# Patient Record
Sex: Female | Born: 1960 | Race: White | Hispanic: No | Marital: Married | Smoking: Former smoker
Health system: Southern US, Community
[De-identification: ages and names within clinical notes are randomized; demographics above are authoritative.]

## PROBLEM LIST (undated history)

## (undated) DIAGNOSIS — K219 Gastro-esophageal reflux disease without esophagitis: Secondary | ICD-10-CM

## (undated) DIAGNOSIS — F32A Depression, unspecified: Secondary | ICD-10-CM

## (undated) DIAGNOSIS — R351 Nocturia: Secondary | ICD-10-CM

## (undated) DIAGNOSIS — E7800 Pure hypercholesterolemia, unspecified: Secondary | ICD-10-CM

## (undated) DIAGNOSIS — C801 Malignant (primary) neoplasm, unspecified: Secondary | ICD-10-CM

## (undated) DIAGNOSIS — R000 Tachycardia, unspecified: Secondary | ICD-10-CM

## (undated) DIAGNOSIS — J449 Chronic obstructive pulmonary disease, unspecified: Secondary | ICD-10-CM

## (undated) DIAGNOSIS — K589 Irritable bowel syndrome without diarrhea: Secondary | ICD-10-CM

## (undated) DIAGNOSIS — J45909 Unspecified asthma, uncomplicated: Secondary | ICD-10-CM

## (undated) DIAGNOSIS — M797 Fibromyalgia: Secondary | ICD-10-CM

## (undated) DIAGNOSIS — M779 Enthesopathy, unspecified: Secondary | ICD-10-CM

## (undated) DIAGNOSIS — F329 Major depressive disorder, single episode, unspecified: Secondary | ICD-10-CM

## (undated) DIAGNOSIS — M1990 Unspecified osteoarthritis, unspecified site: Secondary | ICD-10-CM

## (undated) HISTORY — PX: ABDOMINAL HYSTERECTOMY: SHX81

## (undated) HISTORY — PX: APPENDECTOMY: SHX54

## (undated) HISTORY — DX: Tachycardia, unspecified: R00.0

## (undated) HISTORY — PX: OTHER SURGICAL HISTORY: SHX169

---

## 2002-02-01 ENCOUNTER — Encounter: Payer: Self-pay | Admitting: Oral Surgery

## 2002-02-04 ENCOUNTER — Ambulatory Visit (HOSPITAL_COMMUNITY): Admission: RE | Admit: 2002-02-04 | Discharge: 2002-02-04 | Payer: Self-pay | Admitting: Oral Surgery

## 2004-02-03 ENCOUNTER — Ambulatory Visit: Payer: Self-pay | Admitting: Psychiatry

## 2004-04-09 ENCOUNTER — Ambulatory Visit: Payer: Self-pay | Admitting: Psychiatry

## 2004-07-15 ENCOUNTER — Ambulatory Visit: Payer: Self-pay | Admitting: Psychiatry

## 2004-09-10 ENCOUNTER — Ambulatory Visit: Payer: Self-pay | Admitting: Psychiatry

## 2004-10-12 ENCOUNTER — Ambulatory Visit: Payer: Self-pay | Admitting: Psychiatry

## 2004-11-18 ENCOUNTER — Ambulatory Visit: Payer: Self-pay | Admitting: Psychiatry

## 2004-12-10 ENCOUNTER — Ambulatory Visit: Payer: Self-pay | Admitting: Psychology

## 2005-02-02 ENCOUNTER — Ambulatory Visit: Payer: Self-pay | Admitting: Internal Medicine

## 2005-02-15 ENCOUNTER — Ambulatory Visit: Payer: Self-pay | Admitting: Internal Medicine

## 2005-02-15 ENCOUNTER — Encounter (INDEPENDENT_AMBULATORY_CARE_PROVIDER_SITE_OTHER): Payer: Self-pay | Admitting: Internal Medicine

## 2005-02-15 ENCOUNTER — Ambulatory Visit (HOSPITAL_COMMUNITY): Admission: RE | Admit: 2005-02-15 | Discharge: 2005-02-15 | Payer: Self-pay | Admitting: Internal Medicine

## 2007-08-09 ENCOUNTER — Other Ambulatory Visit: Payer: Self-pay

## 2007-08-10 ENCOUNTER — Inpatient Hospital Stay (HOSPITAL_COMMUNITY): Admission: AD | Admit: 2007-08-10 | Discharge: 2007-08-15 | Payer: Self-pay | Admitting: *Deleted

## 2007-08-13 ENCOUNTER — Ambulatory Visit: Payer: Self-pay | Admitting: *Deleted

## 2009-07-29 ENCOUNTER — Emergency Department (HOSPITAL_COMMUNITY): Admission: EM | Admit: 2009-07-29 | Discharge: 2009-07-29 | Payer: Self-pay | Admitting: Emergency Medicine

## 2010-06-13 ENCOUNTER — Encounter: Payer: Self-pay | Admitting: Family Medicine
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
## 2010-10-08 NOTE — Consult Note (Signed)
Theresa Pace, Theresa Pace                 ACCOUNT NO.:  [id number]   MEDICAL RECORD NO.:  [id number]          PATIENT TYPE:  AMB   LOCATION:                                FACILITY:  [facility]   PHYSICIAN:  Lionel December, M.D.    DATE OF BIRTH:  1961/05/08   DATE OF CONSULTATION:  02/02/2005  DATE OF DISCHARGE:                                   CONSULTATION   GASTROENTEROLOGY CONSULTATION:   CHIEF COMPLAINT:  Abdominal pain, abdominal swelling, blood in stools.   PHYSICIAN REQUESTING CONSULTATION:  Dr. Donzetta Sprung   HISTORY OF PRESENT ILLNESS:  Theresa Pace is a 50 year old Caucasian female who  presents with several-month history of new abdominal pain, abdominal  swelling, change in bowel movements and two episodes of small volume  hematochezia.  She says that she was diagnosed with IBS at age 25.  Her  typical IBS symptoms consist of postprandial loose stools with fecal  urgency.  Used to have a lower abdominal cramping type pain.  A few months  ago she started having mid abdominal to lower abdominal pain which she  describes as dull with sharp intermittent pains.  Pains are worsened by  meals, however, she also has nocturnal pain which wakes her up from sleep.  Now she is having too numerous stools to count.  She wakes up in the morning  having diarrhea.  She rarely has a formed bowel movement.  She continues to  have fecal urgency with incontinence at times.  She has also developed  nausea but no vomiting.  No melena but two episodes of bright red blood per  rectum.  She had a CT of the abdomen which revealed some fatty liver.  Amylase, lipase, LFTs and CBC unremarkable.  She has never been on any  specific medications for her IBS.  She has chronic back pain and chronic  knee pain due to osteoarthritis and is monitored by pain clinic.  Her pain  medications are provided through pain clinic in [facility].  Even though  she is on oxycodone three times a day she continues to have abdominal  pain.   CURRENT MEDICATIONS:  1.  Ventolin inhaler p.r.n.  2.  Advair b.i.d.  3.  Singulair 10 mg daily.  4.  Vytorin daily.  5.  Detrol 4 mg daily.  6.  Nexium 40 mg daily.  7.  Trazodone 100 mg at bedtime.  8.  Xanax 0.5 mg t.i.d.  9.  Oxycodone 5/500 mg t.i.d.  10. Aspirin 81 mg daily.  11. Ibuprofen p.r.n.   ALLERGIES:  No known drug allergies.   PAST MEDICAL HISTORY:  1.  Asthma.  2.  Hypercholesterolemia.  3.  Tachycardia.  4.  Gastroesophageal reflux disease.  5.  IBS.  6.  Fatty infiltration of the liver.  7.  Osteoarthritis of the knees.  8.  Chronic back pain.   PAST SURGICAL HISTORY:  Status post appendectomy and hysterectomy.   FAMILY HISTORY:  Mother died of female cancer age 64 initially diagnosed at  age 92.  She has three aunts and a  grandmother who all died of various  cancers although she does not have the details.  No family history of colon  cancer to her knowledge.   SOCIAL HISTORY:  She is married and has four sons.  She is disabled.  She  stopped smoking 11 years ago.  Denies any alcohol use.   REVIEW OF SYSTEMS:  GI:  See HPI and also has anorexia and abdominal  swelling.  CARDIOPULMONARY:  Has asthma with intermittent flares.  No chest  pain.  GENITOURINARY:  No dysuria or hematuria.  CONSTITUTIONAL:  Complains  of a 10-pound weight loss.   PHYSICAL EXAMINATION:  VITAL SIGNS:  Weight 203-1/2 pounds, height 5 feet 2  inches, temperature 97.9, blood pressure 128/80, pulse 68.  GENERAL:  Pleasant obese Caucasian female in no acute distress.  SKIN:  Warm and dry.  No jaundice.  HEENT:  Conjunctivae are pink.  Sclerae nonicteric.  Oropharyngeal mucosa  moist and pink.  No lesions, erythema or exudate.  No lymphadenopathy or  thyromegaly.  CHEST:  Lungs are clear to auscultation.  Cardiac exam reveals regular rate  and rhythm, normal S1-S2.  No murmurs, rubs, or gallops.  ABDOMEN:  Positive bowel sounds, soft, nondistended.  Obese but  symmetrical.  She has mild tenderness in the lower mid abdomen to deep palpation.  No  organomegaly or masses.  No rebound tenderness or guarding.  No abdominal  bruits or hernias.  EXTREMITIES:  No edema.  RECTAL:  Rectal examination reveals no external lesions.  Secretions are  Hemoccult negative.  She has several small balls of stool in the rectum.   IMPRESSION:  Theresa Pace is a 50 year old Caucasian female with a history of  chronic gastroesophageal reflux disease and chronic irritable bowel syndrome  who presents for further evaluation of worsening of her abdominal pain,  increased diarrhea, nausea, abdominal swelling, two episodes of small volume  hematochezia.  She feels her symptoms are different from her typical  irritable bowel syndrome.  She had an abdominal CT without any significant  findings except for fatty infiltration of the liver.  She has not had to the  best of my knowledge a pelvic CT.  She has never had a colonoscopy.  Her  abdominal pain and diarrhea may very well end up being due to her irritable  bowel syndrome.  Hematochezia needs to be further evaluated and she will  have a colonoscopy in the near future to do this.  Given her chronic  gastroesophageal reflux disease, she needs to have EGD to rule out  complication such as Barrett's esophagus.   PLAN:  1.  Colonoscopy and EGD in the near future.  Consider random sigmoid and/or      small bowel biopsies.  If endoscopic evaluation is unremarkable would      consider small-bowel follow-through and/or pelvic CT to      further evaluate her symptoms.  2.  Trial of Bentyl 20 mg one p.o. t.i.d. (before every meal) p.r.n. (#90)      with one refill.  3.  Further recommendations to follow.      Tana Coast, P.A.      Lionel December, M.D.  Electronically Signed    LL/MEDQ  D:  02/02/2005  T:  02/02/2005  Job:  [id number]   cc:   Donzetta Sprung  Fax: [phone]

## 2010-10-08 NOTE — Op Note (Signed)
Theresa Pace, Theresa Pace                 ACCOUNT NO.:  [id number]   MEDICAL RECORD NO.:  [id number]          PATIENT TYPE:  AMB   LOCATION:  DAY                           FACILITY:  [facility]   PHYSICIAN:  Lionel December, M.D.    DATE OF BIRTH:  1960/09/08   DATE OF PROCEDURE:  02/15/2005  DATE OF DISCHARGE:                                 OPERATIVE REPORT   PROCEDURE:  Esophagogastroduodenoscopy followed by colonoscopy.   INDICATION:  Sham is a 50 year old Caucasian female with multiple medical  problems who has chronic GERD who is undergoing EGD to make sure she does  not have a large hernia or Barrett's esophagus. She has had two episodes of  hematochezia. She also complains of abdominal bloating, pain and change in  her bowel habits with urgency. Most of her symptoms are felt to be due to  IBS. She is undergoing diagnostic EGD and colonoscopy. Procedure risks were  reviewed with the patient, and informed consent was obtained.   PREMEDICATION:  Cetacaine spray for pharyngeal topical anesthesia, Demerol  50 mg IV, Versed 20 mg IV in divided dose.   FINDINGS:  Procedure performed in endoscopy suite. The patient's vital signs  and O2 saturation were monitored during the procedure and remained stable.   PROCEDURE #1:  Esophagogastroduodenoscopy. The patient was placed in left  lateral position and Olympus videoscope was passed via oropharynx without  any difficulty into esophagus.   Esophagus. Mucosa of the esophagus was normal throughout. Squamocolumnar  junction was at 38 cm from the incisors and was normal.   Stomach. It was empty and distended very well insufflation. Folds of  proximal stomach were normal. Examination of mucosa revealed few antral  erosions and a 6 to 7-mm flat discoid lesion with erosion. This was a biopsy  to make sure this was not an adenoma. Pyloric channel was patent. Angularis,  fundus and cardia were examined by retroflexing the scope and were normal.   Duodenum. Bulbar mucosa revealed patchy edema and erythema and erosions, but  no ulcer crater was found. Scope was passed to the second part of duodenum.  The mucosa and folds were normal. Endoscope was withdrawn, and the patient  prepared for procedure #2.   Colonoscopy. Rectal examination performed. No abnormality noted on external  or digital exam. Olympus videoscope was placed in rectum and advanced under  vision into sigmoid colon and beyond. Preparation was satisfactory. Scope  was passed into cecum which was identified by ileocecal valve and  appendiceal orifice, and pictures taken for the record. As the scope was  withdrawn, colonic mucosa was carefully examined and was normal throughout.  Rectal mucosa similarly was normal. Scope was retroflexed to examine  anorectal junction, and small hemorrhoids were noted below the dentate line.  Endoscope was straightened and withdrawn. The patient tolerated the  procedure well.   FINAL DIAGNOSIS:  1.  Erosive antral gastritis with duodenitis.  2.  Small discoid lesion at antrum with central erosion. Biopsy taken for      histology. This could be a small leiomyoma with erosion or a  hyperplastic polyp.  3.  No evidence of Barrett's esophagus.  4.  Normal colonoscopy except external hemorrhoids.  5.  Suspect most of her symptoms are due to IBS.   RECOMMENDATIONS:  1.  She will continue anti-reflux measures and Nexium as before.  2.  H pylori serology will be checked today.  3.  FiberChoice 2 tablets q.d.  4.  I will be contacting the patient with biopsy results and further      recommendations.      Lionel December, M.D.  Electronically Signed     NR/MEDQ  D:  02/15/2005  T:  02/16/2005  Job:  [id number]   cc:   Donzetta Sprung  Fax: [phone]

## 2010-10-08 NOTE — Op Note (Signed)
   Theresa Pace, Theresa Pace                             ACCOUNT NO.:  [id number]   MEDICAL RECORD NO.:  [id number]                   PATIENT TYPE:  OIB   LOCATION:  2899                                 FACILITY:  [facility]   PHYSICIAN:  Sable Feil., D.D.S.        DATE OF BIRTH:  08/25/1960   DATE OF PROCEDURE:  02/04/2002  DATE OF DISCHARGE:                                 OPERATIVE REPORT   PREOPERATIVE DIAGNOSES:  1. Severe generalized periodontal disease.  2. Multiple abscessed teeth.  3. Dental caries.   POSTOPERATIVE DIAGNOSES:  1. Severe generalized periodontal disease.  2. Multiple abscessed teeth.  3. Dental caries.   PROCEDURE:  Full-mouth dental extractions, including teeth #2, 3, 4, 5, 6,  7, 8, 9, 10, 11, 12, 13, 14, 15, 22, 23, 24, 25, 26, 27, 28, 30, and 31.   DESCRIPTION OF PROCEDURE:  The patient was brought to the operating room in  satisfactory preoperative condition and placed on the operating room table  in the supine position.  Following successful induction of general  anesthesia via oroendotracheal intubation, the patient was prepped and  draped in the usual sterile fashion for a procedure of this type.  Next the  oral cavity was thoroughly irrigated with a normal saline solution.  An  oropharyngeal throat pack was then placed.  Following this, the maxillary  and mandibular soft tissues were injected with 2% lidocaine solution with  1:100,000 epinephrine, approximately 7 cc of local anesthetic was injected  in total.  Next the teeth were removed with surgical technique utilizing a  150 and 151 forceps.  Teeth #2-15, 22-28, 30 and 31 were then surgically  removed.  All extraction sites were thoroughly irrigated with normal saline.  This essentially completed the procedure.  All sponge, needle, and  instrument counts were correct at the conclusion of the case.   SURGEON:  Lovena Le, D.D.S.                                               Sable Feil., D.D.S.    JWB/MEDQ  D:  02/04/2002  T:  02/04/2002  Job:  [id number]

## 2011-02-14 LAB — BASIC METABOLIC PANEL
BUN: 16
CO2: 26
Calcium: 9.6
Chloride: 103
Creatinine, Ser: 0.85
GFR calc Af Amer: >60
GFR calc non Af Amer: >60
Glucose, Bld: 123 — ABNORMAL HIGH
Potassium: 3.9
Sodium: 138

## 2011-02-14 LAB — URINALYSIS, ROUTINE W REFLEX MICROSCOPIC
Glucose, UA: NEGATIVE
Nitrite: NEGATIVE
Specific Gravity, Urine: >1.03 — ABNORMAL HIGH
Urobilinogen, UA: 0.2
pH: 5.5

## 2011-02-14 LAB — DIFFERENTIAL
Basophils Absolute: 0
Basophils Relative: 0
Eosinophils Absolute: 0.1
Eosinophils Relative: 1
Lymphocytes Relative: 23
Lymphs Abs: 2.7
Monocytes Absolute: 0.4
Monocytes Relative: 4
Neutro Abs: 8.6 — ABNORMAL HIGH
Neutrophils Relative %: 73

## 2011-02-14 LAB — CBC
HCT: 45.6
Hemoglobin: 15.7 — ABNORMAL HIGH
MCHC: 34.4
MCV: 88.9
Platelets: 331
RBC: 5.13 — ABNORMAL HIGH
RDW: 13.4
WBC: 11.9 — ABNORMAL HIGH

## 2011-02-14 LAB — URINE MICROSCOPIC-ADD ON

## 2011-02-14 LAB — RAPID URINE DRUG SCREEN, HOSP PERFORMED
Amphetamines: POSITIVE — AB
Barbiturates: NOT DETECTED
Benzodiazepines: POSITIVE — AB
Cocaine: NOT DETECTED
Opiates: POSITIVE — AB
Tetrahydrocannabinol: NOT DETECTED

## 2011-02-14 LAB — HEMOGLOBIN A1C: Hgb A1c MFr Bld: 6.1

## 2011-11-21 ENCOUNTER — Encounter: Payer: Self-pay | Admitting: Internal Medicine

## 2012-02-20 ENCOUNTER — Other Ambulatory Visit: Payer: Self-pay

## 2012-02-20 ENCOUNTER — Encounter (HOSPITAL_COMMUNITY): Payer: Self-pay | Admitting: Emergency Medicine

## 2012-02-20 ENCOUNTER — Inpatient Hospital Stay (HOSPITAL_COMMUNITY)
Admission: EM | Admit: 2012-02-20 | Discharge: 2012-02-22 | DRG: 100 | Disposition: A | Discharge status: Home / Self Care | Payer: Medicaid Other | Attending: Internal Medicine | Admitting: Internal Medicine

## 2012-02-20 DIAGNOSIS — K649 Unspecified hemorrhoids: Secondary | ICD-10-CM | POA: Diagnosis present

## 2012-02-20 DIAGNOSIS — M549 Dorsalgia, unspecified: Secondary | ICD-10-CM | POA: Diagnosis present

## 2012-02-20 DIAGNOSIS — T428X5A Adverse effect of antiparkinsonism drugs and other central muscle-tone depressants, initial encounter: Secondary | ICD-10-CM | POA: Diagnosis present

## 2012-02-20 DIAGNOSIS — G9340 Encephalopathy, unspecified: Secondary | ICD-10-CM | POA: Diagnosis present

## 2012-02-20 DIAGNOSIS — Z886 Allergy status to analgesic agent status: Secondary | ICD-10-CM

## 2012-02-20 DIAGNOSIS — F329 Major depressive disorder, single episode, unspecified: Secondary | ICD-10-CM | POA: Diagnosis present

## 2012-02-20 DIAGNOSIS — R32 Unspecified urinary incontinence: Secondary | ICD-10-CM | POA: Diagnosis present

## 2012-02-20 DIAGNOSIS — R4182 Altered mental status, unspecified: Secondary | ICD-10-CM

## 2012-02-20 DIAGNOSIS — F32A Depression, unspecified: Secondary | ICD-10-CM | POA: Diagnosis present

## 2012-02-20 DIAGNOSIS — R569 Unspecified convulsions: Principal | ICD-10-CM | POA: Diagnosis present

## 2012-02-20 DIAGNOSIS — K219 Gastro-esophageal reflux disease without esophagitis: Secondary | ICD-10-CM | POA: Diagnosis present

## 2012-02-20 DIAGNOSIS — F3289 Other specified depressive episodes: Secondary | ICD-10-CM | POA: Diagnosis present

## 2012-02-20 DIAGNOSIS — E7800 Pure hypercholesterolemia, unspecified: Secondary | ICD-10-CM | POA: Diagnosis present

## 2012-02-20 DIAGNOSIS — J45909 Unspecified asthma, uncomplicated: Secondary | ICD-10-CM | POA: Diagnosis present

## 2012-02-20 DIAGNOSIS — Y92009 Unspecified place in unspecified non-institutional (private) residence as the place of occurrence of the external cause: Secondary | ICD-10-CM

## 2012-02-20 DIAGNOSIS — F17200 Nicotine dependence, unspecified, uncomplicated: Secondary | ICD-10-CM | POA: Diagnosis present

## 2012-02-20 DIAGNOSIS — G8929 Other chronic pain: Secondary | ICD-10-CM | POA: Diagnosis present

## 2012-02-20 DIAGNOSIS — K589 Irritable bowel syndrome without diarrhea: Secondary | ICD-10-CM | POA: Diagnosis present

## 2012-02-20 HISTORY — DX: Major depressive disorder, single episode, unspecified: F32.9

## 2012-02-20 HISTORY — DX: Gastro-esophageal reflux disease without esophagitis: K21.9

## 2012-02-20 HISTORY — DX: Depression, unspecified: F32.A

## 2012-02-20 HISTORY — DX: Unspecified asthma, uncomplicated: J45.909

## 2012-02-20 HISTORY — DX: Pure hypercholesterolemia, unspecified: E78.00

## 2012-02-20 HISTORY — DX: Nocturia: R35.1

## 2012-02-20 HISTORY — DX: Irritable bowel syndrome, unspecified: K58.9

## 2012-02-20 NOTE — ED Notes (Signed)
Patient with altered mental status. Patient unable to answer questions at this time. Patient repeating name, shaking, eyes dilated and continuously moving.

## 2012-02-21 ENCOUNTER — Inpatient Hospital Stay (HOSPITAL_COMMUNITY)
Admit: 2012-02-21 | Discharge: 2012-02-21 | Disposition: A | Discharge status: Home / Self Care | Payer: Medicaid Other | Attending: Internal Medicine | Admitting: Internal Medicine

## 2012-02-21 ENCOUNTER — Emergency Department (HOSPITAL_COMMUNITY): Payer: Medicaid Other

## 2012-02-21 ENCOUNTER — Encounter (HOSPITAL_COMMUNITY): Payer: Self-pay | Admitting: Emergency Medicine

## 2012-02-21 ENCOUNTER — Inpatient Hospital Stay (HOSPITAL_COMMUNITY): Payer: Medicaid Other

## 2012-02-21 DIAGNOSIS — J45909 Unspecified asthma, uncomplicated: Secondary | ICD-10-CM

## 2012-02-21 DIAGNOSIS — F329 Major depressive disorder, single episode, unspecified: Secondary | ICD-10-CM

## 2012-02-21 DIAGNOSIS — R4182 Altered mental status, unspecified: Secondary | ICD-10-CM

## 2012-02-21 DIAGNOSIS — G9340 Encephalopathy, unspecified: Secondary | ICD-10-CM

## 2012-02-21 DIAGNOSIS — R569 Unspecified convulsions: Principal | ICD-10-CM

## 2012-02-21 LAB — CBC
HCT: 47.6 % — ABNORMAL HIGH (ref 36.0–46.0)
Hemoglobin: 16.4 g/dL — ABNORMAL HIGH (ref 12.0–15.0)
MCH: 31.6 pg (ref 26.0–34.0)
MCHC: 34.5 g/dL (ref 30.0–36.0)
MCV: 91.7 fL (ref 78.0–100.0)
RBC: 5.19 MIL/uL — ABNORMAL HIGH (ref 3.87–5.11)

## 2012-02-21 LAB — ETHANOL: Alcohol, Ethyl (B): <11 mg/dL (ref 0–11)

## 2012-02-21 LAB — PROLACTIN: Prolactin: 4.5 ng/mL

## 2012-02-21 LAB — HEPATIC FUNCTION PANEL
ALT: 10 U/L (ref 0–35)
Alkaline Phosphatase: 107 U/L (ref 39–117)
Total Bilirubin: 0.4 mg/dL (ref 0.3–1.2)
Total Protein: 7 g/dL (ref 6.0–8.3)

## 2012-02-21 LAB — RAPID URINE DRUG SCREEN, HOSP PERFORMED
Amphetamines: POSITIVE — AB
Barbiturates: NOT DETECTED
Benzodiazepines: POSITIVE — AB
Tetrahydrocannabinol: NOT DETECTED

## 2012-02-21 LAB — URINALYSIS, MICROSCOPIC ONLY
Glucose, UA: NEGATIVE mg/dL
Ketones, ur: NEGATIVE mg/dL
Leukocytes, UA: NEGATIVE
Nitrite: NEGATIVE
Protein, ur: NEGATIVE mg/dL
Urobilinogen, UA: 0.2 mg/dL (ref 0.0–1.0)

## 2012-02-21 LAB — BASIC METABOLIC PANEL
BUN: 15 mg/dL (ref 6–23)
CO2: 21 mEq/L (ref 19–32)
GFR calc non Af Amer: >90 mL/min (ref >90)
Glucose, Bld: 114 mg/dL — ABNORMAL HIGH (ref 70–99)
Potassium: 3.6 mEq/L (ref 3.5–5.1)
Sodium: 141 mEq/L (ref 135–145)

## 2012-02-21 MED ORDER — DULOXETINE HCL 30 MG PO CPEP
30.0000 mg | ORAL_CAPSULE | Freq: Every day | ORAL | Status: DC
  Administered 2012-02-21 – 2012-02-22 (×2): 30 mg via ORAL
  Filled 2012-02-21 (×4): qty 1

## 2012-02-21 MED ORDER — ENOXAPARIN SODIUM 40 MG/0.4ML ~~LOC~~ SOLN
40.0000 mg | SUBCUTANEOUS | Status: DC
  Administered 2012-02-21 – 2012-02-22 (×2): 40 mg via SUBCUTANEOUS
  Filled 2012-02-21 (×2): qty 0.4

## 2012-02-21 MED ORDER — ACETAMINOPHEN 325 MG PO TABS
650.0000 mg | ORAL_TABLET | Freq: Four times a day (QID) | ORAL | Status: DC | PRN
  Administered 2012-02-21 (×2): 650 mg via ORAL
  Filled 2012-02-21 (×2): qty 2

## 2012-02-21 MED ORDER — PNEUMOCOCCAL VAC POLYVALENT 25 MCG/0.5ML IJ INJ
0.5000 mL | INJECTION | INTRAMUSCULAR | Status: AC
  Administered 2012-02-22: 0.5 mL via INTRAMUSCULAR
  Filled 2012-02-21 (×2): qty 0.5

## 2012-02-21 MED ORDER — THIAMINE HCL 100 MG/ML IJ SOLN
100.0000 mg | Freq: Every day | INTRAMUSCULAR | Status: DC
  Administered 2012-02-21 – 2012-02-22 (×2): 100 mg via INTRAVENOUS
  Filled 2012-02-21 (×2): qty 2

## 2012-02-21 MED ORDER — INFLUENZA VIRUS VACC SPLIT PF IM SUSP
0.5000 mL | INTRAMUSCULAR | Status: AC
  Administered 2012-02-22: 0.5 mL via INTRAMUSCULAR
  Filled 2012-02-21: qty 0.5

## 2012-02-21 MED ORDER — ONDANSETRON HCL 4 MG/2ML IJ SOLN: 4.0000 mg | Freq: Four times a day (QID) | INTRAMUSCULAR | Status: DC | PRN

## 2012-02-21 MED ORDER — FLUTICASONE-SALMETEROL 250-50 MCG/DOSE IN AEPB
1.0000 | INHALATION_SPRAY | Freq: Two times a day (BID) | RESPIRATORY_TRACT | Status: DC
  Administered 2012-02-21 – 2012-02-22 (×3): 1 via RESPIRATORY_TRACT
  Filled 2012-02-21: qty 14

## 2012-02-21 MED ORDER — ATORVASTATIN CALCIUM 40 MG PO TABS
40.0000 mg | ORAL_TABLET | Freq: Every day | ORAL | Status: DC
  Administered 2012-02-21: 40 mg via ORAL
  Filled 2012-02-21: qty 1

## 2012-02-21 MED ORDER — ALPRAZOLAM 0.5 MG PO TABS
0.5000 mg | ORAL_TABLET | Freq: Three times a day (TID) | ORAL | Status: DC | PRN
  Administered 2012-02-21 – 2012-02-22 (×2): 0.5 mg via ORAL
  Filled 2012-02-21 (×2): qty 1

## 2012-02-21 MED ORDER — ALBUTEROL SULFATE HFA 108 (90 BASE) MCG/ACT IN AERS: 2.0000 | INHALATION_SPRAY | Freq: Four times a day (QID) | RESPIRATORY_TRACT | Status: DC | PRN

## 2012-02-21 MED ORDER — PANTOPRAZOLE SODIUM 40 MG PO TBEC
40.0000 mg | DELAYED_RELEASE_TABLET | Freq: Every day | ORAL | Status: DC
  Administered 2012-02-21 – 2012-02-22 (×2): 40 mg via ORAL
  Filled 2012-02-21 (×2): qty 1

## 2012-02-21 MED ORDER — ACETAMINOPHEN 650 MG RE SUPP: 650.0000 mg | Freq: Four times a day (QID) | RECTAL | Status: DC | PRN

## 2012-02-21 MED ORDER — SODIUM CHLORIDE 0.9 % IV BOLUS (SEPSIS)
500.0000 mL | Freq: Once | INTRAVENOUS | Status: AC
  Administered 2012-02-22: 500 mL via INTRAVENOUS

## 2012-02-21 MED ORDER — SODIUM CHLORIDE 0.9 % IJ SOLN
3.0000 mL | Freq: Two times a day (BID) | INTRAMUSCULAR | Status: DC
  Administered 2012-02-21 – 2012-02-22 (×3): 3 mL via INTRAVENOUS
  Filled 2012-02-21 (×3): qty 3

## 2012-02-21 MED ORDER — ONDANSETRON HCL 4 MG PO TABS: 4.0000 mg | ORAL_TABLET | Freq: Four times a day (QID) | ORAL | Status: DC | PRN

## 2012-02-21 MED ORDER — IBUPROFEN 600 MG PO TABS
600.0000 mg | ORAL_TABLET | Freq: Once | ORAL | Status: AC
  Administered 2012-02-21: 600 mg via ORAL
  Filled 2012-02-21: qty 1

## 2012-02-21 MED ORDER — METHOCARBAMOL 500 MG PO TABS
750.0000 mg | ORAL_TABLET | Freq: Four times a day (QID) | ORAL | Status: DC
  Administered 2012-02-21: 750 mg via ORAL
  Filled 2012-02-21 (×2): qty 2

## 2012-02-21 MED ORDER — ALBUTEROL SULFATE (5 MG/ML) 0.5% IN NEBU
2.5000 mg | INHALATION_SOLUTION | RESPIRATORY_TRACT | Status: DC | PRN
  Filled 2012-02-21: qty 0.5

## 2012-02-21 MED ORDER — SODIUM CHLORIDE 0.9 % IV SOLN
INTRAVENOUS | Status: AC
  Administered 2012-02-21: 10:00:00 via INTRAVENOUS

## 2012-02-21 NOTE — ED Notes (Signed)
Assessed patient's orientation again. Patient able to tell me she is in the hospital when I assessed orientation to place. Patient able to tell me name, stated age was 70. Patient disoriented to time and situation.

## 2012-02-21 NOTE — H&P (Signed)
Theresa Pace is an 51 y.o. female.    PCP: Unknown  Chief Complaint: Altered mental status, possible seizure  HPI: This is a 51 year old, Caucasian female, with a past medical history of depression and possibly bipolar disorder, as well as asthma, who was in her usual state of health till sometime last night when she was found by her husband to be shaking. He felt that the patient had a seizure. He, subsequently called EMS and the patient was brought into the hospital. Patient was monitored in the ED and initially, it was felt that she had a post ictal state. However, her confusion persisted for many hours and, so, we were called for further evaluation. Unfortunately, patient's husband cannot be traced at this time. Patient is unable to answer to many questions. She complains of low back pain, which she tells me is chronic for her. She denies any chest pain. Denies headache. No recent history of fever or chills. No history of falls. She denies taking none more than usual quantities of her home medications. History is very limited at this time   Home Medications: Prior to Admission medications   Medication Sig Start Date End Date Taking? Authorizing Provider  albuterol (PROVENTIL HFA;VENTOLIN HFA) 108 (90 BASE) MCG/ACT inhaler Inhale 2 puffs into the lungs every 6 (six) hours as needed.   Yes Historical Provider, MD  baclofen (LIORESAL) 20 MG tablet Take 20 mg by mouth 3 (three) times daily.   Yes Historical Provider, MD  DULoxetine (CYMBALTA) 30 MG capsule Take 30 mg by mouth daily.   Yes Historical Provider, MD  Fluticasone-Salmeterol (ADVAIR) 250-50 MCG/DOSE AEPB Inhale 1 puff into the lungs every 12 (twelve) hours.   Yes Historical Provider, MD  loratadine (CLARITIN) 10 MG tablet Take 10 mg by mouth daily.   Yes Historical Provider, MD  montelukast (SINGULAIR) 10 MG tablet Take 10 mg by mouth at bedtime.   Yes Historical Provider, MD  omeprazole (PRILOSEC) 20 MG capsule Take 20 mg by mouth  daily.   Yes Historical Provider, MD  ondansetron (ZOFRAN) 4 MG tablet Take 4 mg by mouth every 8 (eight) hours as needed.   Yes Historical Provider, MD  QUEtiapine (SEROQUEL XR) 200 MG 24 hr tablet Take 150 mg by mouth at bedtime.   Yes Historical Provider, MD  rosuvastatin (CRESTOR) 20 MG tablet Take 20 mg by mouth daily.   Yes Historical Provider, MD  traZODone (DESYREL) 50 MG tablet Take 100 mg by mouth at bedtime.   Yes Historical Provider, MD    Allergies:  Allergies  Allergen Reactions  . Aspirin Shortness Of Breath and Rash    Past Medical History: Past Medical History  Diagnosis Date  . Asthma   . Depression   . Frequent urination at night   . IBS (irritable bowel syndrome)   . GERD (gastroesophageal reflux disease)   . Hypercholesteremia     Past Surgical History  Procedure Date  . Abdominal hysterectomy     Social History:  reports that she has been smoking.  She does not have any smokeless tobacco history on file. She reports that she drinks alcohol. She reports that she does not use illicit drugs.  Family History: Unable to obtain due to altered mental status  Review of Systems -unable to do due to altered mental status  Physical Examination Blood pressure 155/98, pulse 80, temperature 98.3 F (36.8 C), temperature source Oral, resp. rate 20, height 5\' 2"  (1.575 m), weight 70.308 kg (155 lb),  SpO2 99.00%.  General appearance: alert, distracted, no distress and slowed mentation Head: Normocephalic, without obvious abnormality, atraumatic Eyes: conjunctivae/corneas clear. PERRL, EOM's intact.  Throat: lips, mucosa, and tongue normal; teeth and gums normal Neck: no adenopathy, no carotid bruit, no JVD, supple, symmetrical, trachea midline and thyroid not enlarged, symmetric, no tenderness/mass/nodules Back: symmetric, no curvature. ROM normal. No CVA tenderness. Resp: Few scattered wheezes bilaterally. No crackles Cardio: regular rate and rhythm, S1, S2 normal,  no murmur, click, rub or gallop GI: soft, non-tender; bowel sounds normal; no masses,  no organomegaly Extremities: extremities normal, atraumatic, no cyanosis or edema Pulses: 2+ and symmetric Skin: Skin color, texture, turgor normal. No rashes or lesions Lymph nodes: Cervical, supraclavicular, and axillary nodes normal. Neurologic: She is alert. She was able to tell me that she was in the hospital. She was able to name the hospital. She did not know today's date. She does not have any cranial nerve deficits. Motor strength is equal bilaterally. Gait was not assessed  Laboratory Data: Results for orders placed during the hospital encounter of 02/20/12 (from the past 48 hour(s))  BASIC METABOLIC PANEL     Status: Abnormal   Collection Time   02/20/12 11:54 PM      Component Value Range Comment   Sodium 141  135 - 145 mEq/L    Potassium 3.6  3.5 - 5.1 mEq/L    Chloride 108  96 - 112 mEq/L    CO2 21  19 - 32 mEq/L    Glucose, Bld 114 (*) 70 - 99 mg/dL    BUN 15  6 - 23 mg/dL    Creatinine, Ser 1.61  0.50 - 1.10 mg/dL    Calcium 9.6  8.4 - 09.6 mg/dL    GFR calc non Af Amer >90  >90 mL/min    GFR calc Af Amer >90  >90 mL/min   CBC     Status: Abnormal   Collection Time   02/20/12 11:54 PM      Component Value Range Comment   WBC 10.5  4.0 - 10.5 K/uL    RBC 5.19 (*) 3.87 - 5.11 MIL/uL    Hemoglobin 16.4 (*) 12.0 - 15.0 g/dL    HCT 04.5 (*) 40.9 - 46.0 %    MCV 91.7  78.0 - 100.0 fL    MCH 31.6  26.0 - 34.0 pg    MCHC 34.5  30.0 - 36.0 g/dL    RDW 81.1  91.4 - 78.2 %    Platelets 243  150 - 400 K/uL   ETHANOL     Status: Normal   Collection Time   02/20/12 11:54 PM      Component Value Range Comment   Alcohol, Ethyl (B) <11  0 - 11 mg/dL   URINE RAPID DRUG SCREEN (HOSP PERFORMED)     Status: Abnormal   Collection Time   02/21/12 12:55 AM      Component Value Range Comment   Opiates NONE DETECTED  NONE DETECTED    Cocaine NONE DETECTED  NONE DETECTED    Benzodiazepines POSITIVE  (*) NONE DETECTED    Amphetamines POSITIVE (*) NONE DETECTED    Tetrahydrocannabinol NONE DETECTED  NONE DETECTED    Barbiturates NONE DETECTED  NONE DETECTED   URINALYSIS, MICROSCOPIC ONLY     Status: Abnormal   Collection Time   02/21/12 12:55 AM      Component Value Range Comment   Color, Urine YELLOW  YELLOW    APPearance  CLEAR  CLEAR    Specific Gravity, Urine <1.005 (*) 1.005 - 1.030    pH 6.0  5.0 - 8.0    Glucose, UA NEGATIVE  NEGATIVE mg/dL    Hgb urine dipstick SMALL (*) NEGATIVE    Bilirubin Urine NEGATIVE  NEGATIVE    Ketones, ur NEGATIVE  NEGATIVE mg/dL    Protein, ur NEGATIVE  NEGATIVE mg/dL    Urobilinogen, UA 0.2  0.0 - 1.0 mg/dL    Nitrite NEGATIVE  NEGATIVE    Leukocytes, UA NEGATIVE  NEGATIVE    WBC, UA 0-2  <3 WBC/hpf    RBC / HPF 0-2  <3 RBC/hpf    Squamous Epithelial / LPF RARE  RARE   HEPATIC FUNCTION PANEL     Status: Normal   Collection Time   02/21/12  1:33 AM      Component Value Range Comment   Total Protein 7.0  6.0 - 8.3 g/dL    Albumin 3.8  3.5 - 5.2 g/dL    AST 10  0 - 37 U/L    ALT 10  0 - 35 U/L    Alkaline Phosphatase 107  39 - 117 U/L    Total Bilirubin 0.4  0.3 - 1.2 mg/dL    Bilirubin, Direct <9.6  0.0 - 0.3 mg/dL    Indirect Bilirubin NOT CALCULATED  0.3 - 0.9 mg/dL   AMMONIA     Status: Normal   Collection Time   02/21/12  1:34 AM      Component Value Range Comment   Ammonia 21  11 - 60 umol/L     Radiology Reports: Dg Chest 1 View  02/21/2012  *RADIOLOGY REPORT*  Clinical Data: Altered mental status.  Seizure.  CHEST - 1 VIEW  Comparison: 01/20/2012  Findings: The lungs are clear without focal infiltrate, edema, pneumothorax or pleural effusion. Interstitial markings are diffusely coarsened with chronic features. Cardiopericardial silhouette is at upper limits of normal for size. Imaged bony structures of the thorax are intact. Telemetry leads overlie the chest.  IMPRESSION: Underlying chronic interstitial coarsening.  No acute  cardiopulmonary process.   Original Report Authenticated By: ERIC A. MANSELL, M.D.    Ct Head Wo Contrast  02/21/2012  *RADIOLOGY REPORT*  Clinical Data: Altered mental status.  Seizure activity.  CT HEAD WITHOUT CONTRAST  Technique:  Contiguous axial images were obtained from the base of the skull through the vertex without contrast.  Comparison: None.  Findings: There is no evidence for acute hemorrhage, hydrocephalus, mass lesion, or abnormal extra-axial fluid collection.  No definite CT evidence for acute infarction.  The visualized paranasal sinuses and mastoid air cells are clear.  IMPRESSION: Normal exam.   Original Report Authenticated By: ERIC A. MANSELL, M.D.     Electrocardiogram: Poor quality EKG, difficult to interpret.  Assessment/Plan  Principal Problem:  *Acute encephalopathy Active Problems:  Asthma  Depression   #1 acute encephalopathy: She may have had a seizure and this could be a prolonged postictal state. These findings could also be secondary to medications, that she is on at home. We will get an EEG. We'll get MRI of the brain. Observe her in the hospital overnight. Seizure precautions will be utilized. There is no obvious infectious source for her symptoms and presentation. No metabolic source either. We will hold some of her home medications.  #2 history of asthma: She does have a cough. However, x-ray is clear. Continue to monitor.  DVT, prophylaxis will be initiated.  She is a  full code.  Further management and decisions will depend on results of further testing and patient's response to treatment.   [facility]  [facility] Pager [phone]  02/21/2012, 5:24 AM

## 2012-02-21 NOTE — Evaluation (Signed)
Occupational Therapy Evaluation Patient Details Name: Theresa Pace MRN: [id number] DOB: 03/22/1961 Today's Date: 02/21/2012 Time: 1355-1410 OT Time Calculation (min): 15 min  OT Assessment / Plan / Recommendation Clinical Impression  Patient is a 51 y/o female s/p Acute Encephalopathy presenting to acute OT with all education complete. Patient is functioning at baseline and does not require OT services at this time; will sign off.    OT Assessment  Patient does not need any further OT services    Follow Up Recommendations  No OT follow up       Equipment Recommendations  None recommended by OT          Precautions / Restrictions Precautions Precautions: None Restrictions Weight Bearing Restrictions: No   Pertinent Vitals/Pain Patient reports 10/10 pain level in back. Nursing aware.    ADL  Lower Body Dressing: Independent Where Assessed - Lower Body Dressing: Unsupported sit to stand Toilet Transfer: Independent Toilet Transfer Method: Other (comment) (ambulating) Toilet Transfer Equipment: Regular height toilet Toileting - Clothing Manipulation and Hygiene: Independent Where Assessed - Toileting Clothing Manipulation and Hygiene: Standing Transfers/Ambulation Related to ADLs: Patient transfers at Independent level; no AD.      Visit Information  Last OT Received On: 02/21/12 Assistance Needed: +1    Subjective Data  Subjective: "My back hurts." Patient Stated Goal: To go home.   Prior Functioning     Home Living Lives With: Spouse Prior Function Level of Independence: Independent Driving: Yes Vocation: On disability Communication Communication: No difficulties Dominant Hand: Right            Cognition  Overall Cognitive Status: Appears within functional limits for tasks assessed/performed Arousal/Alertness: Awake/alert Orientation Level: Appears intact for tasks assessed Behavior During Session: [facility] for tasks performed    Extremity/Trunk  Assessment Right Upper Extremity Assessment RUE ROM/Strength/Tone: WFL for tasks assessed RUE Sensation: WFL - Light Touch;WFL - Proprioception RUE Coordination: WFL - gross/fine motor Left Upper Extremity Assessment LUE ROM/Strength/Tone: WFL for tasks assessed LUE Sensation: WFL - Light Touch;WFL - Proprioception LUE Coordination: WFL - gross/fine motor     Mobility Bed Mobility Bed Mobility: Supine to Sit;Sit to Supine Supine to Sit: 7: Independent Sit to Supine: 7: Independent Transfers Sit to Stand: 7: Independent Stand to Sit: 7: Independent           Balance Balance Balance Assessed: No (WNL by observation)   End of Session OT - End of Session Activity Tolerance: Patient tolerated treatment well Patient left: in bed;with call bell/phone within reach;with nursing in room Nurse Communication: Patient requests pain meds;Mobility status   Limmie Patricia, OTR/L 02/21/2012, 2:28 PM

## 2012-02-21 NOTE — ED Provider Notes (Addendum)
History     CSN: [id number]  Arrival date & time 02/20/12  2345   First MD Initiated Contact with Patient 02/20/12 2350    Chief Complaint  Patient presents with  . Altered Mental Status    (Consider location/radiation/quality/duration/timing/severity/associated sxs/prior treatment) HPI Level 5 Caveat: altered mental status. This is a 51 year old white female who was found by her husband had a generalized tonic clonic movement while sitting on the sofa at home. This occurred just prior to arrival. She has no history of seizures. This lasted for several minutes and was followed by confusion which persists. She was able to tell nursing staff her name on arrival but did not know where she was, what day it was, and gave her age as just 89. She was incontinent of urine. On arrival she was fidgety and repeating her name although this has improved. Her husband states she has chronic abdominal pain.  Past Medical History  Diagnosis Date  . Asthma   . Depression   . Frequent urination at night   . IBS (irritable bowel syndrome)   . GERD (gastroesophageal reflux disease)   . Hypercholesteremia     Past Surgical History  Procedure Date  . Abdominal hysterectomy     History reviewed. No pertinent family history.  History  Substance Use Topics  . Smoking status: Current Every Day Smoker  . Smokeless tobacco: Not on file  . Alcohol Use: Yes     sledom per husband    OB History    Grav Para Term Preterm Abortions TAB SAB Ect Mult Living                  Review of Systems  Unable to perform ROS   Allergies  Aspirin  Home Medications   Current Outpatient Rx  Name Route Sig Dispense Refill  . ALBUTEROL SULFATE HFA 108 (90 BASE) MCG/ACT IN AERS Inhalation Inhale 2 puffs into the lungs every 6 (six) hours as needed.    Marland Kitchen BACLOFEN 20 MG PO TABS Oral Take 20 mg by mouth 3 (three) times daily.    . DULOXETINE HCL 30 MG PO CPEP Oral Take 30 mg by mouth daily.    Marland Kitchen  FLUTICASONE-SALMETEROL 250-50 MCG/DOSE IN AEPB Inhalation Inhale 1 puff into the lungs every 12 (twelve) hours.    Marland Kitchen LORATADINE 10 MG PO TABS Oral Take 10 mg by mouth daily.    Marland Kitchen MONTELUKAST SODIUM 10 MG PO TABS Oral Take 10 mg by mouth at bedtime.    . OMEPRAZOLE 20 MG PO CPDR Oral Take 20 mg by mouth daily.    Marland Kitchen ONDANSETRON HCL 4 MG PO TABS Oral Take 4 mg by mouth every 8 (eight) hours as needed.    Marland Kitchen QUETIAPINE FUMARATE ER 200 MG PO TB24 Oral Take 150 mg by mouth at bedtime.    Marland Kitchen ROSUVASTATIN CALCIUM 20 MG PO TABS Oral Take 20 mg by mouth daily.    . TRAZODONE HCL 50 MG PO TABS Oral Take 100 mg by mouth at bedtime.      BP 152/75  Pulse 60  Temp 98.3 F (36.8 C) (Oral)  Resp 16  Ht 5\' 2"  (1.575 m)  Wt 155 lb (70.308 kg)  BMI 28.35 kg/m2  SpO2 96%  Physical Exam General: Well-developed, well-nourished female in no acute distress; appearance consistent with age of record HENT: normocephalic, atraumatic Eyes: pupils equal round and reactive to light; extraocular muscles intact Neck: supple Heart: regular rate and rhythm; normal  sinus rhythm on monitor with occasional ectopy Lungs: clear to auscultation bilaterally Abdomen: soft; nondistended; mild generalized tenderness; no masses or hepatosplenomegaly; bowel sounds present Extremities: No deformity; full range of motion Neurologic: Awake, alert, can get her name, knows she is in a hospital but cannot name which one, not oriented to time or date, not oriented to her age; motor function intact in all extremities and symmetric but with asterixis like movements are and difficulty raising just one limb at a time; no facial droop Skin: Warm and dry Psychiatric: flat affect    ED Course  Procedures (including critical care time)     MDM   Nursing notes and vitals signs, including pulse oximetry, reviewed.  Summary of this visit's results, reviewed by myself:  Labs:  Results for orders placed during the hospital encounter of  02/20/12  BASIC METABOLIC PANEL      Component Value Range   Sodium 141  135 - 145 mEq/L   Potassium 3.6  3.5 - 5.1 mEq/L   Chloride 108  96 - 112 mEq/L   CO2 21  19 - 32 mEq/L   Glucose, Bld 114 (*) 70 - 99 mg/dL   BUN 15  6 - 23 mg/dL   Creatinine, Ser 4.54  0.50 - 1.10 mg/dL   Calcium 9.6  8.4 - 09.8 mg/dL   GFR calc non Af Amer >90  >90 mL/min   GFR calc Af Amer >90  >90 mL/min  CBC      Component Value Range   WBC 10.5  4.0 - 10.5 K/uL   RBC 5.19 (*) 3.87 - 5.11 MIL/uL   Hemoglobin 16.4 (*) 12.0 - 15.0 g/dL   HCT 11.9 (*) 14.7 - 82.9 %   MCV 91.7  78.0 - 100.0 fL   MCH 31.6  26.0 - 34.0 pg   MCHC 34.5  30.0 - 36.0 g/dL   RDW 56.2  13.0 - 86.5 %   Platelets 243  150 - 400 K/uL  ETHANOL      Component Value Range   Alcohol, Ethyl (B) <11  0 - 11 mg/dL  URINE RAPID DRUG SCREEN (HOSP PERFORMED)      Component Value Range   Opiates NONE DETECTED  NONE DETECTED   Cocaine NONE DETECTED  NONE DETECTED   Benzodiazepines POSITIVE (*) NONE DETECTED   Amphetamines POSITIVE (*) NONE DETECTED   Tetrahydrocannabinol NONE DETECTED  NONE DETECTED   Barbiturates NONE DETECTED  NONE DETECTED  URINALYSIS, MICROSCOPIC ONLY      Component Value Range   Color, Urine YELLOW  YELLOW   APPearance CLEAR  CLEAR   Specific Gravity, Urine <1.005 (*) 1.005 - 1.030   pH 6.0  5.0 - 8.0   Glucose, UA NEGATIVE  NEGATIVE mg/dL   Hgb urine dipstick SMALL (*) NEGATIVE   Bilirubin Urine NEGATIVE  NEGATIVE   Ketones, ur NEGATIVE  NEGATIVE mg/dL   Protein, ur NEGATIVE  NEGATIVE mg/dL   Urobilinogen, UA 0.2  0.0 - 1.0 mg/dL   Nitrite NEGATIVE  NEGATIVE   Leukocytes, UA NEGATIVE  NEGATIVE   WBC, UA 0-2  <3 WBC/hpf   RBC / HPF 0-2  <3 RBC/hpf   Squamous Epithelial / LPF RARE  RARE  HEPATIC FUNCTION PANEL      Component Value Range   Total Protein 7.0  6.0 - 8.3 g/dL   Albumin 3.8  3.5 - 5.2 g/dL   AST 10  0 - 37 U/L   ALT 10  0 - 35 U/L   Alkaline Phosphatase 107  39 - 117 U/L   Total Bilirubin  0.4  0.3 - 1.2 mg/dL   Bilirubin, Direct <9.6  0.0 - 0.3 mg/dL   Indirect Bilirubin NOT CALCULATED  0.3 - 0.9 mg/dL  AMMONIA      Component Value Range   Ammonia 21  11 - 60 umol/L    Imaging Studies: Dg Chest 1 View  02/21/2012  *RADIOLOGY REPORT*  Clinical Data: Altered mental status.  Seizure.  CHEST - 1 VIEW  Comparison: 01/20/2012  Findings: The lungs are clear without focal infiltrate, edema, pneumothorax or pleural effusion. Interstitial markings are diffusely coarsened with chronic features. Cardiopericardial silhouette is at upper limits of normal for size. Imaged bony structures of the thorax are intact. Telemetry leads overlie the chest.  IMPRESSION: Underlying chronic interstitial coarsening.  No acute cardiopulmonary process.   Original Report Authenticated By: ERIC A. MANSELL, M.D.    Ct Head Wo Contrast  02/21/2012  *RADIOLOGY REPORT*  Clinical Data: Altered mental status.  Seizure activity.  CT HEAD WITHOUT CONTRAST  Technique:  Contiguous axial images were obtained from the base of the skull through the vertex without contrast.  Comparison: None.  Findings: There is no evidence for acute hemorrhage, hydrocephalus, mass lesion, or abnormal extra-axial fluid collection.  No definite CT evidence for acute infarction.  The visualized paranasal sinuses and mastoid air cells are clear.  IMPRESSION: Normal exam.   Original Report Authenticated By: ERIC A. MANSELL, M.D.      Date: 02/20/2012 11:41 PM  Rate: 83  Rhythm: normal sinus rhythm  QRS Axis: normal  Intervals: normal  ST/T Wave abnormalities: normal  Conduction Disutrbances: none  Narrative Interpretation: tremor artifact  Comparison with previous EKG: none available  4:22 AM Patient still awake and alert but gives inappropriate answers. She states she is in the hospital but when asked which hospital she responds "[facility]". When asked what year it is she responds "tobacco". Patient's presentation and sound suspicious for a  seizure however postictal state would be expected to have resolved by now. Also patient's inappropriate answers suggest an expressive aphasia. We will have her admitted for further evaluation.          Hanley Seamen, MD 02/21/12 0423  Hanley Seamen, MD 02/21/12 [phone]

## 2012-02-21 NOTE — ED Notes (Signed)
Patient placed on 2 liters of oxygen via nasal canula due to oxygen saturation dropping to 94%

## 2012-02-21 NOTE — Progress Notes (Signed)
Patient reported having a small amount of blood in her diarrhea this afternoon. Patient also reports having these episodes often, up to 10 times a day, for the past three months. Patient also reports that she has been told in the past she "has the worst case of IBS". Dr. Kerry Hough notified.

## 2012-02-21 NOTE — Evaluation (Signed)
Physical Therapy Evaluation Patient Details Name: Theresa Pace MRN: [id number] DOB: 1961/04/30 Today's Date: 02/21/2012 Time: 4098-1191 PT Time Calculation (min): 17 min  PT Assessment / Plan / Recommendation Clinical Impression  Pt is found to have no functional problems during eval.  She does say that she is under a lot of stress at home.  No PT is needed    PT Assessment  Patent does not need any further PT services    Follow Up Recommendations  No PT follow up    Barriers to Discharge        Equipment Recommendations  None recommended by PT    Recommendations for Other Services     Frequency      Precautions / Restrictions Precautions Precautions: None Restrictions Weight Bearing Restrictions: No   Pertinent Vitals/Pain       Mobility  Bed Mobility Bed Mobility: Supine to Sit;Sit to Supine Supine to Sit: 7: Independent Sit to Supine: 7: Independent Transfers Transfers: Sit to Stand;Stand to Sit Sit to Stand: 7: Independent Stand to Sit: 7: Independent Ambulation/Gait Ambulation/Gait Assistance: 7: Independent Ambulation Distance (Feet): 20 Feet Assistive device: None Gait Pattern: Within Functional Limits General Gait Details: no antalgia noted during gait Stairs: No Wheelchair Mobility Wheelchair Mobility: No    Shoulder Instructions     Exercises     PT Diagnosis:    PT Problem List:   PT Treatment Interventions:     PT Goals    Visit Information  Last PT Received On: 02/21/12 Assistance Needed: +1 PT/OT Co-Evaluation/Treatment: Yes    Subjective Data  Subjective: I'm under a lot of stress...that's why my IBS is acting up Patient Stated Goal: none stated   Prior Functioning  Home Living Lives With: Spouse Prior Function Level of Independence: Independent Driving: Yes Vocation: On disability Communication Communication: No difficulties Dominant Hand: Right    Cognition  Overall Cognitive Status: Appears within functional limits  for tasks assessed/performed Arousal/Alertness: Awake/alert Orientation Level: Appears intact for tasks assessed Behavior During Session: [facility] for tasks performed    Extremity/Trunk Assessment Right Lower Extremity Assessment RLE ROM/Strength/Tone: WFL for tasks assessed RLE Sensation: WFL - Light Touch RLE Coordination: WFL - gross motor Left Lower Extremity Assessment LLE ROM/Strength/Tone: WFL for tasks assessed LLE Sensation: WFL - Light Touch LLE Coordination: WFL - gross motor Trunk Assessment Trunk Assessment: Normal   Balance Balance Balance Assessed: No (WNL by observation)  End of Session PT - End of Session Activity Tolerance: Patient tolerated treatment well;Patient limited by pain (pt declines gait in hall due to back pain) Patient left: in bed;with call bell/phone within reach Nurse Communication: Mobility status  GP     Konrad Penta 02/21/2012, 2:21 PM

## 2012-02-21 NOTE — Progress Notes (Signed)
Patient admitted earlier today by Dr. Rito Ehrlich  Patient seen and examined, database reviewed  She was admitted to the hospital for confusion/altered mental status.  Work up thus far has been unremarkable.  MRI brain did not show acute findings, infectious work up has also been unremarkable.  She is currently undergoing EEG, although I am doubtful that this is seizure related.   Her mental status appears to have improved some, but she is still confused and cannot tell you the date. I wonder if this is related to her medications or if this is possibly related to an underlying psychiatric condition.  She is on multiple medications including, xanax, baclofen, robaxin, percocet, seroquel and adderall. She does not have any focal neurologic deficits.  We will ask for neurology input, to see if any further work up is required.

## 2012-02-21 NOTE — ED Notes (Signed)
Dr. Krishnan in to see patient at this time.  

## 2012-02-22 DIAGNOSIS — K640 First degree hemorrhoids: Secondary | ICD-10-CM | POA: Insufficient documentation

## 2012-02-22 DIAGNOSIS — K649 Unspecified hemorrhoids: Secondary | ICD-10-CM

## 2012-02-22 DIAGNOSIS — R569 Unspecified convulsions: Secondary | ICD-10-CM | POA: Diagnosis present

## 2012-02-22 LAB — COMPREHENSIVE METABOLIC PANEL
ALT: 7 U/L (ref 0–35)
Alkaline Phosphatase: 87 U/L (ref 39–117)
BUN: 21 mg/dL (ref 6–23)
CO2: 20 mEq/L (ref 19–32)
GFR calc Af Amer: >90 mL/min (ref >90)
GFR calc non Af Amer: >90 mL/min (ref >90)
Glucose, Bld: 94 mg/dL (ref 70–99)
Potassium: 3.2 mEq/L — ABNORMAL LOW (ref 3.5–5.1)
Sodium: 139 mEq/L (ref 135–145)
Total Bilirubin: 0.4 mg/dL (ref 0.3–1.2)
Total Protein: 5.7 g/dL — ABNORMAL LOW (ref 6.0–8.3)

## 2012-02-22 LAB — CBC
HCT: 40.2 % (ref 36.0–46.0)
MCHC: 33.6 g/dL (ref 30.0–36.0)
Platelets: 241 10*3/uL (ref 150–400)
RDW: 13.5 % (ref 11.5–15.5)
WBC: 7.5 10*3/uL (ref 4.0–10.5)

## 2012-02-22 MED ORDER — ACETAMINOPHEN 325 MG PO TABS
650.0000 mg | ORAL_TABLET | ORAL | Status: DC | PRN
  Administered 2012-02-22 (×2): 650 mg via ORAL
  Filled 2012-02-22 (×2): qty 2

## 2012-02-22 MED ORDER — OXYCODONE-ACETAMINOPHEN 5-325 MG PO TABS
2.0000 | ORAL_TABLET | Freq: Four times a day (QID) | ORAL | Status: DC | PRN
  Administered 2012-02-22: 2 via ORAL
  Filled 2012-02-22: qty 2

## 2012-02-22 MED ORDER — HYDROCORTISONE ACETATE 25 MG RE SUPP: 25.0000 mg | Freq: Two times a day (BID) | RECTAL | Status: DC

## 2012-02-22 MED ORDER — ACETAMINOPHEN 650 MG RE SUPP: 650.0000 mg | RECTAL | Status: DC | PRN

## 2012-02-22 NOTE — Procedures (Signed)
[facility] Theresa Pace A. Gerilyn Pilgrim, MD     [email or web address]        FACILITY: [facility]  PHYSICIAN: Kyla Duffy A. Gerilyn Pilgrim, M.D.  DATE OF PROCEDURE:  EEG INTERPRETATION  INDICATION: The patient is a 51 year old who had a seizure associated with convulsions and prolonged postictal state.  MEDICATIONS:  Prior to Admission medications   Medication Sig Start Date End Date Taking? Authorizing Provider  albuterol (PROVENTIL HFA;VENTOLIN HFA) 108 (90 BASE) MCG/ACT inhaler Inhale 2 puffs into the lungs every 6 (six) hours as needed.   Yes Historical Provider, MD  ALPRAZolam Prudy Feeler) 1 MG tablet Take 1 mg by mouth 3 (three) times daily.   Yes Historical Provider, MD  amphetamine-dextroamphetamine (ADDERALL XR) 30 MG 24 hr capsule Take 90 mg by mouth every morning.   Yes Historical Provider, MD  baclofen (LIORESAL) 10 MG tablet Take 10 mg by mouth 3 (three) times daily.   Yes Historical Provider, MD  DULoxetine (CYMBALTA) 30 MG capsule Take 30 mg by mouth daily.   Yes Historical Provider, MD  Fluticasone-Salmeterol (ADVAIR) 250-50 MCG/DOSE AEPB Inhale 1 puff into the lungs every 12 (twelve) hours.   Yes Historical Provider, MD  loratadine (CLARITIN) 10 MG tablet Take 10 mg by mouth daily.   Yes Historical Provider, MD  methocarbamol (ROBAXIN) 750 MG tablet Take 750 mg by mouth 4 (four) times daily. For back spasms, patient has not filled since 04/2011, but she states she takes if needed   Yes Historical Provider, MD  montelukast (SINGULAIR) 10 MG tablet Take 10 mg by mouth at bedtime.   Yes Historical Provider, MD  omeprazole (PRILOSEC) 20 MG capsule Take 20 mg by mouth daily.   Yes Historical Provider, MD  ondansetron (ZOFRAN) 4 MG tablet Take 4 mg by mouth every 8 (eight) hours as needed.   Yes Historical Provider, MD  oxyCODONE-acetaminophen (PERCOCET) 10-325 MG per tablet Take 1 tablet by mouth every 6 (six) hours as needed. Back pain   Yes Historical Provider, MD  QUEtiapine Fumarate (SEROQUEL XR) 150  MG 24 hr tablet Take 150 mg by mouth at bedtime.   Yes Historical Provider, MD  rosuvastatin (CRESTOR) 20 MG tablet Take 20 mg by mouth daily.   Yes Historical Provider, MD  traZODone (DESYREL) 50 MG tablet Take 100 mg by mouth at bedtime.   Yes Historical Provider, MD     ANALYSIS: A 16-channel recording using standard 10/20 measurements is conducted for 22 minutes. There is a posterior dominant rhythm of 8 Hz to 8-1/2 Hz which attenuates with eye opening. There is beta activity observed in the frontal areas. Awake and drowsy activities are recorded. The patient is noted to have intermittent and occasional frontal intermittent rhythmic delta activity. There is no focal or lateralized slowing there is no epileptiform activity observed.  IMPRESSION:   1. Mild generalized slowing indicating a mild generalized encephalopathy. 2. Occasional frontal intermittent rhythmic delta activity. This is typically seen in toxic metabolic encephalopathies. The patient does not have any epileptiform discharges however. Theresa Pace A. Gerilyn Pilgrim, M.D. Diplomate, Biomedical engineer of Psychiatry and Neurology ( Neurology).

## 2012-02-22 NOTE — Consult Note (Signed)
Reason for Consult: Referring Physician:   [name] is an 51 y.o. female.  HPI:   Past Medical History  Diagnosis Date  . Asthma   . Depression   . Frequent urination at night   . IBS (irritable bowel syndrome)   . GERD (gastroesophageal reflux disease)   . Hypercholesteremia     Past Surgical History  Procedure Date  . Abdominal hysterectomy     History reviewed. No pertinent family history.  Social History:  reports that she has been smoking.  She does not have any smokeless tobacco history on file. She reports that she drinks alcohol. She reports that she does not use illicit drugs.  Allergies:  Allergies  Allergen Reactions  . Aspirin Shortness Of Breath and Rash    Medications:  Prior to Admission medications   Medication Sig Start Date End Date Taking? Authorizing Provider  albuterol (PROVENTIL HFA;VENTOLIN HFA) 108 (90 BASE) MCG/ACT inhaler Inhale 2 puffs into the lungs every 6 (six) hours as needed.   Yes Historical Provider, MD  ALPRAZolam Prudy Feeler) 1 MG tablet Take 1 mg by mouth 3 (three) times daily.   Yes Historical Provider, MD  amphetamine-dextroamphetamine (ADDERALL XR) 30 MG 24 hr capsule Take 90 mg by mouth every morning.   Yes Historical Provider, MD  baclofen (LIORESAL) 10 MG tablet Take 10 mg by mouth 3 (three) times daily.   Yes Historical Provider, MD  DULoxetine (CYMBALTA) 30 MG capsule Take 30 mg by mouth daily.   Yes Historical Provider, MD  Fluticasone-Salmeterol (ADVAIR) 250-50 MCG/DOSE AEPB Inhale 1 puff into the lungs every 12 (twelve) hours.   Yes Historical Provider, MD  loratadine (CLARITIN) 10 MG tablet Take 10 mg by mouth daily.   Yes Historical Provider, MD  methocarbamol (ROBAXIN) 750 MG tablet Take 750 mg by mouth 4 (four) times daily. For back spasms, patient has not filled since 04/2011, but she states she takes if needed   Yes Historical Provider, MD  montelukast (SINGULAIR) 10 MG tablet Take 10 mg by mouth at bedtime.   Yes  Historical Provider, MD  omeprazole (PRILOSEC) 20 MG capsule Take 20 mg by mouth daily.   Yes Historical Provider, MD  ondansetron (ZOFRAN) 4 MG tablet Take 4 mg by mouth every 8 (eight) hours as needed.   Yes Historical Provider, MD  oxyCODONE-acetaminophen (PERCOCET) 10-325 MG per tablet Take 1 tablet by mouth every 6 (six) hours as needed. Back pain   Yes Historical Provider, MD  QUEtiapine Fumarate (SEROQUEL XR) 150 MG 24 hr tablet Take 150 mg by mouth at bedtime.   Yes Historical Provider, MD  rosuvastatin (CRESTOR) 20 MG tablet Take 20 mg by mouth daily.   Yes Historical Provider, MD  traZODone (DESYREL) 50 MG tablet Take 100 mg by mouth at bedtime.   Yes Historical Provider, MD   Scheduled Meds:   . atorvastatin  40 mg Oral q1800  . DULoxetine  30 mg Oral Daily  . enoxaparin (LOVENOX) injection  40 mg Subcutaneous Q24H  . Fluticasone-Salmeterol  1 puff Inhalation Q12H  . ibuprofen  600 mg Oral Once  . influenza  inactive virus vaccine  0.5 mL Intramuscular Tomorrow-1000  . methocarbamol  750 mg Oral QID  . pantoprazole  40 mg Oral Q1200  . pneumococcal 23 valent vaccine  0.5 mL Intramuscular Tomorrow-1000  . sodium chloride  500 mL Intravenous Once  . sodium chloride  3 mL Intravenous Q12H  . thiamine  100 mg Intravenous Daily  Continuous Infusions:   . sodium chloride 75 mL/hr at 02/21/12 1657   PRN Meds:.acetaminophen, acetaminophen, albuterol, albuterol, ALPRAZolam, ondansetron (ZOFRAN) IV, ondansetron, DISCONTD: acetaminophen, DISCONTD: acetaminophen   Results for orders placed during the hospital encounter of 02/20/12 (from the past 48 hour(s))  BASIC METABOLIC PANEL     Status: Abnormal   Collection Time   02/20/12 11:54 PM      Component Value Range Comment   Sodium 141  135 - 145 mEq/L    Potassium 3.6  3.5 - 5.1 mEq/L    Chloride 108  96 - 112 mEq/L    CO2 21  19 - 32 mEq/L    Glucose, Bld 114 (*) 70 - 99 mg/dL    BUN 15  6 - 23 mg/dL    Creatinine, Ser 1.61   0.50 - 1.10 mg/dL    Calcium 9.6  8.4 - 09.6 mg/dL    GFR calc non Af Amer >90  >90 mL/min    GFR calc Af Amer >90  >90 mL/min   CBC     Status: Abnormal   Collection Time   02/20/12 11:54 PM      Component Value Range Comment   WBC 10.5  4.0 - 10.5 K/uL    RBC 5.19 (*) 3.87 - 5.11 MIL/uL    Hemoglobin 16.4 (*) 12.0 - 15.0 g/dL    HCT 04.5 (*) 40.9 - 46.0 %    MCV 91.7  78.0 - 100.0 fL    MCH 31.6  26.0 - 34.0 pg    MCHC 34.5  30.0 - 36.0 g/dL    RDW 81.1  91.4 - 78.2 %    Platelets 243  150 - 400 K/uL   ETHANOL     Status: Normal   Collection Time   02/20/12 11:54 PM      Component Value Range Comment   Alcohol, Ethyl (B) <11  0 - 11 mg/dL   URINE RAPID DRUG SCREEN (HOSP PERFORMED)     Status: Abnormal   Collection Time   02/21/12 12:55 AM      Component Value Range Comment   Opiates NONE DETECTED  NONE DETECTED    Cocaine NONE DETECTED  NONE DETECTED    Benzodiazepines POSITIVE (*) NONE DETECTED    Amphetamines POSITIVE (*) NONE DETECTED    Tetrahydrocannabinol NONE DETECTED  NONE DETECTED    Barbiturates NONE DETECTED  NONE DETECTED   URINALYSIS, MICROSCOPIC ONLY     Status: Abnormal   Collection Time   02/21/12 12:55 AM      Component Value Range Comment   Color, Urine YELLOW  YELLOW    APPearance CLEAR  CLEAR    Specific Gravity, Urine <1.005 (*) 1.005 - 1.030    pH 6.0  5.0 - 8.0    Glucose, UA NEGATIVE  NEGATIVE mg/dL    Hgb urine dipstick SMALL (*) NEGATIVE    Bilirubin Urine NEGATIVE  NEGATIVE    Ketones, ur NEGATIVE  NEGATIVE mg/dL    Protein, ur NEGATIVE  NEGATIVE mg/dL    Urobilinogen, UA 0.2  0.0 - 1.0 mg/dL    Nitrite NEGATIVE  NEGATIVE    Leukocytes, UA NEGATIVE  NEGATIVE    WBC, UA 0-2  <3 WBC/hpf    RBC / HPF 0-2  <3 RBC/hpf    Squamous Epithelial / LPF RARE  RARE   HEPATIC FUNCTION PANEL     Status: Normal   Collection Time   02/21/12  1:33 AM  Component Value Range Comment   Total Protein 7.0  6.0 - 8.3 g/dL    Albumin 3.8  3.5 - 5.2 g/dL     AST 10  0 - 37 U/L    ALT 10  0 - 35 U/L    Alkaline Phosphatase 107  39 - 117 U/L    Total Bilirubin 0.4  0.3 - 1.2 mg/dL    Bilirubin, Direct <9.6  0.0 - 0.3 mg/dL    Indirect Bilirubin NOT CALCULATED  0.3 - 0.9 mg/dL   PROLACTIN     Status: Normal   Collection Time   02/21/12  1:33 AM      Component Value Range Comment   Prolactin 4.5     AMMONIA     Status: Normal   Collection Time   02/21/12  1:34 AM      Component Value Range Comment   Ammonia 21  11 - 60 umol/L   TSH     Status: Normal   Collection Time   02/21/12  5:00 AM      Component Value Range Comment   TSH 0.743  0.350 - 4.500 uIU/mL   COMPREHENSIVE METABOLIC PANEL     Status: Abnormal   Collection Time   02/22/12  5:33 AM      Component Value Range Comment   Sodium 139  135 - 145 mEq/L    Potassium 3.2 (*) 3.5 - 5.1 mEq/L    Chloride 108  96 - 112 mEq/L    CO2 20  19 - 32 mEq/L    Glucose, Bld 94  70 - 99 mg/dL    BUN 21  6 - 23 mg/dL    Creatinine, Ser 2.95  0.50 - 1.10 mg/dL    Calcium 8.6  8.4 - 28.4 mg/dL    Total Protein 5.7 (*) 6.0 - 8.3 g/dL    Albumin 3.0 (*) 3.5 - 5.2 g/dL    AST 8  0 - 37 U/L    ALT 7  0 - 35 U/L    Alkaline Phosphatase 87  39 - 117 U/L    Total Bilirubin 0.4  0.3 - 1.2 mg/dL    GFR calc non Af Amer >90  >90 mL/min    GFR calc Af Amer >90  >90 mL/min   CBC     Status: Normal   Collection Time   02/22/12  5:33 AM      Component Value Range Comment   WBC 7.5  4.0 - 10.5 K/uL    RBC 4.34  3.87 - 5.11 MIL/uL    Hemoglobin 13.5  12.0 - 15.0 g/dL    HCT 13.2  44.0 - 10.2 %    MCV 92.6  78.0 - 100.0 fL    MCH 31.1  26.0 - 34.0 pg    MCHC 33.6  30.0 - 36.0 g/dL    RDW 72.5  36.6 - 44.0 %    Platelets 241  150 - 400 K/uL     Dg Chest 1 View  02/21/2012  *RADIOLOGY REPORT*  Clinical Data: Altered mental status.  Seizure.  CHEST - 1 VIEW  Comparison: 01/20/2012  Findings: The lungs are clear without focal infiltrate, edema, pneumothorax or pleural effusion. Interstitial markings are  diffusely coarsened with chronic features. Cardiopericardial silhouette is at upper limits of normal for size. Imaged bony structures of the thorax are intact. Telemetry leads overlie the chest.  IMPRESSION: Underlying chronic interstitial coarsening.  No acute cardiopulmonary process.  Original Report Authenticated By: ERIC A. MANSELL, M.D.    Ct Head Wo Contrast  02/21/2012  *RADIOLOGY REPORT*  Clinical Data: Altered mental status.  Seizure activity.  CT HEAD WITHOUT CONTRAST  Technique:  Contiguous axial images were obtained from the base of the skull through the vertex without contrast.  Comparison: None.  Findings: There is no evidence for acute hemorrhage, hydrocephalus, mass lesion, or abnormal extra-axial fluid collection.  No definite CT evidence for acute infarction.  The visualized paranasal sinuses and mastoid air cells are clear.  IMPRESSION: Normal exam.   Original Report Authenticated By: ERIC A. MANSELL, M.D.    Mr Brain Wo Contrast  02/21/2012  *RADIOLOGY REPORT*  Clinical Data: Possible seizure yesterday.  MRI HEAD WITHOUT CONTRAST  Technique:  Multiplanar, multiecho pulse sequences of the brain and surrounding structures were obtained according to standard protocol without intravenous contrast.  Comparison: 02/21/2012 CT head  Findings: The patient had difficulty remaining motionless for the study.  Images are suboptimal.  Small or subtle lesions could be overlooked.  There is no evidence for acute infarction, intracranial hemorrhage, mass lesion, hydrocephalus, or extra-axial fluid.  There is no significant atrophy or white matter disease.  Symmetric temporal lobes within limits of evaluation due to degraded images.  Major intracranial vascular structures widely patent.  Clear sinuses and mastoids.  IMPRESSION: Negative exam within limits of evaluation due to patient motion. No visible temporal lobe abnormality or acute intracranial findings.   Original Report Authenticated By: Elsie Stain, M.D.     ROS Blood pressure 120/71, pulse 72, temperature 99.6 F (37.6 C), temperature source Oral, resp. rate 18, height 5\' 2"  (1.575 m), weight 70.3 kg (154 lb 15.7 oz), SpO2 94.00%. Physical Exam  Assessment/Plan: ASSESSMENT/PLAN:  1. Likely seizure due to medication effect. She believes that is coming from the Robaxin although this is not unusual medication to cause seizure. It is possible that she may have fell hit her head and developed a seizure. Additionally, she is on multiple psychotropic medications which can cause serotonin syndrome and seizure. These medication includes the following: Seroquel, Zofran, Cymbalta,, adderall and trazodone. I would suggest that these medication be used with caution. I think we probably can get rid of his Zofran to use other medications for nausea and vomiting. We may also consider reducing dose of the Seroquel. I would suggest that the patient be placed on seizure precaution for 3 months and not be allowed to drive until we see if things work out. Given her normal EEG, suspect that the risk of recurrence should be low.  SUBJECTIVE: The patient is a 51 year old white female who is on multiple psychotropic medications for chronic pain and psychiatric problems. The patient has been on Robaxin for many years apparently. However, the dose was increased by her pain doctor Dr. Laurian Brim in [place]. She reports after taking the medication she became ataxic and started falling. She reports that she has had similar reaction to the Robaxin in the past. This time around however it appears that she fell to the ground and started having convulsions as witnessed by her husband. The patient has been amnestic to the event and has had a prolonged post ictal state. She has had an extensive workup in the hospital here included labs which does not show typical metabolic derangements that cause seizure. Imaging with head CT scan and MRI have also been unremarkable. She was at an  EEG. There is no previous history of seizures. She is back to  baseline cognitively although she has no skin amnesia about the event. She reports having urinary incontinence however during the event. She denies any oral trauma such as tongue biting. No focal neurological deficits are reported and there is no significant headaches at this point in time although she did have some previously. She has chronic neck and back pain issues.   OBJECTIVE: GENERAL: This is a pleasant female who is in no acute distress.  HEENT: Retro-palatal space is fine. Head is normocephalic and atraumatic. Neck is supple.  ABDOMEN: soft  EXTREMITIES: No edema   BACK: Unremarkable.  SKIN: Normal by inspection.    MENTAL STATUS: Alert and oriented. Speech, language and cognition are generally intact. Judgment and insight normal.   CRANIAL NERVES: Pupils are equal, round and reactive to light and accomodation; extra ocular movements are full, there is no significant nystagmus; visual fields are full; upper and lower facial muscles are normal in strength and symmetric, there is no flattening of the nasolabial folds; tongue is midline; uvula is midline; shoulder elevation is normal.  MOTOR: Normal tone, bulk and strength; no pronator drift.  COORDINATION: Left finger to nose is normal, right finger to nose is normal, No rest tremor; no intention tremor; no postural tremor; no bradykinesia.  REFLEXES: Deep tendon reflexes are symmetrical and normal. Babinski reflexes are flexor bilaterally.   SENSATION: Normal to light touch, temperature, and pinprick.  EEG is unremarkable. There is no epileptiform discharge although she does have frontal intermittent rhythmic delta activity and mild slowing both associated with mild metabolic encephalopathies.    Beryle Beams, MD Diplomate, [employer] Board of Psychiatry and Neurology (Neurology).  Theresa Pace 02/22/2012, 9:18 AM

## 2012-02-22 NOTE — Discharge Summary (Signed)
Physician Discharge Summary  Theresa Pace MRN:[id number] DOB: 05/30/1960 DOA: 02/20/2012  PCP: No primary provider on file.  Admit date: 02/20/2012 Discharge date: 02/22/2012  Recommendations for Outpatient Follow-up:  1. Follow up with GI Dr. Benson on 10/4 at 4pm 2. Follow up with primary care doctor in 2 weeks 3. No driving for 3 months  Discharge Diagnoses:  Principal Problem:  *Seizure Active Problems:  Acute encephalopathy  Asthma  Depression  Bleeding hemorrhoid   Discharge Condition: improved  Diet recommendation: regular  Filed Weights   02/20/12 2346 02/21/12 0650  Weight: 70.308 kg (155 lb) 70.3 kg (154 lb 15.7 oz)    History of present illness:  This is a 51-year-old, Caucasian female, with a past medical history of depression and possibly bipolar disorder, as well as asthma, who was in her usual state of health till sometime last night when she was found by her husband to be shaking. He felt that the patient had a seizure. He, subsequently called EMS and the patient was brought into the hospital. Patient was monitored in the ED and initially, it was felt that she had a post ictal state. However, her confusion persisted for many hours and, so, we were called for further evaluation. Unfortunately, patient's husband cannot be traced at this time. Patient is unable to answer to many questions. She complains of low back pain, which she tells me is chronic for her. She denies any chest pain. Denies headache. No recent history of fever or chills. No history of falls. She denies taking none more than usual quantities of her home medications. History is very limited at this time   Hospital Course:  This lady was admitted for an acute change in mental status. Workup including CT of the brain and MRI of the brain are unremarkable. EEG was also done which have not shown epileptiform discharges. She was seen by neurology. Her hospital course her mental status has improved and is now  returned to baseline. It is felt that she may have been postictal after having a seizure. Antiseizure medications are not recommended at this point. Patient has been advised not to drive for the next 3 months. She feels that her Robaxin may have been the precipitating agent. She's had a poor reaction to this in the past as well. It was recommended that patient stay off her Robaxin.  Patient did describe some blood in her stools. She reports having this intermittently for the past year. She is followed by a gastroenterologist and has irritable bowel syndrome. On examination she does have some small hemorrhoids. Her hemoglobin is stable at 13.5. She's been scheduled a followup appointment with her gastroenterologist in the next 2 days. We will also give her prescription for Anusol suppositories.  Procedures:  EEG shows mild generalized slowing and occasional mild generalized encephalopathy, no epileptiform discharges  Consultations:  Neurology, Dr. Doonquah  Discharge Exam: Filed Vitals:   02/21/12 2215 02/22/12 0144 02/22/12 0615 02/22/12 0738  BP: 92/56 111/67 120/71   Pulse: 66  72   Temp: 98.3 F (36.8 C)  99.6 F (37.6 C)   TempSrc: Oral  Oral   Resp: 18     Height:      Weight:      SpO2: 97%   94%    General: No acute distress Cardiovascular: S1, S2, regular in rhythm Respiratory: Clear to auscultation bilaterally Neurologic: Grossly intact, nonfocal  Discharge Instructions  Discharge Orders    Future Orders Please Complete By Expires     Diet - low sodium heart healthy      Increase activity slowly      Call MD for:  extreme fatigue      Call MD for:  persistant dizziness or light-headedness      Call MD for:      Comments:   Worsening rectal bleeding   Driving Restrictions      Comments:   No driving for 3 months   Other Restrictions      Comments:   Do not bathe in standing water, no operating heavy machinery       Medication List     As of 02/22/2012 11:32  AM    STOP taking these medications         methocarbamol 750 MG tablet   Commonly known as: ROBAXIN      ondansetron 4 MG tablet   Commonly known as: ZOFRAN      TAKE these medications         albuterol 108 (90 BASE) MCG/ACT inhaler   Commonly known as: PROVENTIL HFA;VENTOLIN HFA   Inhale 2 puffs into the lungs every 6 (six) hours as needed.      ALPRAZolam 1 MG tablet   Commonly known as: XANAX   Take 1 mg by mouth 3 (three) times daily.      amphetamine-dextroamphetamine 30 MG 24 hr capsule   Commonly known as: ADDERALL XR   Take 90 mg by mouth every morning.      baclofen 10 MG tablet   Commonly known as: LIORESAL   Take 10 mg by mouth 3 (three) times daily.      DULoxetine 30 MG capsule   Commonly known as: CYMBALTA   Take 30 mg by mouth daily.      Fluticasone-Salmeterol 250-50 MCG/DOSE Aepb   Commonly known as: ADVAIR   Inhale 1 puff into the lungs every 12 (twelve) hours.      hydrocortisone 25 MG suppository   Commonly known as: ANUSOL-HC   Place 1 suppository (25 mg total) rectally 2 (two) times daily.      loratadine 10 MG tablet   Commonly known as: CLARITIN   Take 10 mg by mouth daily.      montelukast 10 MG tablet   Commonly known as: SINGULAIR   Take 10 mg by mouth at bedtime.      omeprazole 20 MG capsule   Commonly known as: PRILOSEC   Take 20 mg by mouth daily.      oxyCODONE-acetaminophen 10-325 MG per tablet   Commonly known as: PERCOCET   Take 1 tablet by mouth every 6 (six) hours as needed. Back pain      QUEtiapine Fumarate 150 MG 24 hr tablet   Commonly known as: SEROQUEL XR   Take 150 mg by mouth at bedtime.      rosuvastatin 20 MG tablet   Commonly known as: CRESTOR   Take 20 mg by mouth daily.      traZODone 50 MG tablet   Commonly known as: DESYREL   Take 100 mg by mouth at bedtime.           Follow-up Information    Follow up with Benson, Christopher C, MD. (02/24/12 at 4pm)    Contact information:   [address] [place] [address] [phone]       Follow up with primary care doctor in 2 weeks.          The results of   significant diagnostics from this hospitalization (including imaging, microbiology, ancillary and laboratory) are listed below for reference.    Significant Diagnostic Studies: Dg Chest 1 View  02/21/2012  *RADIOLOGY REPORT*  Clinical Data: Altered mental status.  Seizure.  CHEST - 1 VIEW  Comparison: 01/20/2012  Findings: The lungs are clear without focal infiltrate, edema, pneumothorax or pleural effusion. Interstitial markings are diffusely coarsened with chronic features. Cardiopericardial silhouette is at upper limits of normal for size. Imaged bony structures of the thorax are intact. Telemetry leads overlie the chest.  IMPRESSION: Underlying chronic interstitial coarsening.  No acute cardiopulmonary process.   Original Report Authenticated By: ERIC A. MANSELL, M.D.    Ct Head Wo Contrast  02/21/2012  *RADIOLOGY REPORT*  Clinical Data: Altered mental status.  Seizure activity.  CT HEAD WITHOUT CONTRAST  Technique:  Contiguous axial images were obtained from the base of the skull through the vertex without contrast.  Comparison: None.  Findings: There is no evidence for acute hemorrhage, hydrocephalus, mass lesion, or abnormal extra-axial fluid collection.  No definite CT evidence for acute infarction.  The visualized paranasal sinuses and mastoid air cells are clear.  IMPRESSION: Normal exam.   Original Report Authenticated By: ERIC A. MANSELL, M.D.    Mr Brain Wo Contrast  02/21/2012  *RADIOLOGY REPORT*  Clinical Data: Possible seizure yesterday.  MRI HEAD WITHOUT CONTRAST  Technique:  Multiplanar, multiecho pulse sequences of the brain and surrounding structures were obtained according to standard protocol without intravenous contrast.  Comparison: 02/21/2012 CT head  Findings: The patient had difficulty remaining motionless for the study.  Images are suboptimal.  Small  or subtle lesions could be overlooked.  There is no evidence for acute infarction, intracranial hemorrhage, mass lesion, hydrocephalus, or extra-axial fluid.  There is no significant atrophy or white matter disease.  Symmetric temporal lobes within limits of evaluation due to degraded images.  Major intracranial vascular structures widely patent.  Clear sinuses and mastoids.  IMPRESSION: Negative exam within limits of evaluation due to patient motion. No visible temporal lobe abnormality or acute intracranial findings.   Original Report Authenticated By: JOHN T. CURNES, M.D.     Microbiology: No results found for this or any previous visit (from the past 240 hour(s)).   Labs: Basic Metabolic Panel:  Lab 02/22/12 0533 02/20/12 2354  NA 139 141  K 3.2* 3.6  CL 108 108  CO2 20 21  GLUCOSE 94 114*  BUN 21 15  CREATININE 0.69 0.65  CALCIUM 8.6 9.6  MG -- --  PHOS -- --   Liver Function Tests:  Lab 02/22/12 0533 02/21/12 0133  AST 8 10  ALT 7 10  ALKPHOS 87 107  BILITOT 0.4 0.4  PROT 5.7* 7.0  ALBUMIN 3.0* 3.8   No results found for this basename: LIPASE:5,AMYLASE:5 in the last 168 hours  Lab 02/21/12 0134  AMMONIA 21   CBC:  Lab 02/22/12 0533 02/20/12 2354  WBC 7.5 10.5  NEUTROABS -- --  HGB 13.5 16.4*  HCT 40.2 47.6*  MCV 92.6 91.7  PLT 241 243   Cardiac Enzymes: No results found for this basename: CKTOTAL:5,CKMB:5,CKMBINDEX:5,TROPONINI:5 in the last 168 hours BNP: BNP (last 3 results) No results found for this basename: PROBNP:3 in the last 8760 hours CBG: No results found for this basename: GLUCAP:5 in the last 168 hours  Time coordinating discharge: Greater than 30 minutes  Signed:  Nayanna Seaborn  [facility] 02/22/2012, 11:32 AM       Lab  02/21/12 0134   AMMONIA  21    CBC:   Lab  02/22/12 0533  02/20/12 2354   WBC  7.5  10.5   NEUTROABS  --  --   HGB  13.5  16.4*   HCT  40.2  47.6*   MCV  92.6  91.7   PLT  241  243    Cardiac Enzymes:  No results found for this basename: CKTOTAL:5,CKMB:5,CKMBINDEX:5,TROPONINI:5 in the last 168 hours  BNP:  BNP (last 3 results)  No results found for this basename: PROBNP:3 in the last 8760 hours  CBG:  No results found for this basename: GLUCAP:5 in the last 168 hours   Time coordinating discharge: Greater than 30 minutes   Signed:  Rafael Salway  [facility]  02/22/2012, 11:32 AM

## 2012-02-22 NOTE — Progress Notes (Deleted)
Physician Discharge Summary  Theresa Pace:[id number] DOB: 1960-06-29 DOA: 02/20/2012  PCP: No primary provider on file.  Admit date: 02/20/2012 Discharge date: 02/22/2012  Recommendations for Outpatient Follow-up:  1. Follow up with GI Dr. Teena Dunk on 10/4 at 4pm 2. Follow up with primary care doctor in 2 weeks 3. No driving for 3 months  Discharge Diagnoses:  Principal Problem:  *Seizure Active Problems:  Acute encephalopathy  Asthma  Depression  Bleeding hemorrhoid   Discharge Condition: improved  Diet recommendation: regular  Filed Weights   02/20/12 2346 02/21/12 0650  Weight: 70.308 kg (155 lb) 70.3 kg (154 lb 15.7 oz)    History of present illness:  This is a 51 year old, Caucasian female, with a past medical history of depression and possibly bipolar disorder, as well as asthma, who was in her usual state of health till sometime last night when she was found by her husband to be shaking. He felt that the patient had a seizure. He, subsequently called EMS and the patient was brought into the hospital. Patient was monitored in the ED and initially, it was felt that she had a post ictal state. However, her confusion persisted for many hours and, so, we were called for further evaluation. Unfortunately, patient's husband cannot be traced at this time. Patient is unable to answer to many questions. She complains of low back pain, which she tells me is chronic for her. She denies any chest pain. Denies headache. No recent history of fever or chills. No history of falls. She denies taking none more than usual quantities of her home medications. History is very limited at this time   Hospital Course:  This lady was admitted for an acute change in mental status. Workup including CT of the brain and MRI of the brain are unremarkable. EEG was also done which have not shown epileptiform discharges. She was seen by neurology. Her hospital course her mental status has improved and is now  returned to baseline. It is felt that she may have been postictal after having a seizure. Antiseizure medications are not recommended at this point. Patient has been advised not to drive for the next 3 months. She feels that her Robaxin may have been the precipitating agent. She's had a poor reaction to this in the past as well. It was recommended that patient stay off her Robaxin.  Patient did describe some blood in her stools. She reports having this intermittently for the past year. She is followed by a gastroenterologist and has irritable bowel syndrome. On examination she does have some small hemorrhoids. Her hemoglobin is stable at 13.5. She's been scheduled a followup appointment with her gastroenterologist in the next 2 days. We will also give her prescription for Anusol suppositories.  Procedures:  EEG shows mild generalized slowing and occasional mild generalized encephalopathy, no epileptiform discharges  Consultations:  Neurology, Dr. Gerilyn Pilgrim  Discharge Exam: Filed Vitals:   02/21/12 2215 02/22/12 0144 02/22/12 0615 02/22/12 0738  BP: 92/56 111/67 120/71   Pulse: 66  72   Temp: 98.3 F (36.8 C)  99.6 F (37.6 C)   TempSrc: Oral  Oral   Resp: 18     Height:      Weight:      SpO2: 97%   94%    General: No acute distress Cardiovascular: S1, S2, regular in rhythm Respiratory: Clear to auscultation bilaterally Neurologic: Grossly intact, nonfocal  Discharge Instructions  Discharge Orders    Future Orders Please Complete By Expires  Diet - low sodium heart healthy      Increase activity slowly      Call MD for:  extreme fatigue      Call MD for:  persistant dizziness or light-headedness      Call MD for:      Comments:   Worsening rectal bleeding   Driving Restrictions      Comments:   No driving for 3 months   Other Restrictions      Comments:   Do not bathe in standing water, no operating heavy machinery       Medication List     As of 02/22/2012 11:32  AM    STOP taking these medications         methocarbamol 750 MG tablet   Commonly known as: ROBAXIN      ondansetron 4 MG tablet   Commonly known as: ZOFRAN      TAKE these medications         albuterol 108 (90 BASE) MCG/ACT inhaler   Commonly known as: PROVENTIL HFA;VENTOLIN HFA   Inhale 2 puffs into the lungs every 6 (six) hours as needed.      ALPRAZolam 1 MG tablet   Commonly known as: XANAX   Take 1 mg by mouth 3 (three) times daily.      amphetamine-dextroamphetamine 30 MG 24 hr capsule   Commonly known as: ADDERALL XR   Take 90 mg by mouth every morning.      baclofen 10 MG tablet   Commonly known as: LIORESAL   Take 10 mg by mouth 3 (three) times daily.      DULoxetine 30 MG capsule   Commonly known as: CYMBALTA   Take 30 mg by mouth daily.      Fluticasone-Salmeterol 250-50 MCG/DOSE Aepb   Commonly known as: ADVAIR   Inhale 1 puff into the lungs every 12 (twelve) hours.      hydrocortisone 25 MG suppository   Commonly known as: ANUSOL-HC   Place 1 suppository (25 mg total) rectally 2 (two) times daily.      loratadine 10 MG tablet   Commonly known as: CLARITIN   Take 10 mg by mouth daily.      montelukast 10 MG tablet   Commonly known as: SINGULAIR   Take 10 mg by mouth at bedtime.      omeprazole 20 MG capsule   Commonly known as: PRILOSEC   Take 20 mg by mouth daily.      oxyCODONE-acetaminophen 10-325 MG per tablet   Commonly known as: PERCOCET   Take 1 tablet by mouth every 6 (six) hours as needed. Back pain      QUEtiapine Fumarate 150 MG 24 hr tablet   Commonly known as: SEROQUEL XR   Take 150 mg by mouth at bedtime.      rosuvastatin 20 MG tablet   Commonly known as: CRESTOR   Take 20 mg by mouth daily.      traZODone 50 MG tablet   Commonly known as: DESYREL   Take 100 mg by mouth at bedtime.           Follow-up Information    Follow up with Lyda Kalata, MD. (02/24/12 at 4pm)    Contact information:   [address] [place] [address] [phone]       Follow up with primary care doctor in 2 weeks.          The results of  significant diagnostics from this hospitalization (including imaging, microbiology, ancillary and laboratory) are listed below for reference.    Significant Diagnostic Studies: Dg Chest 1 View  02/21/2012  *RADIOLOGY REPORT*  Clinical Data: Altered mental status.  Seizure.  CHEST - 1 VIEW  Comparison: 01/20/2012  Findings: The lungs are clear without focal infiltrate, edema, pneumothorax or pleural effusion. Interstitial markings are diffusely coarsened with chronic features. Cardiopericardial silhouette is at upper limits of normal for size. Imaged bony structures of the thorax are intact. Telemetry leads overlie the chest.  IMPRESSION: Underlying chronic interstitial coarsening.  No acute cardiopulmonary process.   Original Report Authenticated By: ERIC A. MANSELL, M.D.    Ct Head Wo Contrast  02/21/2012  *RADIOLOGY REPORT*  Clinical Data: Altered mental status.  Seizure activity.  CT HEAD WITHOUT CONTRAST  Technique:  Contiguous axial images were obtained from the base of the skull through the vertex without contrast.  Comparison: None.  Findings: There is no evidence for acute hemorrhage, hydrocephalus, mass lesion, or abnormal extra-axial fluid collection.  No definite CT evidence for acute infarction.  The visualized paranasal sinuses and mastoid air cells are clear.  IMPRESSION: Normal exam.   Original Report Authenticated By: ERIC A. MANSELL, M.D.    Mr Brain Wo Contrast  02/21/2012  *RADIOLOGY REPORT*  Clinical Data: Possible seizure yesterday.  MRI HEAD WITHOUT CONTRAST  Technique:  Multiplanar, multiecho pulse sequences of the brain and surrounding structures were obtained according to standard protocol without intravenous contrast.  Comparison: 02/21/2012 CT head  Findings: The patient had difficulty remaining motionless for the study.  Images are suboptimal.  Small  or subtle lesions could be overlooked.  There is no evidence for acute infarction, intracranial hemorrhage, mass lesion, hydrocephalus, or extra-axial fluid.  There is no significant atrophy or white matter disease.  Symmetric temporal lobes within limits of evaluation due to degraded images.  Major intracranial vascular structures widely patent.  Clear sinuses and mastoids.  IMPRESSION: Negative exam within limits of evaluation due to patient motion. No visible temporal lobe abnormality or acute intracranial findings.   Original Report Authenticated By: Elsie Stain, M.D.     Microbiology: No results found for this or any previous visit (from the past 240 hour(s)).   Labs: Basic Metabolic Panel:  Lab 02/22/12 1610 02/20/12 2354  NA 139 141  K 3.2* 3.6  CL 108 108  CO2 20 21  GLUCOSE 94 114*  BUN 21 15  CREATININE 0.69 0.65  CALCIUM 8.6 9.6  MG -- --  PHOS -- --   Liver Function Tests:  Lab 02/22/12 0533 02/21/12 0133  AST 8 10  ALT 7 10  ALKPHOS 87 107  BILITOT 0.4 0.4  PROT 5.7* 7.0  ALBUMIN 3.0* 3.8   No results found for this basename: LIPASE:5,AMYLASE:5 in the last 168 hours  Lab 02/21/12 0134  AMMONIA 21   CBC:  Lab 02/22/12 0533 02/20/12 2354  WBC 7.5 10.5  NEUTROABS -- --  HGB 13.5 16.4*  HCT 40.2 47.6*  MCV 92.6 91.7  PLT 241 243   Cardiac Enzymes: No results found for this basename: CKTOTAL:5,CKMB:5,CKMBINDEX:5,TROPONINI:5 in the last 168 hours BNP: BNP (last 3 results) No results found for this basename: PROBNP:3 in the last 8760 hours CBG: No results found for this basename: GLUCAP:5 in the last 168 hours  Time coordinating discharge: Greater than 30 minutes  Signed:  Brain Honeycutt  [facility] 02/22/2012, 11:32 AM

## 2012-02-22 NOTE — Progress Notes (Signed)
Discharge instructions given to patient with son at bedside. No questions unanswered. Patient acknowledged f/u appointments and new medications. Patient waiting for ride.

## 2012-02-22 NOTE — Progress Notes (Signed)
Patient left with husband and son. Taken out of facility by staff via wheelchair.

## 2012-02-22 NOTE — Discharge Instructions (Signed)
Seizure, Adult  A seizure is abnormal electrical activity in the brain. Seizures can cause a change in attention or behavior (altered mental status). Seizures often involve uncontrollable shaking (convulsions). Seizures usually last from 30 seconds to 2 minutes. Epilepsy is a brain disorder in which a patient has repeated seizures over time.  CAUSES   There are many different problems that can cause seizures. In some cases, no cause is found. Common causes of seizures include:   Head injuries.   Brain tumors.   Infections.   Imbalance of chemicals in the blood.   Kidney failure or liver failure.   Heart disease.   Drug abuse.   Stroke.   Withdrawal from certain drugs or alcohol.   Birth defects.   Malfunction of a neurosurgical device placed in the brain.  SYMPTOMS   Symptoms vary depending on the part of the brain that is involved. Right before a seizure, you may have a warning (aura) that a seizure is about to occur. An aura may include the following symptoms:    Fear or anxiety.   Nausea.   Feeling like the room is spinning (vertigo).   Vision changes, such as seeing flashing lights or spots.  Common symptoms during a seizure include:   Convulsions.   Drooling.   Rapid eye movements.   Grunting.   Loss of bladder and bowel control.   Bitter taste in the mouth.  After a seizure, you may feel confused and sleepy. You may also have an injury resulting from convulsions during the seizure.  DIAGNOSIS   Your caregiver will perform a physical exam and run some tests to determine the type and cause of your seizure. These tests may include:   Blood tests.   A lumbar puncture test. In this test, a small amount of fluid is removed from the spine and examined.   Electrocardiography (ECG). This test records the electrical activity in your heart.   Imaging tests, such as computed tomography (CT) scans or magnetic resonance imaging (MRI).   Electroencephalography (EEG). This test records the electrical  activity in your brain.  TREATMENT   Seizures usually stop on their own. Treatment will depend on the cause of your seizure. In some cases, medicine may be given to prevent future seizures.  HOME CARE INSTRUCTIONS    If you are given medicines, take them exactly as prescribed by your caregiver.   Keep all follow-up appointments as directed by your caregiver.   Do not swim or drive until your caregiver says it is okay.   Teach friends and family what to do if you have a seizure. They should:   Lay you on the ground to prevent a fall.   Put a cushion under your head.   Loosen any tight clothing around your neck.   Turn you on your side. If vomiting occurs, this helps keep your airway clear.   Stay with you until you recover.  SEEK IMMEDIATE MEDICAL CARE IF:   The seizure lasts longer than 2 to 5 minutes.   The seizure is severe or the person does not wake up after the seizure.   The person has altered mental status.  Drive the person to the emergency department or call your local emergency services (911 in [place]  MAKE SURE YOU:   Understand these instructions.   Will watch your condition.   Will get help right away if you are not doing well or get worse.  Document Released: 05/06/2000 Document Revised: 08/01/2011 Document   Reviewed: 04/27/2011  [employer] Patient Information 2013 [employer]

## 2012-02-24 ENCOUNTER — Encounter: Payer: Self-pay | Admitting: Internal Medicine

## 2012-04-05 ENCOUNTER — Encounter: Payer: Self-pay | Admitting: Internal Medicine

## 2012-08-29 ENCOUNTER — Encounter: Payer: Self-pay | Admitting: Internal Medicine

## 2012-11-21 ENCOUNTER — Encounter (HOSPITAL_COMMUNITY): Payer: Self-pay | Admitting: *Deleted

## 2012-11-21 ENCOUNTER — Emergency Department (HOSPITAL_COMMUNITY): Payer: Medicaid Other

## 2012-11-21 ENCOUNTER — Emergency Department (HOSPITAL_COMMUNITY)
Admission: EM | Admit: 2012-11-21 | Discharge: 2012-11-21 | Disposition: A | Discharge status: Home / Self Care | Payer: Medicaid Other | Attending: Emergency Medicine | Admitting: Emergency Medicine

## 2012-11-21 DIAGNOSIS — Z7982 Long term (current) use of aspirin: Secondary | ICD-10-CM | POA: Insufficient documentation

## 2012-11-21 DIAGNOSIS — E7800 Pure hypercholesterolemia, unspecified: Secondary | ICD-10-CM | POA: Insufficient documentation

## 2012-11-21 DIAGNOSIS — J029 Acute pharyngitis, unspecified: Secondary | ICD-10-CM | POA: Insufficient documentation

## 2012-11-21 DIAGNOSIS — R0789 Other chest pain: Secondary | ICD-10-CM | POA: Insufficient documentation

## 2012-11-21 DIAGNOSIS — R197 Diarrhea, unspecified: Secondary | ICD-10-CM | POA: Insufficient documentation

## 2012-11-21 DIAGNOSIS — Z8719 Personal history of other diseases of the digestive system: Secondary | ICD-10-CM | POA: Insufficient documentation

## 2012-11-21 DIAGNOSIS — J4541 Moderate persistent asthma with (acute) exacerbation: Secondary | ICD-10-CM

## 2012-11-21 DIAGNOSIS — R05 Cough: Secondary | ICD-10-CM | POA: Insufficient documentation

## 2012-11-21 DIAGNOSIS — F3289 Other specified depressive episodes: Secondary | ICD-10-CM | POA: Insufficient documentation

## 2012-11-21 DIAGNOSIS — R059 Cough, unspecified: Secondary | ICD-10-CM | POA: Insufficient documentation

## 2012-11-21 DIAGNOSIS — F329 Major depressive disorder, single episode, unspecified: Secondary | ICD-10-CM | POA: Insufficient documentation

## 2012-11-21 DIAGNOSIS — K219 Gastro-esophageal reflux disease without esophagitis: Secondary | ICD-10-CM | POA: Insufficient documentation

## 2012-11-21 DIAGNOSIS — Z77120 Contact with and (suspected) exposure to mold (toxic): Secondary | ICD-10-CM

## 2012-11-21 DIAGNOSIS — Z79899 Other long term (current) drug therapy: Secondary | ICD-10-CM | POA: Insufficient documentation

## 2012-11-21 DIAGNOSIS — J45901 Unspecified asthma with (acute) exacerbation: Secondary | ICD-10-CM | POA: Insufficient documentation

## 2012-11-21 DIAGNOSIS — F17200 Nicotine dependence, unspecified, uncomplicated: Secondary | ICD-10-CM | POA: Insufficient documentation

## 2012-11-21 LAB — CBC WITH DIFFERENTIAL/PLATELET
Eosinophils Absolute: 0 10*3/uL (ref 0.0–0.7)
Hemoglobin: 14.5 g/dL (ref 12.0–15.0)
Lymphs Abs: 0.9 10*3/uL (ref 0.7–4.0)
MCH: 31 pg (ref 26.0–34.0)
Monocytes Relative: 5 % (ref 3–12)
Neutro Abs: 14.1 10*3/uL — ABNORMAL HIGH (ref 1.7–7.7)
Neutrophils Relative %: 89 % — ABNORMAL HIGH (ref 43–77)
Platelets: 223 10*3/uL (ref 150–400)
RBC: 4.67 MIL/uL (ref 3.87–5.11)
WBC: 15.9 10*3/uL — ABNORMAL HIGH (ref 4.0–10.5)

## 2012-11-21 LAB — BASIC METABOLIC PANEL
Chloride: 101 mEq/L (ref 96–112)
GFR calc Af Amer: >90 mL/min (ref >90)
GFR calc non Af Amer: >90 mL/min (ref >90)
Glucose, Bld: 116 mg/dL — ABNORMAL HIGH (ref 70–99)
Potassium: 3.4 mEq/L — ABNORMAL LOW (ref 3.5–5.1)
Sodium: 138 mEq/L (ref 135–145)

## 2012-11-21 MED ORDER — IPRATROPIUM BROMIDE 0.02 % IN SOLN
0.5000 mg | Freq: Once | RESPIRATORY_TRACT | Status: AC
  Administered 2012-11-21: 0.5 mg via RESPIRATORY_TRACT
  Filled 2012-11-21: qty 2.5

## 2012-11-21 MED ORDER — ALBUTEROL SULFATE (5 MG/ML) 0.5% IN NEBU
2.5000 mg | INHALATION_SOLUTION | Freq: Once | RESPIRATORY_TRACT | Status: AC
  Administered 2012-11-21: 2.5 mg via RESPIRATORY_TRACT
  Filled 2012-11-21: qty 0.5

## 2012-11-21 MED ORDER — DOXYCYCLINE HYCLATE 100 MG PO CAPS: 100.0000 mg | ORAL_CAPSULE | Freq: Two times a day (BID) | ORAL | Status: DC

## 2012-11-21 MED ORDER — PREDNISONE 20 MG PO TABS: ORAL_TABLET | ORAL | Status: DC

## 2012-11-21 MED ORDER — ALBUTEROL SULFATE (5 MG/ML) 0.5% IN NEBU
5.0000 mg | INHALATION_SOLUTION | Freq: Once | RESPIRATORY_TRACT | Status: AC
  Administered 2012-11-21: 5 mg via RESPIRATORY_TRACT
  Filled 2012-11-21: qty 0.5

## 2012-11-21 MED ORDER — PREDNISONE 50 MG PO TABS
60.0000 mg | ORAL_TABLET | Freq: Once | ORAL | Status: AC
  Administered 2012-11-21: 50 mg via ORAL
  Filled 2012-11-21: qty 1

## 2012-11-21 NOTE — ED Provider Notes (Signed)
History  This chart was scribed for Ward Givens, MD by Bennett Scrape, ED Scribe. This patient was seen in room APA19/APA19 and the patient's care was started at 12:37 PM.  CSN: [id number] Arrival date & time 11/21/12  1219  First MD Initiated Contact with Patient 11/21/12 1237     Chief Complaint  Patient presents with  . Asthma    Patient is a 52 y.o. female presenting with asthma. The history is provided by the patient. No language interpreter was used.  Asthma This is a recurrent problem. The current episode started more than 1 week ago. The problem occurs constantly. The problem has been gradually worsening. Associated symptoms include shortness of breath. Pertinent negatives include no chest pain. The symptoms are aggravated by exertion. Nothing relieves the symptoms. Treatments tried: inhalers, antibiotic and steroids  The treatment provided mild relief.    HPI Comments: Theresa Pace is a 52 y.o. female who with a h/o asthma for the past 20 years presents to the Emergency Department complaining of 3 weeks of gradual onset, gradually worsening, constant She reports associated cough productive of green/yellow sputum, chest tightness and sore throat. She denies rhinorrhea. She describes dyspnea on exertion. Pt reports that she has been using Advair, Claritin, Singular and Proair with no improvement. She denies trying Spirva and states that she has been on Advair for the past 2 to 3 years. She reports having a pulmonologist several years ago but has not been seen within the past 2 to 3 years. She states that she finished 750 mg Levaquin and prednisone with no improvement. She states that usually the prednisone and the levaquin resolves her symptoms. Last admission for asthma was 2 years ago. Pt also expresses concern over her home having mold. She reports chronic diarrhea from IBS but denies changes. She denies fever, nausea and emesis as associated symptoms. She is 0.5ppd smoker.   PCP is  Dr. Reuel Boom  Past Medical History  Diagnosis Date  . Asthma   . Depression   . Frequent urination at night   . IBS (irritable bowel syndrome)   . GERD (gastroesophageal reflux disease)   . Hypercholesteremia    Past Surgical History  Procedure Laterality Date  . Abdominal hysterectomy    . Appendectomy     History reviewed. No pertinent family history. History  Substance Use Topics  . Smoking status: Current Every Day Smoker    Types: Cigarettes  . Smokeless tobacco: Not on file  . 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   Review of Systems  Respiratory: Positive for cough, chest tightness and shortness of breath.   Cardiovascular: Negative for chest pain.  Gastrointestinal: Positive for diarrhea (chronic). Negative for nausea and vomiting.  All other systems reviewed and are negative.    Allergies  Methadone and Aspirin  Home Medications   Current Outpatient Rx  Name  Route  Sig  Dispense  Refill  . albuterol (PROVENTIL HFA;VENTOLIN HFA) 108 (90 BASE) MCG/ACT inhaler   Inhalation   Inhale 2 puffs into the lungs every 6 (six) hours as needed.         Marland Kitchen albuterol (PROVENTIL) (2.5 MG/3ML) 0.083% nebulizer solution   Nebulization   Take 2.5 mg by nebulization every 6 (six) hours as needed for wheezing.         Marland Kitchen ALPRAZolam (XANAX) 1 MG tablet   Oral   Take 1 mg  by mouth 3 (three) times daily.         Marland Kitchen amphetamine-dextroamphetamine (ADDERALL XR) 30 MG 24 hr capsule   Oral   Take 90 mg by mouth every morning.         Marland Kitchen aspirin EC 81 MG tablet   Oral   Take 81 mg by mouth daily.         . DULoxetine (CYMBALTA) 30 MG capsule   Oral   Take 30 mg by mouth daily.         . Fluticasone-Salmeterol (ADVAIR) 250-50 MCG/DOSE AEPB   Inhalation   Inhale 1 puff into the lungs every 12 (twelve) hours.         Marland Kitchen loratadine (CLARITIN) 10 MG tablet   Oral   Take 10 mg by mouth daily.         .  montelukast (SINGULAIR) 10 MG tablet   Oral   Take 10 mg by mouth at bedtime.         . naphazoline (NAPHCON) 0.1 % ophthalmic solution   Both Eyes   Place 1 drop into both eyes 4 (four) times daily as needed.         Marland Kitchen omeprazole (PRILOSEC) 20 MG capsule   Oral   Take 20 mg by mouth daily.         Marland Kitchen oxyCODONE-acetaminophen (PERCOCET) 10-325 MG per tablet   Oral   Take 1 tablet by mouth every 6 (six) hours as needed. Back pain         . QUEtiapine Fumarate (SEROQUEL XR) 150 MG 24 hr tablet   Oral   Take 150 mg by mouth at bedtime.         . rosuvastatin (CRESTOR) 20 MG tablet   Oral   Take 20 mg by mouth daily.         . traZODone (DESYREL) 50 MG tablet   Oral   Take 100 mg by mouth at bedtime.          Triage Vitals: BP 118/86  Pulse 117  Temp(Src) 98.5 F (36.9 C) (Oral)  Resp 20  Ht 5\' 2"  (1.575 m)  Wt 138 lb (62.596 kg)  BMI 25.23 kg/m2  SpO2 99%  Vital signs normal    Physical Exam  Nursing note and vitals reviewed. Constitutional: She is oriented to person, place, and time. She appears well-developed and well-nourished.  Non-toxic appearance. She does not appear ill. No distress.  HENT:  Head: Normocephalic and atraumatic.  Right Ear: External ear normal.  Left Ear: External ear normal.  Nose: Nose normal. No mucosal edema or rhinorrhea.  Mouth/Throat: Oropharynx is clear and moist and mucous membranes are normal. No dental abscesses or edematous.  Eyes: Conjunctivae and EOM are normal. Pupils are equal, round, and reactive to light.  Neck: Normal range of motion and full passive range of motion without pain. Neck supple.  Cardiovascular: Normal rate, regular rhythm and normal heart sounds.  Exam reveals no gallop and no friction rub.   No murmur heard. Pulmonary/Chest: Effort normal. No respiratory distress. She has decreased breath sounds. She has no wheezes. She has no rhonchi. She has no rales. She exhibits no tenderness and no crepitus.   Abdominal: Soft. Normal appearance and bowel sounds are normal. She exhibits no distension. There is no tenderness. There is no rebound and no guarding.  Musculoskeletal: Normal range of motion. She exhibits no edema and no tenderness.  Moves all extremities well.   Neurological: She  is alert and oriented to person, place, and time. She has normal strength. No cranial nerve deficit.  Skin: Skin is warm, dry and intact. No rash noted. No erythema. No pallor.  Psychiatric: She has a normal mood and affect. Her speech is normal and behavior is normal. Her mood appears not anxious.    ED Course  Procedures (including critical care time)  Medications  predniSONE (DELTASONE) tablet 60 mg (not administered)  albuterol (PROVENTIL) (5 MG/ML) 0.5% nebulizer solution 5 mg (5 mg Nebulization Given 11/21/12 1304)  ipratropium (ATROVENT) nebulizer solution 0.5 mg (0.5 mg Nebulization Given 11/21/12 1305)  albuterol (PROVENTIL) (5 MG/ML) 0.5% nebulizer solution 2.5 mg (2.5 mg Nebulization Given 11/21/12 1550)  ipratropium (ATROVENT) nebulizer solution 0.5 mg (0.5 mg Nebulization Given 11/21/12 1550)    DIAGNOSTIC STUDIES: Oxygen Saturation is 99% on room air, normal by my interpretation.    COORDINATION OF CARE: 12:53 PM-Discussed treatment plan which includes breathing treatment, CXR, CBC panel and BMP with pt at bedside and pt agreed to plan.   Recheck after first nebulizer has improved air movement and few rhonchi at bases.   Pt was ambulated by ED staff and her pulse ox was 96-97 % on RA and they state she did well until she got back into her stretcher then she felt more SOB. She was given another nebulizer.   16:30 has rare rhonchi after second nebulizer. Feels ready to go home. Asking for percocet for her leg pain, she is advised to see her doctor to get more if she has run out.   Results for orders placed during the hospital encounter of 11/21/12  CBC WITH DIFFERENTIAL      Result Value Range    WBC 15.9 (*) 4.0 - 10.5 K/uL   RBC 4.67  3.87 - 5.11 MIL/uL   Hemoglobin 14.5  12.0 - 15.0 g/dL   HCT 45.4  09.8 - 11.9 %   MCV 90.4  78.0 - 100.0 fL   MCH 31.0  26.0 - 34.0 pg   MCHC 34.4  30.0 - 36.0 g/dL   RDW 14.7  82.9 - 56.2 %   Platelets 223  150 - 400 K/uL   Neutrophils Relative % 89 (*) 43 - 77 %   Neutro Abs 14.1 (*) 1.7 - 7.7 K/uL   Lymphocytes Relative 5 (*) 12 - 46 %   Lymphs Abs 0.9  0.7 - 4.0 K/uL   Monocytes Relative 5  3 - 12 %   Monocytes Absolute 0.9  0.1 - 1.0 K/uL   Eosinophils Relative 0  0 - 5 %   Eosinophils Absolute 0.0  0.0 - 0.7 K/uL   Basophils Relative 0  0 - 1 %   Basophils Absolute 0.0  0.0 - 0.1 K/uL  BASIC METABOLIC PANEL      Result Value Range   Sodium 138  135 - 145 mEq/L   Potassium 3.4 (*) 3.5 - 5.1 mEq/L   Chloride 101  96 - 112 mEq/L   CO2 24  19 - 32 mEq/L   Glucose, Bld 116 (*) 70 - 99 mg/dL   BUN 18  6 - 23 mg/dL   Creatinine, Ser 1.30  0.50 - 1.10 mg/dL   Calcium 9.1  8.4 - 86.5 mg/dL   GFR calc non Af Amer >90  >90 mL/min   GFR calc Af Amer >90  >90 mL/min   Laboratory interpretation all normal except leukocytosis (on steroids), mild hypokalemia  Dg Chest 2 View  11/21/2012   *RADIOLOGY REPORT*  Clinical Data: Asthma, quit smoking 2 weeks ago  CHEST - 2 VIEW  Comparison: Portable chest x-ray of 02/21/2012  Findings: No active infiltrate or effusion is seen.  Minimally prompt markings of the right middle lobe or lingula most likely reflects scarring but attention to this area on follow-up chest x- ray is recommended.  Mediastinal contours are stable.  There is some peribronchial thickening present most likely indicating bronchitis.  The heart is within upper limits of normal.  No acute bony abnormality is seen.  IMPRESSION: No active lung disease.  Probable bronchitis most likely chronic. Minimally prominent markings overlying the right middle lobe or lingula on the lateral view probably represent scarring.  Recommend attention this area  on follow-up chest x-ray.   Original Report Authenticated By: Dwyane Dee, M.D.    1. Asthma exacerbation, moderate persistent   2. Mold exposure     New Prescriptions   DOXYCYCLINE (VIBRAMYCIN) 100 MG CAPSULE    Take 1 capsule (100 mg total) by mouth 2 (two) times daily.   PREDNISONE (DELTASONE) 20 MG TABLET    Take 3 po QD x 2d starting tomorrow, then 2 po QD x 3d then 1 po QD x 3d    Plan discharge   Devoria Albe, MD, FACEP   MDM  patient has history of asthma and continues to smoke. She has had a flareup of the last few weeks and has been on antibiotics already which was Levaquin which she finished just a few days ago and also a course of prednisone.     I personally performed the services described in this documentation, which was scribed in my presence. The recorded information has been reviewed and considered.  Devoria Albe, MD, Armando Gang    Ward Givens, MD 11/21/12 [phone]

## 2012-11-21 NOTE — ED Notes (Signed)
Patient ambulated in hall.  O2 sat maintained at 96-97%.  Patient stated she did not have any SOB until "she stopped walking".  Patient did not appear to be in any respiratory distress.

## 2012-11-21 NOTE — ED Notes (Signed)
Sick with asthma sx for 3 weeks, given  Prednisone x2 and finished levaquin.    And feels she is no better.  Yellow ,green sputum, fever,

## 2012-11-21 NOTE — ED Notes (Signed)
The patient states that she has been having increasing shortness of breath over the last 3 weeks.  States that she has been seeing her doctor for this problem and has been placed on steroids and Levaquin and has not improved since then.

## 2013-02-14 DIAGNOSIS — G894 Chronic pain syndrome: Secondary | ICD-10-CM | POA: Insufficient documentation

## 2013-02-14 DIAGNOSIS — M4317 Spondylolisthesis, lumbosacral region: Secondary | ICD-10-CM | POA: Insufficient documentation

## 2013-02-14 DIAGNOSIS — Z79899 Other long term (current) drug therapy: Secondary | ICD-10-CM | POA: Insufficient documentation

## 2013-03-13 DIAGNOSIS — M25561 Pain in right knee: Secondary | ICD-10-CM | POA: Insufficient documentation

## 2014-03-21 ENCOUNTER — Ambulatory Visit: Payer: Self-pay | Admitting: Podiatrist

## 2014-05-23 HISTORY — PX: COLONOSCOPY: SHX174

## 2015-02-16 ENCOUNTER — Emergency Department (HOSPITAL_COMMUNITY): Payer: Medicaid Other

## 2015-02-16 ENCOUNTER — Encounter (HOSPITAL_COMMUNITY): Payer: Self-pay | Admitting: Emergency Medicine

## 2015-02-16 ENCOUNTER — Inpatient Hospital Stay (HOSPITAL_COMMUNITY)
Admission: EM | Admit: 2015-02-16 | Discharge: 2015-02-20 | DRG: 193 | Disposition: A | Discharge status: Home / Self Care | Payer: Medicaid Other | Attending: Family Medicine | Admitting: Family Medicine

## 2015-02-16 DIAGNOSIS — K219 Gastro-esophageal reflux disease without esophagitis: Secondary | ICD-10-CM | POA: Diagnosis present

## 2015-02-16 DIAGNOSIS — J9601 Acute respiratory failure with hypoxia: Secondary | ICD-10-CM | POA: Diagnosis not present

## 2015-02-16 DIAGNOSIS — Z79899 Other long term (current) drug therapy: Secondary | ICD-10-CM | POA: Diagnosis not present

## 2015-02-16 DIAGNOSIS — J157 Pneumonia due to Mycoplasma pneumoniae: Principal | ICD-10-CM

## 2015-02-16 DIAGNOSIS — J209 Acute bronchitis, unspecified: Secondary | ICD-10-CM | POA: Diagnosis present

## 2015-02-16 DIAGNOSIS — F17210 Nicotine dependence, cigarettes, uncomplicated: Secondary | ICD-10-CM | POA: Diagnosis present

## 2015-02-16 DIAGNOSIS — F329 Major depressive disorder, single episode, unspecified: Secondary | ICD-10-CM | POA: Diagnosis present

## 2015-02-16 DIAGNOSIS — J441 Chronic obstructive pulmonary disease with (acute) exacerbation: Secondary | ICD-10-CM | POA: Diagnosis not present

## 2015-02-16 DIAGNOSIS — Z79891 Long term (current) use of opiate analgesic: Secondary | ICD-10-CM | POA: Diagnosis not present

## 2015-02-16 DIAGNOSIS — J45909 Unspecified asthma, uncomplicated: Secondary | ICD-10-CM | POA: Diagnosis present

## 2015-02-16 DIAGNOSIS — I10 Essential (primary) hypertension: Secondary | ICD-10-CM | POA: Diagnosis present

## 2015-02-16 DIAGNOSIS — J449 Chronic obstructive pulmonary disease, unspecified: Secondary | ICD-10-CM | POA: Diagnosis not present

## 2015-02-16 DIAGNOSIS — Z7982 Long term (current) use of aspirin: Secondary | ICD-10-CM | POA: Diagnosis not present

## 2015-02-16 DIAGNOSIS — Z7952 Long term (current) use of systemic steroids: Secondary | ICD-10-CM | POA: Diagnosis not present

## 2015-02-16 DIAGNOSIS — J440 Chronic obstructive pulmonary disease with acute lower respiratory infection: Secondary | ICD-10-CM | POA: Diagnosis present

## 2015-02-16 DIAGNOSIS — R569 Unspecified convulsions: Secondary | ICD-10-CM | POA: Diagnosis present

## 2015-02-16 DIAGNOSIS — F419 Anxiety disorder, unspecified: Secondary | ICD-10-CM | POA: Diagnosis present

## 2015-02-16 DIAGNOSIS — E785 Hyperlipidemia, unspecified: Secondary | ICD-10-CM | POA: Diagnosis present

## 2015-02-16 DIAGNOSIS — I509 Heart failure, unspecified: Secondary | ICD-10-CM | POA: Diagnosis not present

## 2015-02-16 DIAGNOSIS — R0602 Shortness of breath: Secondary | ICD-10-CM | POA: Diagnosis present

## 2015-02-16 HISTORY — DX: Chronic obstructive pulmonary disease, unspecified: J44.9

## 2015-02-16 LAB — BASIC METABOLIC PANEL
Anion gap: 12 (ref 5–15)
BUN: 19 mg/dL (ref 6–20)
CALCIUM: 8.8 mg/dL — AB (ref 8.9–10.3)
CHLORIDE: 102 mmol/L (ref 101–111)
CO2: 24 mmol/L (ref 22–32)
CREATININE: 0.91 mg/dL (ref 0.44–1.00)
GFR calc Af Amer: >60 mL/min (ref >60)
GFR calc non Af Amer: >60 mL/min (ref >60)
Glucose, Bld: 146 mg/dL — ABNORMAL HIGH (ref 65–99)
Potassium: 4 mmol/L (ref 3.5–5.1)
SODIUM: 138 mmol/L (ref 135–145)

## 2015-02-16 LAB — CBC
HCT: 46.7 % — ABNORMAL HIGH (ref 36.0–46.0)
Hemoglobin: 15.7 g/dL — ABNORMAL HIGH (ref 12.0–15.0)
MCH: 30.3 pg (ref 26.0–34.0)
MCHC: 33.6 g/dL (ref 30.0–36.0)
MCV: 90.2 fL (ref 78.0–100.0)
PLATELETS: 224 10*3/uL (ref 150–400)
RBC: 5.18 MIL/uL — ABNORMAL HIGH (ref 3.87–5.11)
RDW: 13.9 % (ref 11.5–15.5)
WBC: 7 10*3/uL (ref 4.0–10.5)

## 2015-02-16 LAB — BRAIN NATRIURETIC PEPTIDE: B Natriuretic Peptide: 89 pg/mL (ref 0.0–100.0)

## 2015-02-16 MED ORDER — ALBUTEROL SULFATE (2.5 MG/3ML) 0.083% IN NEBU
2.5000 mg | INHALATION_SOLUTION | Freq: Once | RESPIRATORY_TRACT | Status: AC
  Administered 2015-02-16: 2.5 mg via RESPIRATORY_TRACT
  Filled 2015-02-16: qty 3

## 2015-02-16 MED ORDER — IPRATROPIUM-ALBUTEROL 0.5-2.5 (3) MG/3ML IN SOLN
3.0000 mL | Freq: Four times a day (QID) | RESPIRATORY_TRACT | Status: DC
  Administered 2015-02-16 – 2015-02-18 (×8): 3 mL via RESPIRATORY_TRACT
  Filled 2015-02-16 (×8): qty 3

## 2015-02-16 MED ORDER — OXYCODONE-ACETAMINOPHEN 5-325 MG PO TABS
1.0000 | ORAL_TABLET | Freq: Four times a day (QID) | ORAL | Status: DC | PRN
Start: 2015-02-16 — End: 2015-02-20
  Administered 2015-02-16 – 2015-02-20 (×13): 1 via ORAL
  Filled 2015-02-16 (×14): qty 1

## 2015-02-16 MED ORDER — IPRATROPIUM-ALBUTEROL 0.5-2.5 (3) MG/3ML IN SOLN
3.0000 mL | Freq: Once | RESPIRATORY_TRACT | Status: AC
  Administered 2015-02-16: 3 mL via RESPIRATORY_TRACT
  Filled 2015-02-16: qty 3

## 2015-02-16 MED ORDER — MIRABEGRON ER 25 MG PO TB24
50.0000 mg | ORAL_TABLET | Freq: Every day | ORAL | Status: DC
  Administered 2015-02-17 – 2015-02-20 (×4): 50 mg via ORAL
  Filled 2015-02-16 (×4): qty 2

## 2015-02-16 MED ORDER — ALBUTEROL SULFATE (2.5 MG/3ML) 0.083% IN NEBU: 2.5000 mg | INHALATION_SOLUTION | RESPIRATORY_TRACT | Status: DC | PRN

## 2015-02-16 MED ORDER — ALBUTEROL SULFATE (2.5 MG/3ML) 0.083% IN NEBU: 2.5000 mg | INHALATION_SOLUTION | Freq: Four times a day (QID) | RESPIRATORY_TRACT | Status: DC

## 2015-02-16 MED ORDER — AZITHROMYCIN 500 MG IV SOLR
500.0000 mg | INTRAVENOUS | Status: DC
  Administered 2015-02-16: 500 mg via INTRAVENOUS
  Filled 2015-02-16 (×2): qty 500

## 2015-02-16 MED ORDER — ROSUVASTATIN CALCIUM 20 MG PO TABS
20.0000 mg | ORAL_TABLET | Freq: Every day | ORAL | Status: DC
  Administered 2015-02-17 – 2015-02-19 (×3): 20 mg via ORAL
  Filled 2015-02-16 (×3): qty 1

## 2015-02-16 MED ORDER — DEXTROSE 5 % IV SOLN
INTRAVENOUS | Status: AC
  Filled 2015-02-16: qty 500

## 2015-02-16 MED ORDER — INFLUENZA VAC SPLIT QUAD 0.5 ML IM SUSY
0.5000 mL | PREFILLED_SYRINGE | INTRAMUSCULAR | Status: AC
  Administered 2015-02-17: 0.5 mL via INTRAMUSCULAR
  Filled 2015-02-16: qty 0.5

## 2015-02-16 MED ORDER — ONDANSETRON HCL 4 MG/2ML IJ SOLN: 4.0000 mg | Freq: Four times a day (QID) | INTRAMUSCULAR | Status: DC | PRN

## 2015-02-16 MED ORDER — AMPHETAMINE-DEXTROAMPHET ER 30 MG PO CP24
90.0000 mg | ORAL_CAPSULE | ORAL | Status: DC
  Administered 2015-02-17 – 2015-02-18 (×2): 90 mg via ORAL
  Filled 2015-02-16 (×5): qty 3

## 2015-02-16 MED ORDER — OXYCODONE-ACETAMINOPHEN 10-325 MG PO TABS: 1.0000 | ORAL_TABLET | Freq: Four times a day (QID) | ORAL | Status: DC | PRN

## 2015-02-16 MED ORDER — LORATADINE 10 MG PO TABS
10.0000 mg | ORAL_TABLET | Freq: Every day | ORAL | Status: DC
  Administered 2015-02-17 – 2015-02-20 (×4): 10 mg via ORAL
  Filled 2015-02-16 (×4): qty 1

## 2015-02-16 MED ORDER — FUROSEMIDE 10 MG/ML IJ SOLN
20.0000 mg | Freq: Two times a day (BID) | INTRAMUSCULAR | Status: AC
  Administered 2015-02-16 – 2015-02-17 (×2): 20 mg via INTRAVENOUS
  Filled 2015-02-16 (×2): qty 2

## 2015-02-16 MED ORDER — ALPRAZOLAM 1 MG PO TABS
1.0000 mg | ORAL_TABLET | Freq: Three times a day (TID) | ORAL | Status: DC
  Administered 2015-02-17 (×2): 1 mg via ORAL
  Filled 2015-02-16 (×2): qty 1

## 2015-02-16 MED ORDER — GUAIFENESIN ER 600 MG PO TB12
600.0000 mg | ORAL_TABLET | Freq: Two times a day (BID) | ORAL | Status: DC
  Administered 2015-02-16 – 2015-02-20 (×8): 600 mg via ORAL
  Filled 2015-02-16 (×8): qty 1

## 2015-02-16 MED ORDER — SODIUM CHLORIDE 0.9 % IV SOLN: 250.0000 mL | INTRAVENOUS | Status: DC | PRN

## 2015-02-16 MED ORDER — IPRATROPIUM BROMIDE 0.02 % IN SOLN: 0.5000 mg | Freq: Four times a day (QID) | RESPIRATORY_TRACT | Status: DC

## 2015-02-16 MED ORDER — SODIUM CHLORIDE 0.9 % IJ SOLN
3.0000 mL | Freq: Two times a day (BID) | INTRAMUSCULAR | Status: DC
  Administered 2015-02-16 – 2015-02-20 (×8): 3 mL via INTRAVENOUS

## 2015-02-16 MED ORDER — ENOXAPARIN SODIUM 40 MG/0.4ML ~~LOC~~ SOLN
40.0000 mg | SUBCUTANEOUS | Status: DC
  Administered 2015-02-16 – 2015-02-19 (×4): 40 mg via SUBCUTANEOUS
  Filled 2015-02-16 (×4): qty 0.4

## 2015-02-16 MED ORDER — ONDANSETRON HCL 4 MG PO TABS: 4.0000 mg | ORAL_TABLET | Freq: Four times a day (QID) | ORAL | Status: DC | PRN

## 2015-02-16 MED ORDER — METHYLPREDNISOLONE SODIUM SUCC 125 MG IJ SOLR
60.0000 mg | Freq: Four times a day (QID) | INTRAMUSCULAR | Status: DC
  Administered 2015-02-17 – 2015-02-19 (×9): 60 mg via INTRAVENOUS
  Filled 2015-02-16 (×9): qty 2

## 2015-02-16 MED ORDER — SODIUM CHLORIDE 0.9 % IJ SOLN: 3.0000 mL | INTRAMUSCULAR | Status: DC | PRN

## 2015-02-16 MED ORDER — METHYLPREDNISOLONE SODIUM SUCC 125 MG IJ SOLR
125.0000 mg | Freq: Once | INTRAMUSCULAR | Status: AC
  Administered 2015-02-16: 125 mg via INTRAVENOUS
  Filled 2015-02-16: qty 2

## 2015-02-16 MED ORDER — OXYCODONE HCL 5 MG PO TABS
5.0000 mg | ORAL_TABLET | Freq: Four times a day (QID) | ORAL | Status: DC | PRN
  Administered 2015-02-16 – 2015-02-20 (×14): 5 mg via ORAL
  Filled 2015-02-16 (×14): qty 1

## 2015-02-16 MED ORDER — ASPIRIN EC 81 MG PO TBEC
81.0000 mg | DELAYED_RELEASE_TABLET | Freq: Every day | ORAL | Status: DC
  Administered 2015-02-17 – 2015-02-20 (×4): 81 mg via ORAL
  Filled 2015-02-16 (×4): qty 1

## 2015-02-16 MED ORDER — TRAZODONE HCL 50 MG PO TABS
150.0000 mg | ORAL_TABLET | Freq: Every day | ORAL | Status: DC
  Administered 2015-02-17 – 2015-02-19 (×4): 150 mg via ORAL
  Filled 2015-02-16 (×4): qty 3

## 2015-02-16 MED ORDER — ALBUTEROL SULFATE (2.5 MG/3ML) 0.083% IN NEBU
5.0000 mg | INHALATION_SOLUTION | Freq: Once | RESPIRATORY_TRACT | Status: AC
  Administered 2015-02-16: 5 mg via RESPIRATORY_TRACT
  Filled 2015-02-16: qty 6

## 2015-02-16 MED ORDER — NAPHAZOLINE HCL 0.1 % OP SOLN
1.0000 [drp] | Freq: Four times a day (QID) | OPHTHALMIC | Status: DC | PRN
  Filled 2015-02-16: qty 15

## 2015-02-16 MED ORDER — HYDRALAZINE HCL 25 MG PO TABS: 25.0000 mg | ORAL_TABLET | Freq: Four times a day (QID) | ORAL | Status: DC | PRN

## 2015-02-16 NOTE — H&P (Signed)
PCP:   Gar Ponto, MD   Chief Complaint:  Shortness of breath   HPI: 54 year old female who   has a past medical history of Asthma; Depression; Frequent urination at night; IBS (irritable bowel syndrome); GERD (gastroesophageal reflux disease); Hypercholesteremia; and COPD (chronic obstructive pulmonary disease). Today presents to the hospital with chief complaint of shortness of breath for past 1 week. Patient says that she was diagnosed with pneumonia last month and was treated with moxifloxacin for 5 days, since then she has continued to cough intermittently. Over the past 1 week she was having worsening of shortness of breath and was seen by PCP 3 days ago on 02/14/2015 at that time she was prescribed prednisone taper along with Levaquin which did not improve the shortness of breath. Today in the ED a shunt found to have pulmonary edema versus mycoplasma pneumonia on chest x-ray. We should also found to be hypertensive, BNP 89 She denies fever, no dysuria urgency frequency of urination. Denies chest pain.  Allergies:   Allergies  Allergen Reactions  . Methadone Hives  . Aspirin Other (See Comments)    Stomach Pain      Past Medical History  Diagnosis Date  . Asthma   . Depression   . Frequent urination at night   . IBS (irritable bowel syndrome)   . GERD (gastroesophageal reflux disease)   . Hypercholesteremia   . COPD (chronic obstructive pulmonary disease)     Past Surgical History  Procedure Laterality Date  . Abdominal hysterectomy    . Appendectomy      Prior to Admission medications   Medication Sig Start Date End Date Taking? Authorizing Provider  albuterol (PROVENTIL HFA;VENTOLIN HFA) 108 (90 BASE) MCG/ACT inhaler Inhale 2 puffs into the lungs every 6 (six) hours as needed.   Yes Historical Provider, MD  ALPRAZolam Duanne Moron) 1 MG tablet Take 1 mg by mouth 3 (three) times daily.   Yes Historical Provider, MD  amphetamine-dextroamphetamine (ADDERALL XR) 30  MG 24 hr capsule Take 90 mg by mouth every morning.   Yes Historical Provider, MD  aspirin EC 81 MG tablet Take 81 mg by mouth daily.   Yes Historical Provider, MD  ciclopirox (PENLAC) 8 % solution Apply 1 application topically at bedtime. Apply over nail and surrounding skin. Apply daily over previous coat. After seven (7) days, may remove with alcohol and continue cycle.   Yes Historical Provider, MD  fluticasone-salmeterol (ADVAIR HFA) 230-21 MCG/ACT inhaler Inhale 2 puffs into the lungs 2 (two) times daily.   Yes Historical Provider, MD  halobetasol (ULTRAVATE) 0.05 % cream Apply 1 application topically 2 (two) times daily. eczema   Yes Historical Provider, MD  ipratropium-albuterol (DUONEB) 0.5-2.5 (3) MG/3ML SOLN Take 3 mLs by nebulization every 4 (four) hours as needed.   Yes Historical Provider, MD  levofloxacin (LEVAQUIN) 750 MG tablet Take 750 mg by mouth daily. 5 DAY COURSE STARTING 02/14/15   Yes Historical Provider, MD  loratadine (CLARITIN) 10 MG tablet Take 10 mg by mouth daily.   Yes Historical Provider, MD  mirabegron ER (MYRBETRIQ) 50 MG TB24 tablet Take 50 mg by mouth daily.   Yes Historical Provider, MD  montelukast (SINGULAIR) 10 MG tablet Take 10 mg by mouth at bedtime.   Yes Historical Provider, MD  naphazoline (NAPHCON) 0.1 % ophthalmic solution Place 1 drop into both eyes 4 (four) times daily as needed for irritation or allergies.    Yes Historical Provider, MD  omeprazole (PRILOSEC) 20 MG capsule  Take 20 mg by mouth daily.   Yes Historical Provider, MD  oxyCODONE-acetaminophen (PERCOCET) 10-325 MG per tablet Take 1 tablet by mouth 3 (three) times daily.   Yes Historical Provider, MD  predniSONE (DELTASONE) 20 MG tablet Take 3 po QD x 2d starting tomorrow, then 2 po QD x 3d then 1 po QD x 3d Patient taking differently: Take 40 mg by mouth daily. 6 day course starting on 02/15/2015 11/21/12  Yes Rolland Porter, MD  rosuvastatin (CRESTOR) 20 MG tablet Take 20 mg by mouth daily.   Yes  Historical Provider, MD  traZODone (DESYREL) 50 MG tablet Take 150 mg by mouth at bedtime.    Yes Historical Provider, MD  tolterodine (DETROL) 2 MG tablet Take 2 mg by mouth 2 (two) times daily.    Historical Provider, MD    Social History:  reports that she has been smoking Cigarettes.  She has been smoking about 0.25 packs per day. She does not have any smokeless tobacco history on file. She reports that she drinks alcohol. She reports that she does not use illicit drugs.  All the positives are listed in BOLD  Review of Systems:  HEENT: Headache, blurred vision, runny nose, sore throat Neck: Hypothyroidism, hyperthyroidism,,lymphadenopathy Chest : Shortness of breath, history of COPD, Asthma Heart : Chest pain, history of coronary arterey disease GI:  Nausea, vomiting, diarrhea, constipation, GERD GU: Dysuria, urgency, frequency of urination, hematuria Neuro: Stroke, seizures, syncope Psych: Depression, anxiety, hallucinations   Physical Exam: Blood pressure 136/106, pulse 77, resp. rate 22, SpO2 96 %. Constitutional:   Patient is a well-developed and well-nourished female in no acute distress and cooperative with exam. Head: Normocephalic and atraumatic Mouth: Mucus membranes moist Eyes: PERRL, EOMI, conjunctivae normal Neck: Supple, No Thyromegaly Cardiovascular: RRR, S1 normal, S2 normal Pulmonary/Chest: Bilateral wheezing on auscultation Abdominal: Soft. Non-tender, non-distended, bowel sounds are normal, no masses, organomegaly, or guarding present.  Neurological: A&O x3, Strength is normal and symmetric bilaterally, cranial nerve II-XII are grossly intact, no focal motor deficit, sensory intact to light touch bilaterally.  Extremities : No Cyanosis, Clubbing or Edema  Labs on Admission:  Basic Metabolic Panel:  Recent Labs Lab 02/16/15 1354  NA 138  K 4.0  CL 102  CO2 24  GLUCOSE 146*  BUN 19  CREATININE 0.91  CALCIUM 8.8*   CBC:  Recent Labs Lab  02/16/15 1354  WBC 7.0  HGB 15.7*  HCT 46.7*  MCV 90.2  PLT 224   Cardiac Enzymes: No results for input(s): CKTOTAL, CKMB, CKMBINDEX, TROPONINI in the last 168 hours.  BNP (last 3 results)  Recent Labs  02/16/15 1545  BNP 89.0     Radiological Exams on Admission: Dg Chest 2 View  02/16/2015   CLINICAL DATA:  Short of breath.  Productive cough for 1 week.  EXAM: CHEST  2 VIEW  COMPARISON:  Multiple priors dating back to 2013.  FINDINGS: Cardiopericardial silhouette within normal limits. Monitoring leads project over the chest.  No focal consolidation or pleural effusion. There is diffuse interstitial prominence. This can be associated with interstitial pulmonary edema. In a patient with cough, the findings can also be associated with atypical infection such as mycoplasma pneumonia.  IMPRESSION: New diffuse interstitial prominence of the lungs, which may represent interstitial pulmonary edema or mycoplasma pneumonia in a patient with cough.   Electronically Signed   By: Dereck Ligas M.D.   On: 02/16/2015 15:32    EKG: Independently reviewed. Normal sinus rhythm  Assessment/Plan Active Problems:   COPD (chronic obstructive pulmonary disease)   COPD exacerbation   Mycoplasma pneumonia  COPD exacerbation We'll admit the patient with COPD exacerbation, start solid Medrol 60 mg IV every 6 hours, DuoNeb nebulizers every 6 hours, Mucinex 1 tablet by mouth twice a day. Zithromax 500 mg IV daily.  Mycoplasma pneumonia versus pulmonary edema Patient has mycoplasma pneumonia versus pulmonary edema as per chest x-ray will start Zithromax as above. Also give 2 doses of IV Lasix 20 g every 12 hours,  check echo in a.m. for possibility of diastolic dysfunction as patient does have uncontrolled hypertension   Hypertension  Blood pressure elevated, will start hydralazine 25 mg by mouth every 6 hours when necessary for BP greater than 160/100. Patient will benefit from anti-hypertensive  therapy if blood pressure continues to be elevated as outpatient.   DVT prophylaxis Lovenox  Code status:Full code   Family discussion: no family at bedside   Time Spent on Admission: 60 min  [facility] Pager: [phone] 02/16/2015, 8:52 PM  If 7PM-7AM, please contact night-coverage  [email or web address]  Password [email or web address]

## 2015-02-16 NOTE — ED Notes (Signed)
PT c/o increased SOB with chest tightness x3 days. PT stated was started on Levaquin and Prednisone for COPD exacerbation at her PCP with no relief.

## 2015-02-16 NOTE — ED Notes (Signed)
Patient would like something for back pain.

## 2015-02-16 NOTE — ED Provider Notes (Signed)
CSN: [id number]     Arrival date & time 02/16/15  1318 History   First MD Initiated Contact with Patient 02/16/15 1413     Chief Complaint  Patient presents with  . Shortness of Breath     (Consider location/radiation/quality/duration/timing/severity/associated sxs/prior Treatment) Patient is a 54 y.o. female presenting with shortness of breath. The history is provided by the patient (Patient complains of shortness of breath and cough for over a week. She has history of COPD. She has been on Levaquin and prednisone for 3 days.).  Shortness of Breath Severity:  Moderate Onset quality:  Gradual Timing:  Constant Progression:  Worsening Chronicity:  Recurrent Context: activity   Associated symptoms: cough   Associated symptoms: no abdominal pain, no chest pain, no headaches and no rash     Past Medical History  Diagnosis Date  . Asthma   . Depression   . Frequent urination at night   . IBS (irritable bowel syndrome)   . GERD (gastroesophageal reflux disease)   . Hypercholesteremia   . COPD (chronic obstructive pulmonary disease)    Past Surgical History  Procedure Laterality Date  . Abdominal hysterectomy    . Appendectomy     History reviewed. No pertinent family history. Social History  Substance Use Topics  . Smoking status: Current Every Day Smoker -- 0.25 packs/day    Types: Cigarettes  . Smokeless tobacco: None  . Alcohol Use: Yes     Comment: sledom per husband   OB History    No data available     Review of Systems  Constitutional: Negative for appetite change and fatigue.  HENT: Negative for congestion, ear discharge and sinus pressure.   Eyes: Negative for discharge.  Respiratory: Positive for cough and shortness of breath.   Cardiovascular: Negative for chest pain.  Gastrointestinal: Negative for abdominal pain and diarrhea.  Genitourinary: Negative for frequency and hematuria.  Musculoskeletal: Negative for back pain.  Skin: Negative for rash.   Neurological: Negative for seizures and headaches.  Psychiatric/Behavioral: Negative for hallucinations.      Allergies  Methadone and Aspirin  Home Medications   Prior to Admission medications   Medication Sig Start Date End Date Taking? Authorizing Provider  albuterol (PROVENTIL HFA;VENTOLIN HFA) 108 (90 BASE) MCG/ACT inhaler Inhale 2 puffs into the lungs every 6 (six) hours as needed.   Yes Historical Provider, MD  ALPRAZolam Duanne Moron) 1 MG tablet Take 1 mg by mouth 3 (three) times daily.   Yes Historical Provider, MD  amphetamine-dextroamphetamine (ADDERALL XR) 30 MG 24 hr capsule Take 90 mg by mouth every morning.   Yes Historical Provider, MD  aspirin EC 81 MG tablet Take 81 mg by mouth daily.   Yes Historical Provider, MD  ciclopirox (PENLAC) 8 % solution Apply 1 application topically at bedtime. Apply over nail and surrounding skin. Apply daily over previous coat. After seven (7) days, may remove with alcohol and continue cycle.   Yes Historical Provider, MD  fluticasone-salmeterol (ADVAIR HFA) 230-21 MCG/ACT inhaler Inhale 2 puffs into the lungs 2 (two) times daily.   Yes Historical Provider, MD  halobetasol (ULTRAVATE) 0.05 % cream Apply 1 application topically 2 (two) times daily. eczema   Yes Historical Provider, MD  ipratropium-albuterol (DUONEB) 0.5-2.5 (3) MG/3ML SOLN Take 3 mLs by nebulization every 4 (four) hours as needed.   Yes Historical Provider, MD  levofloxacin (LEVAQUIN) 750 MG tablet Take 750 mg by mouth daily. [place] 02/14/15   Yes Historical Provider, MD  loratadine (CLARITIN) 10 MG tablet Take 10 mg by mouth daily.   Yes Historical Provider, MD  mirabegron ER (MYRBETRIQ) 50 MG TB24 tablet Take 50 mg by mouth daily.   Yes Historical Provider, MD  montelukast (SINGULAIR) 10 MG tablet Take 10 mg by mouth at bedtime.   Yes Historical Provider, MD  naphazoline (NAPHCON) 0.1 % ophthalmic solution Place 1 drop into both eyes 4 (four) times daily as needed  for irritation or allergies.    Yes Historical Provider, MD  omeprazole (PRILOSEC) 20 MG capsule Take 20 mg by mouth daily.   Yes Historical Provider, MD  oxyCODONE-acetaminophen (PERCOCET) 10-325 MG per tablet Take 1 tablet by mouth 3 (three) times daily.   Yes Historical Provider, MD  predniSONE (DELTASONE) 20 MG tablet Take 3 po QD x 2d starting tomorrow, then 2 po QD x 3d then 1 po QD x 3d Patient taking differently: Take 40 mg by mouth daily. 6 day course starting on 02/15/2015 11/21/12  Yes Rolland Porter, MD  rosuvastatin (CRESTOR) 20 MG tablet Take 20 mg by mouth daily.   Yes Historical Provider, MD  traZODone (DESYREL) 50 MG tablet Take 150 mg by mouth at bedtime.    Yes Historical Provider, MD  tolterodine (DETROL) 2 MG tablet Take 2 mg by mouth 2 (two) times daily.    Historical Provider, MD   BP 136/106 mmHg  Pulse 77  Resp 22  SpO2 96% Physical Exam  Constitutional: She is oriented to person, place, and time. She appears well-developed.  HENT:  Head: Normocephalic.  Eyes: Conjunctivae and EOM are normal. No scleral icterus.  Neck: Neck supple. No thyromegaly present.  Cardiovascular: Normal rate and regular rhythm.  Exam reveals no gallop and no friction rub.   No murmur heard. Pulmonary/Chest: No stridor. She has wheezes. She has no rales. She exhibits no tenderness.  Abdominal: She exhibits no distension. There is no tenderness. There is no rebound.  Musculoskeletal: Normal range of motion. She exhibits no edema.  Lymphadenopathy:    She has no cervical adenopathy.  Neurological: She is oriented to person, place, and time. She exhibits normal muscle tone. Coordination normal.  Skin: No rash noted. No erythema.  Psychiatric: She has a normal mood and affect. Her behavior is normal.    ED Course  Procedures (including critical care time) Labs Review Labs Reviewed  BASIC METABOLIC PANEL - Abnormal; Notable for the following:    Glucose, Bld 146 (*)    Calcium 8.8 (*)    All  other components within normal limits  CBC - Abnormal; Notable for the following:    RBC 5.18 (*)    Hemoglobin 15.7 (*)    HCT 46.7 (*)    All other components within normal limits  BRAIN NATRIURETIC PEPTIDE    Imaging Review Dg Chest 2 View  02/16/2015   CLINICAL DATA:  Short of breath.  Productive cough for 1 week.  EXAM: CHEST  2 VIEW  COMPARISON:  Multiple priors dating back to 2013.  FINDINGS: Cardiopericardial silhouette within normal limits. Monitoring leads project over the chest.  No focal consolidation or pleural effusion. There is diffuse interstitial prominence. This can be associated with interstitial pulmonary edema. In a patient with cough, the findings can also be associated with atypical infection such as mycoplasma pneumonia.  IMPRESSION: New diffuse interstitial prominence of the lungs, which may represent interstitial pulmonary edema or mycoplasma pneumonia in a patient with cough.   Electronically Signed   By: Cay Schillings  Lamke M.D.   On: 02/16/2015 15:32   I have personally reviewed and evaluated these images and lab results as part of my medical decision-making.   EKG Interpretation   Date/Time:  Monday February 16 2015 13:22:02 EDT Ventricular Rate:  87 PR Interval:  136 QRS Duration: 92 QT Interval:  402 QTC Calculation: 483 R Axis:   58 Text Interpretation:  Normal sinus rhythm with sinus arrhythmia Prolonged  QT Abnormal ECG No significant change was found Confirmed by [place] [phone]) on 02/16/2015 1:52:38 PM      MDM   Final diagnoses:  COPD with acute bronchitis    Copd,  admit    Milton Ferguson, MD 02/16/15 2036

## 2015-02-17 ENCOUNTER — Inpatient Hospital Stay (HOSPITAL_COMMUNITY): Payer: Medicaid Other

## 2015-02-17 DIAGNOSIS — I509 Heart failure, unspecified: Secondary | ICD-10-CM

## 2015-02-17 LAB — COMPREHENSIVE METABOLIC PANEL
ALBUMIN: 3.7 g/dL (ref 3.5–5.0)
ALT: 16 U/L (ref 14–54)
AST: 16 U/L (ref 15–41)
Alkaline Phosphatase: 65 U/L (ref 38–126)
Anion gap: 8 (ref 5–15)
BUN: 19 mg/dL (ref 6–20)
CHLORIDE: 99 mmol/L — AB (ref 101–111)
CO2: 29 mmol/L (ref 22–32)
CREATININE: 0.77 mg/dL (ref 0.44–1.00)
Calcium: 8.6 mg/dL — ABNORMAL LOW (ref 8.9–10.3)
GFR calc Af Amer: >60 mL/min (ref >60)
GFR calc non Af Amer: >60 mL/min (ref >60)
GLUCOSE: 158 mg/dL — AB (ref 65–99)
POTASSIUM: 3.7 mmol/L (ref 3.5–5.1)
SODIUM: 136 mmol/L (ref 135–145)
Total Bilirubin: 0.4 mg/dL (ref 0.3–1.2)
Total Protein: 6.5 g/dL (ref 6.5–8.1)

## 2015-02-17 LAB — CBC
HCT: 44.2 % (ref 36.0–46.0)
Hemoglobin: 14.5 g/dL (ref 12.0–15.0)
MCH: 29.4 pg (ref 26.0–34.0)
MCHC: 32.8 g/dL (ref 30.0–36.0)
MCV: 89.5 fL (ref 78.0–100.0)
PLATELETS: 215 10*3/uL (ref 150–400)
RBC: 4.94 MIL/uL (ref 3.87–5.11)
RDW: 13.8 % (ref 11.5–15.5)
WBC: 9.1 10*3/uL (ref 4.0–10.5)

## 2015-02-17 MED ORDER — LEVOFLOXACIN 500 MG PO TABS
500.0000 mg | ORAL_TABLET | Freq: Every day | ORAL | Status: DC
  Administered 2015-02-18 – 2015-02-19 (×3): 500 mg via ORAL
  Filled 2015-02-17 (×3): qty 1

## 2015-02-17 MED ORDER — ALUM & MAG HYDROXIDE-SIMETH 200-200-20 MG/5ML PO SUSP: 15.0000 mL | ORAL | Status: DC | PRN

## 2015-02-17 MED ORDER — CETYLPYRIDINIUM CHLORIDE 0.05 % MT LIQD
7.0000 mL | Freq: Two times a day (BID) | OROMUCOSAL | Status: DC
  Administered 2015-02-17 – 2015-02-20 (×7): 7 mL via OROMUCOSAL

## 2015-02-17 MED ORDER — ALPRAZOLAM 1 MG PO TABS
1.0000 mg | ORAL_TABLET | Freq: Three times a day (TID) | ORAL | Status: DC | PRN
  Administered 2015-02-17 – 2015-02-20 (×3): 1 mg via ORAL
  Filled 2015-02-17 (×3): qty 1

## 2015-02-17 MED ORDER — PANTOPRAZOLE SODIUM 40 MG PO TBEC
40.0000 mg | DELAYED_RELEASE_TABLET | Freq: Every day | ORAL | Status: DC
  Administered 2015-02-17 – 2015-02-20 (×4): 40 mg via ORAL
  Filled 2015-02-17 (×4): qty 1

## 2015-02-17 NOTE — Progress Notes (Signed)
*  PRELIMINARY RESULTS* Echocardiogram 2D Echocardiogram has been performed.  Leavy Cella 02/17/2015, 4:37 PM

## 2015-02-17 NOTE — Progress Notes (Addendum)
[facility] PROGRESS NOTE  Theresa Pace RFF:[id number] DOB: 06/21/1960 DOA: 02/16/2015 PCP: Gar Ponto, MD  Assessment/Plan: 1. Acute Hypoxic resp failure -multifactorial, COPD exacerbation with Pneumonia vs CHF -completed 2 courses of Abx and steroids in the last month for same -continue Solumedrol, nebs, change zithromax to levaquin -check sputum cx -check ECHO, continue IV lasix for 1 more day  2. COPD exacerbation - as above  3. HTN -BP improved now, continue PRN hydralazine  4. Anxiety -xanax PRN  DVt proph: lovenox  Code Status: FUll code Family Communication: none at bedside Disposition Plan: home when improved    HPI/Subjective: Still with cough, dyspnea, -slight improvement  Objective: Filed Vitals:   02/17/15 0607  BP: 137/80  Pulse: 79  Temp: 98.4 F (36.9 C)  Resp: 20    Intake/Output Summary (Last 24 hours) at 02/17/15 1021 Last data filed at 02/17/15 0942  Gross per 24 hour  Intake    240 ml  Output    600 ml  Net   -360 ml   Filed Weights   02/16/15 2157  Weight: 78.019 kg (172 lb)    Exam:   General:  AAOx3  Cardiovascular: S1S2/RRR  Respiratory: poor air movt, scatttered ronchi, occasional wheezes  Abdomen: soft, NT, BS present  Musculoskeletal: trace edema   Data Reviewed: Basic Metabolic Panel:  Recent Labs Lab 02/16/15 1354 02/17/15 0613  NA 138 136  K 4.0 3.7  CL 102 99*  CO2 24 29  GLUCOSE 146* 158*  BUN 19 19  CREATININE 0.91 0.77  CALCIUM 8.8* 8.6*   Liver Function Tests:  Recent Labs Lab 02/17/15 0613  AST 16  ALT 16  ALKPHOS 65  BILITOT 0.4  PROT 6.5  ALBUMIN 3.7   No results for input(s): LIPASE, AMYLASE in the last 168 hours. No results for input(s): AMMONIA in the last 168 hours. CBC:  Recent Labs Lab 02/16/15 1354 02/17/15 0613  WBC 7.0 9.1  HGB 15.7* 14.5  HCT 46.7* 44.2  MCV 90.2 89.5  PLT 224 215   Cardiac Enzymes: No results for input(s): CKTOTAL, CKMB, CKMBINDEX,  TROPONINI in the last 168 hours. BNP (last 3 results)  Recent Labs  02/16/15 1545  BNP 89.0    ProBNP (last 3 results) No results for input(s): PROBNP in the last 8760 hours.  CBG: No results for input(s): GLUCAP in the last 168 hours.  No results found for this or any previous visit (from the past 240 hour(s)).   Studies: Dg Chest 2 View  02/16/2015   CLINICAL DATA:  Short of breath.  Productive cough for 1 week.  EXAM: CHEST  2 VIEW  COMPARISON:  Multiple priors dating back to 2013.  FINDINGS: Cardiopericardial silhouette within normal limits. Monitoring leads project over the chest.  No focal consolidation or pleural effusion. There is diffuse interstitial prominence. This can be associated with interstitial pulmonary edema. In a patient with cough, the findings can also be associated with atypical infection such as mycoplasma pneumonia.  IMPRESSION: New diffuse interstitial prominence of the lungs, which may represent interstitial pulmonary edema or mycoplasma pneumonia in a patient with cough.   Electronically Signed   By: Dereck Ligas M.D.   On: 02/16/2015 15:32    Scheduled Meds: . ALPRAZolam  1 mg Oral TID  . amphetamine-dextroamphetamine  90 mg Oral BH-q7a  . antiseptic oral rinse  7 mL Mouth Rinse BID  . aspirin EC  81 mg Oral Daily  . azithromycin  500 mg  Intravenous Q24H  . enoxaparin (LOVENOX) injection  40 mg Subcutaneous Q24H  . furosemide  20 mg Intravenous Q12H  . guaiFENesin  600 mg Oral BID  . ipratropium-albuterol  3 mL Nebulization Q6H  . loratadine  10 mg Oral Daily  . methylPREDNISolone (SOLU-MEDROL) injection  60 mg Intravenous Q6H  . mirabegron ER  50 mg Oral Daily  . rosuvastatin  20 mg Oral q1800  . sodium chloride  3 mL Intravenous Q12H  . traZODone  150 mg Oral QHS   Continuous Infusions:  Antibiotics Given (last 72 hours)    Date/Time Action Medication Dose Rate   02/16/15 2335 Given   azithromycin (ZITHROMAX) 500 mg in dextrose 5 % 250 mL  IVPB 500 mg 250 mL/hr      Active Problems:   COPD (chronic obstructive pulmonary disease)   COPD exacerbation   Mycoplasma pneumonia    Time spent: 47mn    JOSEPH,PREETHA  [facility] Pager [phone] If 7PM-7AM, please contact night-coverage at [email or web address], password [email or web address]9/27/2016, 10:21 AM  LOS: 1 day

## 2015-02-17 NOTE — Plan of Care (Signed)
Problem: ICU Phase Progression Outcomes Goal: Flu/PneumoVaccines if indicated Outcome: Completed/Met Date Met:  02/17/15 Patient will receive flu vaccine prior to discharge

## 2015-02-17 NOTE — Care Management Note (Signed)
Case Management Note  Patient Details  Name: Theresa Pace MRN: [id number] Date of Birth: 1960/10/11  Subjective/Objective:                  Pt admitted from home with COPD exacerbation. Pt lives with her husband and will return home at discharge. Pt is fairly independent with ADL's.  Action/Plan: Will need home O2 assessment prior to discharge. Will continue to follow.  Expected Discharge Date:                  Expected Discharge Plan:  Home/Self Care  In-House Referral:  NA  Discharge planning Services  CM Consult  Post Acute Care Choice:  NA Choice offered to:  NA  DME Arranged:    DME Agency:     HH Arranged:    HH Agency:     Status of Service:  In process, will continue to follow  Medicare Important Message Given:    Date Medicare IM Given:    Medicare IM give by:    Date Additional Medicare IM Given:    Additional Medicare Important Message give by:     If discussed at [place] of Stay Meetings, dates discussed:    Additional Comments:  Joylene Draft, RN 02/17/2015, 1:34 PM

## 2015-02-18 DIAGNOSIS — J449 Chronic obstructive pulmonary disease, unspecified: Secondary | ICD-10-CM

## 2015-02-18 DIAGNOSIS — J157 Pneumonia due to Mycoplasma pneumoniae: Principal | ICD-10-CM

## 2015-02-18 DIAGNOSIS — J9601 Acute respiratory failure with hypoxia: Secondary | ICD-10-CM

## 2015-02-18 DIAGNOSIS — J441 Chronic obstructive pulmonary disease with (acute) exacerbation: Secondary | ICD-10-CM

## 2015-02-18 LAB — CBC
HEMATOCRIT: 48.4 % — AB (ref 36.0–46.0)
Hemoglobin: 16.4 g/dL — ABNORMAL HIGH (ref 12.0–15.0)
MCH: 29.9 pg (ref 26.0–34.0)
MCHC: 33.9 g/dL (ref 30.0–36.0)
MCV: 88.2 fL (ref 78.0–100.0)
Platelets: 252 10*3/uL (ref 150–400)
RBC: 5.49 MIL/uL — AB (ref 3.87–5.11)
RDW: 13.6 % (ref 11.5–15.5)
WBC: 9.3 10*3/uL (ref 4.0–10.5)

## 2015-02-18 LAB — BASIC METABOLIC PANEL
ANION GAP: 14 (ref 5–15)
BUN: 26 mg/dL — ABNORMAL HIGH (ref 6–20)
CALCIUM: 8.9 mg/dL (ref 8.9–10.3)
CO2: 25 mmol/L (ref 22–32)
Chloride: 96 mmol/L — ABNORMAL LOW (ref 101–111)
Creatinine, Ser: 0.95 mg/dL (ref 0.44–1.00)
Glucose, Bld: 207 mg/dL — ABNORMAL HIGH (ref 65–99)
POTASSIUM: 3.8 mmol/L (ref 3.5–5.1)
Sodium: 135 mmol/L (ref 135–145)

## 2015-02-18 NOTE — Discharge Planning (Signed)
Pt SPO2 was 96 RA at rest. Pt was ambulated 1018f on RA and remained above 90%. Pt will not need O2 for home at Discharge.

## 2015-02-18 NOTE — Progress Notes (Signed)
PROGRESS NOTE  Theresa Pace [id number]:[id number] DOB: 07/22/1960 DOA: 02/16/2015 PCP: Gar Ponto, MD  Summary: 54 yo female with a history of COPD, asthma who presented to the ED with complaints of SOB for the past week. She was recently treated for PNA with Avelox and just started on Prednisone and Levaquin on 9/24. While in the ED patient was found to be hypertensive. Labs revealed BNP 89 and WBC WNL. CXR revealed new diffuse interstitial prominence of the lungs. Patient was admitted for COPD exacerbation, pneumonia.  Assessment/Plan: 1. Acute hypoxic respiratory failure secondary to COPD exacerbation and pneumonia Resolved.  2. COPD exacerbation. Slow to improve.1 CXR revealed new diffuse interstitial prominence of the lungs--pneumonia vs edema. 3. Suspected atyipcal pneumonia. WBC wnl, BNP 89. Normal LVEF on echo 4. HTN. Currently hypertensive. 5. HLD. Continue statin 6. GERD. Continue Protonix 7. Depression/anxiety. Continue Xanax 8. Seizures.   Overall improving  Continue abx, steroids, and nebs.  Home likely next 48 hours.  Code Status: Full DVT prophylaxis: Lovenox Family discussion: Dicussed plan in detail. No further concerns at this time. Disposition Plan: Anticipate discharge within 1-2 days  Murray Hodgkins, MD  [facility]  Pager [phone] If 7PM-7AM, please contact night-coverage at [email or web address], password [facility] 02/18/2015, 7:48 AM  LOS: 2 days   Consultants:    Procedures:  ECHO - Left ventricle: The cavity size was normal. Wall thickness was increased in a pattern of mild LVH. Systolic function was normal. The estimated ejection fraction was in the range of 50% to 55%. Wall motion was normal; there were no regional wall motion abnormalities. Doppler parameters are consistent with abnormal left ventricular relaxation (grade 1 diastolic dysfunction). - Aortic valve: Valve area (VTI): 1.94 cm^2. Valve area (Vmax): 2.38 cm^2. - Technically  adequate study.  Antibiotics:  Zithromax 9/26>>  Levaquin 9/27>>  HPI/Subjective: Reports wheezing and cough. Difficulty ambulating with DOE but a little better.    Objective: Filed Vitals:   02/17/15 1927 02/17/15 2201 02/18/15 0504 02/18/15 0718  BP:  152/101 144/95   Pulse:  116 96   Temp:  98.4 F (36.9 C) 98.2 F (36.8 C)   TempSrc:  Oral    Resp:  20 20   Height:      Weight:      SpO2: 95% 92% 92% 89%    Intake/Output Summary (Last 24 hours) at 02/18/15 0748 Last data filed at 02/18/15 0505  Gross per 24 hour  Intake    720 ml  Output   3300 ml  Net  -2580 ml     Filed Weights   02/16/15 2157  Weight: 78.019 kg (172 lb)    Exam:  Afebrile, not hypoxic General:  Appears calm and comfortable Eyes: PERRL, normal lids, irises & conjunctiva ENT: grossly normal hearing, lips & tongue Cardiovascular: Regular rhythm. Tachycardic, no m/r/g. No LE edema. Respiratory: Bilateral wheezing No /r/r. Increased respiratory effort. Abdomen: soft, ntnd Psychiatric: grossly normal mood and affect, speech fluent and appropriate Neurologic: grossly non-focal.  New data reviewed:  BMP/CBC unremarkable  Pertinent data since admission:  CXR: New diffuse interstitial prominence of the lungs, which may represent interstitial pulmonary edema or mycoplasma pneumonia in a patient with cough.  BNP 89  EKG SR  Pending data:    Scheduled Meds: . amphetamine-dextroamphetamine  90 mg Oral BH-q7a  . antiseptic oral rinse  7 mL Mouth Rinse BID  . aspirin EC  81 mg Oral Daily  . enoxaparin (LOVENOX) injection  40 mg Subcutaneous Q24H  .  guaiFENesin  600 mg Oral BID  . ipratropium-albuterol  3 mL Nebulization Q6H  . levofloxacin  500 mg Oral QHS  . loratadine  10 mg Oral Daily  . methylPREDNISolone (SOLU-MEDROL) injection  60 mg Intravenous Q6H  . mirabegron ER  50 mg Oral Daily  . pantoprazole  40 mg Oral Daily  . rosuvastatin  20 mg Oral q1800  . sodium chloride  3  mL Intravenous Q12H  . traZODone  150 mg Oral QHS   Continuous Infusions:   Principal Problem:   Acute respiratory failure with hypoxia Active Problems:   COPD (chronic obstructive pulmonary disease)   COPD exacerbation   Mycoplasma pneumonia   Time spent 25 minutes   By signing my name below, I, Rhett Bannister attest that this documentation has been prepared under the direction and in the presence of Murray Hodgkins, M.D.   Electronically signed: Rhett Bannister  02/18/2015  2:03 PM  I personally performed the services described in this documentation. All medical record entries made by the scribe were at my direction. I have reviewed the chart and agree that the record reflects my personal performance and is accurate and complete. Murray Hodgkins, MD

## 2015-02-19 MED ORDER — METHYLPREDNISOLONE SODIUM SUCC 125 MG IJ SOLR
60.0000 mg | Freq: Two times a day (BID) | INTRAMUSCULAR | Status: DC
  Administered 2015-02-20: 60 mg via INTRAVENOUS
  Filled 2015-02-19 (×2): qty 2

## 2015-02-19 MED ORDER — IPRATROPIUM-ALBUTEROL 0.5-2.5 (3) MG/3ML IN SOLN
3.0000 mL | Freq: Three times a day (TID) | RESPIRATORY_TRACT | Status: DC
  Administered 2015-02-19 – 2015-02-20 (×4): 3 mL via RESPIRATORY_TRACT
  Filled 2015-02-19 (×4): qty 3

## 2015-02-19 NOTE — Progress Notes (Signed)
PROGRESS NOTE  Theresa Pace WFU:[id number] DOB: 08-18-1960 DOA: 02/16/2015 PCP: Gar Ponto, MD  Summary: 54 yo female with a history of COPD, asthma who presented to the ED with complaints of SOB for the past week. She was recently treated for PNA with Avelox and just started on Prednisone and Levaquin on 9/24. While in the ED patient was found to be hypertensive. Labs revealed BNP 89 and WBC WNL. CXR revealed new diffuse interstitial prominence of the lungs. Patient was admitted for COPD exacerbation, pneumonia.  Assessment/Plan: 1. Acute hypoxic respiratory failure secondary to COPD exacerbation and pneumonia, resolved.  2. COPD exacerbation, slowly improving. CXR revealed new diffuse interstitial prominence of the lungs--pneumonia favored. 3. Suspected atypical pneumonia. WBC wnl, afebrile, BNP 89. Normal LVEF on echo 4. HTN. Remains hypertensive. Continue antihypertensives.  5. HLD. Continue statin 6. GERD. Continue Protonix 7. Depression/anxiety. Continue Xanax 8. Seizures.   Overall slowly improving with steroids and nebs.   Anticipate discharge 9/30.  Code Status: Full DVT prophylaxis: Lovenox Family discussion: Discussed with patient and husband who understands and has no concerns at this time. Disposition Plan: Discharge home within 24 hours.  Murray Hodgkins, MD  [facility]  Pager [phone] If 7PM-7AM, please contact night-coverage at [email or web address], password [email or web address] 02/19/2015, 7:12 AM  LOS: 3 days   Consultants:    Procedures:  Echo - Left ventricle: The cavity size was normal. Wall thickness was increased in a pattern of mild LVH. Systolic function was normal. The estimated ejection fraction was in the range of 50% to 55%. Wall motion was normal; there were no regional wall motion abnormalities. Doppler parameters are consistent with abnormal left ventricular relaxation (grade 1 diastolic dysfunction). - Aortic valve: Valve area (VTI): 1.94  cm^2. Valve area (Vmax): 2.38 cm^2. - Technically adequate study.  Antibiotics:  Zithromax 9/26>>  Levaquin 9/27>>  HPI/Subjective: Feeling about the same. Reports mild cough persistent that is worse in the morning and while moving around. No nausea. Still SOB.  Objective: Filed Vitals:   02/18/15 1520 02/18/15 1921 02/18/15 2029 02/19/15 0500  BP: 161/99  164/107 165/104  Pulse: 116  122 87  Temp: 98 F (36.7 C)  98.4 F (36.9 C) 97.8 F (36.6 C)  TempSrc:   Oral Oral  Resp: '18  20 18  '$ Height:      Weight:      SpO2: 93% 94% 92% 92%    Intake/Output Summary (Last 24 hours) at 02/19/15 0623 Last data filed at 02/18/15 2300  Gross per 24 hour  Intake    480 ml  Output    400 ml  Net     80 ml     Filed Weights   02/16/15 2157  Weight: 78.019 kg (172 lb)    Exam: Afebrile, VSS, not hypoxic General:  Appears calm and comfortable Cardiovascular: RRR, no m/r/g. No LE edema. Respiratory: No rales. Some expiratory wheezes. Few rhonchi. Musculoskeletal: grossly normal tone BUE/BLE Psychiatric: grossly normal mood and affect, speech fluent and appropriate   New data reviewed:    Pertinent data since admission:  CXR: New diffuse interstitial prominence of the lungs, which may represent interstitial pulmonary edema or mycoplasma pneumonia in a patient with cough.  BNP 89  EKG SR  ECHO - Left ventricle: The cavity size was normal. Wall thickness was increased in a pattern of mild LVH. Systolic function was normal. The estimated ejection fraction was in the range of 50% to 55%. Wall motion was normal; there were no regional  wall motion abnormalities. Doppler parameters are consistent with abnormal left ventricular relaxation (grade 1 diastolic dysfunction). - Aortic valve: Valve area (VTI): 1.94 cm^2. Valve area (Vmax): 2.38 cm^2. - Technically adequate study.  Pending data:    Scheduled Meds: . amphetamine-dextroamphetamine  90 mg Oral  BH-q7a  . antiseptic oral rinse  7 mL Mouth Rinse BID  . aspirin EC  81 mg Oral Daily  . enoxaparin (LOVENOX) injection  40 mg Subcutaneous Q24H  . guaiFENesin  600 mg Oral BID  . ipratropium-albuterol  3 mL Nebulization TID  . levofloxacin  500 mg Oral QHS  . loratadine  10 mg Oral Daily  . methylPREDNISolone (SOLU-MEDROL) injection  60 mg Intravenous Q6H  . mirabegron ER  50 mg Oral Daily  . pantoprazole  40 mg Oral Daily  . rosuvastatin  20 mg Oral q1800  . sodium chloride  3 mL Intravenous Q12H  . traZODone  150 mg Oral QHS   Continuous Infusions:   Principal Problem:   Acute respiratory failure with hypoxia Active Problems:   COPD (chronic obstructive pulmonary disease)   COPD exacerbation   Mycoplasma pneumonia   Time spent 20 minutes   By signing my name below, I, Rosalie Doctor attest that this documentation has been prepared under the direction and in the presence of Murray Hodgkins, MD Electronically signed: Rosalie Doctor, Scribe.  02/19/2015 10:30 AM  I personally performed the services described in this documentation. All medical record entries made by the scribe were at my direction. I have reviewed the chart and agree that the record reflects my personal performance and is accurate and complete. Murray Hodgkins, MD

## 2015-02-20 LAB — HIV ANTIBODY (ROUTINE TESTING W REFLEX): HIV SCREEN 4TH GENERATION: NONREACTIVE

## 2015-02-20 MED ORDER — PREDNISONE 10 MG PO TABS: ORAL_TABLET | ORAL | Status: DC

## 2015-02-20 MED ORDER — LEVOFLOXACIN 750 MG PO TABS: 750.0000 mg | ORAL_TABLET | Freq: Every day | ORAL | Status: DC

## 2015-02-20 MED ORDER — IPRATROPIUM-ALBUTEROL 0.5-2.5 (3) MG/3ML IN SOLN: 3.0000 mL | Freq: Four times a day (QID) | RESPIRATORY_TRACT | Status: DC

## 2015-02-20 NOTE — Progress Notes (Signed)
PROGRESS NOTE  Theresa Pace RFF:[id number] DOB: 01-14-1961 DOA: 02/16/2015 PCP: Gar Ponto, MD  Summary: 54 yo female with a history of COPD, asthma who presented to the ED with complaints of SOB for the past week. She was recently treated for PNA with Avelox and just started on Prednisone and Levaquin on 9/24. While in the ED patient was found to be hypertensive. Labs revealed BNP 89 and WBC WNL. CXR revealed new diffuse interstitial prominence of the lungs. Patient was admitted for COPD exacerbation, pneumonia.  Assessment/Plan: 1. Acute hypoxic respiratory failure secondary to COPD exacerbation and pneumonia, resolved.  2. COPD exacerbation, resolved. 3. Atypical pneumonia, improved. WBC wnl, afebrile, BNP 89. Normal LVEF on echo 4. HTN. Remains stable  5. HLD. Continue statin 6. GERD. Continue Protonix 7. Depression/anxiety. Continue Xanax 8. Seizures.   Overall improved with steroids and nebs. Plan to finish abx.   Discharge home today.  Code Status: Full DVT prophylaxis: Lovenox Family discussion: Discussed with patient who understands and has no concerns at this time. Disposition Plan: Discharge home today.  Murray Hodgkins, MD  [facility]  Pager [phone] If 7PM-7AM, please contact night-coverage at [email or web address], password [facility] 02/20/2015, 8:01 AM  LOS: 4 days   Consultants:    Procedures:  ECHO - Left ventricle: The cavity size was normal. Wall thickness was increased in a pattern of mild LVH. Systolic function was normal. The estimated ejection fraction was in the range of 50% to 55%. Wall motion was normal; there were no regional wall motion abnormalities. Doppler parameters are consistent with abnormal left ventricular relaxation (grade 1 diastolic dysfunction). - Aortic valve: Valve area (VTI): 1.94 cm^2. Valve area (Vmax): 2.38 cm^2. - Technically adequate study.  Antibiotics:  Zithromax 9/26>>9/26  Levaquin  9/27>>10/3  HPI/Subjective: Overall seems to be breathing better. Still feels weak and short of breath at times.  Objective: Filed Vitals:   02/19/15 1953 02/19/15 2100 02/20/15 0515 02/20/15 0742  BP:  141/84 127/74   Pulse:  94 66   Temp:  98.6 F (37 C) 97.7 F (36.5 C)   TempSrc:  Oral Oral   Resp:  20 19   Height:      Weight:      SpO2: 97% 96% 93% 93%    Intake/Output Summary (Last 24 hours) at 02/20/15 0801 Last data filed at 02/19/15 1756  Gross per 24 hour  Intake    480 ml  Output      0 ml  Net    480 ml     Filed Weights   02/16/15 2157  Weight: 78.019 kg (172 lb)    Exam: Afebrile, VSS, not hypoxic General:  Appears calm and comfortable Cardiovascular: RRR, no m/r/g. No LE edema. Respiratory: Clear to auscultation bilaterally. No wheezes, rales or rhonchi. Normal respiratory effort. Psychiatric: grossly normal mood and affect, speech fluent and appropriate   New data reviewed:  HIV nonreactive  Pertinent data since admission:  CXR: New diffuse interstitial prominence of the lungs, which may represent interstitial pulmonary edema or mycoplasma pneumonia in a patient with cough.  BNP 89  EKG SR  ECHO - Left ventricle: The cavity size was normal. Wall thickness was increased in a pattern of mild LVH. Systolic function was normal. The estimated ejection fraction was in the range of 50% to 55%. Wall motion was normal; there were no regional wall motion abnormalities. Doppler parameters are consistent with abnormal left ventricular relaxation (grade 1 diastolic dysfunction). - Aortic valve: Valve area (VTI):  1.94 cm^2. Valve area (Vmax): 2.38 cm^2. - Technically adequate study.  Pending data:    Scheduled Meds: . amphetamine-dextroamphetamine  90 mg Oral BH-q7a  . antiseptic oral rinse  7 mL Mouth Rinse BID  . aspirin EC  81 mg Oral Daily  . enoxaparin (LOVENOX) injection  40 mg Subcutaneous Q24H  . guaiFENesin  600 mg Oral  BID  . ipratropium-albuterol  3 mL Nebulization TID  . levofloxacin  500 mg Oral QHS  . loratadine  10 mg Oral Daily  . methylPREDNISolone (SOLU-MEDROL) injection  60 mg Intravenous Q12H  . mirabegron ER  50 mg Oral Daily  . pantoprazole  40 mg Oral Daily  . rosuvastatin  20 mg Oral q1800  . sodium chloride  3 mL Intravenous Q12H  . traZODone  150 mg Oral QHS   Continuous Infusions:   Principal Problem:   Acute respiratory failure with hypoxia Active Problems:   COPD (chronic obstructive pulmonary disease)   COPD exacerbation   Mycoplasma pneumonia   By signing my name below, I, Rhett Bannister attest that this documentation has been prepared under the direction and in the presence of Murray Hodgkins, MD   Electronically signed: Rhett Bannister  02/20/2015 11:45 AM  I personally performed the services described in this documentation. All medical record entries made by the scribe were at my direction. I have reviewed the chart and agree that the record reflects my personal performance and is accurate and complete. Murray Hodgkins, MD

## 2015-02-20 NOTE — Progress Notes (Signed)
Pt IV removed, pt tolerated well. Reviewed discharge instructions and answered questions at this time.

## 2015-02-20 NOTE — Discharge Summary (Signed)
Physician Discharge Summary  Theresa Pace VZD:[id number] DOB: June 04, 1960 DOA: 02/16/2015  PCP: Gar Ponto, MD  Admit date: 02/16/2015 Discharge date: 02/20/2015  Recommendations for Outpatient Follow-up:  1. Follow-up resolution of pneumonia and COPD exacerbation 2. Could consider outpatient pulmonary rehabilitation 3. Patient requests referral to pulmonologist in [place], defer to PCP 4. Repeat CXR in 4 weeks to ensure resolution of pneumonia.   Follow-up Information    Follow up with Gar Ponto, MD. Schedule an appointment as soon as possible for a visit on 02/27/2015.   Specialty:  Family Medicine   Contact information:   [place] [address] [phone]       Discharge Diagnoses:  1. Acute hypoxic respiratory failure secondary to COPD exacerbation and pneumonia. 2. COPD exacerbation. 3. Atypical pneumonia.  4. HTN.   5. HLD.  6. GERD.  7. Depression/anxiety.  8. Seizures.  Discharge Condition: Improved  Disposition: Home  Diet recommendation: Heart Healthy  Filed Weights   02/16/15 2157  Weight: 78.019 kg (172 lb)    History of present illness:  54 yo female with a history of COPD, asthma who presented to the ED with complaints of SOB for the past week. She was recently treated for PNA with Avelox and just started on Prednisone and Levaquin on 9/24. While in the ED patient was found to be hypertensive. Labs revealed BNP 89 and WBC WNL. CXR revealed new diffuse interstitial prominence of the lungs. Patient was admitted for COPD exacerbation, pneumonia.  Hospital Course:  Acute hypoxic respiratory failure resolved with treatment aimed at pneumonia and COPD exacerbation. COPD exacerbation and pneumonia have improved with treatment as below. Overall appears stable for discharge, did not qualify for home oxygen. Recommended smoking cessation to her. She plans to ask for referral to a pulmonologist in [place]. I suggested considering pulmonary  rehabilitation.  Individual issues as below:  1. Acute hypoxic respiratory failure secondary to COPD exacerbation and pneumonia, resolved.  2. COPD exacerbation, resolved. 3. Atypical pneumonia, improved. WBC wnl, afebrile, BNP 89. Normal LVEF on echo 4. HTN. Remains stable  5. HLD. Continue statin 6. GERD. Continue Protonix 7. Depression/anxiety. Continue Xanax 8. Seizures.  Consultants:  None   Procedures:  ECHO - Left ventricle: The cavity size was normal. Wall thickness was increased in a pattern of mild LVH. Systolic function was normal. The estimated ejection fraction was in the range of 50% to 55%. Wall motion was normal; there were no regional wall motion abnormalities. Doppler parameters are consistent with abnormal left ventricular relaxation (grade 1 diastolic dysfunction). - Aortic valve: Valve area (VTI): 1.94 cm^2. Valve area (Vmax): 2.38 cm^2. - Technically adequate study.  Antibiotics: 9. Zithromax 9/26>>9/26 10. Levaquin 9/27>>10/3  Discharge Instructions Discharge Instructions    Diet - low sodium heart healthy    Complete by:  As directed      Discharge instructions    Complete by:  As directed   Call your physician or seek immediate medical attention for shortness of breath, fever, fatigue, lethargy, increased cough or worsening of condition.     Increase activity slowly    Complete by:  As directed             Discharge Medication List as of 02/20/2015 12:48 PM    CONTINUE these medications which have CHANGED   Details  ipratropium-albuterol (DUONEB) 0.5-2.5 (3) MG/3ML SOLN Take 3 mLs by nebulization 4 (four) times daily., Starting 02/20/2015, Until Discontinued, No Print    levofloxacin (LEVAQUIN) 750  MG tablet Take 1 tablet (750 mg total) by mouth daily., Starting 02/20/2015, Until Discontinued, Normal    predniSONE (DELTASONE) 10 MG tablet Take 40 mg by mouth daily for 3 days, then take 20 mg by mouth daily for 3 days, then  take 10 mg by mouth daily for 3 days, then stop., Normal      CONTINUE these medications which have NOT CHANGED   Details  albuterol (PROVENTIL HFA;VENTOLIN HFA) 108 (90 BASE) MCG/ACT inhaler Inhale 2 puffs into the lungs every 6 (six) hours as needed., Until Discontinued, Historical Med    ALPRAZolam (XANAX) 1 MG tablet Take 1 mg by mouth 3 (three) times daily., Until Discontinued, Historical Med    amphetamine-dextroamphetamine (ADDERALL XR) 30 MG 24 hr capsule Take 90 mg by mouth every morning., Until Discontinued, Historical Med    aspirin EC 81 MG tablet Take 81 mg by mouth daily., Until Discontinued, Historical Med    ciclopirox (PENLAC) 8 % solution Apply 1 application topically at bedtime. Apply over nail and surrounding skin. Apply daily over previous coat. After seven (7) days, may remove with alcohol and continue cycle., Until Discontinued, Historical Med    fluticasone-salmeterol (ADVAIR HFA) 230-21 MCG/ACT inhaler Inhale 2 puffs into the lungs 2 (two) times daily., Until Discontinued, Historical Med    halobetasol (ULTRAVATE) 0.05 % cream Apply 1 application topically 2 (two) times daily. eczema, Until Discontinued, Historical Med    loratadine (CLARITIN) 10 MG tablet Take 10 mg by mouth daily., Until Discontinued, Historical Med    mirabegron ER (MYRBETRIQ) 50 MG TB24 tablet Take 50 mg by mouth daily., Until Discontinued, Historical Med    montelukast (SINGULAIR) 10 MG tablet Take 10 mg by mouth at bedtime., Until Discontinued, Historical Med    naphazoline (NAPHCON) 0.1 % ophthalmic solution Place 1 drop into both eyes 4 (four) times daily as needed for irritation or allergies. , Until Discontinued, Historical Med    omeprazole (PRILOSEC) 20 MG capsule Take 20 mg by mouth daily., Until Discontinued, Historical Med    oxyCODONE-acetaminophen (PERCOCET) 10-325 MG per tablet Take 1 tablet by mouth 3 (three) times daily., Until Discontinued, Historical Med    rosuvastatin  (CRESTOR) 20 MG tablet Take 20 mg by mouth daily., Until Discontinued, Historical Med    traZODone (DESYREL) 50 MG tablet Take 150 mg by mouth at bedtime. , Until Discontinued, Historical Med    tolterodine (DETROL) 2 MG tablet Take 2 mg by mouth 2 (two) times daily., Until Discontinued, Historical Med       Allergies  Allergen Reactions  . Methadone Hives  . Aspirin Other (See Comments)    Stomach Pain    The results of significant diagnostics from this hospitalization (including imaging, microbiology, ancillary and laboratory) are listed below for reference.    Significant Diagnostic Studies: Dg Chest 2 View  02/16/2015   CLINICAL DATA:  Short of breath.  Productive cough for 1 week.  EXAM: CHEST  2 VIEW  COMPARISON:  Multiple priors dating back to 2013.  FINDINGS: Cardiopericardial silhouette within normal limits. Monitoring leads project over the chest.  No focal consolidation or pleural effusion. There is diffuse interstitial prominence. This can be associated with interstitial pulmonary edema. In a patient with cough, the findings can also be associated with atypical infection such as mycoplasma pneumonia.  IMPRESSION: New diffuse interstitial prominence of the lungs, which may represent interstitial pulmonary edema or mycoplasma pneumonia in a patient with cough.   Electronically Signed   By: Cay Schillings  Lamke M.D.   On: 02/16/2015 15:32     Labs: Basic Metabolic Panel:  Recent Labs Lab 02/16/15 1354 02/17/15 0613 02/18/15 0621  NA 138 136 135  K 4.0 3.7 3.8  CL 102 99* 96*  CO2 '24 29 25  '$ GLUCOSE 146* 158* 207*  BUN 19 19 26*  CREATININE 0.91 0.77 0.95  CALCIUM 8.8* 8.6* 8.9   Liver Function Tests:  Recent Labs Lab 02/17/15 0613  AST 16  ALT 16  ALKPHOS 65  BILITOT 0.4  PROT 6.5  ALBUMIN 3.7    CBC:  Recent Labs Lab 02/16/15 1354 02/17/15 0613 02/18/15 0621  WBC 7.0 9.1 9.3  HGB 15.7* 14.5 16.4*  HCT 46.7* 44.2 48.4*  MCV 90.2 89.5 88.2  PLT 224 215  252     Recent Labs  02/16/15 1545  BNP 89.0      Principal Problem:   Acute respiratory failure with hypoxia Active Problems:   COPD (chronic obstructive pulmonary disease)   COPD exacerbation   Mycoplasma pneumonia   Time coordinating discharge: 35 minutes  Signed:  Murray Hodgkins, MD [facility] 02/20/2015, 8:02 AM  By signing my name below, I, Rhett Bannister attest that this documentation has been prepared under the direction and in the presence of Murray Hodgkins, MD   Electronically signed: Rhett Bannister  02/20/2015 11:45 AM  I personally performed the services described in this documentation. All medical record entries made by the scribe were at my direction. I have reviewed the chart and agree that the record reflects my personal performance and is accurate and complete. Murray Hodgkins, MD

## 2015-02-20 NOTE — Care Management Note (Signed)
Case Management Note  Patient Details  Name: ZYRA PARRILLO MRN: [id number] Date of Birth: 1960-08-30  Subjective/Objective:                    Action/Plan:   Expected Discharge Date:                  Expected Discharge Plan:  Home/Self Care  In-House Referral:  NA  Discharge planning Services  CM Consult  Post Acute Care Choice:  NA Choice offered to:  NA  DME Arranged:    DME Agency:     HH Arranged:    HH Agency:     Status of Service:  Completed, signed off  Medicare Important Message Given:    Date Medicare IM Given:    Medicare IM give by:    Date Additional Medicare IM Given:    Additional Medicare Important Message give by:     If discussed at [place] of Stay Meetings, dates discussed:    Additional Comments: Pt discharged home today. No CM needs noted. Pt has neb machine for home use. Pt does not qualify for home O2 at this time. Christinia Gully Coleman, RN 02/20/2015, 1:25 PM

## 2015-03-23 ENCOUNTER — Ambulatory Visit (HOSPITAL_COMMUNITY): Payer: Self-pay | Admitting: Psychiatry

## 2015-06-08 ENCOUNTER — Ambulatory Visit (HOSPITAL_COMMUNITY): Payer: Self-pay | Admitting: Psychiatry

## 2015-07-21 ENCOUNTER — Encounter: Payer: Medicaid Other | Admitting: Obstetrics & Gynecology

## 2015-10-21 ENCOUNTER — Inpatient Hospital Stay (HOSPITAL_COMMUNITY)
Admission: EM | Admit: 2015-10-21 | Discharge: 2015-10-24 | DRG: 190 | Disposition: A | Discharge status: Home Health | Payer: Medicaid Other | Attending: Internal Medicine | Admitting: Internal Medicine

## 2015-10-21 ENCOUNTER — Encounter (HOSPITAL_COMMUNITY): Payer: Self-pay

## 2015-10-21 ENCOUNTER — Emergency Department (HOSPITAL_COMMUNITY): Payer: Medicaid Other

## 2015-10-21 DIAGNOSIS — J9601 Acute respiratory failure with hypoxia: Secondary | ICD-10-CM | POA: Diagnosis present

## 2015-10-21 DIAGNOSIS — Z801 Family history of malignant neoplasm of trachea, bronchus and lung: Secondary | ICD-10-CM | POA: Diagnosis not present

## 2015-10-21 DIAGNOSIS — Z79891 Long term (current) use of opiate analgesic: Secondary | ICD-10-CM

## 2015-10-21 DIAGNOSIS — Z79899 Other long term (current) drug therapy: Secondary | ICD-10-CM | POA: Diagnosis not present

## 2015-10-21 DIAGNOSIS — J45909 Unspecified asthma, uncomplicated: Secondary | ICD-10-CM | POA: Diagnosis present

## 2015-10-21 DIAGNOSIS — T380X5A Adverse effect of glucocorticoids and synthetic analogues, initial encounter: Secondary | ICD-10-CM | POA: Diagnosis present

## 2015-10-21 DIAGNOSIS — E7800 Pure hypercholesterolemia, unspecified: Secondary | ICD-10-CM | POA: Diagnosis present

## 2015-10-21 DIAGNOSIS — K219 Gastro-esophageal reflux disease without esophagitis: Secondary | ICD-10-CM | POA: Diagnosis present

## 2015-10-21 DIAGNOSIS — Z7982 Long term (current) use of aspirin: Secondary | ICD-10-CM | POA: Diagnosis not present

## 2015-10-21 DIAGNOSIS — Z7951 Long term (current) use of inhaled steroids: Secondary | ICD-10-CM | POA: Diagnosis not present

## 2015-10-21 DIAGNOSIS — R0602 Shortness of breath: Secondary | ICD-10-CM | POA: Diagnosis not present

## 2015-10-21 DIAGNOSIS — R739 Hyperglycemia, unspecified: Secondary | ICD-10-CM | POA: Diagnosis present

## 2015-10-21 DIAGNOSIS — F17210 Nicotine dependence, cigarettes, uncomplicated: Secondary | ICD-10-CM | POA: Diagnosis present

## 2015-10-21 DIAGNOSIS — E785 Hyperlipidemia, unspecified: Secondary | ICD-10-CM | POA: Diagnosis present

## 2015-10-21 DIAGNOSIS — J441 Chronic obstructive pulmonary disease with (acute) exacerbation: Secondary | ICD-10-CM | POA: Diagnosis present

## 2015-10-21 DIAGNOSIS — G894 Chronic pain syndrome: Secondary | ICD-10-CM | POA: Diagnosis present

## 2015-10-21 DIAGNOSIS — R000 Tachycardia, unspecified: Secondary | ICD-10-CM | POA: Diagnosis present

## 2015-10-21 DIAGNOSIS — F419 Anxiety disorder, unspecified: Secondary | ICD-10-CM | POA: Diagnosis present

## 2015-10-21 DIAGNOSIS — G8929 Other chronic pain: Secondary | ICD-10-CM | POA: Diagnosis present

## 2015-10-21 LAB — CBC WITH DIFFERENTIAL/PLATELET
BASOS ABS: 0 10*3/uL (ref 0.0–0.1)
BASOS PCT: 0 %
EOS ABS: 0.2 10*3/uL (ref 0.0–0.7)
EOS PCT: 3 %
HCT: 45.6 % (ref 36.0–46.0)
Hemoglobin: 14.8 g/dL (ref 12.0–15.0)
LYMPHS PCT: 28 %
Lymphs Abs: 1.9 10*3/uL (ref 0.7–4.0)
MCH: 29.8 pg (ref 26.0–34.0)
MCHC: 32.5 g/dL (ref 30.0–36.0)
MCV: 91.9 fL (ref 78.0–100.0)
Monocytes Absolute: 0.6 10*3/uL (ref 0.1–1.0)
Monocytes Relative: 8 %
Neutro Abs: 4.3 10*3/uL (ref 1.7–7.7)
Neutrophils Relative %: 61 %
PLATELETS: 236 10*3/uL (ref 150–400)
RBC: 4.96 MIL/uL (ref 3.87–5.11)
RDW: 14.3 % (ref 11.5–15.5)
WBC: 7 10*3/uL (ref 4.0–10.5)

## 2015-10-21 LAB — BASIC METABOLIC PANEL
ANION GAP: 9 (ref 5–15)
BUN: 17 mg/dL (ref 6–20)
CALCIUM: 8.6 mg/dL — AB (ref 8.9–10.3)
CO2: 25 mmol/L (ref 22–32)
Chloride: 103 mmol/L (ref 101–111)
Creatinine, Ser: 0.61 mg/dL (ref 0.44–1.00)
Glucose, Bld: 111 mg/dL — ABNORMAL HIGH (ref 65–99)
POTASSIUM: 3.6 mmol/L (ref 3.5–5.1)
SODIUM: 137 mmol/L (ref 135–145)

## 2015-10-21 LAB — TROPONIN I

## 2015-10-21 MED ORDER — ROSUVASTATIN CALCIUM 20 MG PO TABS
20.0000 mg | ORAL_TABLET | Freq: Every day | ORAL | Status: DC
  Administered 2015-10-22 – 2015-10-23 (×2): 20 mg via ORAL
  Filled 2015-10-21 (×2): qty 1

## 2015-10-21 MED ORDER — ALBUTEROL (5 MG/ML) CONTINUOUS INHALATION SOLN
10.0000 mg/h | INHALATION_SOLUTION | Freq: Once | RESPIRATORY_TRACT | Status: AC
  Administered 2015-10-21: 10 mg/h via RESPIRATORY_TRACT
  Filled 2015-10-21: qty 20

## 2015-10-21 MED ORDER — MIRABEGRON ER 25 MG PO TB24
50.0000 mg | ORAL_TABLET | Freq: Every day | ORAL | Status: DC
  Administered 2015-10-22 – 2015-10-24 (×3): 50 mg via ORAL
  Filled 2015-10-21 (×3): qty 2

## 2015-10-21 MED ORDER — MOMETASONE FURO-FORMOTEROL FUM 200-5 MCG/ACT IN AERO
2.0000 | INHALATION_SPRAY | Freq: Two times a day (BID) | RESPIRATORY_TRACT | Status: DC
  Administered 2015-10-22 – 2015-10-24 (×5): 2 via RESPIRATORY_TRACT
  Filled 2015-10-21: qty 8.8

## 2015-10-21 MED ORDER — TRAZODONE HCL 50 MG PO TABS
150.0000 mg | ORAL_TABLET | Freq: Every day | ORAL | Status: DC
  Administered 2015-10-21 – 2015-10-22 (×2): 150 mg via ORAL
  Filled 2015-10-21 (×3): qty 3

## 2015-10-21 MED ORDER — OXYCODONE HCL 5 MG PO TABS
5.0000 mg | ORAL_TABLET | Freq: Four times a day (QID) | ORAL | Status: DC | PRN
  Administered 2015-10-22 – 2015-10-23 (×6): 5 mg via ORAL
  Filled 2015-10-21 (×6): qty 1

## 2015-10-21 MED ORDER — MONTELUKAST SODIUM 10 MG PO TABS
10.0000 mg | ORAL_TABLET | Freq: Every day | ORAL | Status: DC
  Administered 2015-10-21 – 2015-10-23 (×3): 10 mg via ORAL
  Filled 2015-10-21 (×3): qty 1

## 2015-10-21 MED ORDER — ONDANSETRON HCL 4 MG/2ML IJ SOLN: 4.0000 mg | Freq: Four times a day (QID) | INTRAMUSCULAR | Status: DC | PRN

## 2015-10-21 MED ORDER — IPRATROPIUM BROMIDE 0.02 % IN SOLN
0.5000 mg | Freq: Once | RESPIRATORY_TRACT | Status: AC
  Administered 2015-10-21: 0.5 mg via RESPIRATORY_TRACT
  Filled 2015-10-21: qty 2.5

## 2015-10-21 MED ORDER — OXYCODONE HCL 5 MG PO TABS
5.0000 mg | ORAL_TABLET | Freq: Three times a day (TID) | ORAL | Status: DC | PRN
  Administered 2015-10-21 (×2): 5 mg via ORAL
  Filled 2015-10-21 (×2): qty 1

## 2015-10-21 MED ORDER — ACETAMINOPHEN 325 MG PO TABS
650.0000 mg | ORAL_TABLET | Freq: Four times a day (QID) | ORAL | Status: DC | PRN
Start: 2015-10-21 — End: 2015-10-24

## 2015-10-21 MED ORDER — SODIUM CHLORIDE 0.9% FLUSH
3.0000 mL | Freq: Two times a day (BID) | INTRAVENOUS | Status: DC
  Administered 2015-10-22 – 2015-10-24 (×5): 3 mL via INTRAVENOUS

## 2015-10-21 MED ORDER — DEXTROSE 5 % IV SOLN
500.0000 mg | Freq: Once | INTRAVENOUS | Status: AC
  Administered 2015-10-21: 500 mg via INTRAVENOUS
  Filled 2015-10-21: qty 500

## 2015-10-21 MED ORDER — ALPRAZOLAM 1 MG PO TABS
1.0000 mg | ORAL_TABLET | Freq: Three times a day (TID) | ORAL | Status: DC | PRN
  Administered 2015-10-21 – 2015-10-24 (×3): 1 mg via ORAL
  Filled 2015-10-21 (×2): qty 1
  Filled 2015-10-21: qty 2
  Filled 2015-10-21: qty 1

## 2015-10-21 MED ORDER — OXYCODONE-ACETAMINOPHEN 5-325 MG PO TABS
1.0000 | ORAL_TABLET | Freq: Four times a day (QID) | ORAL | Status: DC | PRN
Start: 2015-10-21 — End: 2015-10-23
  Administered 2015-10-22 – 2015-10-23 (×6): 1 via ORAL
  Filled 2015-10-21 (×6): qty 1

## 2015-10-21 MED ORDER — AZITHROMYCIN 250 MG PO TABS
500.0000 mg | ORAL_TABLET | Freq: Every day | ORAL | Status: AC
  Administered 2015-10-21: 500 mg via ORAL
  Filled 2015-10-21: qty 2

## 2015-10-21 MED ORDER — SODIUM CHLORIDE 0.9% FLUSH: 3.0000 mL | INTRAVENOUS | Status: DC | PRN

## 2015-10-21 MED ORDER — ALBUTEROL SULFATE (2.5 MG/3ML) 0.083% IN NEBU: 2.5000 mg | INHALATION_SOLUTION | RESPIRATORY_TRACT | Status: DC | PRN

## 2015-10-21 MED ORDER — ACETAMINOPHEN 650 MG RE SUPP
650.0000 mg | Freq: Four times a day (QID) | RECTAL | Status: DC | PRN
Start: 2015-10-21 — End: 2015-10-24

## 2015-10-21 MED ORDER — METHYLPREDNISOLONE SODIUM SUCC 125 MG IJ SOLR
60.0000 mg | Freq: Four times a day (QID) | INTRAMUSCULAR | Status: DC
  Administered 2015-10-21 – 2015-10-24 (×11): 60 mg via INTRAVENOUS
  Filled 2015-10-21 (×11): qty 2

## 2015-10-21 MED ORDER — ENOXAPARIN SODIUM 40 MG/0.4ML ~~LOC~~ SOLN
40.0000 mg | SUBCUTANEOUS | Status: DC
Start: 2015-10-21 — End: 2015-10-24
  Administered 2015-10-21 – 2015-10-23 (×3): 40 mg via SUBCUTANEOUS
  Filled 2015-10-21 (×3): qty 0.4

## 2015-10-21 MED ORDER — METHYLPREDNISOLONE SODIUM SUCC 125 MG IJ SOLR
125.0000 mg | Freq: Once | INTRAMUSCULAR | Status: AC
  Administered 2015-10-21: 125 mg via INTRAVENOUS
  Filled 2015-10-21: qty 2

## 2015-10-21 MED ORDER — IPRATROPIUM-ALBUTEROL 0.5-2.5 (3) MG/3ML IN SOLN
3.0000 mL | Freq: Four times a day (QID) | RESPIRATORY_TRACT | Status: DC
  Administered 2015-10-21 – 2015-10-22 (×4): 3 mL via RESPIRATORY_TRACT
  Filled 2015-10-21 (×4): qty 3

## 2015-10-21 MED ORDER — AMPHETAMINE-DEXTROAMPHET ER 30 MG PO CP24
90.0000 mg | ORAL_CAPSULE | ORAL | Status: DC
  Administered 2015-10-22 – 2015-10-24 (×2): 90 mg via ORAL
  Filled 2015-10-21 (×4): qty 3

## 2015-10-21 MED ORDER — GUAIFENESIN ER 600 MG PO TB12
1200.0000 mg | ORAL_TABLET | Freq: Two times a day (BID) | ORAL | Status: DC
  Administered 2015-10-21 – 2015-10-24 (×6): 1200 mg via ORAL
  Filled 2015-10-21 (×6): qty 2

## 2015-10-21 MED ORDER — OXYCODONE-ACETAMINOPHEN 5-325 MG PO TABS
1.0000 | ORAL_TABLET | Freq: Three times a day (TID) | ORAL | Status: DC | PRN
  Administered 2015-10-21: 1 via ORAL
  Filled 2015-10-21 (×2): qty 1

## 2015-10-21 MED ORDER — PANTOPRAZOLE SODIUM 40 MG PO TBEC
40.0000 mg | DELAYED_RELEASE_TABLET | Freq: Every day | ORAL | Status: DC
  Administered 2015-10-22 – 2015-10-24 (×3): 40 mg via ORAL
  Filled 2015-10-21 (×3): qty 1

## 2015-10-21 MED ORDER — AZITHROMYCIN 250 MG PO TABS
250.0000 mg | ORAL_TABLET | Freq: Every day | ORAL | Status: DC
  Administered 2015-10-22 – 2015-10-24 (×3): 250 mg via ORAL
  Filled 2015-10-21 (×3): qty 1

## 2015-10-21 MED ORDER — IPRATROPIUM-ALBUTEROL 0.5-2.5 (3) MG/3ML IN SOLN: 3.0000 mL | RESPIRATORY_TRACT | Status: DC | PRN

## 2015-10-21 MED ORDER — ONDANSETRON HCL 4 MG PO TABS: 4.0000 mg | ORAL_TABLET | Freq: Four times a day (QID) | ORAL | Status: DC | PRN

## 2015-10-21 MED ORDER — SODIUM CHLORIDE 0.9 % IV SOLN: 250.0000 mL | INTRAVENOUS | Status: DC | PRN

## 2015-10-21 MED ORDER — ASPIRIN EC 81 MG PO TBEC
81.0000 mg | DELAYED_RELEASE_TABLET | Freq: Every day | ORAL | Status: DC
  Administered 2015-10-22 – 2015-10-24 (×3): 81 mg via ORAL
  Filled 2015-10-21 (×3): qty 1

## 2015-10-21 NOTE — ED Notes (Signed)
RT at the bedside for breathing treatment.

## 2015-10-21 NOTE — H&P (Signed)
History and Physical    Theresa Pace NID:[id number] DOB: 1961-02-17 DOA: 10/21/2015  PCP: Gar Ponto, MD  Patient coming from: home  Chief Complaint: Shortness of breath  HPI: Theresa Pace is a 55 y.o. female with medical history significant of COPD who comes in with progressive shortness of breath. Patient reports that approximately 2-3 days ago she had onset of sore throat, congestion, upper respiratory tract type symptoms. She reports that her granddaughter recently had similar symptoms and thinks that she may have caught an infection from her. As her symptoms progress, she began having shortness of breath, wheezing, cough productive of green colored sputum. She denies any fever or chest pain. She's not had any vomiting, dysuria or diarrhea. Since her shortness of breath failed to improve with nebulizer treatments, she presents to the ER for evaluation.  ED Course: Chest x-ray was found to be unremarkable. The patient was treated with intravenous steroids and nebulizer treatments with some improvement of her symptoms. She's been referred for management of COPD exacerbation.  Review of Systems: As per HPI otherwise 10 point review of systems negative.    Past Medical History  Diagnosis Date  . Asthma   . Depression   . Frequent urination at night   . IBS (irritable bowel syndrome)   . GERD (gastroesophageal reflux disease)   . Hypercholesteremia   . COPD (chronic obstructive pulmonary disease) [facility])     Past Surgical History  Procedure Laterality Date  . Abdominal hysterectomy    . Appendectomy       reports that she has been smoking Cigarettes.  She has been smoking about 0.25 packs per day. She does not have any smokeless tobacco history on file. She reports that she does not drink alcohol or use illicit drugs.  She quit smoking a few months ago. She is normally ambulatory and independent  Allergies  Allergen Reactions  . Methadone Hives  . Aspirin Other (See Comments)     Stomach Pain    Family history: mother died of lung cancer. Several other family members also have lung cancer  Prior to Admission medications   Medication Sig Start Date End Date Taking? Authorizing Provider  acetaminophen (TYLENOL) 500 MG tablet Take 1,000-1,500 mg by mouth every 6 (six) hours as needed for moderate pain.   Yes Historical Provider, MD  albuterol (PROVENTIL HFA;VENTOLIN HFA) 108 (90 BASE) MCG/ACT inhaler Inhale 2 puffs into the lungs every 6 (six) hours as needed.   Yes Historical Provider, MD  ALPRAZolam Duanne Moron) 1 MG tablet Take 1 mg by mouth 3 (three) times daily.   Yes Historical Provider, MD  amphetamine-dextroamphetamine (ADDERALL XR) 30 MG 24 hr capsule Take 90 mg by mouth every morning.   Yes Historical Provider, MD  aspirin EC 81 MG tablet Take 81 mg by mouth daily.   Yes Historical Provider, MD  ciclopirox (PENLAC) 8 % solution Apply 1 application topically at bedtime. Apply over nail and surrounding skin. Apply daily over previous coat. After seven (7) days, may remove with alcohol and continue cycle.   Yes Historical Provider, MD  fluticasone-salmeterol (ADVAIR HFA) 230-21 MCG/ACT inhaler Inhale 2 puffs into the lungs 2 (two) times daily.   Yes Historical Provider, MD  halobetasol (ULTRAVATE) 0.05 % cream Apply 1 application topically 2 (two) times daily. eczema   Yes Historical Provider, MD  ipratropium-albuterol (DUONEB) 0.5-2.5 (3) MG/3ML SOLN Take 3 mLs by nebulization 4 (four) times daily. 02/20/15  Yes Samuella Cota, MD  Loperamide HCl (  ANTI-DIARRHEAL PO) Take 2 capsules by mouth daily as needed (constipation).   Yes Historical Provider, MD  loratadine (CLARITIN) 10 MG tablet Take 10 mg by mouth daily as needed for allergies.    Yes Historical Provider, MD  mirabegron ER (MYRBETRIQ) 50 MG TB24 tablet Take 50 mg by mouth daily.   Yes Historical Provider, MD  montelukast (SINGULAIR) 10 MG tablet Take 10 mg by mouth at bedtime.   Yes Historical Provider, MD    naphazoline (NAPHCON) 0.1 % ophthalmic solution Place 1 drop into both eyes 4 (four) times daily as needed for irritation or allergies.    Yes Historical Provider, MD  omeprazole (PRILOSEC) 20 MG capsule Take 20 mg by mouth daily.   Yes Historical Provider, MD  oxyCODONE-acetaminophen (PERCOCET) 10-325 MG per tablet Take 1 tablet by mouth 3 (three) times daily.   Yes Historical Provider, MD  rosuvastatin (CRESTOR) 20 MG tablet Take 20 mg by mouth daily.   Yes Historical Provider, MD  traZODone (DESYREL) 50 MG tablet Take 150 mg by mouth at bedtime.    Yes Historical Provider, MD  predniSONE (DELTASONE) 10 MG tablet Take 40 mg by mouth daily for 3 days, then take 20 mg by mouth daily for 3 days, then take 10 mg by mouth daily for 3 days, then stop. Patient not taking: Reported on 10/21/2015 02/20/15   Samuella Cota, MD    Physical Exam: Filed Vitals:   10/21/15 1406 10/21/15 1430 10/21/15 1500 10/21/15 1600  BP: 125/83 124/79 138/96 138/82  Pulse: 121 119 118 112  Temp:      TempSrc:      Resp: 35 21 27   Height:      Weight:      SpO2: 86% 94% 97% 94%      Constitutional: NAD, calm, comfortable Filed Vitals:   10/21/15 1406 10/21/15 1430 10/21/15 1500 10/21/15 1600  BP: 125/83 124/79 138/96 138/82  Pulse: 121 119 118 112  Temp:      TempSrc:      Resp: 35 21 27   Height:      Weight:      SpO2: 86% 94% 97% 94%   Eyes: PERRL, lids and conjunctivae normal ENMT: Mucous membranes are moist. Posterior pharynx clear of any exudate or lesions.Normal dentition.  Neck: normal, supple, no masses, no thyromegaly Respiratory: diminished breath sounds with mild wheezing and scattered rhonchi. Normal respiratory effort. No accessory muscle use.  Cardiovascular: Regular rate and rhythm, no murmurs / rubs / gallops. No extremity edema. 2+ pedal pulses. No carotid bruits.  Abdomen: no tenderness, no masses palpated. No hepatosplenomegaly. Bowel sounds positive.  Musculoskeletal: no  clubbing / cyanosis. No joint deformity upper and lower extremities. Good ROM, no contractures. Normal muscle tone.  Skin: no rashes, lesions, ulcers. No induration Neurologic: CN 2-12 grossly intact. Sensation intact, DTR normal. Strength 5/5 in all 4.  Psychiatric: Normal judgment and insight. Alert and oriented x 3. Normal mood.   Labs on Admission: I have personally reviewed following labs and imaging studies  CBC:  Recent Labs Lab 10/21/15 1124  WBC 7.0  NEUTROABS 4.3  HGB 14.8  HCT 45.6  MCV 91.9  PLT 676   Basic Metabolic Panel:  Recent Labs Lab 10/21/15 1124  NA 137  K 3.6  CL 103  CO2 25  GLUCOSE 111*  BUN 17  CREATININE 0.61  CALCIUM 8.6*   GFR: Estimated Creatinine Clearance: 75.9 mL/min (by C-G formula based on Cr of 0.61).  Liver Function Tests: No results for input(s): AST, ALT, ALKPHOS, BILITOT, PROT, ALBUMIN in the last 168 hours. No results for input(s): LIPASE, AMYLASE in the last 168 hours. No results for input(s): AMMONIA in the last 168 hours. Coagulation Profile: No results for input(s): INR, PROTIME in the last 168 hours. Cardiac Enzymes:  Recent Labs Lab 10/21/15 1124  TROPONINI <0.03   BNP (last 3 results) No results for input(s): PROBNP in the last 8760 hours. HbA1C: No results for input(s): HGBA1C in the last 72 hours. CBG: No results for input(s): GLUCAP in the last 168 hours. Lipid Profile: No results for input(s): CHOL, HDL, LDLCALC, TRIG, CHOLHDL, LDLDIRECT in the last 72 hours. Thyroid Function Tests: No results for input(s): TSH, T4TOTAL, FREET4, T3FREE, THYROIDAB in the last 72 hours. Anemia Panel: No results for input(s): VITAMINB12, FOLATE, FERRITIN, TIBC, IRON, RETICCTPCT in the last 72 hours. Urine analysis:    Component Value Date/Time   COLORURINE YELLOW 02/21/2012 0055   APPEARANCEUR CLEAR 02/21/2012 0055   LABSPEC <1.005* 02/21/2012 0055   PHURINE 6.0 02/21/2012 0055   GLUCOSEU NEGATIVE 02/21/2012 0055    HGBUR SMALL* 02/21/2012 0055   BILIRUBINUR NEGATIVE 02/21/2012 0055   KETONESUR NEGATIVE 02/21/2012 0055   PROTEINUR NEGATIVE 02/21/2012 0055   UROBILINOGEN 0.2 02/21/2012 0055   NITRITE NEGATIVE 02/21/2012 0055   LEUKOCYTESUR NEGATIVE 02/21/2012 0055   Sepsis Labs: !!!!!!!!!!!!!!!!!!!!!!!!!!!!!!!!!!!!!!!!!!!! '@LABRCNTIP'$ (procalcitonin:4,lacticidven:4) )No results found for this or any previous visit (from the past 240 hour(s)).   Radiological Exams on Admission: Dg Chest 2 View  10/21/2015  CLINICAL DATA:  Shortness of breath and cough for several days EXAM: CHEST  2 VIEW COMPARISON:  February 16, 2015 FINDINGS: There is no edema or consolidation. Heart size and pulmonary vascularity are normal. No adenopathy. No bone lesions. IMPRESSION: No edema or consolidation. Electronically Signed   By: Lowella Grip III M.D.   On: 10/21/2015 12:00    EKG: Independently reviewed. Sinus tachycardia  Assessment/Plan Active Problems:   COPD exacerbation (HCC)   Acute respiratory failure with hypoxia (HCC)   Acute exacerbation of chronic obstructive pulmonary disease (COPD) (HCC)   Anxiety   GERD (gastroesophageal reflux disease)   Chronic pain   Hyperlipidemia  1. COPD exacerbation. Likely precipitated by an upper respiratory tract infection. Start the patient on intravenous steroids, continue nebulizer treatments, start on azithromycin course. Continue pulmonary hygiene.  2. Acute respiratory failure with hypoxia. Likely related to #1. Wean down oxygen as tolerated.  3. GERD. Continue PPI  4. Anxiety. Continue home dose of Xanax.  5. Hyperlipidemia. Continue statin  6. Chronic pain syndrome. Continue home dose of Percocet.   DVT prophylaxis: lovenox Code Status: full code Family Communication: discussed with patient Disposition Plan: discharge home once improved Consults called:  Admission status: inpatient, medical floor   MEMON,JEHANZEB MD [facility] Pager  [phone]  If 7PM-7AM, please contact night-coverage [email or web address] Password [facility]  10/21/2015, 5:28 PM

## 2015-10-21 NOTE — ED Notes (Signed)
Patient ambulatory to restroom with steady gait, family at the bedside.

## 2015-10-21 NOTE — ED Notes (Signed)
Attempted x2 to call report, spoke with charge, everyone is busy, ask if ok to bring pt up and give bed side report, that was ok.

## 2015-10-21 NOTE — ED Notes (Signed)
Pt sitting up in bed to eat, 02 low , placed on 2L 02 and rechecked vitals

## 2015-10-21 NOTE — ED Notes (Signed)
Called Hospitalist for Inpatient orders for back, orders will be placed for pt

## 2015-10-21 NOTE — ED Notes (Signed)
Pt reports productive cough with green sputum, fever, and sob x 4 or 5 days.

## 2015-10-21 NOTE — ED Provider Notes (Signed)
CSN: [id number]     Arrival date & time 10/21/15  1052 History  By signing my name below, I, Georgette Shell, attest that this documentation has been prepared under the direction and in the presence of Elnora Morrison, MD. Electronically Signed: Georgette Shell, ED Scribe. 10/21/2015. 12:50 PM.   Chief Complaint  Patient presents with  . Shortness of Breath    The history is provided by the patient. No language interpreter was used.   HPI Comments: Theresa Pace is a 55 y.o. female with h/o COPD who presents to the Emergency Department complaining of persistent shortness of breath, onset three days ago. Patient also has associated chills, coughing, and wheezing. Pt reports that this is similar to her past episodes of COPD. Per pt, she has no cardiac issues, or history of blood clots, no recent surgeries. Pt has been admitted to the hospital for her COPD but has not been intubated. Pt is currently a daily smoker. Patient denies fever, calf tenderness, and leg swelling.  Past Medical History  Diagnosis Date  . Asthma   . Depression   . Frequent urination at night   . IBS (irritable bowel syndrome)   . GERD (gastroesophageal reflux disease)   . Hypercholesteremia   . COPD (chronic obstructive pulmonary disease) [facility])    Past Surgical History  Procedure Laterality Date  . Abdominal hysterectomy    . Appendectomy     No family history on file. Social History  Substance Use Topics  . Smoking status: Current Every Day Smoker -- 0.25 packs/day    Types: Cigarettes  . Smokeless tobacco: None  . Alcohol Use: No   OB History    No data available     Review of Systems  Constitutional: Positive for chills. Negative for fever.  Respiratory: Positive for cough, shortness of breath and wheezing.   All other systems reviewed and are negative.  Allergies  Methadone and Aspirin  Home Medications   Prior to Admission medications   Medication Sig Start Date End Date Taking? Authorizing Provider   acetaminophen (TYLENOL) 500 MG tablet Take 1,000-1,500 mg by mouth every 6 (six) hours as needed for moderate pain.   Yes Historical Provider, MD  albuterol (PROVENTIL HFA;VENTOLIN HFA) 108 (90 BASE) MCG/ACT inhaler Inhale 2 puffs into the lungs every 6 (six) hours as needed.   Yes Historical Provider, MD  ALPRAZolam Duanne Moron) 1 MG tablet Take 1 mg by mouth 3 (three) times daily.   Yes Historical Provider, MD  amphetamine-dextroamphetamine (ADDERALL XR) 30 MG 24 hr capsule Take 90 mg by mouth every morning.   Yes Historical Provider, MD  aspirin EC 81 MG tablet Take 81 mg by mouth daily.   Yes Historical Provider, MD  ciclopirox (PENLAC) 8 % solution Apply 1 application topically at bedtime. Apply over nail and surrounding skin. Apply daily over previous coat. After seven (7) days, may remove with alcohol and continue cycle.   Yes Historical Provider, MD  fluticasone-salmeterol (ADVAIR HFA) 230-21 MCG/ACT inhaler Inhale 2 puffs into the lungs 2 (two) times daily.   Yes Historical Provider, MD  halobetasol (ULTRAVATE) 0.05 % cream Apply 1 application topically 2 (two) times daily. eczema   Yes Historical Provider, MD  ipratropium-albuterol (DUONEB) 0.5-2.5 (3) MG/3ML SOLN Take 3 mLs by nebulization 4 (four) times daily. 02/20/15  Yes Samuella Cota, MD  Loperamide HCl (ANTI-DIARRHEAL PO) Take 2 capsules by mouth daily as needed (constipation).   Yes Historical Provider, MD  loratadine (CLARITIN) 10 MG tablet  Take 10 mg by mouth daily as needed for allergies.    Yes Historical Provider, MD  mirabegron ER (MYRBETRIQ) 50 MG TB24 tablet Take 50 mg by mouth daily.   Yes Historical Provider, MD  montelukast (SINGULAIR) 10 MG tablet Take 10 mg by mouth at bedtime.   Yes Historical Provider, MD  naphazoline (NAPHCON) 0.1 % ophthalmic solution Place 1 drop into both eyes 4 (four) times daily as needed for irritation or allergies.    Yes Historical Provider, MD  omeprazole (PRILOSEC) 20 MG capsule Take 20 mg by  mouth daily.   Yes Historical Provider, MD  oxyCODONE-acetaminophen (PERCOCET) 10-325 MG per tablet Take 1 tablet by mouth 3 (three) times daily.   Yes Historical Provider, MD  rosuvastatin (CRESTOR) 20 MG tablet Take 20 mg by mouth daily.   Yes Historical Provider, MD  traZODone (DESYREL) 50 MG tablet Take 150 mg by mouth at bedtime.    Yes Historical Provider, MD  predniSONE (DELTASONE) 10 MG tablet Take 40 mg by mouth daily for 3 days, then take 20 mg by mouth daily for 3 days, then take 10 mg by mouth daily for 3 days, then stop. Patient not taking: Reported on 10/21/2015 02/20/15   Samuella Cota, MD   BP 140/87 mmHg  Pulse 104  Temp(Src) 98.2 F (36.8 C) (Temporal)  Resp 40  Ht '5\' 2"'$  (1.575 m)  Wt 160 lb (72.576 kg)  BMI 29.26 kg/m2  SpO2 91% Physical Exam  Constitutional: She appears well-developed and well-nourished.  HENT:  Head: Normocephalic.  Eyes: Conjunctivae are normal.  Cardiovascular: Regular rhythm.  Tachycardia present.   Mild tachycardia  Pulmonary/Chest: Effort normal. Tachypnea noted. No respiratory distress. She has wheezes.  Expiratory wheezing bilaterally Mild tachypnea Significant expiratory wheezing  Abdominal: Soft. She exhibits no distension. There is no tenderness.  Musculoskeletal: Normal range of motion.  Mild swelling in both lower extremities. No focal calf tenderness.  Neurological: She is alert.  Skin: Skin is warm and dry.  Psychiatric: She has a normal mood and affect. Her behavior is normal.  Nursing note and vitals reviewed.   ED Course  Procedures  DIAGNOSTIC STUDIES: Oxygen Saturation is 92% on RA, low by my interpretation.    COORDINATION OF CARE: 12:04 PM Discussed treatment plan with pt at bedside which includes continuous nebulizer and blood work and pt agreed to plan.  Labs Review Labs Reviewed  BASIC METABOLIC PANEL - Abnormal; Notable for the following:    Glucose, Bld 111 (*)    Calcium 8.6 (*)    All other components  within normal limits  CBC WITH DIFFERENTIAL/PLATELET  TROPONIN I    Imaging Review Dg Chest 2 View  10/21/2015  CLINICAL DATA:  Shortness of breath and cough for several days EXAM: CHEST  2 VIEW COMPARISON:  February 16, 2015 FINDINGS: There is no edema or consolidation. Heart size and pulmonary vascularity are normal. No adenopathy. No bone lesions. IMPRESSION: No edema or consolidation. Electronically Signed   By: Lowella Grip III M.D.   On: 10/21/2015 12:00   I have personally reviewed and evaluated these images and lab results as part of my medical decision-making.   EKG Interpretation   Date/Time:  Wednesday Oct 21 2015 11:34:52 EDT Ventricular Rate:  110 PR Interval:  148 QRS Duration: 96 QT Interval:  353 QTC Calculation: 477 R Axis:   74 Text Interpretation:  Sinus tachycardia Nonspecific T abnormalities,  lateral leads Baseline wander in lead(s) V5 similar previous Confirmed  by  Reather Converse MD, Vonna Kotyk [phone]) on 10/21/2015 12:02:49 PM      MDM   Final diagnoses:  None  Acute copd exac  Patient presents with clinical concern for COPD exacerbation. Chest x-ray no obvious infiltrate. Mild tachypnea however no distress. Continuous neb in the ER was steroids. Plan for admission to the hospital.  The patients results and plan were reviewed and discussed.   Any x-rays performed were independently reviewed by myself.   Differential diagnosis were considered with the presenting HPI.  Medications  ipratropium-albuterol (DUONEB) 0.5-2.5 (3) MG/3ML nebulizer solution 3 mL (not administered)  ipratropium (ATROVENT) nebulizer solution 0.5 mg (0.5 mg Nebulization Given 10/21/15 1221)  albuterol (PROVENTIL,VENTOLIN) solution continuous neb (10 mg/hr Nebulization Given 10/21/15 1221)  methylPREDNISolone sodium succinate (SOLU-MEDROL) 125 mg/2 mL injection 125 mg (125 mg Intravenous Given 10/21/15 1228)    Filed Vitals:   10/21/15 1056 10/21/15 1221  BP: 140/87   Pulse: 104    Temp: 98.2 F (36.8 C)   TempSrc: Temporal   Resp: 40   Height: '5\' 2"'$  (1.575 m)   Weight: 160 lb (72.576 kg)   SpO2: 92% 91%    Final diagnoses:  None    Admission/ observation were discussed with the admitting physician, patient and/or family and they are comfortable with the plan.     Elnora Morrison, MD 10/21/15 [phone]

## 2015-10-21 NOTE — ED Notes (Signed)
Pt eating lunch meal tray

## 2015-10-22 DIAGNOSIS — R000 Tachycardia, unspecified: Secondary | ICD-10-CM | POA: Diagnosis present

## 2015-10-22 LAB — CBC
HCT: 46.6 % — ABNORMAL HIGH (ref 36.0–46.0)
Hemoglobin: 15.1 g/dL — ABNORMAL HIGH (ref 12.0–15.0)
MCH: 30 pg (ref 26.0–34.0)
MCHC: 32.4 g/dL (ref 30.0–36.0)
MCV: 92.6 fL (ref 78.0–100.0)
PLATELETS: 265 10*3/uL (ref 150–400)
RBC: 5.03 MIL/uL (ref 3.87–5.11)
RDW: 13.9 % (ref 11.5–15.5)
WBC: 11 10*3/uL — ABNORMAL HIGH (ref 4.0–10.5)

## 2015-10-22 LAB — BASIC METABOLIC PANEL
Anion gap: 8 (ref 5–15)
BUN: 19 mg/dL (ref 6–20)
CALCIUM: 9.1 mg/dL (ref 8.9–10.3)
CO2: 25 mmol/L (ref 22–32)
CREATININE: 0.72 mg/dL (ref 0.44–1.00)
Chloride: 103 mmol/L (ref 101–111)
GFR calc Af Amer: >60 mL/min (ref >60)
GLUCOSE: 184 mg/dL — AB (ref 65–99)
Potassium: 4.7 mmol/L (ref 3.5–5.1)
SODIUM: 136 mmol/L (ref 135–145)

## 2015-10-22 MED ORDER — LEVALBUTEROL HCL 0.63 MG/3ML IN NEBU: 0.6300 mg | INHALATION_SOLUTION | RESPIRATORY_TRACT | Status: DC | PRN

## 2015-10-22 MED ORDER — LEVALBUTEROL HCL 0.63 MG/3ML IN NEBU
0.6300 mg | INHALATION_SOLUTION | RESPIRATORY_TRACT | Status: DC
  Administered 2015-10-22 – 2015-10-24 (×10): 0.63 mg via RESPIRATORY_TRACT
  Filled 2015-10-22 (×11): qty 3

## 2015-10-22 MED ORDER — IPRATROPIUM BROMIDE 0.02 % IN SOLN
0.5000 mg | RESPIRATORY_TRACT | Status: DC
  Administered 2015-10-22 – 2015-10-24 (×10): 0.5 mg via RESPIRATORY_TRACT
  Filled 2015-10-22 (×11): qty 2.5

## 2015-10-22 MED ORDER — DILTIAZEM HCL 30 MG PO TABS
30.0000 mg | ORAL_TABLET | Freq: Three times a day (TID) | ORAL | Status: DC
  Administered 2015-10-22 – 2015-10-24 (×5): 30 mg via ORAL
  Filled 2015-10-22 (×5): qty 1

## 2015-10-22 NOTE — Progress Notes (Signed)
Inpatient Diabetes Program Recommendations  AACE/ADA: New Consensus Statement on Inpatient Glycemic Control (2015)  Target Ranges:  Prepandial:   less than 140 mg/dL      Peak postprandial:   less than 180 mg/dL (1-2 hours)      Critically ill patients:  140 - 180 mg/dL   Results for Theresa Pace, Theresa Pace (MRN [id number]) as of 10/22/2015 11:29  Ref. Range 10/21/2015 11:24 10/22/2015 06:11  Glucose Latest Ref Range: 65-99 mg/dL 111 (H) 184 (H)  Results for Theresa Pace, Theresa Pace (MRN [id number]) as of 10/22/2015 11:29  Ref. Range 08/12/2007 06:20  Hemoglobin A1C Unknown 6.1.Marland KitchenMarland Kitchen   Review of Glycemic Control  Diabetes history: No Outpatient Diabetes medications: NA Current orders for Inpatient glycemic control: None  Inpatient Diabetes Program Recommendations: Correction (SSI): Lab glucose 184 mg/dl today. While inpatient and ordered steroids, please consider ordering CBGs with Novolog correction scale. HgbA1C: Last A1C in the chart was 6.1% on 08/12/2007. No noted history in chart of prediabetes or diabetes. Please order an A1C to evaluate glycemic control over the past 2-3 months.  Thanks, Barnie Alderman, RN, MSN, CDE Diabetes Coordinator Inpatient Diabetes Program [phone] (Team Pager from [place] to [place]) [phone] ([facility] office) [phone] [facility] office) [phone] [facility] office)

## 2015-10-22 NOTE — Care Management Note (Signed)
Case Management Note  Patient Details  Name: Theresa Pace MRN: [id number] Date of Birth: 01-07-1961  Subjective/Objective:    SPoke with patient who is alert and oriented from home , Has nebulizer machine at home.  No home O2 , has COPD but continues to smoke. Lives with son and husband ., Stated that she drives self. Denies issues filling medications.  Walks with out assist of cane or walker. Barriers to ambulation ore chronic knee and back pain. Followed by [facility]. PCP is Dr Olena Heckle.                Action/Plan: Home with self care. Not home bound.   Expected Discharge Date:                  Expected Discharge Plan:  Home/Self Care  In-House Referral:     Discharge planning Services  CM Consult  Post Acute Care Choice:    Choice offered to:     DME Arranged:    DME Agency:     HH Arranged:    HH Agency:     Status of Service:  In process, will continue to follow  Medicare Important Message Given:    Date Medicare IM Given:    Medicare IM give by:    Date Additional Medicare IM Given:    Additional Medicare Important Message give by:     If discussed at [place] of Stay Meetings, dates discussed:    Additional Comments:  Alvie Heidelberg, RN 10/22/2015, 4:03 PM

## 2015-10-22 NOTE — Progress Notes (Signed)
PROGRESS NOTE    Theresa Pace  GHW:[id number] DOB: June 18, 1960 DOA: 10/21/2015 PCP: Gar Ponto, MD   Brief Narrative:  55 y.o. female with medical history significant of COPD who comes in with progressive shortness of breath. Patient reports that approximately 2-3 days ago she had onset of sore throat, congestion, upper respiratory tract type symptoms. In the ED, Chest x-ray was found to be unremarkable. The patient was treated with intravenous steroids and nebulizer treatments with some improvement of her symptoms. She's been referred for management of COPD exacerbation   Assessment & Plan:   Active Problems:   COPD exacerbation (HCC)   Acute respiratory failure with hypoxia (HCC)   Acute exacerbation of chronic obstructive pulmonary disease (COPD) (HCC)   Anxiety   GERD (gastroesophageal reflux disease)   Chronic pain   Hyperlipidemia   1. COPD exacerbation, still very short of breath. Likely precipitated by an upper respiratory tract infection. Continue IV steroids, nebulizer treatments, and azithromycin course. Continue pulmonary hygiene. 2. Acute respiratory failure with hypoxia. Likely related to #1.  Continue to wean down oxygen as tolerated. 3. GERD. Continue PPI 4. Anxiety. Continue home dose of Xanax. 5. Hyperlipidemia. Continue statin 6. Chronic pain syndrome. Continue home dose of Percocet. 7. Sinus tachycardia. Likely reactive to underlying respiratory issues since she is still short of breath and wheezing. Review of telemetry indicate bursts of SVT. Will start on low dose cardizem and monitor on telemetry.   DVT prophylaxis: Lovenox Code Status: Full Family Communication: No family bedside Disposition Plan: Home once improved.   Consultants:   None  Procedures:   None  Antimicrobials:   Zithromax 5/31>>   Subjective: Still has moderate shortness of breath that has not improved since yesterday. Heart racing has remained consistent. Denies any chest  pain or heaviness.   Objective: Filed Vitals:   10/21/15 1700 10/21/15 1730 10/21/15 2030 10/21/15 2100  BP: 115/68 118/76  122/71  Pulse: 102 102  105  Temp:    98.2 F (36.8 C)  TempSrc:    Oral  Resp: '16 16  20  '$ Height:      Weight:      SpO2: 91% 92% 93% 95%   No intake or output data in the 24 hours ending 10/22/15 0727 Filed Weights   10/21/15 1056  Weight: 72.576 kg (160 lb)    Examination:  General exam: NAD  Respiratory system: Mild wheeze Bilaterally. Respiratory effort normal. Cardiovascular system: Tachycardiac. No JVD, murmurs, rubs, gallops or clicks. No pedal edema. Gastrointestinal system: Abdomen is nondistended, soft and nontender. No organomegaly or masses felt. Normal bowel sounds heard. Central nervous system: Alert and oriented. No focal neurological deficits. Extremities: Symmetric 5 x 5 power. Skin: No rashes, lesions or ulcers Psychiatry: Judgement and insight appear normal. Mood & affect appropriate.     Data Reviewed: I have personally reviewed following labs and imaging studies  CBC:  Recent Labs Lab 10/21/15 1124 10/22/15 0611  WBC 7.0 11.0*  NEUTROABS 4.3  --   HGB 14.8 15.1*  HCT 45.6 46.6*  MCV 91.9 92.6  PLT 236 789   Basic Metabolic Panel:  Recent Labs Lab 10/21/15 1124  NA 137  K 3.6  CL 103  CO2 25  GLUCOSE 111*  BUN 17  CREATININE 0.61  CALCIUM 8.6*   Cardiac Enzymes:  Recent Labs Lab 10/21/15 1124  TROPONINI <0.03   Urine analysis:    Component Value Date/Time   COLORURINE YELLOW 02/21/2012 0055   [place]  02/21/2012 0055   LABSPEC <1.005* 02/21/2012 0055   PHURINE 6.0 02/21/2012 0055   GLUCOSEU NEGATIVE 02/21/2012 0055   HGBUR SMALL* 02/21/2012 0055   BILIRUBINUR NEGATIVE 02/21/2012 0055   KETONESUR NEGATIVE 02/21/2012 0055   PROTEINUR NEGATIVE 02/21/2012 0055   UROBILINOGEN 0.2 02/21/2012 0055   NITRITE NEGATIVE 02/21/2012 0055   LEUKOCYTESUR NEGATIVE 02/21/2012 0055   Sepsis  Labs: '@LABRCNTIP'$ (procalcitonin:4,lacticidven:4)  )No results found for this or any previous visit (from the past 240 hour(s)).       Radiology Studies: Dg Chest 2 View  10/21/2015  CLINICAL DATA:  Shortness of breath and cough for several days EXAM: CHEST  2 VIEW COMPARISON:  February 16, 2015 FINDINGS: There is no edema or consolidation. Heart size and pulmonary vascularity are normal. No adenopathy. No bone lesions. IMPRESSION: No edema or consolidation. Electronically Signed   By: Lowella Grip III M.D.   On: 10/21/2015 12:00        Scheduled Meds: . amphetamine-dextroamphetamine  90 mg Oral BH-q7a  . aspirin EC  81 mg Oral Daily  . azithromycin  250 mg Oral Daily  . enoxaparin (LOVENOX) injection  40 mg Subcutaneous Q24H  . guaiFENesin  1,200 mg Oral BID  . ipratropium-albuterol  3 mL Nebulization QID  . methylPREDNISolone (SOLU-MEDROL) injection  60 mg Intravenous Q6H  . mirabegron ER  50 mg Oral Daily  . mometasone-formoterol  2 puff Inhalation BID  . montelukast  10 mg Oral QHS  . pantoprazole  40 mg Oral Daily  . rosuvastatin  20 mg Oral q1800  . sodium chloride flush  3 mL Intravenous Q12H  . traZODone  150 mg Oral QHS   Continuous Infusions:    LOS: 1 day    Time spent: 20 minutes     Kathie Dike, MD  [facility] Pager [phone]  If 7PM-7AM, please contact night-coverage [email or web address] Password [email or web address] 10/22/2015, 7:27 AM     By signing my name below, I, Rennis Harding, attest that this documentation has been prepared under the direction and in the presence of Kathie Dike, MD. Electronically signed: Rennis Harding, Scribe. 10/22/2015   I, Dr. Kathie Dike, personally performed the services described in this documentaiton. All medical record entries made by the scribe were at my direction and in my presence. I have reviewed the chart and agree that the record reflects my personal performance and is accurate and  complete  Kathie Dike, MD, 10/22/2015 7:24 PM

## 2015-10-23 LAB — BASIC METABOLIC PANEL
ANION GAP: 11 (ref 5–15)
BUN: 22 mg/dL — ABNORMAL HIGH (ref 6–20)
CALCIUM: 9.2 mg/dL (ref 8.9–10.3)
CO2: 24 mmol/L (ref 22–32)
Chloride: 101 mmol/L (ref 101–111)
Creatinine, Ser: 0.76 mg/dL (ref 0.44–1.00)
Glucose, Bld: 194 mg/dL — ABNORMAL HIGH (ref 65–99)
Potassium: 3.8 mmol/L (ref 3.5–5.1)
Sodium: 136 mmol/L (ref 135–145)

## 2015-10-23 LAB — CBC
HCT: 46.7 % — ABNORMAL HIGH (ref 36.0–46.0)
HEMOGLOBIN: 15.5 g/dL — AB (ref 12.0–15.0)
MCH: 30.2 pg (ref 26.0–34.0)
MCHC: 33.2 g/dL (ref 30.0–36.0)
MCV: 91 fL (ref 78.0–100.0)
Platelets: 283 10*3/uL (ref 150–400)
RBC: 5.13 MIL/uL — AB (ref 3.87–5.11)
RDW: 14 % (ref 11.5–15.5)
WBC: 16.4 10*3/uL — ABNORMAL HIGH (ref 4.0–10.5)

## 2015-10-23 MED ORDER — BENZONATATE 100 MG PO CAPS
200.0000 mg | ORAL_CAPSULE | Freq: Two times a day (BID) | ORAL | Status: DC | PRN
  Administered 2015-10-23: 200 mg via ORAL
  Filled 2015-10-23: qty 2

## 2015-10-23 MED ORDER — OXYCODONE-ACETAMINOPHEN 5-325 MG PO TABS
1.0000 | ORAL_TABLET | ORAL | Status: DC | PRN
  Administered 2015-10-23 – 2015-10-24 (×6): 1 via ORAL
  Filled 2015-10-23 (×6): qty 1

## 2015-10-23 MED ORDER — OXYCODONE HCL 5 MG PO TABS
5.0000 mg | ORAL_TABLET | ORAL | Status: DC | PRN
Start: 2015-10-23 — End: 2015-10-24
  Administered 2015-10-23 – 2015-10-24 (×6): 5 mg via ORAL
  Filled 2015-10-23 (×6): qty 1

## 2015-10-23 NOTE — Progress Notes (Signed)
0152 Patient c/o dry, hacking cough & requesting cough medication. MD notified.

## 2015-10-23 NOTE — Progress Notes (Signed)
PROGRESS NOTE    Theresa Pace  SWN:[id number] DOB: 05-22-1961 DOA: 10/21/2015 PCP: Gar Ponto, MD   Brief Narrative:  55 y.o. female with medical history significant of COPD who comes in with progressive shortness of breath. Patient reports that approximately 2-3 days ago she had onset of sore throat, congestion, upper respiratory tract type symptoms. In the ED, Chest x-ray was found to be unremarkable. The patient was treated with intravenous steroids and nebulizer treatments with some improvement of her symptoms. She's been referred for management of COPD exacerbation   Assessment & Plan:   Active Problems:   COPD exacerbation (HCC)   Acute respiratory failure with hypoxia (HCC)   Acute exacerbation of chronic obstructive pulmonary disease (COPD) (HCC)   Anxiety   GERD (gastroesophageal reflux disease)   Chronic pain   Hyperlipidemia   Sinus tachycardia ([place])   1. COPD exacerbation, still short of breath and some wheeze. Likely precipitated by an upper respiratory tract infection. Continue IV steroids, nebulizer treatments, and azithromycin course. Continue pulmonary hygiene. 2. Acute respiratory failure with hypoxia. Likely related to #1.  Continue to wean down oxygen as tolerated. 3. Leukocytosis. Possibly related to IV steroids.  4. Hyperglycemia. Likely related to steroids. Blood glucose 194. Continue to monitor.  5. GERD. Continue PPI 6. Anxiety. Continue home dose of Xanax. 7. Hyperlipidemia. Continue statin 8. Chronic pain syndrome. Continue home dose of Percocet. 9. Sinus tachycardia. Improved. Likely reactive to underlying respiratory issues since she is still short of breath and wheezing. Review of telemetry indicate bursts of SVT. Continue low dose cardizem and monitor on telemetry.   DVT prophylaxis: Lovenox Code Status: Full Family Communication: No family bedside. Discussed with patient. Disposition Plan: discharge home, likely in AM if breathing continues to  improve   Consultants:   None  Procedures:   None  Antimicrobials:   Zithromax 5/31>>   Subjective: Feels slightly improved since yesterday. She is able to get up and move around. Reports productive cough and some wheeze.  Objective: Filed Vitals:   10/22/15 2303 10/23/15 0421 10/23/15 0500 10/23/15 0728  BP:   155/83   Pulse:   77   Temp:   97.5 F (36.4 C)   TempSrc:   Oral   Resp:   18   Height:      Weight:      SpO2: 95% 93% 95% 95%    Intake/Output Summary (Last 24 hours) at 10/23/15 0737 Last data filed at 10/22/15 1731  Gross per 24 hour  Intake    720 ml  Output      0 ml  Net    720 ml   Filed Weights   10/21/15 1056  Weight: 72.576 kg (160 lb)   Examination:  General exam: Appears calm and comfortable  Respiratory system:  Respiratory effort normal. Mild, wheeze bilaterally, fair air entry  Cardiovascular system: S1 & S2 heard, RRR. No JVD, murmurs, rubs, gallops or clicks. No pedal edema. Gastrointestinal system: Abdomen is nondistended, soft and nontender. No organomegaly or masses felt. Normal bowel sounds heard. Central nervous system: Alert and oriented. No focal neurological deficits. Extremities: Symmetric 5 x 5 power. Skin: No rashes, lesions or ulcers Psychiatry: Judgement and insight appear normal. Mood & affect appropriate.   Data Reviewed: I have personally reviewed following labs and imaging studies  CBC:  Recent Labs Lab 10/21/15 1124 10/22/15 0611 10/23/15 0514  WBC 7.0 11.0* 16.4*  NEUTROABS 4.3  --   --   HGB 14.8  15.1* 15.5*  HCT 45.6 46.6* 46.7*  MCV 91.9 92.6 91.0  PLT 236 265 756   Basic Metabolic Panel:  Recent Labs Lab 10/21/15 1124 10/22/15 0611 10/23/15 0514  NA 137 136 136  K 3.6 4.7 3.8  CL 103 103 101  CO2 '25 25 24  '$ GLUCOSE 111* 184* 194*  BUN 17 19 22*  CREATININE 0.61 0.72 0.76  CALCIUM 8.6* 9.1 9.2   Cardiac Enzymes:  Recent Labs Lab 10/21/15 1124  TROPONINI <0.03   Urine analysis:     Component Value Date/Time   COLORURINE YELLOW 02/21/2012 0055   APPEAR[place]UR CLEAR 02/21/2012 0055   LABSPEC <1.005* 02/21/2012 0055   PHURINE 6.0 02/21/2012 0055   GLUCOSEU NEGATIVE 02/21/2012 0055   HGBUR SMALL* 02/21/2012 0055   BILIRUBINUR NEGATIVE 02/21/2012 0055   KETONESUR NEGATIVE 02/21/2012 0055   PROTEINUR NEGATIVE 02/21/2012 0055   UROBILINOGEN 0.2 02/21/2012 0055   NITRITE NEGATIVE 02/21/2012 0055   LEUKOCYTESUR NEGATIVE 02/21/2012 0055   Sepsis Labs: '@LABRCNTIP'$ (procalcitonin:4,lacticidven:4)  )No results found for this or any previous visit (from the past 240 hour(s)).    Radiology Studies: Dg Chest 2 View  10/21/2015  CLINICAL DATA:  Shortness of breath and cough for several days EXAM: CHEST  2 VIEW COMPARISON:  February 16, 2015 FINDINGS: There is no edema or consolidation. Heart size and pulmonary vascularity are normal. No adenopathy. No bone lesions. IMPRESSION: No edema or consolidation. Electronically Signed   By: Lowella Grip III M.D.   On: 10/21/2015 12:00    Scheduled Meds: . amphetamine-dextroamphetamine  90 mg Oral BH-q7a  . aspirin EC  81 mg Oral Daily  . azithromycin  250 mg Oral Daily  . diltiazem  30 mg Oral Q8H  . enoxaparin (LOVENOX) injection  40 mg Subcutaneous Q24H  . guaiFENesin  1,200 mg Oral BID  . ipratropium  0.5 mg Nebulization Q4H  . levalbuterol  0.63 mg Nebulization Q4H  . methylPREDNISolone (SOLU-MEDROL) injection  60 mg Intravenous Q6H  . mirabegron ER  50 mg Oral Daily  . mometasone-formoterol  2 puff Inhalation BID  . montelukast  10 mg Oral QHS  . pantoprazole  40 mg Oral Daily  . rosuvastatin  20 mg Oral q1800  . sodium chloride flush  3 mL Intravenous Q12H  . traZODone  150 mg Oral QHS   Continuous Infusions:    LOS: 2 days    Time spent: 20 minutes    Kathie Dike, MD  [facility] Pager [phone]  If 7PM-7AM, please contact night-coverage [email or web address] Password [email or web address] 10/23/2015, 7:37 AM     By signing my name below, I, Delene Ruffini, attest that this documentation has been prepared under the direction and in the presence of Kathie Dike, MD. Electronically Signed: Delene Ruffini 10/23/2015 10:15am  I, Dr. Kathie Dike, personally performed the services described in this documentaiton. All medical record entries made by the scribe were at my direction and in my presence. I have reviewed the chart and agree that the record reflects my personal performance and is accurate and complete  Kathie Dike, MD, 10/23/2015 12:28 PM

## 2015-10-23 NOTE — Progress Notes (Signed)
SATURATION QUALIFICATIONS: (This note is used to comply with regulatory documentation for home oxygen)  Patient Saturations on Room Air at Rest = 95%  Patient Saturations on Room Air while Ambulating = 93%  Patient Saturations on 2 Liters of oxygen while Ambulating = 97%  Please briefly explain why patient needs home oxygen:

## 2015-10-24 MED ORDER — GUAIFENESIN ER 600 MG PO TB12: 600.0000 mg | ORAL_TABLET | Freq: Two times a day (BID) | ORAL | Status: DC

## 2015-10-24 MED ORDER — DILTIAZEM HCL 30 MG PO TABS: 30.0000 mg | ORAL_TABLET | Freq: Three times a day (TID) | ORAL | Status: DC

## 2015-10-24 MED ORDER — AZITHROMYCIN 250 MG PO TABS: 250.0000 mg | ORAL_TABLET | Freq: Every day | ORAL | Status: DC

## 2015-10-24 MED ORDER — BENZONATATE 200 MG PO CAPS: 200.0000 mg | ORAL_CAPSULE | Freq: Three times a day (TID) | ORAL | Status: DC | PRN

## 2015-10-24 MED ORDER — PREDNISONE 10 MG PO TABS: ORAL_TABLET | ORAL | Status: DC

## 2015-10-24 NOTE — Progress Notes (Signed)
Patient states understanding of discharge instructions, prescriptions given 

## 2015-10-24 NOTE — Progress Notes (Signed)
Patient's 02 saturation on ra at rest is 93%, with ambulation  On ra 94% and after ambulation at rest 94%.

## 2015-10-24 NOTE — Discharge Summary (Signed)
Physician Discharge Summary  Theresa Pace:[id number] DOB: 10-19-1960 DOA: 10/21/2015  PCP: Gar Ponto, MD  Admit date: 10/21/2015 Discharge date: 10/24/2015  Time spent: 35 minutes  Recommendations for Outpatient Follow-up:  1. Follow up with PCP in 1-2 weeks 2. Discharge with steroids, nebs, and antibiotics. 3. She will need to establish care with a primary pulmonologist. She will be given information for Dr. Luan Pulling.   Discharge Diagnoses:  Active Problems:   COPD exacerbation (HCC)   Acute respiratory failure with hypoxia (HCC)   Acute exacerbation of chronic obstructive pulmonary disease (COPD) (HCC)   Anxiety   GERD (gastroesophageal reflux disease)   Chronic pain   Hyperlipidemia   Sinus tachycardia ([place])   Discharge Condition: improved  Diet recommendation: heart healthy  Filed Weights   10/21/15 1056  Weight: 72.576 kg (160 lb)    History of present illness:  Theresa Pace is a 55 y.o. female with medical history significant of COPD who came in with progressive shortness of breath. Patient reported that approximately 2-3 days prior to admission, she had onset of sore throat, congestion, upper respiratory tract type symptoms. She reported that her granddaughter recently had similar symptoms and thought that she may have caught an infection from her. As her symptoms progressed, she began having shortness of breath, wheezing, cough productive of green colored sputum. She denied any fever or chest pain. She had not had any vomiting, dysuria or diarrhea. Since her shortness of breath failed to improve with nebulizer treatments, she presented to the ER for evaluation.  Hospital Course:  1. COPD exacerbation, Patient was treated with supplemental oxygen, nebulizer treatments, intravenous steroids and azithromycin. She was started on pulmonary hygiene. Poor hospital stay, her respiratory status has improved. Wheezing has resolved. She continues to cough. She is no longer  requiring oxygen and is maintaining oxygen saturations greater than 90% on room air at rest and on ambulation. She's been transitioned to prednisone taper. Will continue nebulizer treatments at home. 2. Acute respiratory failure with hypoxia. Likely related to #1. She has been weaned off of oxygen 3. Leukocytosis. related to IV steroids. No evidence of ongoing infection 4. GERD. Continue PPI 5. Anxiety. Continued on home dose of Xanax. 6. Hyperlipidemia. Continued on statin 7. Chronic pain syndrome. Continued on home dose of Percocet. 8. Sinus tachycardia. Improved. Likely reactive to underlying respiratory issues since she is still short of breath and wheezing. Review of telemetry indicated bursts of SVT. She was started on low-dose Cardizem with significant improvement of her heart rate. Will continue the same.  Procedures:  none  Consultations:  none  Discharge Exam: Filed Vitals:   10/23/15 2232 10/24/15 0517  BP: 152/84 161/89  Pulse: 82 85  Temp: 98 F (36.7 C) 97.4 F (36.3 C)  Resp: 20 20    Examination:  General exam: Appears calm and comfortable  Respiratory system: Clear to auscultation. Respiratory effort normal. Cardiovascular system: S1 & S2 heard, RRR. No JVD, murmurs, rubs, gallops or clicks. No pedal edema.  Gastrointestinal system: Abdomen is nondistended, soft and nontender. No organomegaly or masses felt. Normal bowel sounds heard. Central nervous system: Alert and oriented. No focal neurological deficits. Extremities: Symmetric 5 x 5 power. Skin: No rashes, lesions or ulcers Psychiatry: Judgement and insight appear normal. Mood & affect appropriate.   Discharge Instructions  Discharge Instructions    Diet - low sodium heart healthy    Complete by:  As directed      Face-to-face encounter (required for  Medicare/Medicaid patients)    Complete by:  As directed   I Nicolaus Andel certify that this patient is under my care and that I, or a nurse  practitioner or physician's assistant working with me, had a face-to-face encounter that meets the physician face-to-face encounter requirements with this patient on 10/24/2015. The encounter with the patient was in whole, or in part for the following medical condition(s) which is the primary reason for home health care (List medical condition): copd exacerbation  The encounter with the patient was in whole, or in part, for the following medical condition, which is the primary reason for home health care:  copd exacerbation  I certify that, based on my findings, the following services are medically necessary home health services:  Nursing  Reason for Medically Necessary Home Health Services:  Skilled Nursing- Teaching of Disease Process/Symptom Management  My clinical findings support the need for the above services:  Shortness of breath with activity  Further, I certify that my clinical findings support that this patient is homebound due to:  Shortness of Breath with activity     For home use only DME Nebulizer machine    Complete by:  As directed   Portable nebulizer     Home Health    Complete by:  As directed   To provide the following care/treatments:  RN     Increase activity slowly    Complete by:  As directed           Current Discharge Medication List    START taking these medications   Details  azithromycin (ZITHROMAX) 250 MG tablet Take 1 tablet (250 mg total) by mouth daily. Qty: 2 each, Refills: 0    benzonatate (TESSALON) 200 MG capsule Take 1 capsule (200 mg total) by mouth 3 (three) times daily as needed for cough. Qty: 20 capsule, Refills: 0    diltiazem (CARDIZEM) 30 MG tablet Take 1 tablet (30 mg total) by mouth 3 (three) times daily. Qty: 90 tablet, Refills: 1    guaiFENesin (MUCINEX) 600 MG 12 hr tablet Take 1 tablet (600 mg total) by mouth 2 (two) times daily. Qty: 30 tablet, Refills: 0      CONTINUE these medications which have CHANGED   Details  predniSONE  (DELTASONE) 10 MG tablet Take 40 mg po daily for 2 days then '30mg'$  daily for 2 days then '20mg'$  daily for 2 days then '10mg'$  daily for 2 days then stop Qty: 20 tablet, Refills: 0      CONTINUE these medications which have NOT CHANGED   Details  acetaminophen (TYLENOL) 500 MG tablet Take 1,000-1,500 mg by mouth every 6 (six) hours as needed for moderate pain.    albuterol (PROVENTIL HFA;VENTOLIN HFA) 108 (90 BASE) MCG/ACT inhaler Inhale 2 puffs into the lungs every 6 (six) hours as needed.    ALPRAZolam (XANAX) 1 MG tablet Take 1 mg by mouth 3 (three) times daily.    amphetamine-dextroamphetamine (ADDERALL XR) 30 MG 24 hr capsule Take 90 mg by mouth every morning.    aspirin EC 81 MG tablet Take 81 mg by mouth daily.    ciclopirox (PENLAC) 8 % solution Apply 1 application topically at bedtime. Apply over nail and surrounding skin. Apply daily over previous coat. After seven (7) days, may remove with alcohol and continue cycle.    fluticasone-salmeterol (ADVAIR HFA) 230-21 MCG/ACT inhaler Inhale 2 puffs into the lungs 2 (two) times daily.    halobetasol (ULTRAVATE) 0.05 % cream Apply 1 application topically  2 (two) times daily. eczema    ipratropium-albuterol (DUONEB) 0.5-2.5 (3) MG/3ML SOLN Take 3 mLs by nebulization 4 (four) times daily.    Loperamide HCl (ANTI-DIARRHEAL PO) Take 2 capsules by mouth daily as needed (constipation).    loratadine (CLARITIN) 10 MG tablet Take 10 mg by mouth daily as needed for allergies.     mirabegron ER (MYRBETRIQ) 50 MG TB24 tablet Take 50 mg by mouth daily.    montelukast (SINGULAIR) 10 MG tablet Take 10 mg by mouth at bedtime.    naphazoline (NAPHCON) 0.1 % ophthalmic solution Place 1 drop into both eyes 4 (four) times daily as needed for irritation or allergies.     omeprazole (PRILOSEC) 20 MG capsule Take 20 mg by mouth daily.    oxyCODONE-acetaminophen (PERCOCET) 10-325 MG per tablet Take 1 tablet by mouth 3 (three) times daily.    rosuvastatin  (CRESTOR) 20 MG tablet Take 20 mg by mouth daily.    traZODone (DESYREL) 50 MG tablet Take 150 mg by mouth at bedtime.        Allergies  Allergen Reactions  . Methadone Hives  . Aspirin Other (See Comments)    Stomach Pain   Follow-up Information    Follow up with HAWKINS,EDWARD L, MD.   Specialty:  Pulmonary Disease   Why:  call for appointment   Contact information:   [place] [address] [phone]       Follow up with Gar Ponto, MD. Schedule an appointment as soon as possible for a visit in 2 weeks.   Specialty:  Family Medicine   Contact information:   [place] [address] [phone]        The results of significant diagnostics from this hospitalization (including imaging, microbiology, ancillary and laboratory) are listed below for reference.    Significant Diagnostic Studies: Dg Chest 2 View  10/21/2015  CLINICAL DATA:  Shortness of breath and cough for several days EXAM: CHEST  2 VIEW COMPARISON:  February 16, 2015 FINDINGS: There is no edema or consolidation. Heart size and pulmonary vascularity are normal. No adenopathy. No bone lesions. IMPRESSION: No edema or consolidation. Electronically Signed   By: Lowella Grip III M.D.   On: 10/21/2015 12:00    Microbiology: No results found for this or any previous visit (from the past 240 hour(s)).   Labs: Basic Metabolic Panel:  Recent Labs Lab 10/21/15 1124 10/22/15 0611 10/23/15 0514  NA 137 136 136  K 3.6 4.7 3.8  CL 103 103 101  CO2 '25 25 24  '$ GLUCOSE 111* 184* 194*  BUN 17 19 22*  CREATININE 0.61 0.72 0.76  CALCIUM 8.6* 9.1 9.2   CBC:  Recent Labs Lab 10/21/15 1124 10/22/15 0611 10/23/15 0514  WBC 7.0 11.0* 16.4*  NEUTROABS 4.3  --   --   HGB 14.8 15.1* 15.5*  HCT 45.6 46.6* 46.7*  MCV 91.9 92.6 91.0  PLT 236 265 283   Cardiac Enzymes:  Recent Labs Lab 10/21/15 1124  TROPONINI <0.03   BNP: BNP (last 3 results)  Recent  Labs  02/16/15 1545  BNP 89.0    Signed:  Kathie Dike, MD.  [facility] 10/24/2015, 1:38 PM  By signing my name below, I, Delene Ruffini, attest that this documentation has been prepared under the direction and in the presence of Kathie Dike, MD. Electronically Signed: Delene Ruffini 10/24/2015 10:52am  I, Dr. Kathie Dike, personally performed the services described in this documentaiton.  All medical record entries made by the scribe were at my direction and in my presence. I have reviewed the chart and agree that the record reflects my personal performance and is accurate and complete  Kathie Dike, MD, 10/24/2015 1:38 PM

## 2016-06-21 ENCOUNTER — Emergency Department (HOSPITAL_COMMUNITY)
Admission: EM | Admit: 2016-06-21 | Discharge: 2016-06-22 | Disposition: A | Discharge status: Home / Self Care | Payer: Medicaid Other | Attending: Emergency Medicine | Admitting: Emergency Medicine

## 2016-06-21 ENCOUNTER — Emergency Department (HOSPITAL_COMMUNITY): Payer: Medicaid Other

## 2016-06-21 ENCOUNTER — Encounter (HOSPITAL_COMMUNITY): Payer: Self-pay | Admitting: Emergency Medicine

## 2016-06-21 DIAGNOSIS — Z79899 Other long term (current) drug therapy: Secondary | ICD-10-CM | POA: Insufficient documentation

## 2016-06-21 DIAGNOSIS — J45909 Unspecified asthma, uncomplicated: Secondary | ICD-10-CM | POA: Diagnosis not present

## 2016-06-21 DIAGNOSIS — J189 Pneumonia, unspecified organism: Secondary | ICD-10-CM | POA: Insufficient documentation

## 2016-06-21 DIAGNOSIS — F17210 Nicotine dependence, cigarettes, uncomplicated: Secondary | ICD-10-CM | POA: Insufficient documentation

## 2016-06-21 DIAGNOSIS — Z7982 Long term (current) use of aspirin: Secondary | ICD-10-CM | POA: Insufficient documentation

## 2016-06-21 DIAGNOSIS — R0602 Shortness of breath: Secondary | ICD-10-CM | POA: Diagnosis present

## 2016-06-21 HISTORY — DX: Fibromyalgia: M79.7

## 2016-06-21 HISTORY — DX: Enthesopathy, unspecified: M77.9

## 2016-06-21 HISTORY — DX: Unspecified osteoarthritis, unspecified site: M19.90

## 2016-06-21 LAB — BASIC METABOLIC PANEL
Anion gap: 11 (ref 5–15)
BUN: 13 mg/dL (ref 6–20)
CO2: 25 mmol/L (ref 22–32)
Calcium: 9.3 mg/dL (ref 8.9–10.3)
Chloride: 102 mmol/L (ref 101–111)
Creatinine, Ser: 0.86 mg/dL (ref 0.44–1.00)
GFR calc Af Amer: >60 mL/min (ref >60)
GFR calc non Af Amer: >60 mL/min (ref >60)
GLUCOSE: 108 mg/dL — AB (ref 65–99)
Potassium: 3.8 mmol/L (ref 3.5–5.1)
Sodium: 138 mmol/L (ref 135–145)

## 2016-06-21 LAB — CBC WITH DIFFERENTIAL/PLATELET
Basophils Absolute: 0 10*3/uL (ref 0.0–0.1)
Basophils Relative: 0 %
EOS PCT: 1 %
Eosinophils Absolute: 0.2 10*3/uL (ref 0.0–0.7)
HCT: 47.6 % — ABNORMAL HIGH (ref 36.0–46.0)
Hemoglobin: 16.3 g/dL — ABNORMAL HIGH (ref 12.0–15.0)
LYMPHS ABS: 2.7 10*3/uL (ref 0.7–4.0)
LYMPHS PCT: 21 %
MCH: 30.4 pg (ref 26.0–34.0)
MCHC: 34.2 g/dL (ref 30.0–36.0)
MCV: 88.8 fL (ref 78.0–100.0)
MONO ABS: 0.5 10*3/uL (ref 0.1–1.0)
Monocytes Relative: 4 %
Neutro Abs: 9.4 10*3/uL — ABNORMAL HIGH (ref 1.7–7.7)
Neutrophils Relative %: 74 %
PLATELETS: 278 10*3/uL (ref 150–400)
RBC: 5.36 MIL/uL — ABNORMAL HIGH (ref 3.87–5.11)
RDW: 13.7 % (ref 11.5–15.5)
WBC: 12.8 10*3/uL — ABNORMAL HIGH (ref 4.0–10.5)

## 2016-06-21 LAB — LACTIC ACID, PLASMA: LACTIC ACID, VENOUS: 0.8 mmol/L (ref 0.5–1.9)

## 2016-06-21 LAB — TROPONIN I

## 2016-06-21 MED ORDER — DEXTROSE 5 % IV SOLN
500.0000 mg | INTRAVENOUS | Status: DC
  Administered 2016-06-21: 500 mg via INTRAVENOUS
  Filled 2016-06-21: qty 500

## 2016-06-21 MED ORDER — DEXTROSE 5 % IV SOLN
1.0000 g | Freq: Once | INTRAVENOUS | Status: AC
  Administered 2016-06-21: 1 g via INTRAVENOUS
  Filled 2016-06-21: qty 10

## 2016-06-21 MED ORDER — SODIUM CHLORIDE 0.9 % IV BOLUS (SEPSIS)
1000.0000 mL | Freq: Once | INTRAVENOUS | Status: AC
  Administered 2016-06-21: 1000 mL via INTRAVENOUS

## 2016-06-21 NOTE — ED Notes (Signed)
Pt and family made aware of the wait time. Pt and family had no complaints at this time.

## 2016-06-21 NOTE — ED Triage Notes (Signed)
Patient complaining of shortness of breath and cough x 1 month. States she was seen by PCP on Saturday and they did chest x-ray and treated for flu. States they called today and told her she had pneumonia and to go to ER for treatment.

## 2016-06-22 MED ORDER — IPRATROPIUM-ALBUTEROL 0.5-2.5 (3) MG/3ML IN SOLN
3.0000 mL | Freq: Once | RESPIRATORY_TRACT | Status: AC
  Administered 2016-06-22: 3 mL via RESPIRATORY_TRACT
  Filled 2016-06-22: qty 3

## 2016-06-22 MED ORDER — AZITHROMYCIN 250 MG PO TABS: ORAL_TABLET | ORAL | 0 refills | Status: DC

## 2016-06-22 MED ORDER — ALBUTEROL SULFATE (2.5 MG/3ML) 0.083% IN NEBU
2.5000 mg | INHALATION_SOLUTION | Freq: Once | RESPIRATORY_TRACT | Status: AC
  Administered 2016-06-22: 2.5 mg via RESPIRATORY_TRACT
  Filled 2016-06-22: qty 3

## 2016-06-22 MED ORDER — PREDNISONE 50 MG PO TABS
60.0000 mg | ORAL_TABLET | Freq: Once | ORAL | Status: AC
  Administered 2016-06-22: 02:00:00 60 mg via ORAL
  Filled 2016-06-22: qty 1

## 2016-06-22 MED ORDER — OXYCODONE-ACETAMINOPHEN 5-325 MG PO TABS
2.0000 | ORAL_TABLET | Freq: Once | ORAL | Status: AC
  Administered 2016-06-22: 2 via ORAL
  Filled 2016-06-22: qty 2

## 2016-06-22 MED ORDER — AMOXICILLIN-POT CLAVULANATE ER 1000-62.5 MG PO TB12: 2.0000 | ORAL_TABLET | Freq: Two times a day (BID) | ORAL | 0 refills | Status: DC

## 2016-06-22 MED ORDER — PROMETHAZINE-CODEINE 6.25-10 MG/5ML PO SYRP: 5.0000 mL | ORAL_SOLUTION | ORAL | 0 refills | Status: DC | PRN

## 2016-06-22 MED ORDER — PREDNISONE 50 MG PO TABS: ORAL_TABLET | ORAL | 0 refills | Status: DC

## 2016-06-22 NOTE — Discharge Instructions (Signed)
Take your next dose of prednisone and zithromax tonight with your dinner (Wednesday).  Start the augmentin antibiotic in the morning  and stop taking the doxycycline.  Do finish your tamiflu you were prescribed on Saturday.  Use your nebulizer every 4 hours if you are wheezing or short of breath.  Plan a followup with your doctor within the week but return here for any worsening symptoms.

## 2016-06-22 NOTE — ED Notes (Signed)
Ambulated pt to bathroom. Pt's ox sat remained at 98% during ambulation.

## 2016-06-22 NOTE — ED Provider Notes (Signed)
[name] DEPT Provider Note   CSN: [id number] Arrival date & time: 06/21/16  1855     History   Chief Complaint Chief Complaint  Patient presents with  . Shortness of Breath    HPI Theresa Pace is a 56 y.o. female with a past medical history as outlined below, most pertinent for COPD presenting with flu like symptoms and now associated with wheezing and shortness of breath , reports becomes short of breath walking to her bathroom. She was seen by her pcp 4 days ago at which time her symptoms was most consistent with influenza.  She was started on tamiflu and also started on doxycycline, although she was not notified until today that her CXR performed was positive for pneumonia.  She was advised to present here for further treatment of her pneumonia. She denies chest pain, abdominal pain, nausea, vomiting, but does endorse generalized weakness and fatigue.  She continues to smoke but is trying to quit.   HPI  Past Medical History:  Diagnosis Date  . Arthritis   . Asthma   . COPD (chronic obstructive pulmonary disease) ([place])   . Depression   . Fibromyalgia   . Frequent urination at night   . GERD (gastroesophageal reflux disease)   . Hypercholesteremia   . IBS (irritable bowel syndrome)   . Tendonitis     Patient Active Problem List   Diagnosis Date Noted  . Sinus tachycardia 10/22/2015  . Acute exacerbation of chronic obstructive pulmonary disease (COPD) ([place]) 10/21/2015  . Anxiety 10/21/2015  . GERD (gastroesophageal reflux disease) 10/21/2015  . Chronic pain 10/21/2015  . Hyperlipidemia 10/21/2015  . Acute respiratory failure with hypoxia ([place]) 02/18/2015  . COPD (chronic obstructive pulmonary disease) ([place]) 02/16/2015  . COPD exacerbation ([place]) 02/16/2015  . Mycoplasma pneumonia 02/16/2015  . Seizure ([place]) 02/22/2012  . Bleeding hemorrhoid 02/22/2012  . Acute encephalopathy 02/21/2012  . Asthma 02/21/2012  . Depression 02/21/2012    Past Surgical History:   Procedure Laterality Date  . ABDOMINAL HYSTERECTOMY    . APPENDECTOMY      OB History    No data available       Home Medications    Prior to Admission medications   Medication Sig Start Date End Date Taking? Authorizing Provider  acetaminophen (TYLENOL) 500 MG tablet Take 1,000-1,500 mg by mouth every 6 (six) hours as needed for moderate pain.    Historical Provider, MD  albuterol (PROVENTIL HFA;VENTOLIN HFA) 108 (90 BASE) MCG/ACT inhaler Inhale 2 puffs into the lungs every 6 (six) hours as needed.    Historical Provider, MD  ALPRAZolam Duanne Moron) 1 MG tablet Take 1 mg by mouth 3 (three) times daily.    Historical Provider, MD  amoxicillin-clavulanate (AUGMENTIN XR) 1000-62.5 MG 12 hr tablet Take 2 tablets by mouth 2 (two) times daily. 06/22/16   Evalee Jefferson, PA-C  amphetamine-dextroamphetamine (ADDERALL XR) 30 MG 24 hr capsule Take 90 mg by mouth every morning.    Historical Provider, MD  aspirin EC 81 MG tablet Take 81 mg by mouth daily.    Historical Provider, MD  azithromycin (ZITHROMAX) 250 MG tablet Take one tablet daily by mouth for 4 days. 06/22/16   Evalee Jefferson, PA-C  benzonatate (TESSALON) 200 MG capsule Take 1 capsule (200 mg total) by mouth 3 (three) times daily as needed for cough. 10/24/15   Kathie Dike, MD  ciclopirox (PENLAC) 8 % solution Apply 1 application topically at bedtime. Apply over nail and surrounding skin. Apply daily over previous  coat. After seven (7) days, may remove with alcohol and continue cycle.    Historical Provider, MD  diltiazem (CARDIZEM) 30 MG tablet Take 1 tablet (30 mg total) by mouth 3 (three) times daily. 10/24/15   Kathie Dike, MD  fluticasone-salmeterol (ADVAIR HFA) 230-21 MCG/ACT inhaler Inhale 2 puffs into the lungs 2 (two) times daily.    Historical Provider, MD  guaiFENesin (MUCINEX) 600 MG 12 hr tablet Take 1 tablet (600 mg total) by mouth 2 (two) times daily. 10/24/15   Kathie Dike, MD  halobetasol (ULTRAVATE) 0.05 % cream Apply 1  application topically 2 (two) times daily. eczema    Historical Provider, MD  ipratropium-albuterol (DUONEB) 0.5-2.5 (3) MG/3ML SOLN Take 3 mLs by nebulization 4 (four) times daily. 02/20/15   Samuella Cota, MD  Loperamide HCl (ANTI-DIARRHEAL PO) Take 2 capsules by mouth daily as needed (constipation).    Historical Provider, MD  loratadine (CLARITIN) 10 MG tablet Take 10 mg by mouth daily as needed for allergies.     Historical Provider, MD  mirabegron ER (MYRBETRIQ) 50 MG TB24 tablet Take 50 mg by mouth daily.    Historical Provider, MD  montelukast (SINGULAIR) 10 MG tablet Take 10 mg by mouth at bedtime.    Historical Provider, MD  naphazoline (NAPHCON) 0.1 % ophthalmic solution Place 1 drop into both eyes 4 (four) times daily as needed for irritation or allergies.     Historical Provider, MD  omeprazole (PRILOSEC) 20 MG capsule Take 20 mg by mouth daily.    Historical Provider, MD  oxyCODONE-acetaminophen (PERCOCET) 10-325 MG per tablet Take 1 tablet by mouth 3 (three) times daily.    Historical Provider, MD  predniSONE (DELTASONE) 50 MG tablet Take one tablet by mouth daily for 4 days. 06/23/16   Evalee Jefferson, PA-C  promethazine-codeine (PHENERGAN WITH CODEINE) 6.25-10 MG/5ML syrup Take 5 mLs by mouth every 4 (four) hours as needed for cough. 06/22/16   Evalee Jefferson, PA-C  rosuvastatin (CRESTOR) 20 MG tablet Take 20 mg by mouth daily.    Historical Provider, MD  traZODone (DESYREL) 50 MG tablet Take 150 mg by mouth at bedtime.     Historical Provider, MD    Family History History reviewed. No pertinent family history.  Social History Social History  Substance Use Topics  . Smoking status: Current Every Day Smoker    Packs/day: 0.25    Types: Cigarettes  . Smokeless tobacco: Never Used  . Alcohol use No     Allergies   Methadone and Aspirin   Review of Systems Review of Systems  Constitutional: Positive for fatigue and fever.  HENT: Negative for congestion and sore throat.     Eyes: Negative.   Respiratory: Positive for cough, shortness of breath and wheezing. Negative for chest tightness.   Cardiovascular: Negative for chest pain.  Gastrointestinal: Negative for abdominal pain, nausea and vomiting.  Genitourinary: Negative.   Musculoskeletal: Negative for arthralgias, joint swelling and neck pain.  Skin: Negative.  Negative for rash and wound.  Neurological: Positive for weakness. Negative for dizziness, light-headedness, numbness and headaches.  Psychiatric/Behavioral: Negative.      Physical Exam Updated Vital Signs BP 155/95 (BP Location: Left Arm)   Pulse 102   Temp 98.7 F (37.1 C) (Oral)   Resp 20   Ht '5\' 2"'$  (1.575 m)   Wt 76.7 kg   SpO2 94%   BMI 30.91 kg/m   Physical Exam  Constitutional: She appears well-developed and well-nourished.  HENT:  Head: Normocephalic  and atraumatic.  Eyes: Conjunctivae are normal.  Neck: Normal range of motion.  Cardiovascular: Normal rate, regular rhythm, normal heart sounds and intact distal pulses.   Pulmonary/Chest: Effort normal. No respiratory distress. She has decreased breath sounds. She has wheezes. She has rhonchi.  Abdominal: Soft. Bowel sounds are normal. There is no tenderness.  Musculoskeletal: Normal range of motion.  Neurological: She is alert.  Skin: Skin is warm and dry.  Psychiatric: She has a normal mood and affect.  Nursing note and vitals reviewed.    ED Treatments / Results  Labs (all labs ordered are listed, but only abnormal results are displayed) Labs Reviewed  CBC WITH DIFFERENTIAL/PLATELET - Abnormal; Notable for the following:       Result Value   WBC 12.8 (*)    RBC 5.36 (*)    Hemoglobin 16.3 (*)    HCT 47.6 (*)    Neutro Abs 9.4 (*)    All other components within normal limits  BASIC METABOLIC PANEL - Abnormal; Notable for the following:    Glucose, Bld 108 (*)    All other components within normal limits  TROPONIN I  LACTIC ACID, PLASMA    EKG  EKG  Interpretation None       Radiology Dg Chest 2 View  Result Date: 06/21/2016 CLINICAL DATA:  Dyspnea and cough for 3 days. Recent influenza diagnosis. EXAM: CHEST  2 VIEW COMPARISON:  06/18/2016 FINDINGS: Streaky opacities in the lingula appear less confluent compared to the prior examination. No confluent airspace consolidation. No effusions. Normal pulmonary vasculature. Hilar and mediastinal contours are unremarkable and unchanged. IMPRESSION: Mild streaky lingular opacities. No confluent consolidation or effusion. Electronically Signed   By: Andreas Newport M.D.   On: 06/21/2016 19:36    Procedures Procedures (including critical care time)  Medications Ordered in ED Medications  azithromycin (ZITHROMAX) 500 mg in dextrose 5 % 250 mL IVPB (0 mg Intravenous Stopped 06/22/16 0052)  sodium chloride 0.9 % bolus 1,000 mL (0 mLs Intravenous Stopped 06/22/16 0031)  cefTRIAXone (ROCEPHIN) 1 g in dextrose 5 % 50 mL IVPB (0 g Intravenous Stopped 06/21/16 2345)  oxyCODONE-acetaminophen (PERCOCET/ROXICET) 5-325 MG per tablet 2 tablet (2 tablets Oral Given 06/22/16 0036)  ipratropium-albuterol (DUONEB) 0.5-2.5 (3) MG/3ML nebulizer solution 3 mL (3 mLs Nebulization Given 06/22/16 0106)  albuterol (PROVENTIL) (2.5 MG/3ML) 0.083% nebulizer solution 2.5 mg (2.5 mg Nebulization Given 06/22/16 0106)  predniSONE (DELTASONE) tablet 60 mg (60 mg Oral Given 06/22/16 0147)     Initial Impression / Assessment and Plan / ED Course  I have reviewed the triage vital signs and the nursing notes.  Pertinent labs & imaging results that were available during my care of the patient were reviewed by me and considered in my medical decision making (see chart for details).    Pt wheezing much improved after neb tx.  Ambulated with pulse ox 98%. Port score low risk for morbidity/mortality.  Outpatient tx reasonable.  She was placed on augmentin and zithromax in place of doxycycline for better coverage given her COPD  status. Advised to complete her tamiflu (has 2 more doses).  Close f/u with pcp and return here for any worsening sx.  Neb tx q 4 hours, states has plenty of albuterol for her machine.  Prednisone pulse dosing, first dose given here.  The patient appears reasonably screened and/or stabilized for discharge and I doubt any other medical condition or other [facility] requiring further screening, evaluation, or treatment in the ED at this time  prior to discharge.   Final Clinical Impressions(s) / ED Diagnoses   Final diagnoses:  Community acquired pneumonia, unspecified laterality    New Prescriptions New Prescriptions   AMOXICILLIN-CLAVULANATE (AUGMENTIN XR) 1000-62.5 MG 12 HR TABLET    Take 2 tablets by mouth 2 (two) times daily.   AZITHROMYCIN (ZITHROMAX) 250 MG TABLET    Take one tablet daily by mouth for 4 days.   PREDNISONE (DELTASONE) 50 MG TABLET    Take one tablet by mouth daily for 4 days.   PROMETHAZINE-CODEINE (PHENERGAN WITH CODEINE) 6.25-10 MG/5ML SYRUP    Take 5 mLs by mouth every 4 (four) hours as needed for cough.     Evalee Jefferson, PA-C 06/22/16 Irma Newness, MD 06/22/16 [phone]

## 2016-06-27 ENCOUNTER — Encounter (HOSPITAL_COMMUNITY): Payer: Self-pay | Admitting: Emergency Medicine

## 2016-06-27 ENCOUNTER — Emergency Department (HOSPITAL_COMMUNITY)
Admission: EM | Admit: 2016-06-27 | Discharge: 2016-06-27 | Disposition: A | Discharge status: Home / Self Care | Payer: Medicaid Other | Attending: Emergency Medicine | Admitting: Emergency Medicine

## 2016-06-27 ENCOUNTER — Emergency Department (HOSPITAL_COMMUNITY): Payer: Medicaid Other

## 2016-06-27 DIAGNOSIS — Z79899 Other long term (current) drug therapy: Secondary | ICD-10-CM | POA: Insufficient documentation

## 2016-06-27 DIAGNOSIS — J45909 Unspecified asthma, uncomplicated: Secondary | ICD-10-CM | POA: Insufficient documentation

## 2016-06-27 DIAGNOSIS — F17210 Nicotine dependence, cigarettes, uncomplicated: Secondary | ICD-10-CM | POA: Insufficient documentation

## 2016-06-27 DIAGNOSIS — J441 Chronic obstructive pulmonary disease with (acute) exacerbation: Secondary | ICD-10-CM | POA: Insufficient documentation

## 2016-06-27 DIAGNOSIS — Z7982 Long term (current) use of aspirin: Secondary | ICD-10-CM | POA: Diagnosis not present

## 2016-06-27 DIAGNOSIS — R0602 Shortness of breath: Secondary | ICD-10-CM | POA: Diagnosis present

## 2016-06-27 LAB — COMPREHENSIVE METABOLIC PANEL
ALK PHOS: 83 U/L (ref 38–126)
ALT: 17 U/L (ref 14–54)
AST: 16 U/L (ref 15–41)
Albumin: 3.6 g/dL (ref 3.5–5.0)
Anion gap: 7 (ref 5–15)
BUN: 15 mg/dL (ref 6–20)
CALCIUM: 8.8 mg/dL — AB (ref 8.9–10.3)
CO2: 26 mmol/L (ref 22–32)
CREATININE: 0.77 mg/dL (ref 0.44–1.00)
Chloride: 103 mmol/L (ref 101–111)
GFR calc non Af Amer: >60 mL/min (ref >60)
GLUCOSE: 110 mg/dL — AB (ref 65–99)
Potassium: 4 mmol/L (ref 3.5–5.1)
SODIUM: 136 mmol/L (ref 135–145)
Total Bilirubin: 0.7 mg/dL (ref 0.3–1.2)
Total Protein: 6.2 g/dL — ABNORMAL LOW (ref 6.5–8.1)

## 2016-06-27 LAB — CBC WITH DIFFERENTIAL/PLATELET
BASOS PCT: 0 %
Basophils Absolute: 0 10*3/uL (ref 0.0–0.1)
EOS ABS: 0.1 10*3/uL (ref 0.0–0.7)
EOS PCT: 1 %
HCT: 44.3 % (ref 36.0–46.0)
Hemoglobin: 14.9 g/dL (ref 12.0–15.0)
Lymphocytes Relative: 25 %
Lymphs Abs: 2.6 10*3/uL (ref 0.7–4.0)
MCH: 30.5 pg (ref 26.0–34.0)
MCHC: 33.6 g/dL (ref 30.0–36.0)
MCV: 90.8 fL (ref 78.0–100.0)
MONOS PCT: 6 %
Monocytes Absolute: 0.6 10*3/uL (ref 0.1–1.0)
NEUTROS PCT: 68 %
Neutro Abs: 7 10*3/uL (ref 1.7–7.7)
PLATELETS: 275 10*3/uL (ref 150–400)
RBC: 4.88 MIL/uL (ref 3.87–5.11)
RDW: 14.1 % (ref 11.5–15.5)
WBC: 10.3 10*3/uL (ref 4.0–10.5)

## 2016-06-27 LAB — URINALYSIS, ROUTINE W REFLEX MICROSCOPIC
BILIRUBIN URINE: NEGATIVE
Glucose, UA: NEGATIVE mg/dL
HGB URINE DIPSTICK: NEGATIVE
Ketones, ur: NEGATIVE mg/dL
Leukocytes, UA: NEGATIVE
Nitrite: NEGATIVE
PROTEIN: NEGATIVE mg/dL
Specific Gravity, Urine: 1.01 (ref 1.005–1.030)
pH: 7.5 (ref 5.0–8.0)

## 2016-06-27 LAB — TROPONIN I: Troponin I: <0.03 ng/mL (ref <0.03)

## 2016-06-27 MED ORDER — METHYLPREDNISOLONE SODIUM SUCC 125 MG IJ SOLR
125.0000 mg | Freq: Once | INTRAMUSCULAR | Status: AC
  Administered 2016-06-27: 125 mg via INTRAVENOUS
  Filled 2016-06-27: qty 2

## 2016-06-27 MED ORDER — PREDNISONE 10 MG (21) PO TBPK: ORAL_TABLET | ORAL | 0 refills | Status: DC

## 2016-06-27 MED ORDER — ALBUTEROL (5 MG/ML) CONTINUOUS INHALATION SOLN
10.0000 mg/h | INHALATION_SOLUTION | Freq: Once | RESPIRATORY_TRACT | Status: AC
  Administered 2016-06-27: 10 mg/h via RESPIRATORY_TRACT

## 2016-06-27 MED ORDER — BUDESONIDE 0.5 MG/2ML IN SUSP: 0.5000 mg | Freq: Every day | RESPIRATORY_TRACT | 0 refills | Status: DC

## 2016-06-27 NOTE — ED Triage Notes (Signed)
Pt was here last week dx with pneumonia, hospital full, no bed, decided to tx pt at home. Told if no better to return. Pt continues to have SOB and tightness in chest

## 2016-06-27 NOTE — ED Notes (Signed)
Ambulated Pt Pt spo2 was 94

## 2016-06-27 NOTE — ED Provider Notes (Signed)
[facility] DEPT Provider Note   CSN: [id number] Arrival date & time: 06/27/16  9509   By signing my name below, I, [facility] Lyon Long, attest that this documentation has been prepared under the direction and in the presence of Isla Pence, MD . Electronically Signed: [facility] Lyon Long, Scribe. 06/27/2016. 9:30 AM.   History   Chief Complaint Chief Complaint  Patient presents with  . Pneumonia     The history is provided by the patient and the spouse. No language interpreter was used.    HPI Comments:  SARAIAH BHAT is a 56 y.o. female who presents to the Emergency Department complaining of gradually worsening SOB onset last week. Per nurse, pt was seen here in the ED last week and dx'd with PNA. There were no beds available so they decided to continue tx at home and told to return if symptoms worsened. She has associated symptoms of chest tightness and wheezes. Pt has been taking solu-medrol with mild relief of her symptoms. She reports intermittently checking her oxygen saturation at home with last recorded reading at 94%. Pt and spouse note quitting smoking one week ago. She denies any other associated symptoms at this time.     Past Medical History:  Diagnosis Date  . Arthritis   . Asthma   . COPD (chronic obstructive pulmonary disease) ([place])   . Depression   . Fibromyalgia   . Frequent urination at night   . GERD (gastroesophageal reflux disease)   . Hypercholesteremia   . IBS (irritable bowel syndrome)   . Tendonitis     Patient Active Problem List   Diagnosis Date Noted  . Sinus tachycardia 10/22/2015  . Acute exacerbation of chronic obstructive pulmonary disease (COPD) ([place]) 10/21/2015  . Anxiety 10/21/2015  . GERD (gastroesophageal reflux disease) 10/21/2015  . Chronic pain 10/21/2015  . Hyperlipidemia 10/21/2015  . Acute respiratory failure with hypoxia ([place]) 02/18/2015  . COPD (chronic obstructive pulmonary disease) ([place]) 02/16/2015  . COPD exacerbation  ([place]) 02/16/2015  . Mycoplasma pneumonia 02/16/2015  . Seizure ([place]) 02/22/2012  . Bleeding hemorrhoid 02/22/2012  . Acute encephalopathy 02/21/2012  . Asthma 02/21/2012  . Depression 02/21/2012    Past Surgical History:  Procedure Laterality Date  . ABDOMINAL HYSTERECTOMY    . APPENDECTOMY      OB History    No data available       Home Medications    Prior to Admission medications   Medication Sig Start Date End Date Taking? Authorizing Provider  acetaminophen (TYLENOL) 500 MG tablet Take 1,000-1,500 mg by mouth every 6 (six) hours as needed for moderate pain.   Yes Historical Provider, MD  albuterol (PROVENTIL HFA;VENTOLIN HFA) 108 (90 BASE) MCG/ACT inhaler Inhale 2 puffs into the lungs every 6 (six) hours as needed.   Yes Historical Provider, MD  ALPRAZolam Duanne Moron) 1 MG tablet Take 1 mg by mouth 3 (three) times daily.   Yes Historical Provider, MD  amoxicillin-clavulanate (AUGMENTIN XR) 1000-62.5 MG 12 hr tablet Take 2 tablets by mouth 2 (two) times daily. 06/22/16  Yes Evalee Jefferson, PA-C  amphetamine-dextroamphetamine (ADDERALL XR) 30 MG 24 hr capsule Take 90 mg by mouth every morning.   Yes Historical Provider, MD  aspirin EC 81 MG tablet Take 81 mg by mouth daily.   Yes Historical Provider, MD  diltiazem (CARDIZEM) 30 MG tablet Take 1 tablet (30 mg total) by mouth 3 (three) times daily. 10/24/15  Yes Kathie Dike, MD  fluticasone-salmeterol (ADVAIR HFA) 230-21 MCG/ACT inhaler Inhale  2 puffs into the lungs 2 (two) times daily.   Yes Historical Provider, MD  guaiFENesin (MUCINEX) 600 MG 12 hr tablet Take 1 tablet (600 mg total) by mouth 2 (two) times daily. 10/24/15  Yes Kathie Dike, MD  halobetasol (ULTRAVATE) 0.05 % cream Apply 1 application topically 2 (two) times daily. eczema   Yes Historical Provider, MD  ipratropium-albuterol (DUONEB) 0.5-2.5 (3) MG/3ML SOLN Take 3 mLs by nebulization 4 (four) times daily. 02/20/15  Yes Samuella Cota, MD  Loperamide HCl  (ANTI-DIARRHEAL PO) Take 2 capsules by mouth daily as needed (constipation).   Yes Historical Provider, MD  loratadine (CLARITIN) 10 MG tablet Take 10 mg by mouth daily as needed for allergies.    Yes Historical Provider, MD  mirabegron ER (MYRBETRIQ) 50 MG TB24 tablet Take 50 mg by mouth daily.   Yes Historical Provider, MD  montelukast (SINGULAIR) 10 MG tablet Take 10 mg by mouth at bedtime.   Yes Historical Provider, MD  naphazoline (NAPHCON) 0.1 % ophthalmic solution Place 1 drop into both eyes 4 (four) times daily as needed for irritation or allergies.    Yes Historical Provider, MD  omeprazole (PRILOSEC) 20 MG capsule Take 20 mg by mouth daily.   Yes Historical Provider, MD  oxyCODONE-acetaminophen (PERCOCET) 10-325 MG per tablet Take 1 tablet by mouth 3 (three) times daily.   Yes Historical Provider, MD  rosuvastatin (CRESTOR) 20 MG tablet Take 20 mg by mouth daily.   Yes Historical Provider, MD  traZODone (DESYREL) 50 MG tablet Take 150 mg by mouth at bedtime.    Yes Historical Provider, MD  azithromycin (ZITHROMAX) 250 MG tablet Take one tablet daily by mouth for 4 days. Patient not taking: Reported on 06/27/2016 06/22/16   Evalee Jefferson, PA-C  budesonide (PULMICORT) 0.5 MG/2ML nebulizer solution Take 2 mLs (0.5 mg total) by nebulization daily. 06/27/16   Isla Pence, MD  ciclopirox (PENLAC) 8 % solution Apply 1 application topically at bedtime. Apply over nail and surrounding skin. Apply daily over previous coat. After seven (7) days, may remove with alcohol and continue cycle.    Historical Provider, MD  predniSONE (STERAPRED UNI-PAK 21 TAB) 10 MG (21) TBPK tablet Take 6 tabs by mouth daily  for 2 days, then 5 tabs for 2 days, then 4 tabs for 2 days, then 3 tabs for 2 days, 2 tabs for 2 days, then 1 tab by mouth daily for 2 days 06/27/16   Isla Pence, MD  promethazine-codeine [facility] WITH CODEINE) 6.25-10 MG/5ML syrup Take 5 mLs by mouth every 4 (four) hours as needed for cough. 06/22/16    Evalee Jefferson, PA-C    Family History No family history on file.  Social History Social History  Substance Use Topics  . Smoking status: Current Every Day Smoker    Packs/day: 0.25    Types: Cigarettes  . Smokeless tobacco: Never Used  . Alcohol use No     Allergies   Methadone and Aspirin   Review of Systems Review of Systems 10 systems reviewed and all are negative for acute change except as noted in the HPI.    Physical Exam Updated Vital Signs BP 149/90   Pulse 89   Temp 98.1 F (36.7 C)   Resp 25   Ht '5\' 2"'$  (1.575 m)   Wt 169 lb (76.7 kg)   SpO2 94%   BMI 30.91 kg/m   Physical Exam  Constitutional: She is oriented to person, place, and time. She appears well-developed and  well-nourished.  HENT:  Head: Normocephalic and atraumatic.  Right Ear: External ear normal.  Left Ear: External ear normal.  Nose: Nose normal.  Eyes: Right eye exhibits no discharge. Left eye exhibits no discharge.  Cardiovascular: Normal rate, regular rhythm and normal heart sounds.   Pulmonary/Chest: Effort normal. Tachypnea noted. She has wheezes (bilateral).  Abdominal: Soft. There is no tenderness.  Neurological: She is alert and oriented to person, place, and time.  Skin: Skin is warm and dry.  Nursing note and vitals reviewed.    ED Treatments / Results  DIAGNOSTIC STUDIES:  Oxygen Saturation is 96% on RA, adequate by my interpretation.    COORDINATION OF CARE:  9:28 AM Discussed treatment plan with pt at bedside including CXR and breathing tx and pt agreed to plan.  Labs (all labs ordered are listed, but only abnormal results are displayed) Labs Reviewed  COMPREHENSIVE METABOLIC PANEL - Abnormal; Notable for the following:       Result Value   Glucose, Bld 110 (*)    Calcium 8.8 (*)    Total Protein 6.2 (*)    All other components within normal limits  CBC WITH DIFFERENTIAL/PLATELET  TROPONIN I  URINALYSIS, ROUTINE W REFLEX MICROSCOPIC    EKG  EKG  Interpretation  Date/Time:  Monday June 27 2016 09:48:40 EST Ventricular Rate:  82 PR Interval:    QRS Duration: 106 QT Interval:  408 QTC Calculation: 477 R Axis:   67 Text Interpretation:  Sinus rhythm Probable anterior infarct, old Confirmed by [facility] MD, Rustyn Conery [phone]) on 06/27/2016 10:08:19 AM       Radiology Dg Chest Port 1 View  Result Date: 06/27/2016 CLINICAL DATA:  Persistent shortness of Breath, recent history of pneumonia EXAM: PORTABLE CHEST 1 VIEW COMPARISON:  06/21/2016 FINDINGS: The heart size and mediastinal contours are within normal limits. Both lungs are clear. The visualized skeletal structures are unremarkable. IMPRESSION: No active disease. Electronically Signed   By: Inez Catalina M.D.   On: 06/27/2016 09:50    Procedures Procedures (including critical care time)  Medications Ordered in ED Medications  methylPREDNISolone sodium succinate (SOLU-MEDROL) 125 mg/2 mL injection 125 mg (125 mg Intravenous Given 06/27/16 1010)  albuterol (PROVENTIL,VENTOLIN) solution continuous neb (10 mg/hr Nebulization Given 06/27/16 1147)     Initial Impression / Assessment and Plan / ED Course  I have reviewed the triage vital signs and the nursing notes.  Pertinent labs & imaging results that were available during my care of the patient were reviewed by me and considered in my medical decision making (see chart for details).    Pt has improved after nebs and steroids.  She said she needs to stay so she can better.  However, there is not an indication for inpatient.  Her oxygen saturations are good, she is able to ambulate with out them dropping.  She does not look bad on exam.  She is encouraged to not smoke and to take prednisone and pulmicort.  She knows to return if worse and to f/u with her pcp.  Final Clinical Impressions(s) / ED Diagnoses   Final diagnoses:  COPD exacerbation ([facility])    New Prescriptions New Prescriptions   BUDESONIDE (PULMICORT) 0.5 MG/2ML  NEBULIZER SOLUTION    Take 2 mLs (0.5 mg total) by nebulization daily.   PREDNISONE (STERAPRED UNI-PAK 21 TAB) 10 MG (21) TBPK TABLET    Take 6 tabs by mouth daily  for 2 days, then 5 tabs for 2 days, then 4 tabs for  2 days, then 3 tabs for 2 days, 2 tabs for 2 days, then 1 tab by mouth daily for 2 days   I personally performed the services described in this documentation, which was scribed in my presence. The recorded information has been reviewed and is accurate.     Isla Pence, MD 06/27/16 [phone]

## 2016-07-06 ENCOUNTER — Inpatient Hospital Stay (HOSPITAL_COMMUNITY)
Admission: EM | Admit: 2016-07-06 | Discharge: 2016-07-11 | DRG: 191 | Disposition: A | Discharge status: Home / Self Care | Payer: Medicaid Other | Attending: Internal Medicine | Admitting: Internal Medicine

## 2016-07-06 ENCOUNTER — Encounter (HOSPITAL_COMMUNITY): Payer: Self-pay | Admitting: Emergency Medicine

## 2016-07-06 DIAGNOSIS — J45901 Unspecified asthma with (acute) exacerbation: Secondary | ICD-10-CM | POA: Diagnosis present

## 2016-07-06 DIAGNOSIS — Z79891 Long term (current) use of opiate analgesic: Secondary | ICD-10-CM

## 2016-07-06 DIAGNOSIS — M1990 Unspecified osteoarthritis, unspecified site: Secondary | ICD-10-CM | POA: Diagnosis present

## 2016-07-06 DIAGNOSIS — K589 Irritable bowel syndrome without diarrhea: Secondary | ICD-10-CM | POA: Diagnosis present

## 2016-07-06 DIAGNOSIS — R0603 Acute respiratory distress: Secondary | ICD-10-CM

## 2016-07-06 DIAGNOSIS — E785 Hyperlipidemia, unspecified: Secondary | ICD-10-CM | POA: Diagnosis present

## 2016-07-06 DIAGNOSIS — K219 Gastro-esophageal reflux disease without esophagitis: Secondary | ICD-10-CM | POA: Diagnosis present

## 2016-07-06 DIAGNOSIS — R350 Frequency of micturition: Secondary | ICD-10-CM | POA: Diagnosis present

## 2016-07-06 DIAGNOSIS — Z87891 Personal history of nicotine dependence: Secondary | ICD-10-CM

## 2016-07-06 DIAGNOSIS — M797 Fibromyalgia: Secondary | ICD-10-CM | POA: Diagnosis present

## 2016-07-06 DIAGNOSIS — M779 Enthesopathy, unspecified: Secondary | ICD-10-CM | POA: Diagnosis present

## 2016-07-06 DIAGNOSIS — F419 Anxiety disorder, unspecified: Secondary | ICD-10-CM | POA: Diagnosis present

## 2016-07-06 DIAGNOSIS — Z885 Allergy status to narcotic agent status: Secondary | ICD-10-CM

## 2016-07-06 DIAGNOSIS — E7800 Pure hypercholesterolemia, unspecified: Secondary | ICD-10-CM | POA: Diagnosis present

## 2016-07-06 DIAGNOSIS — J441 Chronic obstructive pulmonary disease with (acute) exacerbation: Principal | ICD-10-CM | POA: Diagnosis present

## 2016-07-06 DIAGNOSIS — Z7951 Long term (current) use of inhaled steroids: Secondary | ICD-10-CM

## 2016-07-06 DIAGNOSIS — Z7982 Long term (current) use of aspirin: Secondary | ICD-10-CM

## 2016-07-06 DIAGNOSIS — F418 Other specified anxiety disorders: Secondary | ICD-10-CM | POA: Diagnosis present

## 2016-07-06 DIAGNOSIS — Z886 Allergy status to analgesic agent status: Secondary | ICD-10-CM

## 2016-07-06 DIAGNOSIS — G8929 Other chronic pain: Secondary | ICD-10-CM | POA: Diagnosis present

## 2016-07-06 DIAGNOSIS — Z79899 Other long term (current) drug therapy: Secondary | ICD-10-CM

## 2016-07-06 LAB — BASIC METABOLIC PANEL
Anion gap: 10 (ref 5–15)
BUN: 22 mg/dL — ABNORMAL HIGH (ref 6–20)
CALCIUM: 9 mg/dL (ref 8.9–10.3)
CO2: 27 mmol/L (ref 22–32)
CREATININE: 0.92 mg/dL (ref 0.44–1.00)
Chloride: 100 mmol/L — ABNORMAL LOW (ref 101–111)
Glucose, Bld: 114 mg/dL — ABNORMAL HIGH (ref 65–99)
Potassium: 3.4 mmol/L — ABNORMAL LOW (ref 3.5–5.1)
SODIUM: 137 mmol/L (ref 135–145)

## 2016-07-06 LAB — D-DIMER, QUANTITATIVE (NOT AT ARMC): D DIMER QUANT: 0.99 ug{FEU}/mL — AB (ref 0.00–0.50)

## 2016-07-06 LAB — CBC
HCT: 44.6 % (ref 36.0–46.0)
Hemoglobin: 14.8 g/dL (ref 12.0–15.0)
MCH: 30.3 pg (ref 26.0–34.0)
MCHC: 33.2 g/dL (ref 30.0–36.0)
MCV: 91.2 fL (ref 78.0–100.0)
PLATELETS: 252 10*3/uL (ref 150–400)
RBC: 4.89 MIL/uL (ref 3.87–5.11)
RDW: 15 % (ref 11.5–15.5)
WBC: 10.3 10*3/uL (ref 4.0–10.5)

## 2016-07-06 LAB — TROPONIN I: Troponin I: <0.03 ng/mL (ref <0.03)

## 2016-07-06 MED ORDER — ALBUTEROL SULFATE (2.5 MG/3ML) 0.083% IN NEBU
5.0000 mg | INHALATION_SOLUTION | Freq: Once | RESPIRATORY_TRACT | Status: AC
  Administered 2016-07-06: 5 mg via RESPIRATORY_TRACT
  Filled 2016-07-06: qty 6

## 2016-07-06 MED ORDER — OXYCODONE-ACETAMINOPHEN 5-325 MG PO TABS
2.0000 | ORAL_TABLET | Freq: Once | ORAL | Status: AC
  Administered 2016-07-06: 2 via ORAL
  Filled 2016-07-06: qty 2

## 2016-07-06 MED ORDER — METHYLPREDNISOLONE SODIUM SUCC 125 MG IJ SOLR
125.0000 mg | Freq: Once | INTRAMUSCULAR | Status: AC
  Administered 2016-07-06: 125 mg via INTRAVENOUS
  Filled 2016-07-06: qty 2

## 2016-07-06 NOTE — ED Triage Notes (Addendum)
Pt states she has been sick x one month and states it hurts to breathe. Pt states she has been seen here twice recently for the same. Pt speaking in complete sentences.

## 2016-07-06 NOTE — ED Provider Notes (Signed)
[facility] DEPT Provider Note   CSN: [id number] Arrival date & time: 07/06/16  1911     History   Chief Complaint Chief Complaint  Patient presents with  . Shortness of Breath    HPI Theresa Pace is a 56 y.o. female with a history of COPD and stopped smoking within the past 2 weeks presenting with persistent and worsening shortness of breath, wheezing and now new left upper back pain over the past week. She was seen here for her copd both on 1/30 and again on 2/5 during which she was treated for a probable pneumonia.  She completed a course of zithromax and Augmentin, prednisone taper and was also started on pulmicort with still worsened sx.  Her cough has been nonproductive and she denies fevers, chills, chest pain, n/v/d.  She has not been able to see her pcp for this illness and has found no alleviators.  The history is provided by the patient.    Past Medical History:  Diagnosis Date  . Arthritis   . Asthma   . COPD (chronic obstructive pulmonary disease) ([place])   . Depression   . Fibromyalgia   . Frequent urination at night   . GERD (gastroesophageal reflux disease)   . Hypercholesteremia   . IBS (irritable bowel syndrome)   . Tendonitis     Patient Active Problem List   Diagnosis Date Noted  . Sinus tachycardia 10/22/2015  . Acute exacerbation of chronic obstructive pulmonary disease (COPD) ([place]) 10/21/2015  . Anxiety 10/21/2015  . GERD (gastroesophageal reflux disease) 10/21/2015  . Chronic pain 10/21/2015  . Hyperlipidemia 10/21/2015  . Acute respiratory failure with hypoxia ([place]) 02/18/2015  . COPD (chronic obstructive pulmonary disease) ([place]) 02/16/2015  . COPD exacerbation ([place]) 02/16/2015  . Mycoplasma pneumonia 02/16/2015  . Seizure ([place]) 02/22/2012  . Bleeding hemorrhoid 02/22/2012  . Acute encephalopathy 02/21/2012  . Asthma 02/21/2012  . Depression 02/21/2012    Past Surgical History:  Procedure Laterality Date  . ABDOMINAL HYSTERECTOMY      . APPENDECTOMY      OB History    No data available       Home Medications    Prior to Admission medications   Medication Sig Start Date End Date Taking? Authorizing Provider  acetaminophen (TYLENOL) 500 MG tablet Take 1,000-1,500 mg by mouth every 6 (six) hours as needed for moderate pain.   Yes Historical Provider, MD  albuterol (PROVENTIL HFA;VENTOLIN HFA) 108 (90 BASE) MCG/ACT inhaler Inhale 2 puffs into the lungs every 6 (six) hours as needed.   Yes Historical Provider, MD  ALPRAZolam Duanne Moron) 1 MG tablet Take 1 mg by mouth 3 (three) times daily.   Yes Historical Provider, MD  amphetamine-dextroamphetamine (ADDERALL XR) 30 MG 24 hr capsule Take 90 mg by mouth every morning.   Yes Historical Provider, MD  aspirin EC 81 MG tablet Take 81 mg by mouth daily.   Yes Historical Provider, MD  budesonide (PULMICORT) 0.5 MG/2ML nebulizer solution Take 2 mLs (0.5 mg total) by nebulization daily. 06/27/16  Yes Isla Pence, MD  ciclopirox [facility]) 8 % solution Apply 1 application topically at bedtime. Apply over nail and surrounding skin. Apply daily over previous coat. After seven (7) days, may remove with alcohol and continue cycle.   Yes Historical Provider, MD  diltiazem (CARDIZEM) 30 MG tablet Take 1 tablet (30 mg total) by mouth 3 (three) times daily. 10/24/15  Yes Kathie Dike, MD  fluticasone-salmeterol (ADVAIR HFA) 230-21 MCG/ACT inhaler Inhale 2 puffs into  the lungs 2 (two) times daily.   Yes Historical Provider, MD  guaiFENesin (MUCINEX) 600 MG 12 hr tablet Take 1 tablet (600 mg total) by mouth 2 (two) times daily. 10/24/15  Yes Kathie Dike, MD  halobetasol (ULTRAVATE) 0.05 % cream Apply 1 application topically 2 (two) times daily. eczema   Yes Historical Provider, MD  ipratropium-albuterol (DUONEB) 0.5-2.5 (3) MG/3ML SOLN Take 3 mLs by nebulization 4 (four) times daily. 02/20/15  Yes Samuella Cota, MD  Loperamide HCl (ANTI-DIARRHEAL PO) Take 2 capsules by mouth daily as needed  (constipation).   Yes Historical Provider, MD  loratadine (CLARITIN) 10 MG tablet Take 10 mg by mouth daily as needed for allergies.    Yes Historical Provider, MD  mirabegron ER (MYRBETRIQ) 50 MG TB24 tablet Take 50 mg by mouth daily.   Yes Historical Provider, MD  montelukast (SINGULAIR) 10 MG tablet Take 10 mg by mouth at bedtime.   Yes Historical Provider, MD  omeprazole (PRILOSEC) 20 MG capsule Take 20 mg by mouth daily.   Yes Historical Provider, MD  oxyCODONE-acetaminophen (PERCOCET) 10-325 MG per tablet Take 1 tablet by mouth 3 (three) times daily.   Yes Historical Provider, MD  predniSONE (STERAPRED UNI-PAK 21 TAB) 10 MG (21) TBPK tablet Take 6 tabs by mouth daily  for 2 days, then 5 tabs for 2 days, then 4 tabs for 2 days, then 3 tabs for 2 days, 2 tabs for 2 days, then 1 tab by mouth daily for 2 days 06/27/16  Yes Isla Pence, MD  rosuvastatin (CRESTOR) 20 MG tablet Take 20 mg by mouth daily.   Yes Historical Provider, MD  traZODone (DESYREL) 50 MG tablet Take 150 mg by mouth at bedtime.    Yes Historical Provider, MD  amoxicillin-clavulanate (AUGMENTIN XR) 1000-62.5 MG 12 hr tablet Take 2 tablets by mouth 2 (two) times daily. Patient not taking: Reported on 07/06/2016 06/22/16   Evalee Jefferson, PA-C  azithromycin (ZITHROMAX) 250 MG tablet Take one tablet daily by mouth for 4 days. Patient not taking: Reported on 06/27/2016 06/22/16   Evalee Jefferson, PA-C  naphazoline (NAPHCON) 0.1 % ophthalmic solution Place 1 drop into both eyes 4 (four) times daily as needed for irritation or allergies.     Historical Provider, MD  promethazine-codeine (PHENERGAN WITH CODEINE) 6.25-10 MG/5ML syrup Take 5 mLs by mouth every 4 (four) hours as needed for cough. Patient not taking: Reported on 07/06/2016 06/22/16   Evalee Jefferson, PA-C    Family History History reviewed. No pertinent family history.  Social History Social History  Substance Use Topics  . Smoking status: Former Smoker    Packs/day: 0.25    Types:  Cigarettes  . Smokeless tobacco: Never Used  . Alcohol use No     Allergies   Methadone and Aspirin   Review of Systems Review of Systems  Constitutional: Negative for fever.  HENT: Negative for congestion and sore throat.   Eyes: Negative.   Respiratory: Positive for cough, chest tightness, shortness of breath and wheezing.   Cardiovascular: Positive for palpitations. Negative for chest pain and leg swelling.  Gastrointestinal: Negative for abdominal pain and nausea.  Genitourinary: Negative.   Musculoskeletal: Positive for back pain. Negative for arthralgias, joint swelling and neck pain.  Skin: Negative.  Negative for rash and wound.  Neurological: Negative for dizziness, weakness, light-headedness, numbness and headaches.  Psychiatric/Behavioral: Negative.      Physical Exam Updated Vital Signs BP 145/89   Pulse 84   Temp 99.4 F (  37.4 C) (Oral)   Resp 24   Ht '5\' 2"'$  (1.575 m)   Wt 76.7 kg   SpO2 93%   BMI 30.91 kg/m   Physical Exam  Constitutional: She appears well-developed and well-nourished.  HENT:  Head: Normocephalic and atraumatic.  Eyes: Conjunctivae are normal.  Neck: Normal range of motion.  Cardiovascular: Regular rhythm, normal heart sounds and intact distal pulses.  Tachycardia present.   Pulmonary/Chest: Effort normal. She has decreased breath sounds. She has wheezes.  Inspiratory and expiratory wheezing. Prolonged expirations.  Abdominal: Soft. Bowel sounds are normal. There is no tenderness.  Musculoskeletal: Normal range of motion.  Neurological: She is alert.  Skin: Skin is warm and dry.  Psychiatric: She has a normal mood and affect.  Nursing note and vitals reviewed.    ED Treatments / Results  Labs (all labs ordered are listed, but only abnormal results are displayed) Labs Reviewed  BASIC METABOLIC PANEL - Abnormal; Notable for the following:       Result Value   Potassium 3.4 (*)    Chloride 100 (*)    Glucose, Bld 114 (*)     BUN 22 (*)    All other components within normal limits  D-DIMER, QUANTITATIVE (NOT AT [facility]) - Abnormal; Notable for the following:    D-Dimer, Quant 0.99 (*)    All other components within normal limits  CBC  TROPONIN I    EKG  EKG Interpretation  Date/Time:  Wednesday July 06 2016 23:14:25 EST Ventricular Rate:  92 PR Interval:    QRS Duration: 93 QT Interval:  383 QTC Calculation: 474 R Axis:   51 Text Interpretation:  Sinus rhythm Consider anterior infarct When compared with ECG of 06/27/2016, No significant change was found Confirmed by [facility]  MD, DAVID ([id number]) on 07/07/2016 12:50:26 AM       Radiology Ct Angio Chest Pe W Or Wo Contrast  Result Date: 07/07/2016 CLINICAL DATA:  Elevated D-dimer, shortness of breath and upper back pain. Sick for 1 month. History of COPD, asthma, mycoplasma pneumonia. EXAM: CT ANGIOGRAPHY CHEST WITH CONTRAST TECHNIQUE: Multidetector CT imaging of the chest was performed using the standard protocol during bolus administration of intravenous contrast. Multiplanar CT image reconstructions and MIPs were obtained to evaluate the vascular anatomy. CONTRAST:  100 cc Isovue 370 COMPARISON:  Chest radiograph June 27, 2016 FINDINGS: CARDIOVASCULAR: Adequate contrast opacification of the pulmonary artery's. Main pulmonary artery is not enlarged. No pulmonary arterial filling defects to the level of the subsegmental branches. Heart size is normal, no right heart strain. Mild coronary artery calcifications. Small pericardial effusion. Thoracic aorta is normal course and caliber, mild calcific atherosclerosis of the aortic arch. MEDIASTINUM/NODES: No lymphadenopathy by CT size criteria. LUNGS/PLEURA: Tracheobronchial tree is patent, no pneumothorax. Mild bronchial wall thickening. 6 mm ground-glass nodule RIGHT middle lobe, series 6, image 46/85 ; no indicated follow-up. No pleural effusion or focal consolidation. Mild centrilobular emphysema. UPPER ABDOMEN:  Included view of the abdomen is nonacute. MUSCULOSKELETAL: Visualized soft tissues and included osseous structures appear normal. Review of the MIP images confirms the above findings. IMPRESSION: No acute pulmonary embolism. Mild bronchial wall thickening associated with reactive airway and bronchitis, no focal consolidation. Mild centrilobular emphysema. Electronically Signed   By: Elon Alas M.D.   On: 07/07/2016 00:47    Procedures Procedures (including critical care time)  Medications Ordered in ED Medications  albuterol (PROVENTIL) (2.5 MG/3ML) 0.083% nebulizer solution 5 mg (5 mg Nebulization Given 07/06/16 2212)  methylPREDNISolone sodium  succinate (SOLU-MEDROL) 125 mg/2 mL injection 125 mg (125 mg Intravenous Given 07/06/16 2322)  oxyCODONE-acetaminophen (PERCOCET/ROXICET) 5-325 MG per tablet 2 tablet (2 tablets Oral Given 07/06/16 2322)  iopamidol (ISOVUE-370) 76 % injection 100 mL (100 mLs Intravenous Contrast Given 07/07/16 0026)  albuterol (PROVENTIL,VENTOLIN) solution continuous neb (10 mg/hr Nebulization Given 07/07/16 0136)  ipratropium (ATROVENT) nebulizer solution 0.5 mg (0.5 mg Nebulization Given 07/07/16 0136)     Initial Impression / Assessment and Plan / ED Course  I have reviewed the triage vital signs and the nursing notes.  Pertinent labs & imaging results that were available during my care of the patient were reviewed by me and considered in my medical decision making (see chart for details).     Pt still with considerable inspiratory and expiratory wheezing after solumedrol and initial albuterol neb tx.  She was ordered an hour long albuterol and atrovent neb.    Pt with 3rd visit back within the past 2 weeks with no improvement in wheezing and sob.  Will probably need admission if not improved after 2nd neb tx.  Discussed with Dr. Roxanne Mins who will follow patient.  Final Clinical Impressions(s) / ED Diagnoses   Final diagnoses:  COPD exacerbation [facility])     New Prescriptions New Prescriptions   No medications on file     Evalee Jefferson, Hershal Coria 29/56/21 [id number]    Delora Fuel, MD 57/84/69 [id number]

## 2016-07-07 ENCOUNTER — Emergency Department (HOSPITAL_COMMUNITY): Payer: Medicaid Other

## 2016-07-07 DIAGNOSIS — J441 Chronic obstructive pulmonary disease with (acute) exacerbation: Secondary | ICD-10-CM | POA: Diagnosis not present

## 2016-07-07 DIAGNOSIS — Z87891 Personal history of nicotine dependence: Secondary | ICD-10-CM | POA: Diagnosis not present

## 2016-07-07 DIAGNOSIS — K219 Gastro-esophageal reflux disease without esophagitis: Secondary | ICD-10-CM | POA: Diagnosis not present

## 2016-07-07 DIAGNOSIS — R0603 Acute respiratory distress: Secondary | ICD-10-CM | POA: Diagnosis not present

## 2016-07-07 DIAGNOSIS — Z79891 Long term (current) use of opiate analgesic: Secondary | ICD-10-CM | POA: Diagnosis not present

## 2016-07-07 DIAGNOSIS — Z7951 Long term (current) use of inhaled steroids: Secondary | ICD-10-CM | POA: Diagnosis not present

## 2016-07-07 DIAGNOSIS — J4551 Severe persistent asthma with (acute) exacerbation: Secondary | ICD-10-CM | POA: Diagnosis not present

## 2016-07-07 DIAGNOSIS — M797 Fibromyalgia: Secondary | ICD-10-CM | POA: Diagnosis present

## 2016-07-07 DIAGNOSIS — M779 Enthesopathy, unspecified: Secondary | ICD-10-CM | POA: Diagnosis present

## 2016-07-07 DIAGNOSIS — E782 Mixed hyperlipidemia: Secondary | ICD-10-CM | POA: Diagnosis not present

## 2016-07-07 DIAGNOSIS — F418 Other specified anxiety disorders: Secondary | ICD-10-CM | POA: Diagnosis present

## 2016-07-07 DIAGNOSIS — Z886 Allergy status to analgesic agent status: Secondary | ICD-10-CM | POA: Diagnosis not present

## 2016-07-07 DIAGNOSIS — Z79899 Other long term (current) drug therapy: Secondary | ICD-10-CM | POA: Diagnosis not present

## 2016-07-07 DIAGNOSIS — M1990 Unspecified osteoarthritis, unspecified site: Secondary | ICD-10-CM | POA: Diagnosis present

## 2016-07-07 DIAGNOSIS — J45901 Unspecified asthma with (acute) exacerbation: Secondary | ICD-10-CM | POA: Diagnosis present

## 2016-07-07 DIAGNOSIS — R0602 Shortness of breath: Secondary | ICD-10-CM | POA: Diagnosis not present

## 2016-07-07 DIAGNOSIS — R350 Frequency of micturition: Secondary | ICD-10-CM | POA: Diagnosis present

## 2016-07-07 DIAGNOSIS — F419 Anxiety disorder, unspecified: Secondary | ICD-10-CM | POA: Diagnosis not present

## 2016-07-07 DIAGNOSIS — Z7982 Long term (current) use of aspirin: Secondary | ICD-10-CM | POA: Diagnosis not present

## 2016-07-07 DIAGNOSIS — E785 Hyperlipidemia, unspecified: Secondary | ICD-10-CM | POA: Diagnosis present

## 2016-07-07 DIAGNOSIS — K589 Irritable bowel syndrome without diarrhea: Secondary | ICD-10-CM | POA: Diagnosis present

## 2016-07-07 DIAGNOSIS — E7800 Pure hypercholesterolemia, unspecified: Secondary | ICD-10-CM | POA: Diagnosis present

## 2016-07-07 DIAGNOSIS — Z885 Allergy status to narcotic agent status: Secondary | ICD-10-CM | POA: Diagnosis not present

## 2016-07-07 LAB — GLUCOSE, CAPILLARY: Glucose-Capillary: 259 mg/dL — ABNORMAL HIGH (ref 65–99)

## 2016-07-07 MED ORDER — ACETAMINOPHEN 650 MG RE SUPP
650.0000 mg | Freq: Four times a day (QID) | RECTAL | Status: DC | PRN
Start: 2016-07-07 — End: 2016-07-11

## 2016-07-07 MED ORDER — OXYCODONE-ACETAMINOPHEN 5-325 MG PO TABS
1.0000 | ORAL_TABLET | Freq: Three times a day (TID) | ORAL | Status: DC
Start: 2016-07-07 — End: 2016-07-09
  Administered 2016-07-07 – 2016-07-09 (×7): 1 via ORAL
  Filled 2016-07-07 (×7): qty 1

## 2016-07-07 MED ORDER — ALBUTEROL (5 MG/ML) CONTINUOUS INHALATION SOLN
10.0000 mg/h | INHALATION_SOLUTION | Freq: Once | RESPIRATORY_TRACT | Status: AC
  Administered 2016-07-07: 10 mg/h via RESPIRATORY_TRACT
  Filled 2016-07-07: qty 20

## 2016-07-07 MED ORDER — MIRABEGRON ER 25 MG PO TB24
50.0000 mg | ORAL_TABLET | Freq: Every day | ORAL | Status: DC
  Administered 2016-07-07 – 2016-07-11 (×5): 50 mg via ORAL
  Filled 2016-07-07 (×5): qty 2

## 2016-07-07 MED ORDER — AMPHETAMINE-DEXTROAMPHET ER 30 MG PO CP24: 90.0000 mg | ORAL_CAPSULE | ORAL | Status: DC

## 2016-07-07 MED ORDER — LORATADINE 10 MG PO TABS: 10.0000 mg | ORAL_TABLET | Freq: Every day | ORAL | Status: DC | PRN

## 2016-07-07 MED ORDER — ALPRAZOLAM 1 MG PO TABS
1.0000 mg | ORAL_TABLET | Freq: Three times a day (TID) | ORAL | Status: DC
  Administered 2016-07-07 – 2016-07-11 (×13): 1 mg via ORAL
  Filled 2016-07-07 (×13): qty 1

## 2016-07-07 MED ORDER — BUDESONIDE 0.5 MG/2ML IN SUSP
0.5000 mg | Freq: Every day | RESPIRATORY_TRACT | Status: DC
  Administered 2016-07-07 – 2016-07-11 (×5): 0.5 mg via RESPIRATORY_TRACT
  Filled 2016-07-07 (×5): qty 2

## 2016-07-07 MED ORDER — TRAZODONE HCL 50 MG PO TABS
150.0000 mg | ORAL_TABLET | Freq: Every day | ORAL | Status: DC
  Administered 2016-07-07 – 2016-07-10 (×4): 150 mg via ORAL
  Filled 2016-07-07 (×4): qty 3

## 2016-07-07 MED ORDER — IPRATROPIUM-ALBUTEROL 0.5-2.5 (3) MG/3ML IN SOLN
3.0000 mL | RESPIRATORY_TRACT | Status: DC
  Administered 2016-07-07 – 2016-07-08 (×9): 3 mL via RESPIRATORY_TRACT
  Filled 2016-07-07 (×9): qty 3

## 2016-07-07 MED ORDER — GUAIFENESIN ER 600 MG PO TB12
600.0000 mg | ORAL_TABLET | Freq: Two times a day (BID) | ORAL | Status: DC | PRN
  Administered 2016-07-08: 600 mg via ORAL

## 2016-07-07 MED ORDER — CICLOPIROX 8 % EX SOLN: 1.0000 "application " | Freq: Every day | CUTANEOUS | Status: DC

## 2016-07-07 MED ORDER — ROSUVASTATIN CALCIUM 20 MG PO TABS
20.0000 mg | ORAL_TABLET | Freq: Every day | ORAL | Status: DC
  Administered 2016-07-07 – 2016-07-11 (×5): 20 mg via ORAL
  Filled 2016-07-07 (×5): qty 1

## 2016-07-07 MED ORDER — GUAIFENESIN ER 600 MG PO TB12
600.0000 mg | ORAL_TABLET | Freq: Two times a day (BID) | ORAL | Status: DC
  Administered 2016-07-07 – 2016-07-11 (×9): 600 mg via ORAL
  Filled 2016-07-07 (×9): qty 1

## 2016-07-07 MED ORDER — PANTOPRAZOLE SODIUM 40 MG PO TBEC
40.0000 mg | DELAYED_RELEASE_TABLET | Freq: Every day | ORAL | Status: DC
Start: 2016-07-07 — End: 2016-07-07

## 2016-07-07 MED ORDER — FLUTICASONE FUROATE-VILANTEROL 200-25 MCG/INH IN AEPB
1.0000 | INHALATION_SPRAY | Freq: Every day | RESPIRATORY_TRACT | Status: DC
  Administered 2016-07-07 – 2016-07-11 (×5): 1 via RESPIRATORY_TRACT
  Filled 2016-07-07: qty 28

## 2016-07-07 MED ORDER — MONTELUKAST SODIUM 10 MG PO TABS
10.0000 mg | ORAL_TABLET | Freq: Every day | ORAL | Status: DC
  Administered 2016-07-07 – 2016-07-10 (×4): 10 mg via ORAL
  Filled 2016-07-07 (×4): qty 1

## 2016-07-07 MED ORDER — HALOBETASOL PROPIONATE 0.05 % EX CREA: 1.0000 "application " | TOPICAL_CREAM | Freq: Two times a day (BID) | CUTANEOUS | Status: DC

## 2016-07-07 MED ORDER — ACETAMINOPHEN 325 MG PO TABS
650.0000 mg | ORAL_TABLET | Freq: Four times a day (QID) | ORAL | Status: DC | PRN
  Administered 2016-07-07 – 2016-07-08 (×3): 650 mg via ORAL
  Filled 2016-07-07 (×4): qty 2

## 2016-07-07 MED ORDER — OXYCODONE-ACETAMINOPHEN 10-325 MG PO TABS: 1.0000 | ORAL_TABLET | Freq: Three times a day (TID) | ORAL | Status: DC

## 2016-07-07 MED ORDER — ASPIRIN EC 81 MG PO TBEC
81.0000 mg | DELAYED_RELEASE_TABLET | Freq: Every day | ORAL | Status: DC
  Administered 2016-07-07 – 2016-07-11 (×5): 81 mg via ORAL
  Filled 2016-07-07 (×5): qty 1

## 2016-07-07 MED ORDER — OXYCODONE HCL 5 MG PO TABS
5.0000 mg | ORAL_TABLET | Freq: Three times a day (TID) | ORAL | Status: DC
  Administered 2016-07-07 – 2016-07-09 (×7): 5 mg via ORAL
  Filled 2016-07-07 (×7): qty 1

## 2016-07-07 MED ORDER — METHYLPREDNISOLONE SODIUM SUCC 40 MG IJ SOLR
40.0000 mg | Freq: Three times a day (TID) | INTRAMUSCULAR | Status: DC
  Administered 2016-07-07 – 2016-07-11 (×14): 40 mg via INTRAVENOUS
  Filled 2016-07-07 (×14): qty 1

## 2016-07-07 MED ORDER — IOPAMIDOL (ISOVUE-370) INJECTION 76%
100.0000 mL | Freq: Once | INTRAVENOUS | Status: AC | PRN
  Administered 2016-07-07: 100 mL via INTRAVENOUS

## 2016-07-07 MED ORDER — IPRATROPIUM BROMIDE 0.02 % IN SOLN
0.5000 mg | Freq: Once | RESPIRATORY_TRACT | Status: AC
  Administered 2016-07-07: 0.5 mg via RESPIRATORY_TRACT
  Filled 2016-07-07: qty 2.5

## 2016-07-07 MED ORDER — SENNOSIDES-DOCUSATE SODIUM 8.6-50 MG PO TABS
1.0000 | ORAL_TABLET | Freq: Every evening | ORAL | Status: DC | PRN
Start: 2016-07-07 — End: 2016-07-07

## 2016-07-07 MED ORDER — PANTOPRAZOLE SODIUM 40 MG PO TBEC
40.0000 mg | DELAYED_RELEASE_TABLET | Freq: Two times a day (BID) | ORAL | Status: DC
  Administered 2016-07-07 – 2016-07-11 (×9): 40 mg via ORAL
  Filled 2016-07-07 (×9): qty 1

## 2016-07-07 MED ORDER — POLYETHYLENE GLYCOL 3350 17 G PO PACK
17.0000 g | PACK | Freq: Every day | ORAL | Status: DC
  Administered 2016-07-08: 17 g via ORAL
  Filled 2016-07-07 (×4): qty 1

## 2016-07-07 MED ORDER — DILTIAZEM HCL 30 MG PO TABS
30.0000 mg | ORAL_TABLET | Freq: Three times a day (TID) | ORAL | Status: DC
  Administered 2016-07-07 – 2016-07-11 (×13): 30 mg via ORAL
  Filled 2016-07-07 (×13): qty 1

## 2016-07-07 MED ORDER — PROMETHAZINE HCL 12.5 MG PO TABS: 12.5000 mg | ORAL_TABLET | Freq: Four times a day (QID) | ORAL | Status: DC | PRN

## 2016-07-07 NOTE — Care Management Note (Signed)
Case Management Note  Patient Details  Name: Theresa Pace MRN: [id number] Date of Birth: July 27, 1960  Subjective/Objective:                  Pt admitted with COPD. She is from home, lives with husband who is at the bedside. Pt is ind with ALD's. She does not use assistive device with ambulation. Pt has neb machine PTA. She does not have supplemental oxygen PTA.   Action/Plan: Pt plans to return home with self care. Pt currently on supplemental oxygen, will cont to follow to ensure pt is able to wean.   Expected Discharge Date:    07/08/2016              Expected Discharge Plan:  Home/Self Care  In-House Referral:  NA  Discharge planning Services  CM Consult  Post Acute Care Choice:  NA Choice offered to:  NA  Status of Service:  In process, will continue to follow  Sherald Barge, RN 07/07/2016, 9:47 AM

## 2016-07-07 NOTE — Progress Notes (Signed)
Follow up note from earlier admission:  56 yo with COPD depression anxiety GERD HLD, admitted for asthma exacerbation.  She was given IV steroid, nebs, and is feeling a little better.  At home, she uses Advair.  Will Tx her for her GERD more aggressively.  Continue with current Tx.  She is still wheezing as of this am, but is doing better.   Orvan Falconer MD FACP.  Hospitalist.

## 2016-07-07 NOTE — H&P (Signed)
History and Physical    Theresa Pace ION:[id number] DOB: 20-Feb-1961 DOA: 07/06/2016  PCP: Gar Ponto, MD  Patient coming from: Home  Chief Complaint: Shortness of breath  HPI: Theresa Pace is a 56 y.o. female with medical history significant of COPD, depression, anxiety, GERD, hyperlipidemia comes to the ED with complaints of shortness of breath. Patient went to her PCP on January 27 with the complaints of acute URI type symptoms at that time chest x-ray was done which showed pneumonia. She also had signs of fluid therefore she was started on doxycycline and Tamiflu. Despite of this her symptoms do not improve therefore she came to the ER in 06/22/2016, at that time her antibiotics were changed from doxycycline to azithromycin and amoxicillin. She was sent home but then she returned again on 06/27/2016 with the similar symptoms at that time she was given treatment with steroids and breathing treatment and her symptoms significantly improved therefore sent home again. Today she returns again with the complaints of shortness of breath, productive cough bringing up whitish phlegm and some headache. She also reports off slight chest tightness. She tried using her home inhalers without much of her relief therefore she came to the ED. She used to be a heavy smoker but quit smoking about a month ago. No other complaints at this time  ED Course: Patient was given a couple of treatments with bronchodilators and steroids with some improvement but she continued to have abnormal lung exam. CTA of the chest was done which was negative for PE but showed reactive airway disease or bronchitis. Her labs were unremarkable.  Review of Systems: As per HPI otherwise 10 point review of systems negative.    Past Medical History:  Diagnosis Date  . Arthritis   . Asthma   . COPD (chronic obstructive pulmonary disease) ([place])   . Depression   . Fibromyalgia   . Frequent urination at night   . GERD  (gastroesophageal reflux disease)   . Hypercholesteremia   . IBS (irritable bowel syndrome)   . Tendonitis     Past Surgical History:  Procedure Laterality Date  . ABDOMINAL HYSTERECTOMY    . APPENDECTOMY       reports that she has quit smoking. Her smoking use included Cigarettes. She smoked 0.25 packs per day. She has never used smokeless tobacco. She reports that she does not drink alcohol or use drugs.  Allergies  Allergen Reactions  . Methadone Hives  . Aspirin Other (See Comments)    Stomach Pain    History reviewed. No pertinent family history.   Prior to Admission medications   Medication Sig Start Date End Date Taking? Authorizing Provider  acetaminophen (TYLENOL) 500 MG tablet Take 1,000-1,500 mg by mouth every 6 (six) hours as needed for moderate pain.   Yes Historical Provider, MD  albuterol (PROVENTIL HFA;VENTOLIN HFA) 108 (90 BASE) MCG/ACT inhaler Inhale 2 puffs into the lungs every 6 (six) hours as needed.   Yes Historical Provider, MD  ALPRAZolam Duanne Moron) 1 MG tablet Take 1 mg by mouth 3 (three) times daily.   Yes Historical Provider, MD  amphetamine-dextroamphetamine (ADDERALL XR) 30 MG 24 hr capsule Take 90 mg by mouth every morning.   Yes Historical Provider, MD  aspirin EC 81 MG tablet Take 81 mg by mouth daily.   Yes Historical Provider, MD  budesonide (PULMICORT) 0.5 MG/2ML nebulizer solution Take 2 mLs (0.5 mg total) by nebulization daily. 06/27/16  Yes Isla Pence, MD  ciclopirox [facility]) 8 %  solution Apply 1 application topically at bedtime. Apply over nail and surrounding skin. Apply daily over previous coat. After seven (7) days, may remove with alcohol and continue cycle.   Yes Historical Provider, MD  diltiazem (CARDIZEM) 30 MG tablet Take 1 tablet (30 mg total) by mouth 3 (three) times daily. 10/24/15  Yes Kathie Dike, MD  fluticasone-salmeterol (ADVAIR HFA) 230-21 MCG/ACT inhaler Inhale 2 puffs into the lungs 2 (two) times daily.   Yes Historical  Provider, MD  guaiFENesin (MUCINEX) 600 MG 12 hr tablet Take 1 tablet (600 mg total) by mouth 2 (two) times daily. 10/24/15  Yes Kathie Dike, MD  halobetasol (ULTRAVATE) 0.05 % cream Apply 1 application topically 2 (two) times daily. eczema   Yes Historical Provider, MD  ipratropium-albuterol (DUONEB) 0.5-2.5 (3) MG/3ML SOLN Take 3 mLs by nebulization 4 (four) times daily. 02/20/15  Yes Samuella Cota, MD  Loperamide HCl (ANTI-DIARRHEAL PO) Take 2 capsules by mouth daily as needed (constipation).   Yes Historical Provider, MD  loratadine (CLARITIN) 10 MG tablet Take 10 mg by mouth daily as needed for allergies.    Yes Historical Provider, MD  mirabegron ER (MYRBETRIQ) 50 MG TB24 tablet Take 50 mg by mouth daily.   Yes Historical Provider, MD  montelukast (SINGULAIR) 10 MG tablet Take 10 mg by mouth at bedtime.   Yes Historical Provider, MD  omeprazole (PRILOSEC) 20 MG capsule Take 20 mg by mouth daily.   Yes Historical Provider, MD  oxyCODONE-acetaminophen (PERCOCET) 10-325 MG per tablet Take 1 tablet by mouth 3 (three) times daily.   Yes Historical Provider, MD  predniSONE (STERAPRED UNI-PAK 21 TAB) 10 MG (21) TBPK tablet Take 6 tabs by mouth daily  for 2 days, then 5 tabs for 2 days, then 4 tabs for 2 days, then 3 tabs for 2 days, 2 tabs for 2 days, then 1 tab by mouth daily for 2 days 06/27/16  Yes Isla Pence, MD  rosuvastatin (CRESTOR) 20 MG tablet Take 20 mg by mouth daily.   Yes Historical Provider, MD  traZODone (DESYREL) 50 MG tablet Take 150 mg by mouth at bedtime.    Yes Historical Provider, MD  amoxicillin-clavulanate (AUGMENTIN XR) 1000-62.5 MG 12 hr tablet Take 2 tablets by mouth 2 (two) times daily. Patient not taking: Reported on 07/06/2016 06/22/16   Evalee Jefferson, PA-C  azithromycin (ZITHROMAX) 250 MG tablet Take one tablet daily by mouth for 4 days. Patient not taking: Reported on 06/27/2016 06/22/16   Evalee Jefferson, PA-C  naphazoline (NAPHCON) 0.1 % ophthalmic solution Place 1 drop into  both eyes 4 (four) times daily as needed for irritation or allergies.     Historical Provider, MD  promethazine-codeine (PHENERGAN WITH CODEINE) 6.25-10 MG/5ML syrup Take 5 mLs by mouth every 4 (four) hours as needed for cough. Patient not taking: Reported on 07/06/2016 06/22/16   Evalee Jefferson, PA-C    Physical Exam: Vitals:   07/06/16 1926 07/06/16 2210 07/06/16 2212 07/07/16 0139  BP:  145/89    Pulse:  84    Resp:  24    Temp:      TempSrc:      SpO2:  94% 93% 93%  Weight: 76.7 kg (169 lb)     Height: '5\' 2"'$  (1.575 m)         Constitutional: NAD, calm, comfortable Vitals:   07/06/16 1926 07/06/16 2210 07/06/16 2212 07/07/16 0139  BP:  145/89    Pulse:  84    Resp:  24  Temp:      TempSrc:      SpO2:  94% 93% 93%  Weight: 76.7 kg (169 lb)     Height: '5\' 2"'$  (1.575 m)      Eyes: PERRL, lids and conjunctivae normal ENMT: Mucous membranes are moist. Posterior pharynx clear of any exudate or lesions.Normal dentition.  Neck: normal, supple, no masses, no thyromegaly Respiratory: Diffuse diminished breath sounds with mild expiratory wheezing Cardiovascular: Regular rate and rhythm, no murmurs / rubs / gallops. No extremity edema. 2+ pedal pulses. No carotid bruits.  Abdomen: no tenderness, no masses palpated. No hepatosplenomegaly. Bowel sounds positive.  Musculoskeletal: no clubbing / cyanosis. No joint deformity upper and lower extremities. Good ROM, no contractures. Normal muscle tone.  Skin: no rashes, lesions, ulcers. No induration Neurologic: CN 2-12 grossly intact. Sensation intact, DTR normal. Strength 5/5 in all 4.  Psychiatric: Normal judgment and insight. Alert and oriented x 3. Normal mood.   Labs on Admission: I have personally reviewed following labs and imaging studies  CBC:  Recent Labs Lab 07/06/16 2231  WBC 10.3  HGB 14.8  HCT 44.6  MCV 91.2  PLT 026   Basic Metabolic Panel:  Recent Labs Lab 07/06/16 2231  NA 137  K 3.4*  CL 100*  CO2 27   GLUCOSE 114*  BUN 22*  CREATININE 0.92  CALCIUM 9.0   GFR: Estimated Creatinine Clearance: 67 mL/min (by C-G formula based on SCr of 0.92 mg/dL). Liver Function Tests: No results for input(s): AST, ALT, ALKPHOS, BILITOT, PROT, ALBUMIN in the last 168 hours. No results for input(s): LIPASE, AMYLASE in the last 168 hours. No results for input(s): AMMONIA in the last 168 hours. Coagulation Profile: No results for input(s): INR, PROTIME in the last 168 hours. Cardiac Enzymes:  Recent Labs Lab 07/06/16 2226  TROPONINI <0.03   BNP (last 3 results) No results for input(s): PROBNP in the last 8760 hours. HbA1C: No results for input(s): HGBA1C in the last 72 hours. CBG: No results for input(s): GLUCAP in the last 168 hours. Lipid Profile: No results for input(s): CHOL, HDL, LDLCALC, TRIG, CHOLHDL, LDLDIRECT in the last 72 hours. Thyroid Function Tests: No results for input(s): TSH, T4TOTAL, FREET4, T3FREE, THYROIDAB in the last 72 hours. Anemia Panel: No results for input(s): VITAMINB12, FOLATE, FERRITIN, TIBC, IRON, RETICCTPCT in the last 72 hours. Urine analysis:    Component Value Date/Time   COLORURINE YELLOW 06/27/2016 0932   APPEARANCEUR CLEAR 06/27/2016 0932   LABSPEC 1.010 06/27/2016 0932   PHURINE 7.5 06/27/2016 0932   GLUCOSEU NEGATIVE 06/27/2016 0932   HGBUR NEGATIVE 06/27/2016 0932   BILIRUBINUR NEGATIVE 06/27/2016 0932   KETONESUR NEGATIVE 06/27/2016 0932   PROTEINUR NEGATIVE 06/27/2016 0932   UROBILINOGEN 0.2 02/21/2012 0055   NITRITE NEGATIVE 06/27/2016 0932   LEUKOCYTESUR NEGATIVE 06/27/2016 0932   Sepsis Labs: !!!!!!!!!!!!!!!!!!!!!!!!!!!!!!!!!!!!!!!!!!!! '@LABRCNTIP'$ (procalcitonin:4,lacticidven:4) )No results found for this or any previous visit (from the past 240 hour(s)).   Radiological Exams on Admission: Ct Angio Chest Pe W Or Wo Contrast  Result Date: 07/07/2016 CLINICAL DATA:  Elevated D-dimer, shortness of breath and upper back pain. Sick for 1  month. History of COPD, asthma, mycoplasma pneumonia. EXAM: CT ANGIOGRAPHY CHEST WITH CONTRAST TECHNIQUE: Multidetector CT imaging of the chest was performed using the standard protocol during bolus administration of intravenous contrast. Multiplanar CT image reconstructions and MIPs were obtained to evaluate the vascular anatomy. CONTRAST:  100 cc Isovue 370 COMPARISON:  Chest radiograph June 27, 2016 FINDINGS: CARDIOVASCULAR: Adequate contrast opacification  of the pulmonary artery's. Main pulmonary artery is not enlarged. No pulmonary arterial filling defects to the level of the subsegmental branches. Heart size is normal, no right heart strain. Mild coronary artery calcifications. Small pericardial effusion. Thoracic aorta is normal course and caliber, mild calcific atherosclerosis of the aortic arch. MEDIASTINUM/NODES: No lymphadenopathy by CT size criteria. LUNGS/PLEURA: Tracheobronchial tree is patent, no pneumothorax. Mild bronchial wall thickening. 6 mm ground-glass nodule RIGHT middle lobe, series 6, image 46/85 ; no indicated follow-up. No pleural effusion or focal consolidation. Mild centrilobular emphysema. UPPER ABDOMEN: Included view of the abdomen is nonacute. MUSCULOSKELETAL: Visualized soft tissues and included osseous structures appear normal. Review of the MIP images confirms the above findings. IMPRESSION: No acute pulmonary embolism. Mild bronchial wall thickening associated with reactive airway and bronchitis, no focal consolidation. Mild centrilobular emphysema. Electronically Signed   By: Elon Alas M.D.   On: 07/07/2016 00:47    EKG: Independently reviewed.   Assessment/Plan Principal Problem:   Acute exacerbation of chronic obstructive pulmonary disease (COPD) (HCC) Active Problems:   COPD exacerbation (HCC)   Anxiety   GERD (gastroesophageal reflux disease)   Chronic pain   Hyperlipidemia   Acute respiratory distress   1. Acute respiratory distress/moderate  exacerbation of persistent COPD -Admit for further care as she has failed outpatient treatment -No need for antibiotics at this time as she was recently treated, and no active signs of infection at this time -DuoNeb, Solu-Medrol 40 mg IV every 8 hours, guaifenesin when necessary -Supplemental O2 if needed -CTA of the chest done which was negative for PE but showed reactive airway disease -If her symptoms does not improve with double measures, can try short course of antibiotic treatment.  2. Hyperlipidemia-continue home medication 3. Anxiety continue home Xanax 4. Hyperlipidemia continue Crestor 5. Depression-continue home meds    DVT prophylaxis: SCDs Code Status: full Family Communication: husband at bedside Disposition Plan: TBD Consults called: None needed Admission status: Obs   Aahna Rossa Arsenio Loader MD [facility] Pager [phone]   If 7PM-7AM, please contact night-coverage [email or web address] Password [facility]  07/07/2016, 3:43 AM

## 2016-07-07 NOTE — Progress Notes (Signed)
Inpatient Diabetes Program Recommendations  AACE/ADA: New Consensus Statement on Inpatient Glycemic Control (2015)  Target Ranges:  Prepandial:   less than 140 mg/dL      Peak postprandial:   less than 180 mg/dL (1-2 hours)      Critically ill patients:  140 - 180 mg/dL  Results for FANCY, DUNKLEY (MRN [id number]) as of 07/07/2016 11:56  Ref. Range 07/07/2016 07:25  Glucose-Capillary Latest Ref Range: 65 - 99 mg/dL 259 (H)  Results for JYRAH, BLYE (MRN [id number]) as of 07/07/2016 11:56  Ref. Range 07/06/2016 22:31  Glucose Latest Ref Range: 65 - 99 mg/dL 114 (H)   Review of Glycemic Control  Diabetes history: NO Outpatient Diabetes medications: NA Current orders for Inpatient glycemic control: None  Inpatient Diabetes Program Recommendations: Correction (SSI): While inpatient and ordered steroids, please consider ordering CBGs wtih Novolog correction scale ACHS.  Thanks, Barnie Alderman, RN, MSN, CDE Diabetes Coordinator Inpatient Diabetes Program [phone] (Team Pager from 8am to 5pm)

## 2016-07-08 DIAGNOSIS — R0603 Acute respiratory distress: Secondary | ICD-10-CM

## 2016-07-08 DIAGNOSIS — F419 Anxiety disorder, unspecified: Secondary | ICD-10-CM

## 2016-07-08 DIAGNOSIS — J441 Chronic obstructive pulmonary disease with (acute) exacerbation: Principal | ICD-10-CM

## 2016-07-08 LAB — GLUCOSE, CAPILLARY: Glucose-Capillary: 235 mg/dL — ABNORMAL HIGH (ref 65–99)

## 2016-07-08 MED ORDER — KETOROLAC TROMETHAMINE 30 MG/ML IJ SOLN
30.0000 mg | Freq: Four times a day (QID) | INTRAMUSCULAR | Status: DC | PRN
  Administered 2016-07-09 (×2): 30 mg via INTRAVENOUS
  Filled 2016-07-08 (×2): qty 1

## 2016-07-08 MED ORDER — IPRATROPIUM-ALBUTEROL 0.5-2.5 (3) MG/3ML IN SOLN
3.0000 mL | Freq: Four times a day (QID) | RESPIRATORY_TRACT | Status: DC
  Administered 2016-07-09 – 2016-07-11 (×11): 3 mL via RESPIRATORY_TRACT
  Filled 2016-07-08 (×11): qty 3

## 2016-07-08 NOTE — Progress Notes (Signed)
PROGRESS NOTE    Theresa Pace  HWE:[id number] DOB: 04-26-1961 DOA: 07/06/2016 PCP: Gar Ponto, MD    Brief Narrative: 56 yo with COPD depression anxiety GERD HLD, admitted for asthma exacerbation.  She was given IV steroid, nebs, and is feeling a little better.  At home, she uses Advair. Her PPI have been increased, and she is feeling better.  She is complaining of her back pain.  She still has wheezing bilaterally.   Assessment & Plan:   Principal Problem:   Acute exacerbation of chronic obstructive pulmonary disease (COPD) (HCC) Active Problems:   COPD exacerbation (HCC)   Anxiety   GERD (gastroesophageal reflux disease)   Chronic pain   Hyperlipidemia   Acute respiratory distress   Asthma exacerbation  1. Acute respiratory distress/moderate exacerbation of persistent COPD      Continue with IV steroid, nebs, as she is improved.  Will give IV Toradol PRN for her back pain.  2. Hyperlipidemia-continue home medication 3. Anxiety continue home Xanax 4. Hyperlipidemia continue Crestor 5. Depression-continue home meds  DVT prophylaxis: SCD.  Code Status: FULL Family Communication: Son and husband at bedside.  Disposition Plan: To home when appropriate.   Consultants:   None.   Procedures:   None.   Antimicrobials: Anti-infectives    None       Subjective: No improvement.   Objective: Vitals:   07/08/16 0809 07/08/16 0816 07/08/16 1244 07/08/16 1403  BP:    131/88  Pulse:    98  Resp:    18  Temp:    98.1 F (36.7 C)  TempSrc:    Oral  SpO2: 100% 100% 95% 96%  Weight:      Height:        Intake/Output Summary (Last 24 hours) at 07/08/16 1419 Last data filed at 07/08/16 0900  Gross per 24 hour  Intake              480 ml  Output              300 ml  Net              180 ml   Filed Weights   07/06/16 1926 07/07/16 0445 07/08/16 0704  Weight: 76.7 kg (169 lb) 87.6 kg (193 lb 1.6 oz) 87.6 kg (193 lb 3.2 oz)    Examination:  General exam:  Appears calm and comfortable  Respiratory system: wheezing much improved.  Still persists however.  Cardiovascular system: S1 & S2 heard, RRR. No JVD, murmurs, rubs, gallops or clicks. No pedal edema. Gastrointestinal system: Abdomen is nondistended, soft and nontender. No organomegaly or masses felt. Normal bowel sounds heard. Central nervous system: Alert and oriented. No focal neurological deficits. Extremities: Symmetric 5 x 5 power. Skin: No rashes, lesions or ulcers Psychiatry: Judgement and insight appear normal. Mood & affect appropriate.   Data Reviewed: I have personally reviewed following labs and imaging studies  CBC:  Recent Labs Lab 07/06/16 2231  WBC 10.3  HGB 14.8  HCT 44.6  MCV 91.2  PLT 893   Basic Metabolic Panel:  Recent Labs Lab 07/06/16 2231  NA 137  K 3.4*  CL 100*  CO2 27  GLUCOSE 114*  BUN 22*  CREATININE 0.92  CALCIUM 9.0   GFR: Cardiac Enzymes:  Recent Labs Lab 07/06/16 2226  TROPONINI <0.03   BNP (last 3 results) CBG:  Recent Labs Lab 07/07/16 0725 07/08/16 0731  GLUCAP 259* 235*   Lipid Profile:  Radiology Studies:  Ct Angio Chest Pe W Or Wo Contrast  Result Date: 07/07/2016 CLINICAL DATA:  Elevated D-dimer, shortness of breath and upper back pain. Sick for 1 month. History of COPD, asthma, mycoplasma pneumonia. EXAM: CT ANGIOGRAPHY CHEST WITH CONTRAST TECHNIQUE: Multidetector CT imaging of the chest was performed using the standard protocol during bolus administration of intravenous contrast. Multiplanar CT image reconstructions and MIPs were obtained to evaluate the vascular anatomy. CONTRAST:  100 cc Isovue 370 COMPARISON:  Chest radiograph June 27, 2016 FINDINGS: CARDIOVASCULAR: Adequate contrast opacification of the pulmonary artery's. Main pulmonary artery is not enlarged. No pulmonary arterial filling defects to the level of the subsegmental branches. Heart size is normal, no right heart strain. Mild coronary artery  calcifications. Small pericardial effusion. Thoracic aorta is normal course and caliber, mild calcific atherosclerosis of the aortic arch. MEDIASTINUM/NODES: No lymphadenopathy by CT size criteria. LUNGS/PLEURA: Tracheobronchial tree is patent, no pneumothorax. Mild bronchial wall thickening. 6 mm ground-glass nodule RIGHT middle lobe, series 6, image 46/85 ; no indicated follow-up. No pleural effusion or focal consolidation. Mild centrilobular emphysema. UPPER ABDOMEN: Included view of the abdomen is nonacute. MUSCULOSKELETAL: Visualized soft tissues and included osseous structures appear normal. Review of the MIP images confirms the above findings. IMPRESSION: No acute pulmonary embolism. Mild bronchial wall thickening associated with reactive airway and bronchitis, no focal consolidation. Mild centrilobular emphysema. Electronically Signed   By: Elon Alas M.D.   On: 07/07/2016 00:47    Scheduled Meds: . ALPRAZolam  1 mg Oral TID  . amphetamine-dextroamphetamine  90 mg Oral BH-q7a  . aspirin EC  81 mg Oral Daily  . budesonide  0.5 mg Nebulization Daily  . ciclopirox  1 application Topical QHS  . diltiazem  30 mg Oral TID  . fluticasone furoate-vilanterol  1 puff Inhalation Daily  . guaiFENesin  600 mg Oral BID  . halobetasol  1 application Topical BID  . ipratropium-albuterol  3 mL Nebulization Q4H  . methylPREDNISolone (SOLU-MEDROL) injection  40 mg Intravenous Q8H  . mirabegron ER  50 mg Oral Daily  . montelukast  10 mg Oral QHS  . oxyCODONE-acetaminophen  1 tablet Oral TID   And  . oxyCODONE  5 mg Oral TID  . pantoprazole  40 mg Oral BID AC  . polyethylene glycol  17 g Oral Daily  . rosuvastatin  20 mg Oral Daily  . traZODone  150 mg Oral QHS   Continuous Infusions:   LOS: 1 day   Daiana Vitiello, MD FACP Hospitalist.   If 7PM-7AM, please contact night-coverage [email or web address] Password [email or web address] 07/08/2016, 2:19 PM

## 2016-07-09 DIAGNOSIS — E782 Mixed hyperlipidemia: Secondary | ICD-10-CM

## 2016-07-09 DIAGNOSIS — J4551 Severe persistent asthma with (acute) exacerbation: Secondary | ICD-10-CM

## 2016-07-09 DIAGNOSIS — K219 Gastro-esophageal reflux disease without esophagitis: Secondary | ICD-10-CM

## 2016-07-09 LAB — GLUCOSE, CAPILLARY: Glucose-Capillary: 193 mg/dL — ABNORMAL HIGH (ref 65–99)

## 2016-07-09 MED ORDER — OXYCODONE-ACETAMINOPHEN 5-325 MG PO TABS
1.0000 | ORAL_TABLET | Freq: Four times a day (QID) | ORAL | Status: DC | PRN
  Administered 2016-07-09 – 2016-07-11 (×9): 1 via ORAL
  Filled 2016-07-09 (×9): qty 1

## 2016-07-09 MED ORDER — OXYCODONE HCL 5 MG PO TABS
5.0000 mg | ORAL_TABLET | Freq: Four times a day (QID) | ORAL | Status: DC | PRN
  Administered 2016-07-09 – 2016-07-11 (×9): 5 mg via ORAL
  Filled 2016-07-09 (×9): qty 1

## 2016-07-09 NOTE — Progress Notes (Signed)
PROGRESS NOTE    Theresa Pace  RKY:[id number] DOB: [birth year] DOA: 07/06/2016 PCP: Gar Ponto, MD     Brief Narrative: 56 yo with COPD depression anxiety GERD HLD, admitted for asthma exacerbation. She was given IV steroid, nebs, and is feeling a little better. At home, she uses Advair. Her PPI have been increased, and she is feeling better.  She is complaining of her back pain.  She still has wheezing bilaterally. No new events except her pain was not controlled at night.    Assessment & Plan:   Principal Problem:   Acute exacerbation of chronic obstructive pulmonary disease (COPD) (HCC) Active Problems:   COPD exacerbation (HCC)   Anxiety   GERD (gastroesophageal reflux disease)   Chronic pain   Hyperlipidemia   Acute respiratory distress   Asthma exacerbation  1. Acute respiratory distress/moderate exacerbation of persistent COPD      Continue with IV steroid, nebs, as she is improved.  Will give IV Toradol PRN for her back pain.  2. Hyperlipidemia-continue home medication 3. Anxietycontinue home Xanax 4. Hyperlipidemiacontinue Crestor 5. Depression-continue home meds   DVT prophylaxis: SCD.  Code Status: FULL CODE.  Family Communication: None at bedside.  Disposition Plan: Home.   Consultants:   None.   Procedures:   None.   Antimicrobials: Anti-infectives    None       Subjective:Still SOB with exertion.   Objective: Vitals:   07/09/16 0538 07/09/16 0722 07/09/16 0726 07/09/16 0728  BP: (!) 164/90     Pulse: 60     Resp: 18     Temp: 98.2 F (36.8 C)     TempSrc: Oral     SpO2: 95% 96% 96% 96%  Weight: 86.8 kg (191 lb 5.8 oz)     Height:        Intake/Output Summary (Last 24 hours) at 07/09/16 1326 Last data filed at 07/09/16 1008  Gross per 24 hour  Intake              480 ml  Output                0 ml  Net              480 ml   Filed Weights   07/07/16 0445 07/08/16 0704 07/09/16 0538  Weight: 87.6 kg (193 lb 1.6 oz) 87.6 kg  (193 lb 3.2 oz) 86.8 kg (191 lb 5.8 oz)    Examination:  General exam: Appears calm and comfortable  Respiratory system: Bilateral wheezing no rales.  Cardiovascular system: S1 & S2 heard, RRR. No JVD, murmurs, rubs, gallops or clicks. No pedal edema. Gastrointestinal system: Abdomen is nondistended, soft and nontender. No organomegaly or masses felt. Normal bowel sounds heard. Central nervous system: Alert and oriented. No focal neurological deficits. Extremities: Symmetric 5 x 5 power. Skin: No rashes, lesions or ulcers Psychiatry: Judgement and insight appear normal. Mood & affect appropriate.   Data Reviewed: I have personally reviewed following labs and imaging studies  CBC:  Recent Labs Lab 07/06/16 2231  WBC 10.3  HGB 14.8  HCT 44.6  MCV 91.2  PLT 315   Basic Metabolic Panel:  Recent Labs Lab 07/06/16 2231  NA 137  K 3.4*  CL 100*  CO2 27  GLUCOSE 114*  BUN 22*  CREATININE 0.92  CALCIUM 9.0   Cardiac Enzymes:  Recent Labs Lab 07/06/16 2226  TROPONINI <0.03   CBG:  Recent Labs Lab 07/07/16 0725 07/08/16 0731 07/09/16  Schlusser    Radiology Studies: No results found.  Scheduled Meds: . ALPRAZolam  1 mg Oral TID  . amphetamine-dextroamphetamine  90 mg Oral BH-q7a  . aspirin EC  81 mg Oral Daily  . budesonide  0.5 mg Nebulization Daily  . ciclopirox  1 application Topical QHS  . diltiazem  30 mg Oral TID  . fluticasone furoate-vilanterol  1 puff Inhalation Daily  . guaiFENesin  600 mg Oral BID  . halobetasol  1 application Topical BID  . ipratropium-albuterol  3 mL Nebulization Q6H  . methylPREDNISolone (SOLU-MEDROL) injection  40 mg Intravenous Q8H  . mirabegron ER  50 mg Oral Daily  . montelukast  10 mg Oral QHS  . pantoprazole  40 mg Oral BID AC  . polyethylene glycol  17 g Oral Daily  . rosuvastatin  20 mg Oral Daily  . traZODone  150 mg Oral QHS   Continuous Infusions:   LOS: 2 days   Rondell Frick, MD  FACP Hospitalist.   If 7PM-7AM, please contact night-coverage [email or web address] Password [email or web address] 07/09/2016, 1:26 PM

## 2016-07-10 LAB — GLUCOSE, CAPILLARY: Glucose-Capillary: 223 mg/dL — ABNORMAL HIGH (ref 65–99)

## 2016-07-10 NOTE — Progress Notes (Signed)
PROGRESS NOTE    Theresa Pace  BZJ:[id number] DOB: 08/08/1960 DOA: 07/06/2016 PCP: Gar Ponto, MD    Brief Narrative: 56 yo with COPD depression anxiety GERD HLD, admitted for asthma exacerbation. She was given IV steroid, nebs, and is feeling a little better. At home, she uses Advair. Her PPI have been increased, and she is feeling better. She is complaining of her back pain. She still has wheezing bilaterally. With increase oral narcotics, her pain has been adequately controlled at night.   Assessment & Plan:   Principal Problem:   Acute exacerbation of chronic obstructive pulmonary disease (COPD) (HCC) Active Problems:   COPD exacerbation (HCC)   Anxiety   GERD (gastroesophageal reflux disease)   Chronic pain   Hyperlipidemia   Acute respiratory distress   Asthma exacerbation  1. Acute respiratory distress/moderate exacerbation of persistent COPD Continue with IV steroid, nebs, as she is improved. Will give IV Toradol PRN for her back pain.  2. Hyperlipidemia-continue home medication 3. Anxietycontinue home Xanax 4. Hyperlipidemiacontinue Crestor 5. Depression-continue home meds   DVT prophylaxis: SCD.  Code Status: FULL CODE.  Family Communication: None at bedside.  Disposition Plan: Home.   Consultants:   None.   Procedures:   None.    Antimicrobials: Anti-infectives    None       Subjective:  Not feeling any better.    Objective: Vitals:   07/10/16 0756 07/10/16 0800 07/10/16 1305 07/10/16 1350  BP:   (!) 161/84   Pulse:   90   Resp:   20   Temp:   98 F (36.7 C)   TempSrc:   Oral   SpO2: 96% 96% 95% 95%  Weight:      Height:        Intake/Output Summary (Last 24 hours) at 07/10/16 1615 Last data filed at 07/10/16 1200  Gross per 24 hour  Intake              720 ml  Output                0 ml  Net              720 ml   Filed Weights   07/07/16 0445 07/08/16 0704 07/09/16 0538  Weight: 87.6 kg (193 lb 1.6 oz) 87.6  kg (193 lb 3.2 oz) 86.8 kg (191 lb 5.8 oz)    Examination:  General exam: Appears calm and comfortable  Respiratory system: wheezing improved, but persisted. Respiratory effort normal. Cardiovascular system: S1 & S2 heard, RRR. No JVD, murmurs, rubs, gallops or clicks. No pedal edema. Gastrointestinal system: Abdomen is nondistended, soft and nontender. No organomegaly or masses felt. Normal bowel sounds heard. Central nervous system: Alert and oriented. No focal neurological deficits. Extremities: Symmetric 5 x 5 power. Skin: No rashes, lesions or ulcers Psychiatry: Judgement and insight appear normal. Mood & affect appropriate.   Data Reviewed: I have personally reviewed following labs and imaging studies  CBC:  Recent Labs Lab 07/06/16 2231  WBC 10.3  HGB 14.8  HCT 44.6  MCV 91.2  PLT 017   Basic Metabolic Panel:  Recent Labs Lab 07/06/16 2231  NA 137  K 3.4*  CL 100*  CO2 27  GLUCOSE 114*  BUN 22*  CREATININE 0.92  CALCIUM 9.0   Cardiac Enzymes:  Recent Labs Lab 07/06/16 2226  TROPONINI <0.03   CBG:  Recent Labs Lab 07/07/16 0725 07/08/16 0731 07/09/16 0725 07/10/16 0738  GLUCAP 259* 235* 193*  223*   Radiology Studies: No results found.  Scheduled Meds: . ALPRAZolam  1 mg Oral TID  . amphetamine-dextroamphetamine  90 mg Oral BH-q7a  . aspirin EC  81 mg Oral Daily  . budesonide  0.5 mg Nebulization Daily  . ciclopirox  1 application Topical QHS  . diltiazem  30 mg Oral TID  . fluticasone furoate-vilanterol  1 puff Inhalation Daily  . guaiFENesin  600 mg Oral BID  . halobetasol  1 application Topical BID  . ipratropium-albuterol  3 mL Nebulization Q6H  . methylPREDNISolone (SOLU-MEDROL) injection  40 mg Intravenous Q8H  . mirabegron ER  50 mg Oral Daily  . montelukast  10 mg Oral QHS  . pantoprazole  40 mg Oral BID AC  . polyethylene glycol  17 g Oral Daily  . rosuvastatin  20 mg Oral Daily  . traZODone  150 mg Oral QHS   Continuous  Infusions:   LOS: 3 days   Antwanette Wesche, MD FACP Hospitalist.   If 7PM-7AM, please contact night-coverage [email or web address] Password [facility] 07/10/2016, 4:15 PM

## 2016-07-11 LAB — GLUCOSE, CAPILLARY: GLUCOSE-CAPILLARY: 235 mg/dL — AB (ref 65–99)

## 2016-07-11 MED ORDER — PANTOPRAZOLE SODIUM 40 MG PO TBEC: 40.0000 mg | DELAYED_RELEASE_TABLET | Freq: Two times a day (BID) | ORAL | 3 refills | Status: DC

## 2016-07-11 MED ORDER — POLYETHYLENE GLYCOL 3350 17 G PO PACK: 17.0000 g | PACK | Freq: Every day | ORAL | 0 refills | Status: DC

## 2016-07-11 NOTE — Progress Notes (Signed)
SATURATION QUALIFICATIONS: (This note is used to comply with regulatory documentation for home oxygen)  Patient Saturations on Room Air at Rest = 93%  Patient Saturations on Room Air while Ambulating = 92%  Patient Saturations on 2 Liters of oxygen while Ambulating = 96%  Please briefly explain why patient needs home oxygen:

## 2016-07-11 NOTE — Care Management Note (Signed)
Case Management Note  Patient Details  Name: Theresa Pace MRN: [id number] Date of Birth: 1961/04/22  Expected Discharge Date:    07/11/2016              Expected Discharge Plan:  Home/Self Care  In-House Referral:  NA  Discharge planning Services  CM Consult  Post Acute Care Choice:  NA Choice offered to:  NA  Status of Service:  Completed, signed off  Additional Comments: Pt does not qualify for supplemental oxygen at home.   Sherald Barge, RN 07/11/2016, 1:12 PM

## 2016-07-11 NOTE — Discharge Summary (Signed)
Physician Discharge Summary  Theresa Pace ZOX:[id number] DOB: [birth year] DOA: 07/06/2016  PCP: Gar Ponto, MD  Admit date: 07/06/2016 Discharge date: 07/11/2016  Admitted From: Home.  Disposition:  Home.   Recommendations for Outpatient Follow-up:  1. Follow up with PCP in 1-2 weeks  Home Health: None.  Equipment/Devices: None.  Discharge Condition: Stable.  CODE STATUS: FULL CODE.  Diet recommendation: As tolerated.   Brief/Interim Summary:  Patient was admitted for Asthma exacerbation by Dr Thelma Comp on Jul 07, 2016.  As per his H and P:  " HPI: Theresa Pace is a 56 y.o. female with medical history significant of COPD, depression, anxiety, GERD, hyperlipidemia comes to the ED with complaints of shortness of breath. Patient went to her PCP on January 27 with the complaints of acute URI type symptoms at that time chest x-ray was done which showed pneumonia. She also had signs of fluid therefore she was started on doxycycline and Tamiflu. Despite of this her symptoms do not improve therefore she came to the ER in 06/22/2016, at that time her antibiotics were changed from doxycycline to azithromycin and amoxicillin. She was sent home but then she returned again on 06/27/2016 with the similar symptoms at that time she was given treatment with steroids and breathing treatment and her symptoms significantly improved therefore sent home again. Today she returns again with the complaints of shortness of breath, productive cough bringing up whitish phlegm and some headache. She also reports off slight chest tightness. She tried using her home inhalers without much of her relief therefore she came to the ED. She used to be a heavy smoker but quit smoking about a month ago. No other complaints at this time  ED Course: Patient was given a couple of treatments with bronchodilators and steroids with some improvement but she continued to have abnormal lung exam. CTA of the chest was done which was negative for  PE but showed reactive airway disease or bronchitis. Her labs were unremarkable.  HOSPITAL COURSE:  Patient was admitted for asthma exacerbation, and she was slow to respond, so she was maintained on IV Steroids.  Though her wheezing improved with exam, she never felt better until the last day.  She said she was having recurrent asthmatic attacks, so I was wondering if it was due to her GERD.  Her PPI was changed and increased with Protonix '40mg'$  BID.  She was also given Zithromax.  She complained of her pain, and while she was in the hospital, she was given Percocet.  Her oxygen was weaned and she did not qualify for home oxygen.  She will finish a rapid steroid taper, and finish another 2 days of Zithromax.  She will follow up with her PCP next week.  Thank you for allowing me to participate in her care.  Good Day.   Discharge Diagnoses:  Principal Problem:   Acute exacerbation of chronic obstructive pulmonary disease (COPD) ([place]) Active Problems:   COPD exacerbation (HCC)   Anxiety   GERD (gastroesophageal reflux disease)   Chronic pain   Hyperlipidemia   Acute respiratory distress   Asthma exacerbation    Discharge Instructions  Discharge Instructions    Diet - low sodium heart healthy    Complete by:  As directed    Discharge instructions    Complete by:  As directed    Take your medicine as suggested.  Follow up with your PCP next week.   Increase activity slowly    Complete by:  As directed      Allergies as of 07/11/2016      Reactions   Methadone Hives   Aspirin Other (See Comments)   Stomach Pain      Medication List    STOP taking these medications   amoxicillin-clavulanate 1000-62.5 MG 12 hr tablet Commonly known as:  AUGMENTIN XR   ANTI-DIARRHEAL PO   guaiFENesin 600 MG 12 hr tablet Commonly known as:  MUCINEX   loratadine 10 MG tablet Commonly known as:  CLARITIN   montelukast 10 MG tablet Commonly known as:  SINGULAIR   naphazoline 0.1 % ophthalmic  solution Commonly known as:  NAPHCON   omeprazole 20 MG capsule Commonly known as:  PRILOSEC Replaced by:  pantoprazole 40 MG tablet   promethazine-codeine 6.25-10 MG/5ML syrup Commonly known as:  PHENERGAN with CODEINE     TAKE these medications   acetaminophen 500 MG tablet Commonly known as:  TYLENOL Take 1,000-1,500 mg by mouth every 6 (six) hours as needed for moderate pain.   albuterol 108 (90 Base) MCG/ACT inhaler Commonly known as:  PROVENTIL HFA;VENTOLIN HFA Inhale 2 puffs into the lungs every 6 (six) hours as needed.   ALPRAZolam 1 MG tablet Commonly known as:  XANAX Take 1 mg by mouth 3 (three) times daily.   amphetamine-dextroamphetamine 30 MG 24 hr capsule Commonly known as:  ADDERALL XR Take 90 mg by mouth every morning.   aspirin EC 81 MG tablet Take 81 mg by mouth daily.   azithromycin 250 MG tablet Commonly known as:  ZITHROMAX Take one tablet daily by mouth for 4 days.   budesonide 0.5 MG/2ML nebulizer solution Commonly known as:  PULMICORT Take 2 mLs (0.5 mg total) by nebulization daily.   ciclopirox 8 % solution Commonly known as:  PENLAC Apply 1 application topically at bedtime. Apply over nail and surrounding skin. Apply daily over previous coat. After seven (7) days, may remove with alcohol and continue cycle.   diltiazem 30 MG tablet Commonly known as:  CARDIZEM Take 1 tablet (30 mg total) by mouth 3 (three) times daily.   fluticasone-salmeterol 230-21 MCG/ACT inhaler Commonly known as:  ADVAIR HFA Inhale 2 puffs into the lungs 2 (two) times daily.   halobetasol 0.05 % cream Commonly known as:  ULTRAVATE Apply 1 application topically 2 (two) times daily. eczema   ipratropium-albuterol 0.5-2.5 (3) MG/3ML Soln Commonly known as:  DUONEB Take 3 mLs by nebulization 4 (four) times daily.   MYRBETRIQ 50 MG Tb24 tablet Generic drug:  mirabegron ER Take 50 mg by mouth daily.   oxyCODONE-acetaminophen 10-325 MG tablet Commonly known as:   PERCOCET Take 1 tablet by mouth 3 (three) times daily.   pantoprazole 40 MG tablet Commonly known as:  PROTONIX Take 1 tablet (40 mg total) by mouth 2 (two) times daily before a meal. Replaces:  omeprazole 20 MG capsule   polyethylene glycol packet Commonly known as:  MIRALAX / GLYCOLAX Take 17 g by mouth daily. Start taking on:  07/12/2016   predniSONE 10 MG (21) Tbpk tablet Commonly known as:  STERAPRED UNI-PAK 21 TAB Take 6 tabs by mouth daily  for 2 days, then 5 tabs for 2 days, then 4 tabs for 2 days, then 3 tabs for 2 days, 2 tabs for 2 days, then 1 tab by mouth daily for 2 days   rosuvastatin 20 MG tablet Commonly known as:  CRESTOR Take 20 mg by mouth daily.   traZODone 50 MG tablet Commonly known as:  DESYREL Take 150 mg by mouth at bedtime.       Allergies  Allergen Reactions  . Methadone Hives  . Aspirin Other (See Comments)    Stomach Pain    Consultations:  None.    Procedures/Studies: Dg Chest 2 View  Result Date: 06/21/2016 CLINICAL DATA:  Dyspnea and cough for 3 days. Recent influenza diagnosis. EXAM: CHEST  2 VIEW COMPARISON:  06/18/2016 FINDINGS: Streaky opacities in the lingula appear less confluent compared to the prior examination. No confluent airspace consolidation. No effusions. Normal pulmonary vasculature. Hilar and mediastinal contours are unremarkable and unchanged. IMPRESSION: Mild streaky lingular opacities. No confluent consolidation or effusion. Electronically Signed   By: Andreas Newport M.D.   On: 06/21/2016 19:36   Ct Angio Chest Pe W Or Wo Contrast  Result Date: 07/07/2016 CLINICAL DATA:  Elevated D-dimer, shortness of breath and upper back pain. Sick for 1 month. History of COPD, asthma, mycoplasma pneumonia. EXAM: CT ANGIOGRAPHY CHEST WITH CONTRAST TECHNIQUE: Multidetector CT imaging of the chest was performed using the standard protocol during bolus administration of intravenous contrast. Multiplanar CT image reconstructions and  MIPs were obtained to evaluate the vascular anatomy. CONTRAST:  100 cc Isovue 370 COMPARISON:  Chest radiograph June 27, 2016 FINDINGS: CARDIOVASCULAR: Adequate contrast opacification of the pulmonary artery's. Main pulmonary artery is not enlarged. No pulmonary arterial filling defects to the level of the subsegmental branches. Heart size is normal, no right heart strain. Mild coronary artery calcifications. Small pericardial effusion. Thoracic aorta is normal course and caliber, mild calcific atherosclerosis of the aortic arch. MEDIASTINUM/NODES: No lymphadenopathy by CT size criteria. LUNGS/PLEURA: Tracheobronchial tree is patent, no pneumothorax. Mild bronchial wall thickening. 6 mm ground-glass nodule RIGHT middle lobe, series 6, image 46/85 ; no indicated follow-up. No pleural effusion or focal consolidation. Mild centrilobular emphysema. UPPER ABDOMEN: Included view of the abdomen is nonacute. MUSCULOSKELETAL: Visualized soft tissues and included osseous structures appear normal. Review of the MIP images confirms the above findings. IMPRESSION: No acute pulmonary embolism. Mild bronchial wall thickening associated with reactive airway and bronchitis, no focal consolidation. Mild centrilobular emphysema. Electronically Signed   By: Elon Alas M.D.   On: 07/07/2016 00:47   Dg Chest Port 1 View  Result Date: 06/27/2016 CLINICAL DATA:  Persistent shortness of Breath, recent history of pneumonia EXAM: PORTABLE CHEST 1 VIEW COMPARISON:  06/21/2016 FINDINGS: The heart size and mediastinal contours are within normal limits. Both lungs are clear. The visualized skeletal structures are unremarkable. IMPRESSION: No active disease. Electronically Signed   By: Inez Catalina M.D.   On: 06/27/2016 09:50       Subjective:   Discharge Exam: Vitals:   07/10/16 2231 07/11/16 0627  BP: (!) 169/95 (!) 169/95  Pulse: 91 87  Resp: 18 18  Temp: 98.4 F (36.9 C) 97.5 F (36.4 C)   Vitals:   07/11/16  0627 07/11/16 0730 07/11/16 0733 07/11/16 0742  BP: (!) 169/95     Pulse: 87     Resp: 18     Temp: 97.5 F (36.4 C)     TempSrc: Oral     SpO2: 93% 96% 96% 96%  Weight:      Height:        General: Pt is alert, awake, not in acute distress Cardiovascular: RRR, S1/S2 +, no rubs, no gallops Respiratory: CTA bilaterally, no wheezing, no rhonchi Abdominal: Soft, NT, ND, bowel sounds + Extremities: no edema, no cyanosis    The results of significant diagnostics from  this hospitalization (including imaging, microbiology, ancillary and laboratory) are listed below for reference.     Microbiology: No results found for this or any previous visit (from the past 240 hour(s)).   Labs: BNP (last 3 results) No results for input(s): BNP in the last 8760 hours. Basic Metabolic Panel:  Recent Labs Lab 07/06/16 2231  NA 137  K 3.4*  CL 100*  CO2 27  GLUCOSE 114*  BUN 22*  CREATININE 0.92  CALCIUM 9.0   Liver Function Tests: No results for input(s): AST, ALT, ALKPHOS, BILITOT, PROT, ALBUMIN in the last 168 hours. No results for input(s): LIPASE, AMYLASE in the last 168 hours. No results for input(s): AMMONIA in the last 168 hours. CBC:  Recent Labs Lab 07/06/16 2231  WBC 10.3  HGB 14.8  HCT 44.6  MCV 91.2  PLT 252   Cardiac Enzymes:  Recent Labs Lab 07/06/16 2226  TROPONINI <0.03   BNP: Invalid input(s): POCBNP CBG:  Recent Labs Lab 07/07/16 0725 07/08/16 0731 07/09/16 0725 07/10/16 0738 07/11/16 0744  GLUCAP 259* 235* 193* 223* 235*   D-Dimer No results for input(s): DDIMER in the last 72 hours. Hgb A1c No results for input(s): HGBA1C in the last 72 hours. Lipid Profile No results for input(s): CHOL, HDL, LDLCALC, TRIG, CHOLHDL, LDLDIRECT in the last 72 hours. Thyroid function studies No results for input(s): TSH, T4TOTAL, T3FREE, THYROIDAB in the last 72 hours.  Invalid input(s): FREET3 Anemia work up No results for input(s): VITAMINB12,  FOLATE, FERRITIN, TIBC, IRON, RETICCTPCT in the last 72 hours. Urinalysis    Component Value Date/Time   COLORURINE YELLOW 06/27/2016 0932   APPEARANCEUR CLEAR 06/27/2016 0932   LABSPEC 1.010 06/27/2016 0932   PHURINE 7.5 06/27/2016 0932   GLUCOSEU NEGATIVE 06/27/2016 0932   HGBUR NEGATIVE 06/27/2016 0932   BILIRUBINUR NEGATIVE 06/27/2016 0932   KETONESUR NEGATIVE 06/27/2016 0932   PROTEINUR NEGATIVE 06/27/2016 0932   UROBILINOGEN 0.2 02/21/2012 0055   NITRITE NEGATIVE 06/27/2016 0932   LEUKOCYTESUR NEGATIVE 06/27/2016 0932   Sepsis Labs Invalid input(s): PROCALCITONIN,  WBC,  LACTICIDVEN Microbiology No results found for this or any previous visit (from the past 240 hour(s)).   Time coordinating discharge: Over 30 minutes  SIGNED:   Orvan Falconer, MD FACP [facility] 07/11/2016, 1:27 PM   If 7PM-7AM, please contact night-coverage [email or web address] Password [email or web address]

## 2016-07-11 NOTE — Progress Notes (Signed)
Pt discharged in stable condition via wheelchair into the care of her husband via private vehicle.  PIV removed intact and w/o stable complications.  Discharge instructions reviewed with pt.  Pt verbalized understanding.  Prescription given to pt.

## 2016-11-27 IMAGING — DX DG CHEST 2V
2 series · 2 of 2 positions shown · non-contrast
Comparison: Multiple priors dating back to [id number].

CLINICAL DATA: Short of breath.  Productive cough for 1 week.

EXAM:
CHEST  2 VIEW

[chest pa]
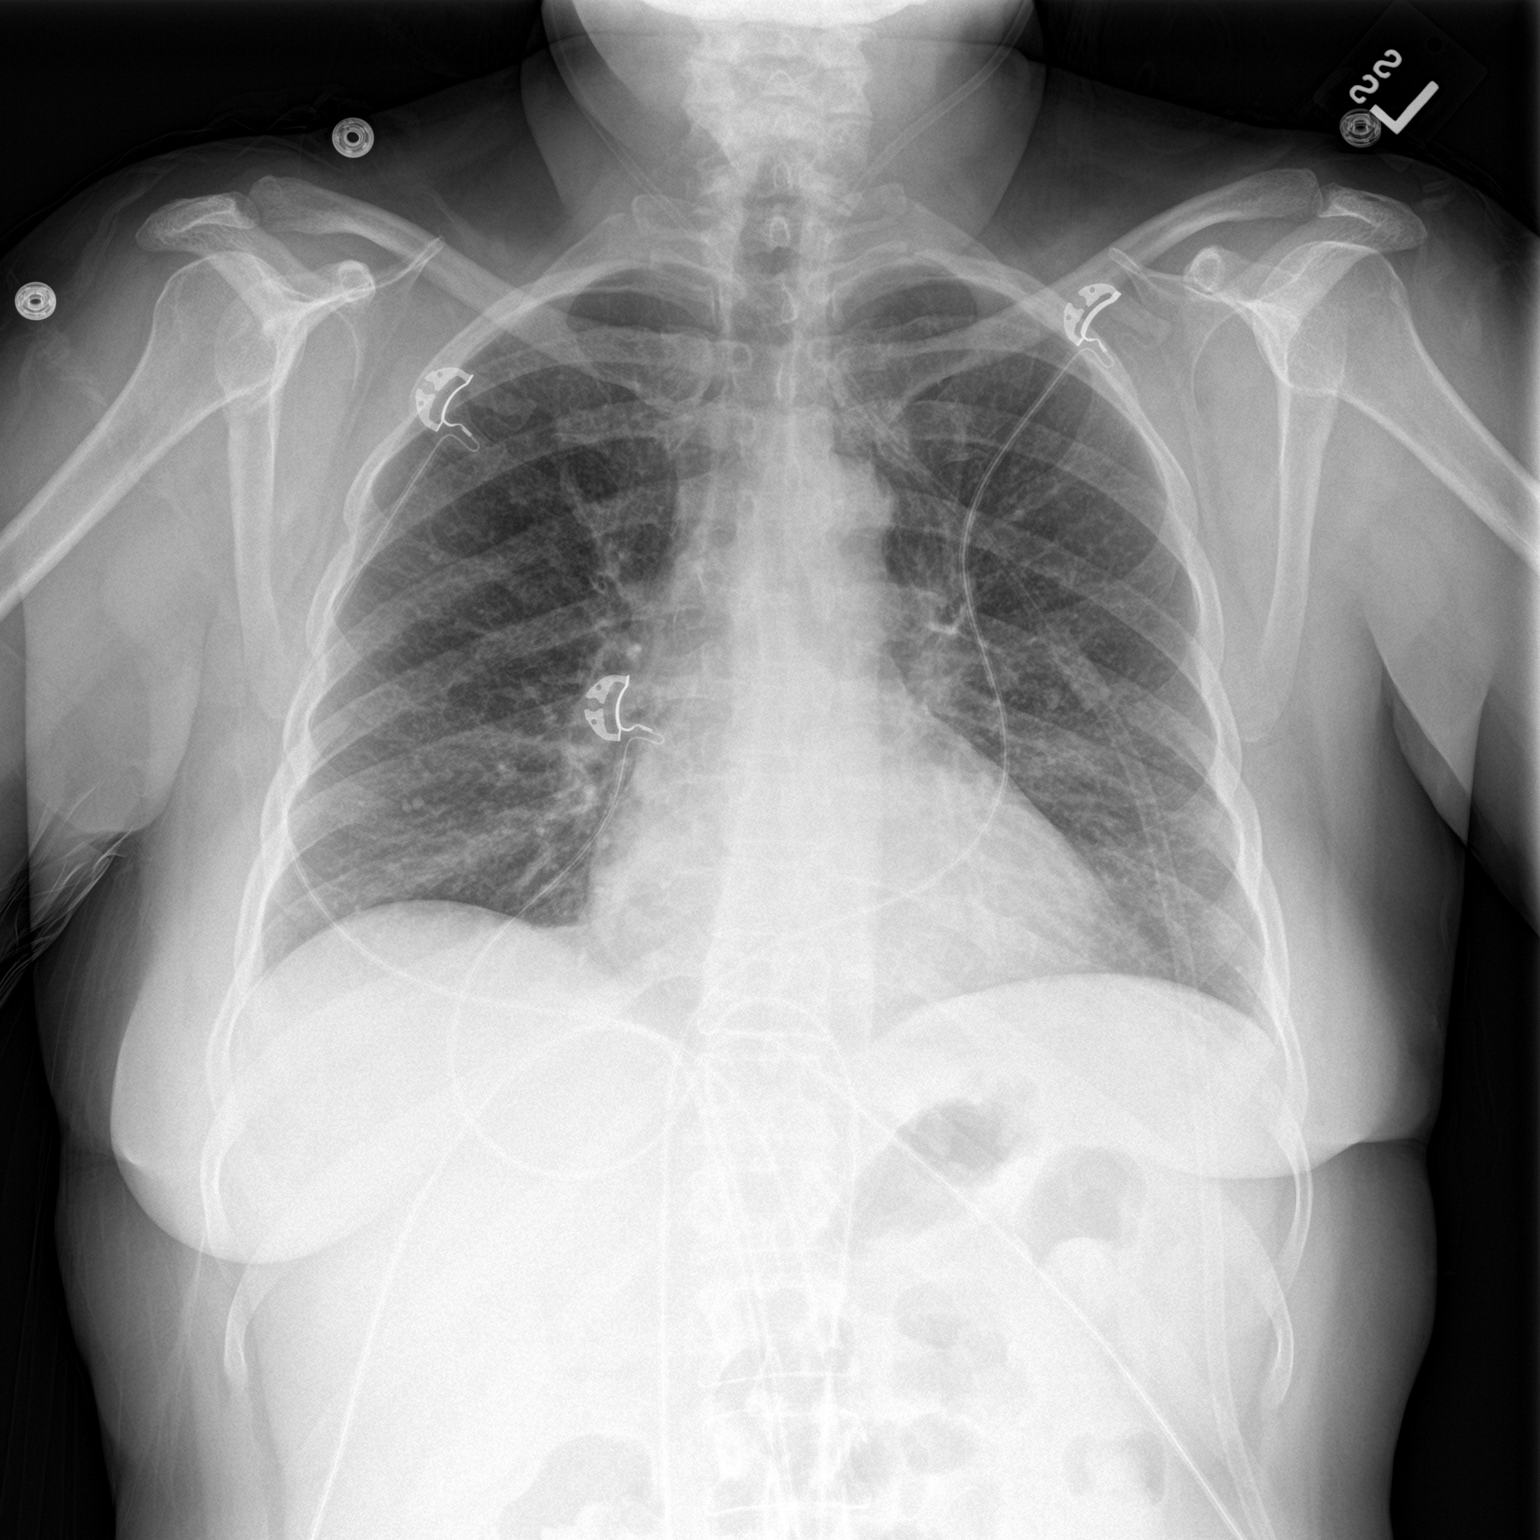

[chest lat]
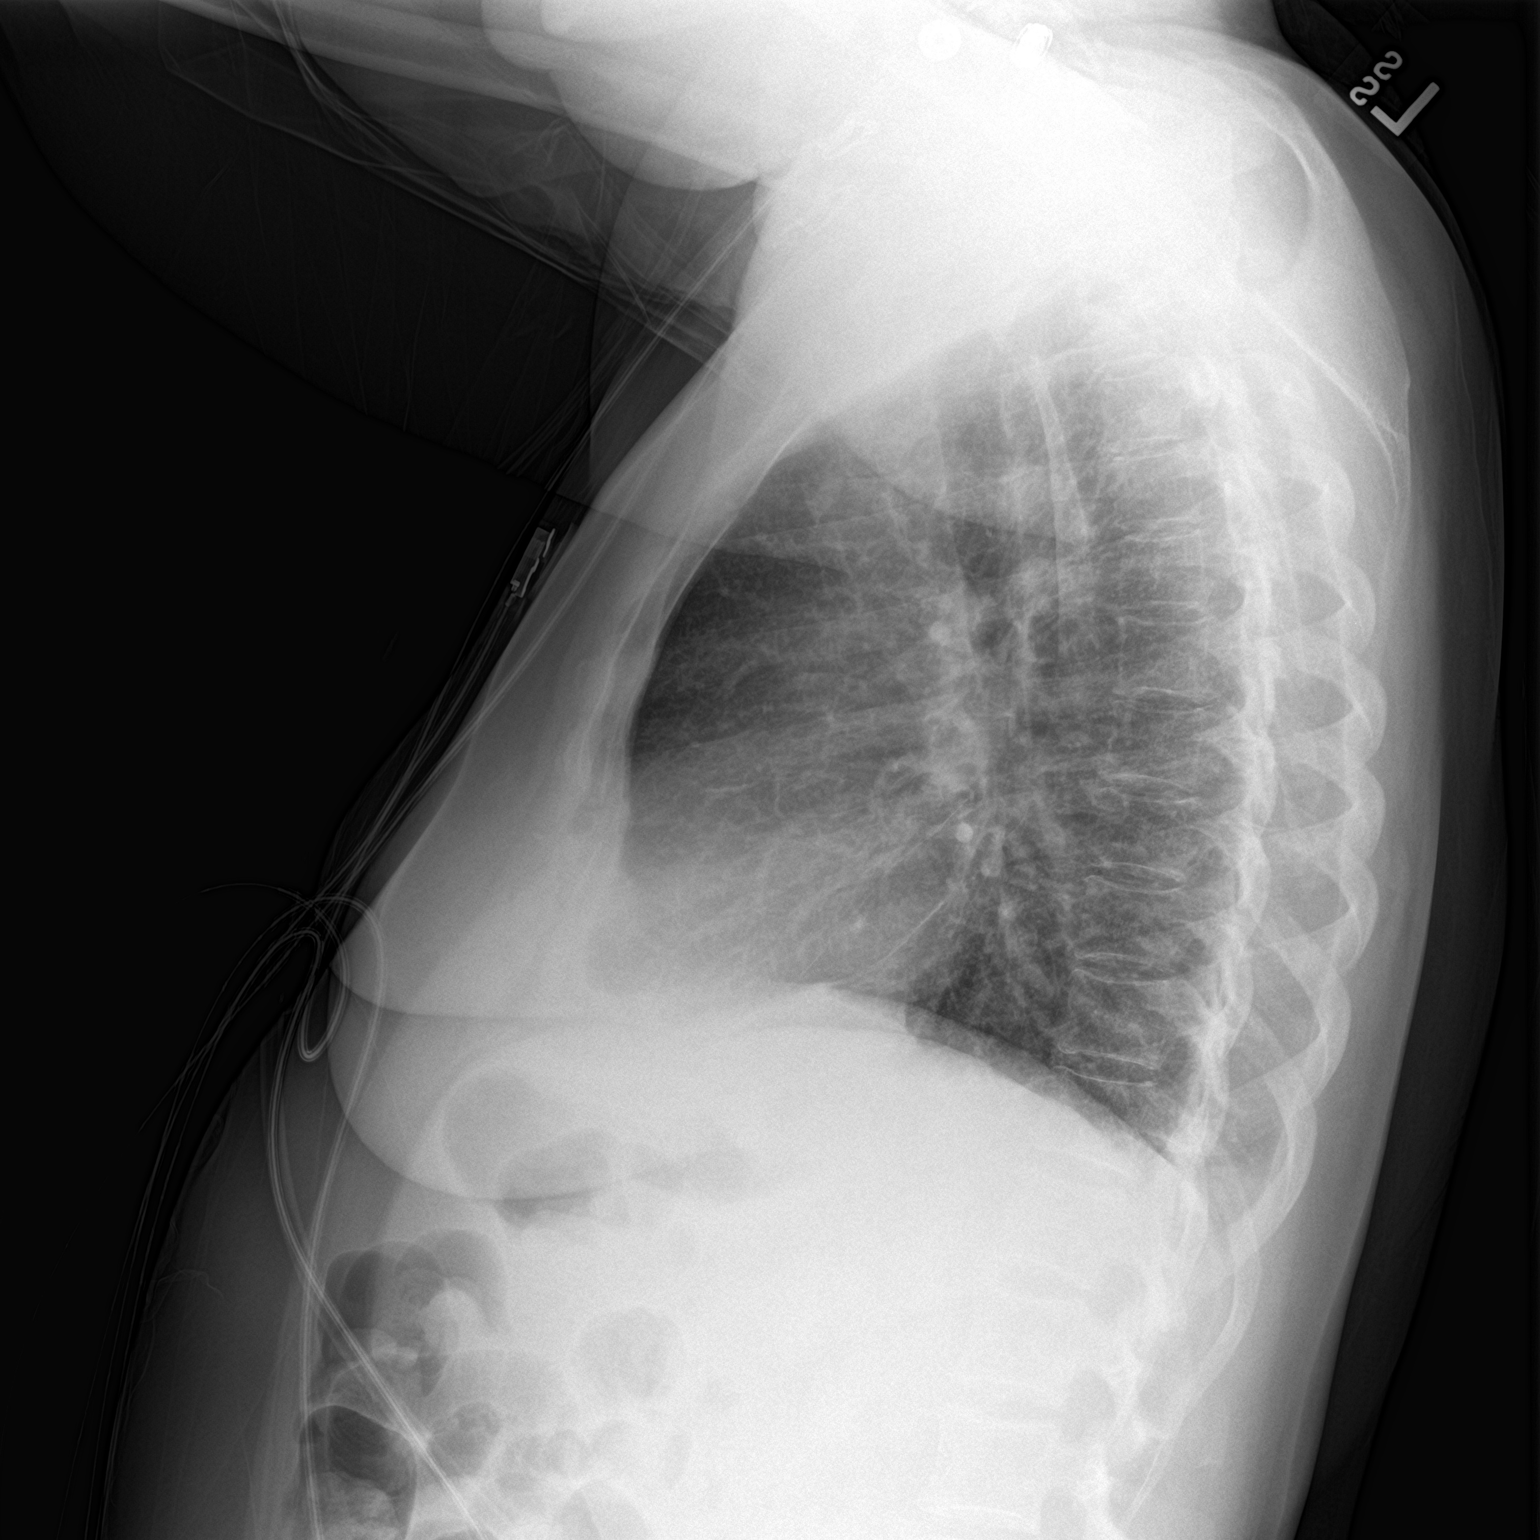

[2 of 2 positions shown; findings below may reference images not displayed]

FINDINGS: Cardiopericardial silhouette within normal limits. Monitoring leads
project over the chest.

No focal consolidation or pleural effusion. There is diffuse
interstitial prominence. This can be associated with interstitial
pulmonary edema. In a patient with cough, the findings can also be
associated with atypical infection such as mycoplasma pneumonia.
IMPRESSION: New diffuse interstitial prominence of the lungs, which may
represent interstitial pulmonary edema or mycoplasma pneumonia in a
patient with cough.

## 2017-02-02 ENCOUNTER — Telehealth (HOSPITAL_COMMUNITY): Payer: Self-pay | Admitting: *Deleted

## 2017-02-02 NOTE — Telephone Encounter (Signed)
left voice message regarding appointment. 

## 2017-02-20 DIAGNOSIS — M1710 Unilateral primary osteoarthritis, unspecified knee: Secondary | ICD-10-CM | POA: Insufficient documentation

## 2017-04-21 ENCOUNTER — Encounter: Payer: Self-pay | Admitting: Internal Medicine

## 2017-05-23 HISTORY — PX: KNEE ARTHROPLASTY: SHX992

## 2017-06-12 ENCOUNTER — Ambulatory Visit: Payer: Self-pay | Admitting: Nurse Practitioner

## 2017-06-12 ENCOUNTER — Telehealth: Payer: Self-pay | Admitting: Nurse Practitioner

## 2017-06-12 ENCOUNTER — Encounter: Payer: Self-pay | Admitting: Internal Medicine

## 2017-06-12 NOTE — Telephone Encounter (Signed)
PATIENT WAS A NO SHOW AND LETTER SENT  °

## 2017-06-13 NOTE — Telephone Encounter (Signed)
Noted  

## 2017-06-28 DIAGNOSIS — Z96651 Presence of right artificial knee joint: Secondary | ICD-10-CM | POA: Insufficient documentation

## 2017-06-28 DIAGNOSIS — Z96652 Presence of left artificial knee joint: Secondary | ICD-10-CM | POA: Insufficient documentation

## 2017-07-18 ENCOUNTER — Telehealth (HOSPITAL_COMMUNITY): Payer: Self-pay | Admitting: Family Medicine

## 2017-07-18 NOTE — Telephone Encounter (Signed)
07/18/17  I have called Dr. Maurine Cane office twice today to get the referral for therapy.  I spoke to [place] both times.  The first time I called this morning she said "she knows about it and is getting ready to send".  I called back at 2:40 still didn't have the order and Claiborne Billings said that "she knows about it and will send it.  We are in clinical right now".  I explained to kelly that if we didn't have the referral we would have to reschedule the patient.  I said I have called twice today asking for it... Claiborne Billings said "yea I know you called this morning and you are calling now."  Claiborne Billings said that they would send  It.

## 2017-07-19 ENCOUNTER — Other Ambulatory Visit: Payer: Self-pay

## 2017-07-19 ENCOUNTER — Encounter (HOSPITAL_COMMUNITY): Payer: Self-pay | Admitting: Physical Therapy

## 2017-07-19 ENCOUNTER — Ambulatory Visit (HOSPITAL_COMMUNITY): Payer: Medicaid Other | Attending: Family Medicine | Admitting: Physical Therapy

## 2017-07-19 DIAGNOSIS — M25661 Stiffness of right knee, not elsewhere classified: Secondary | ICD-10-CM | POA: Insufficient documentation

## 2017-07-19 DIAGNOSIS — M6281 Muscle weakness (generalized): Secondary | ICD-10-CM | POA: Insufficient documentation

## 2017-07-19 DIAGNOSIS — R262 Difficulty in walking, not elsewhere classified: Secondary | ICD-10-CM | POA: Diagnosis present

## 2017-07-19 DIAGNOSIS — M25561 Pain in right knee: Secondary | ICD-10-CM | POA: Diagnosis present

## 2017-07-19 NOTE — Therapy (Addendum)
[facility] [place], [place], [address] Phone: [phone]   Fax:  [phone]  Physical Therapy Evaluation  Patient Details  Name: Theresa Pace MRN: [id number] Date of Birth: 02/03/1961 Referring Provider: Remo Lipps Case   Encounter Date: 07/19/2017  PT End of Session - 07/19/17 1502    Visit Number  1    Number of Visits  18    Date for PT Re-Evaluation  08/18/17    Authorization Type  medicaid    Authorization - Visit Number  1    Authorization - Number of Visits  18    PT Start Time  6606    PT Stop Time  1555    PT Time Calculation (min)  40 min    Activity Tolerance  Patient tolerated treatment well    Behavior During Therapy  [facility] for tasks assessed/performed       Past Medical History:  Diagnosis Date  . Arthritis   . Asthma   . COPD (chronic obstructive pulmonary disease) ([place])   . Depression   . Fibromyalgia   . Frequent urination at night   . GERD (gastroesophageal reflux disease)   . Hypercholesteremia   . IBS (irritable bowel syndrome)   . Tendonitis     Past Surgical History:  Procedure Laterality Date  . ABDOMINAL HYSTERECTOMY    . APPENDECTOMY      There were no vitals filed for this visit.   Subjective Assessment - 07/19/17 1526    Subjective  Theresa Pace states that she had a TKR on her right knee on 06/20/2017.  She had home health end on 06/13/2017.  She is having significant pain, difficulty walking and having difficulty sleeping therefore she is being referred to outpatient therapy.      Pertinent History  COPD< HTN, anxiety     Limitations  Sitting;Standing;Walking;House hold activities    How long can you sit comfortably?  less than five minutes     How long can you stand comfortably?  stands with weight on left leg only.  Less than five minutes     How long can you walk comfortably?  uses a rolling walker for 10 minutes.      Patient Stated Goals  sleep better, less pain     Currently  in Pain?  Yes    Pain Score  8     Pain Location  Knee    Pain Orientation  Right    Pain Descriptors / Indicators  Aching;Throbbing;Tightness    Pain Type  Acute pain    Pain Onset  1 to 4 weeks ago    Pain Frequency  Constant    Aggravating Factors   bending     Pain Relieving Factors  ice, medication     Effect of Pain on Daily Activities  limits          [facility] PT Assessment - 07/19/17 0001      Assessment   Medical Diagnosis  Rt TKR    Referring Provider  Remo Lipps Case    Onset Date/Surgical Date  06/20/17    Next MD Visit  07/30/2017    Prior Therapy  HH      Precautions   Precautions  Fall      Restrictions   Weight Bearing Restrictions  No      Balance Screen   Has the patient fallen in the past 6 months  Yes    How many times?  10    Has the patient had a decrease in activity level because of a fear of falling?   Yes    Is the patient reluctant to leave their home because of a fear of falling?   No      Home Environment   Living Environment  Private residence    Type of [place] to enter    Entrance Stairs-Number of Steps  2    [place]  One level      Prior Function   Level of Independence  Independent    Vocation  On disability    Leisure  scrap book, gardening and relay for life        Cognition   Overall Cognitive Status  Within Functional Limits for tasks assessed      Observation/Other Assessments   Focus on Therapeutic Outcomes (FOTO)   27      Functional Tests   Functional tests  Sit to Stand;Single leg stance      Single Leg Stance   Comments  LT  15; RT 0       Sit to Stand   Comments  5 x no hands 12.30       ROM / Strength   AROM / PROM / Strength  Strength;AROM      AROM   AROM Assessment Site  Knee    Right/Left Knee  Right    Right Knee Extension  25    Right Knee Flexion  98      Strength   Strength Assessment Site  Hip;Knee;Ankle    Right/Left Hip  Right;Left    Right Hip Flexion  3+/5     Right Hip Extension  3/5    Right Hip ABduction  3+/5    Left Hip Flexion  4/5    Left Hip Extension  3/5    Right/Left Knee  Right;Left    Right Knee Flexion  5/5    Right Knee Extension  3/5    Left Knee Flexion  5/5    Left Knee Extension  4/5    Right/Left Ankle  Right;Left    Right Ankle Dorsiflexion  4/5    Left Ankle Dorsiflexion  5/5             Objective measurements completed on examination: See above findings.      [facility] Adult PT Treatment/Exercise - 07/19/17 0001      Exercises   Exercises  Knee/Hip      Knee/Hip Exercises: Standing   Heel Raises  10 reps    Functional Squat  10 reps      Knee/Hip Exercises: Seated   Long Arc Quad  5 reps      Knee/Hip Exercises: Supine   Quad Sets  5 reps    Heel Slides  5 reps      Knee/Hip Exercises: Sidelying   Hip ABduction  5 reps             PT Education - 07/19/17 1606    Education provided  Yes    Education Details  HEP    Person(s) Educated  Patient    Methods  Explanation;Handout    Comprehension  Verbalized understanding       PT Short Term Goals - 07/19/17 1554      PT SHORT TERM GOAL #1   Title  Pt pain to be no greater than 5/10 to allow pt to sleep  for four hours straight.     Baseline  7-9    Time  3    Period  Weeks    Status  New    Target Date  08/14/17      PT SHORT TERM GOAL #2   Title  PT right knee extension to be no greater than 12 for a more normalized gait pattern     Baseline  25    Time  3    Period  Weeks    Status  New      PT SHORT TERM GOAL #3   Title  Pt knee flexion to be to 105 to allow pt to sit in comfort for 30 minutes to allow comfort in  car riding and eating meals for 30 minutes.     Time  3    Period  Weeks      PT SHORT TERM GOAL #4   Title  Pt to be walking with a cane in her home     Baseline  using a walker     Time  3    Period  Weeks    Status  New      PT SHORT TERM GOAL #5   Title  Pt muscular strength to be increased one half  grade to allow pt to get on and off the commode without difficulty     Time  3    Period  Weeks    Status  New        PT Long Term Goals - 07/19/17 1558      PT LONG TERM GOAL #1   Title  PT pain in her right knee to be no greater than a 2/10 to allow pt to sleep for at least 6 hours straight.     Time  6    Period  Weeks    Status  New    Target Date  09/04/17      PT LONG TERM GOAL #2   Title  PT right knee extension to be four degrees or less to allow a normalize gait pattern     Time  6    Period  Weeks    Status  New      PT LONG TERM GOAL #3   Title  PT right knee flexion to be to 120 to allow pt to sit for an hour in comfort for going out to eat and traveling in the car.     Time  6    Period  Weeks    Status  New      PT LONG TERM GOAL #4   Title  PT to be walking without an assistive device in her home and with a cane outside.     Time  6    Period  Weeks    Status  New      PT LONG TERM GOAL #5   Title  Pt single leg stance to be 30 seconds bilaterally to reduce risk of falls, (pt to have no falls in the past 3 weeks. )    Time  6    Period  Weeks    Status  New      PT LONG TERM GOAL #6   Title  PT strength in her right LE to be improved one grade to allow pt to go up and down steps in a reciprocal manner.     Time  6    Period  Weeks  Status  New             Plan - 07/19/17 1502    Clinical Impression Statement  Ms. Vanderheiden is a 56 yo female who has had a recent right total knee.  She is being referred to skilled physcial therpy to improve her mobility.  Evaluation demonstrates decreased ROM, decresed strength, decreased activity tolerance, decreased balance, increased pain, increased edema  and difficulty in walking .  TheresaCaras will benefit from skilled physical therapy to address these issues and maximize her functional ability.    History and Personal Factors relevant to plan of care:  fibromyalgia,   (    Clinical Presentation  Stable       Clinical Decision Making  Low  (   Rehab Potential  Good     PT Frequency  3x / week     PT Duration  6 weeks    PT Treatment/Interventions  ADLs/Self Care Home Management;Cryotherapy;Gait training;Stair training;Functional mobility training;Therapeutic activities;Therapeutic exercise;Balance training;Patient/family education;Manual techniques       PT Next Visit Plan  begin rockerboard, standing knee flexion, terminal extension and step ups.      PT Home Exercise Plan  heel raises, minisquats, LAQ, Qset, heelslides   (Pended)     Consulted and Agree with Plan of Care  Patient         Patient will benefit from skilled therapeutic intervention in order to improve the following deficits and impairments:  (P) Abnormal gait, Pain, Decreased mobility, Decreased activity tolerance, Decreased range of motion, Decreased strength, Increased edema, Impaired flexibility, Difficulty walking, Decreased balance  Visit Diagnosis: Acute pain of right knee - Plan: PT plan of care cert/re-cert  Stiffness of right knee, not elsewhere classified - Plan: PT plan of care cert/re-cert  Difficulty in walking - Plan: PT plan of care cert/re-cert  Muscle weakness (generalized) - Plan: PT plan of care cert/re-cert     Problem List Patient Active Problem List   Diagnosis Date Noted  . Acute respiratory distress 07/07/2016  . Asthma exacerbation 07/07/2016  . Sinus tachycardia 10/22/2015  . Acute exacerbation of chronic obstructive pulmonary disease (COPD) ([place]) 10/21/2015  . Anxiety 10/21/2015  . GERD (gastroesophageal reflux disease) 10/21/2015  . Chronic pain 10/21/2015  . Hyperlipidemia 10/21/2015  . Acute respiratory failure with hypoxia ([place]) 02/18/2015  . COPD (chronic obstructive pulmonary disease) ([place]) 02/16/2015  . COPD exacerbation ([place]) 02/16/2015  . Mycoplasma pneumonia 02/16/2015  . Seizure ([place]) 02/22/2012  . Bleeding hemorrhoid 02/22/2012  . Acute encephalopathy 02/21/2012  . Asthma  02/21/2012  . Depression 02/21/2012   Rayetta Humphrey, PT CLT [phone] 07/19/2017, 4:11 PM  [facility] [address] [place], [place], [address] Phone: [phone]   Fax:  [phone]  Name: Theresa Pace MRN: [id number] Date of Birth: 12-Nov-1960

## 2017-07-19 NOTE — Patient Instructions (Addendum)
Knee Extension (Sitting)    Place ____ pound weight on right ankle and straighten knee fully, lower slowly. Repeat ____ times per set. Do ____ sets per session. Do ____ sessions per day.  http://orth.exer.us/732   Copyright  VHI. All rights reserved.  Strengthening: Quadriceps Set    Tighten muscles on top of thighs by pushing knees down into surface. Hold ___5_ seconds. Repeat __10__ times per set. Do __1__ sets per session. Do __2_ sessions per day.  http://orth.exer.us/602   Copyright  VHI. All rights reserved.  Self-Mobilization: Heel Slide (Supine)    Slide right  heel toward buttocks until a gentle stretch is felt. Hold __5__ seconds. Relax. Repeat __10__ times per set. Do ___1_ sets per session. Do 2____ sessions per day.  http://orth.exer.us/710   Copyright  VHI. All rights reserved.  Stretching: Hamstring (Supine)    Supporting right thigh behind knee, slowly straighten knee until stretch is felt in back of thigh. Hold _30___ seconds. Repeat __3__ times per set. Do 1____ sets per session. Do __2__ sessions per day.  http://orth.exer.us/656   Copyright  VHI. All rights reserved.  Heel Raise: Bilateral (Standing)   At your kitchen counter Rise on balls of feet. Repeat _10___ times per set. Do __1__ sets per session. Do ___2_ sessions per day.  http://orth.exer.us/38   Copyright  VHI. All rights reserved.  Functional Quadriceps: Chair Squat    Keeping feet flat on floor, shoulder width apart, squat as low as is comfortable. Use support as necessary. Repeat _10___ times per set. Do ____1 sets per session. Do _2___ sessions per day.  http://orth.exer.us/736   Copyright  VHI. All rights reserved.  Strengthening: Straight Leg Raise (Phase 1)    Tighten muscles on front of right thigh, then lift leg _18___ inches from surface, keeping knee locked.  Repeat __10__ times per set. Do _1___ sets per session. Do _2___ sessions per  day.  http://orth.exer.us/614   Copyright  VHI. All rights reserved.

## 2017-07-24 ENCOUNTER — Telehealth (HOSPITAL_COMMUNITY): Payer: Self-pay

## 2017-07-24 ENCOUNTER — Telehealth (HOSPITAL_COMMUNITY): Payer: Self-pay | Admitting: Family Medicine

## 2017-07-24 ENCOUNTER — Ambulatory Visit (HOSPITAL_COMMUNITY): Payer: Medicaid Other

## 2017-07-24 NOTE — Telephone Encounter (Signed)
07/24/17  pt left a message to cx said that she was having breathing problems

## 2017-07-26 ENCOUNTER — Encounter (HOSPITAL_COMMUNITY): Payer: Self-pay

## 2017-07-26 ENCOUNTER — Ambulatory Visit (HOSPITAL_COMMUNITY): Payer: Medicaid Other | Attending: Family Medicine

## 2017-07-26 DIAGNOSIS — M25661 Stiffness of right knee, not elsewhere classified: Secondary | ICD-10-CM | POA: Diagnosis present

## 2017-07-26 DIAGNOSIS — M6281 Muscle weakness (generalized): Secondary | ICD-10-CM | POA: Insufficient documentation

## 2017-07-26 DIAGNOSIS — M25561 Pain in right knee: Secondary | ICD-10-CM | POA: Diagnosis not present

## 2017-07-26 DIAGNOSIS — R262 Difficulty in walking, not elsewhere classified: Secondary | ICD-10-CM | POA: Insufficient documentation

## 2017-07-26 NOTE — Therapy (Signed)
[facility] [place], [place], [address] Phone: [phone]   Fax:  [phone]  Physical Therapy Treatment  Patient Details  Name: Theresa Pace MRN: [id number] Date of Birth: 05-Dec-1960 Referring Provider: Remo Lipps Case   Encounter Date: 07/26/2017  PT End of Session - 07/26/17 1611    Visit Number  2    Number of Visits  18    Date for PT Re-Evaluation  08/18/17    Authorization Type  medicaid- 3 tx approved through 08/06/17    Authorization - Visit Number  2    Authorization - Number of Visits  18    PT Start Time  2423    PT Stop Time  5361    PT Time Calculation (min)  39 min    Activity Tolerance  Patient tolerated treatment well;No increased pain    Behavior During Therapy  WFL for tasks assessed/performed       Past Medical History:  Diagnosis Date  . Arthritis   . Asthma   . COPD (chronic obstructive pulmonary disease) ([place])   . Depression   . Fibromyalgia   . Frequent urination at night   . GERD (gastroesophageal reflux disease)   . Hypercholesteremia   . IBS (irritable bowel syndrome)   . Tendonitis     Past Surgical History:  Procedure Laterality Date  . ABDOMINAL HYSTERECTOMY    . APPENDECTOMY      There were no vitals filed for this visit.  Subjective Assessment - 07/26/17 1609    Subjective  Pt stated she had difficulty breathing with her COPD over weekend.  Reports her knee is very stiff the last couple days.  Reports compliance wiht HEP daily    Pertinent History  COPD< HTN, anxiety     Patient Stated Goals  sleep better, less pain     Currently in Pain?  Yes    Pain Score  7     Pain Location  Knee    Pain Orientation  Right    Pain Descriptors / Indicators  Tightness;Sharp    Pain Onset  1 to 4 weeks ago    Pain Frequency  Intermittent    Aggravating Factors   bending,     Pain Relieving Factors  ice, medication.    Effect of Pain on Daily Activities  increases                       [facility] Adult PT Treatment/Exercise - 07/26/17 0001      Knee/Hip Exercises: Stretches   Active Hamstring Stretch  3 reps;30 seconds supine      Knee/Hip Exercises: Standing   Heel Raises  10 reps    Terminal Knee Extension  10 reps;Theraband    Theraband Level (Terminal Knee Extension)  Level 3 (Green)    Functional Squat  10 reps    Rocker Board  2 minutes;Limitations    Rocker Board Limitations  lateral      Knee/Hip Exercises: Seated   Long Arc Quad  10 reps    Sit to [employer]  10 reps;without UE support      Knee/Hip Exercises: Supine   Quad Sets  10 reps    Short Arc Quad Sets  10 reps    Heel Slides  10 reps    Knee Extension  AROM;Limitations    Knee Extension Limitations  15    Knee Flexion  AROM;Limitations    Knee Flexion Limitations  117             PT Education - 07/26/17 1646    Education provided  Yes    Education Details  Reviewed goals, assured compliance and proper from with HEP and copy of eval given to pt.  Reviewed RICE application for pain and edema contol    Person(s) Educated  Patient    Methods  Explanation;Demonstration;Handout    Comprehension  Verbalized understanding;Returned demonstration       PT Short Term Goals - 07/19/17 1554      PT SHORT TERM GOAL #1   Title  Pt pain to be no greater than 5/10 to allow pt to sleep for four hours straight.     Baseline  7-9    Time  3    Period  Weeks    Status  New    Target Date  08/14/17      PT SHORT TERM GOAL #2   Title  PT right knee extension to be no greater than 12 for a more normalized gait pattern     Baseline  25    Time  3    Period  Weeks    Status  New      PT SHORT TERM GOAL #3   Title  Pt knee flexion to be to 105 to allow pt to sit in comfort for 30 minutes to allow comfort in  car riding and eating meals for 30 minutes.     Time  3    Period  Weeks      PT SHORT TERM GOAL #4   Title  Pt to be walking with a cane in her home      Baseline  using a walker     Time  3    Period  Weeks    Status  New      PT SHORT TERM GOAL #5   Title  Pt muscular strength to be increased one half grade to allow pt to get on and off the commode without difficulty     Time  3    Period  Weeks    Status  New        PT Long Term Goals - 07/19/17 1558      PT LONG TERM GOAL #1   Title  PT pain in her right knee to be no greater than a 2/10 to allow pt to sleep for at least 6 hours straight.     Time  6    Period  Weeks    Status  New    Target Date  09/04/17      PT LONG TERM GOAL #2   Title  PT right knee extension to be four degrees or less to allow a normalize gait pattern     Time  6    Period  Weeks    Status  New      PT LONG TERM GOAL #3   Title  PT right knee flexion to be to 120 to allow pt to sit for an hour in comfort for going out to eat and traveling in the car.     Time  6    Period  Weeks    Status  New      PT LONG TERM GOAL #4   Title  PT to be walking without an assistive device in her home and with a cane outside.     Time  6  Period  Weeks    Status  New      PT LONG TERM GOAL #5   Title  Pt single leg stance to be 30 seconds bilaterally to reduce risk of falls, (pt to have no falls in the past 3 weeks. )    Time  6    Period  Weeks    Status  New      PT LONG TERM GOAL #6   Title  PT strength in her right LE to be improved one grade to allow pt to go up and down steps in a reciprocal manner.     Time  6    Period  Weeks    Status  New            Plan - 07/26/17 1623    Clinical Impression Statement  Reviewed goals, assured compliance and proper form with HEP and copy of eval given to pt.  Session focus on knee mobility to improve gait mechanics and pain control.  Therex focus on quad strengthening and stretches to improve mobility.  Pt able to demonstrate appropriate mechanics with all exercises with minimal cueing to increase hold time wiht quad sets and end range flexion with heel  slides.  Improved knee AROM 15-117 degrees.  Began rockerboard and TKE to improve knee extension and weight distrubtion with gait.  No reports of increased pain through session.      History and Personal Factors relevant to plan of care:  fibromyalgia,    Clinical Presentation  Stable    Clinical Decision Making  Low    Rehab Potential  Good    PT Frequency  3x / week    PT Duration  6 weeks    PT Treatment/Interventions  ADLs/Self Care Home Management;Cryotherapy;Gait training;Stair training;Functional mobility training;Therapeutic activities;Therapeutic exercise;Balance training;Patient/family education;Manual techniques    PT Next Visit Plan  Continue to progress quad strengthening for extension.  May need to add manual for edema control next session.  COntinues with rockerboard and standing TKE.  Begin standing knee flexion next session and progress step ups.    PT Home Exercise Plan  heel raises, minisquats, LAQ, Qset, heelslides        Patient will benefit from skilled therapeutic intervention in order to improve the following deficits and impairments:  Abnormal gait, Pain, Decreased mobility, Decreased activity tolerance, Decreased range of motion, Decreased strength, Increased edema, Impaired flexibility, Difficulty walking, Decreased balance  Visit Diagnosis: Acute pain of right knee  Stiffness of right knee, not elsewhere classified  Difficulty in walking  Muscle weakness (generalized)     Problem List Patient Active Problem List   Diagnosis Date Noted  . Acute respiratory distress 07/07/2016  . Asthma exacerbation 07/07/2016  . Sinus tachycardia 10/22/2015  . Acute exacerbation of chronic obstructive pulmonary disease (COPD) ([place]) 10/21/2015  . Anxiety 10/21/2015  . GERD (gastroesophageal reflux disease) 10/21/2015  . Chronic pain 10/21/2015  . Hyperlipidemia 10/21/2015  . Acute respiratory failure with hypoxia ([place]) 02/18/2015  . COPD (chronic obstructive pulmonary  disease) ([place]) 02/16/2015  . COPD exacerbation ([place]) 02/16/2015  . Mycoplasma pneumonia 02/16/2015  . Seizure ([place]) 02/22/2012  . Bleeding hemorrhoid 02/22/2012  . Acute encephalopathy 02/21/2012  . Asthma 02/21/2012  . Depression 02/21/2012   Ihor Austin, [place]; [place]  Aldona Lento 07/26/2017, 5:04 PM  [facility] [address] [place], [place], [address] Phone: [phone]   Fax:  [phone]  Name: GEORGEANNE FRANKLAND  MRN: [id number] Date of Birth: 01-15-1961

## 2017-07-27 ENCOUNTER — Telehealth (HOSPITAL_COMMUNITY): Payer: Self-pay | Admitting: Physical Therapy

## 2017-07-27 NOTE — Telephone Encounter (Signed)
CR requested to cx these due to needing ins approval from [place] agreed and understand she can continue on the 18 of March.

## 2017-07-31 ENCOUNTER — Encounter (HOSPITAL_COMMUNITY): Payer: Self-pay | Admitting: Physical Therapy

## 2017-08-02 ENCOUNTER — Ambulatory Visit (HOSPITAL_COMMUNITY): Payer: Medicaid Other | Admitting: Physical Therapy

## 2017-08-02 DIAGNOSIS — R262 Difficulty in walking, not elsewhere classified: Secondary | ICD-10-CM

## 2017-08-02 DIAGNOSIS — M6281 Muscle weakness (generalized): Secondary | ICD-10-CM

## 2017-08-02 DIAGNOSIS — M25561 Pain in right knee: Secondary | ICD-10-CM

## 2017-08-02 DIAGNOSIS — M25661 Stiffness of right knee, not elsewhere classified: Secondary | ICD-10-CM

## 2017-08-02 NOTE — Therapy (Signed)
[place], [place], [address] Phone: [phone]   Fax:  [phone]  Physical Therapy Treatment  Patient Details  Name: Theresa Pace MRN: [id number] Date of Birth: 04/22/1961 Referring Provider: Remo Lipps Case   Encounter Date: 08/02/2017  PT End of Session - 08/02/17 1543    Visit Number  3    Number of Visits  18    Date for PT Re-Evaluation  08/18/17    Authorization Type  medicaid- 3 tx approved through 08/06/17; medicaid resubmitted by CR 3/13    Authorization - Visit Number  3    Authorization - Number of Visits  18    PT Start Time  6433    PT Stop Time  1730    PT Time Calculation (min)  42 min    Activity Tolerance  Patient tolerated treatment well;No increased pain    Behavior During Therapy  WFL for tasks assessed/performed       Past Medical History:  Diagnosis Date  . Arthritis   . Asthma   . COPD (chronic obstructive pulmonary disease) ([place])   . Depression   . Fibromyalgia   . Frequent urination at night   . GERD (gastroesophageal reflux disease)   . Hypercholesteremia   . IBS (irritable bowel syndrome)   . Tendonitis     Past Surgical History:  Procedure Laterality Date  . ABDOMINAL HYSTERECTOMY    . APPENDECTOMY      There were no vitals filed for this visit.  Subjective Assessment - 08/02/17 1352    Subjective  PT reports pain Rt knee front and back, 8/10.    States her Lt knee is also hurting.  States a scab fell off her incision and now she has a "hole"there with clear drainage.   comes today carrying her walker stating, "its so loud thats why im carrying it".    Currently in Pain?  Yes    Pain Score  8     Pain Location  Knee    Pain Orientation  Right    Pain Descriptors / Indicators  Sore;Tightness                      [facility] Adult PT Treatment/Exercise - 08/02/17 0001      Knee/Hip Exercises: Stretches   Active Hamstring Stretch  3 reps;30 seconds    Gastroc Stretch  Both;3 reps;20 seconds;Limitations    Gastroc Stretch Limitations  slant board      Knee/Hip Exercises: Standing   Heel Raises  15 reps;Limitations    Heel Raises Limitations  toeraises 10 reps    Terminal Knee Extension  10 reps;Theraband    Theraband Level (Terminal Knee Extension)  Level 3 (Green)    Functional Squat  15 reps with manual cues for form    Rocker Board  2 minutes;Limitations    Rocker Board Limitations  lateral      Knee/Hip Exercises: Seated   Long Arc Quad  15 reps;Right    Sit to [employer]  10 reps;without UE support      Knee/Hip Exercises: Supine   Quad Sets  10 reps    Heel Slides  10 reps    Knee Extension  AROM;Limitations    Knee Extension Limitations  15    Knee Flexion  AROM;Limitations    Knee Flexion Limitations  115      Knee/Hip Exercises: Prone   Prone Knee Hang  3 minutes;Limitations  Prone Knee Hang Limitations  STM to posterior knee      Manual Therapy   Manual Therapy  Soft tissue mobilization;Myofascial release    Manual therapy comments  supine and prone to anteriior and posterior knee; completed seperately from all other skilled interventions    Soft tissue mobilization  to Rt knee to decrease restrictons    Myofascial Release  to decrease adhesions               PT Short Term Goals - 07/19/17 1554      PT SHORT TERM GOAL #1   Title  Pt pain to be no greater than 5/10 to allow pt to sleep for four hours straight.     Baseline  7-9    Time  3    Period  Weeks    Status  New    Target Date  08/14/17      PT SHORT TERM GOAL #2   Title  PT right knee extension to be no greater than 12 for a more normalized gait pattern     Baseline  25    Time  3    Period  Weeks    Status  New      PT SHORT TERM GOAL #3   Title  Pt knee flexion to be to 105 to allow pt to sit in comfort for 30 minutes to allow comfort in  car riding and eating meals for 30 minutes.     Time  3    Period  Weeks      PT SHORT TERM GOAL  #4   Title  Pt to be walking with a cane in her home     Baseline  using a walker     Time  3    Period  Weeks    Status  New      PT SHORT TERM GOAL #5   Title  Pt muscular strength to be increased one half grade to allow pt to get on and off the commode without difficulty     Time  3    Period  Weeks    Status  New        PT Long Term Goals - 07/19/17 1558      PT LONG TERM GOAL #1   Title  PT pain in her right knee to be no greater than a 2/10 to allow pt to sleep for at least 6 hours straight.     Time  6    Period  Weeks    Status  New    Target Date  09/04/17      PT LONG TERM GOAL #2   Title  PT right knee extension to be four degrees or less to allow a normalize gait pattern     Time  6    Period  Weeks    Status  New      PT LONG TERM GOAL #3   Title  PT right knee flexion to be to 120 to allow pt to sit for an hour in comfort for going out to eat and traveling in the car.     Time  6    Period  Weeks    Status  New      PT LONG TERM GOAL #4   Title  PT to be walking without an assistive device in her home and with a cane outside.     Time  6  Period  Weeks    Status  New      PT LONG TERM GOAL #5   Title  Pt single leg stance to be 30 seconds bilaterally to reduce risk of falls, (pt to have no falls in the past 3 weeks. )    Time  6    Period  Weeks    Status  New      PT LONG TERM GOAL #6   Title  PT strength in her right LE to be improved one grade to allow pt to go up and down steps in a reciprocal manner.     Time  6    Period  Weeks    Status  New            Plan - 08/02/17 1705    Clinical Impression Statement  Continued with primary focus on extension and working on decreasing to LRAD with gait.  Able to provide tennis balls for walker to help reduce sound and improve safety with use.  Continued with stretches and functional strengthening to help improve stability and began ambulation using SPC today.  Manual completed at EOS to help  reduce tightness both posteriorly and anteriorly to knee.  PT only able to tolerate 3 minutes in prone to discomfort.   PT with open area proximal scar she states she noticed after her scab fell off that is draining serous fluid.  Area is approx 0.5X0.5cm and 0.5cm deep .  Pt encouraged to contact MD office to make him aware of this.  Also with another area mid scar that is still scabbed over but unsure if it is healed beneath.  PT with AROM today 15-117.  Encouraged to continue use of RW at home until becomes more comfortable with SPC.   No LOB or increased pain using cane and generally good sequencing, however general flow at this point is awkward.      Rehab Potential  Good    PT Frequency  3x / week    PT Duration  6 weeks    PT Treatment/Interventions  ADLs/Self Care Home Management;Cryotherapy;Gait training;Stair training;Functional mobility training;Therapeutic activities;Therapeutic exercise;Balance training;Patient/family education;Manual techniques    PT Next Visit Plan  Continue with manual to reduce edema  and improve soft tissue mobiltiy.  Begin standing knee flexion next session and continue ambulation with SPC.    PT Home Exercise Plan  heel raises, minisquats, LAQ, Qset, heelslides        Patient will benefit from skilled therapeutic intervention in order to improve the following deficits and impairments:  Abnormal gait, Pain, Decreased mobility, Decreased activity tolerance, Decreased range of motion, Decreased strength, Increased edema, Impaired flexibility, Difficulty walking, Decreased balance  Visit Diagnosis: Acute pain of right knee  Stiffness of right knee, not elsewhere classified  Difficulty in walking  Muscle weakness (generalized)     Problem List Patient Active Problem List   Diagnosis Date Noted  . Acute respiratory distress 07/07/2016  . Asthma exacerbation 07/07/2016  . Sinus tachycardia 10/22/2015  . Acute exacerbation of chronic obstructive pulmonary  disease (COPD) ([place]) 10/21/2015  . Anxiety 10/21/2015  . GERD (gastroesophageal reflux disease) 10/21/2015  . Chronic pain 10/21/2015  . Hyperlipidemia 10/21/2015  . Acute respiratory failure with hypoxia ([place]) 02/18/2015  . COPD (chronic obstructive pulmonary disease) ([place]) 02/16/2015  . COPD exacerbation ([place]) 02/16/2015  . Mycoplasma pneumonia 02/16/2015  . Seizure ([place]) 02/22/2012  . Bleeding hemorrhoid 02/22/2012  . Acute encephalopathy 02/21/2012  . Asthma 02/21/2012  .  Depression 02/21/2012   Teena Irani, PTA/CLT [phone]  Teena Irani 08/02/2017, 5:12 PM  [facility] [address] [place], [place], [address] Phone: [phone]   Fax:  [phone]  Name: Theresa Pace MRN: [id number] Date of Birth: 1960-06-13

## 2017-08-04 ENCOUNTER — Encounter (HOSPITAL_COMMUNITY): Payer: Self-pay | Admitting: Physical Therapy

## 2017-08-07 ENCOUNTER — Ambulatory Visit (HOSPITAL_COMMUNITY): Payer: Medicaid Other | Admitting: Physical Therapy

## 2017-08-07 ENCOUNTER — Telehealth (HOSPITAL_COMMUNITY): Payer: Self-pay | Admitting: Physical Therapy

## 2017-08-07 NOTE — Telephone Encounter (Signed)
Lack of transportation

## 2017-08-09 ENCOUNTER — Ambulatory Visit (HOSPITAL_COMMUNITY): Payer: Medicaid Other | Admitting: Physical Therapy

## 2017-08-09 ENCOUNTER — Telehealth (HOSPITAL_COMMUNITY): Payer: Self-pay | Admitting: Physical Therapy

## 2017-08-09 NOTE — Telephone Encounter (Signed)
Patient called to let us know she is still waiting on her medicaid card

## 2017-08-09 NOTE — Telephone Encounter (Signed)
Patient l/m seen MD and he wants her to cut PT to once a week and she will come in Friday. NF 08/09/17

## 2017-08-11 ENCOUNTER — Encounter (HOSPITAL_COMMUNITY): Payer: Self-pay | Admitting: Physical Therapy

## 2017-08-11 ENCOUNTER — Ambulatory Visit (HOSPITAL_COMMUNITY): Payer: Medicaid Other | Admitting: Physical Therapy

## 2017-08-11 DIAGNOSIS — M25561 Pain in right knee: Secondary | ICD-10-CM | POA: Diagnosis not present

## 2017-08-11 DIAGNOSIS — R262 Difficulty in walking, not elsewhere classified: Secondary | ICD-10-CM

## 2017-08-11 DIAGNOSIS — M25661 Stiffness of right knee, not elsewhere classified: Secondary | ICD-10-CM

## 2017-08-11 DIAGNOSIS — M6281 Muscle weakness (generalized): Secondary | ICD-10-CM

## 2017-08-11 NOTE — Patient Instructions (Addendum)
Strengthening: Terminal Knee Extension (Supine)    With right knee over bolster, straighten knee by tightening muscles on top of thigh. Keep bottom of knee on bolster. Repeat __10-15__ times per set. Do __1__ sets per session. Do _2___ sessions per day.  http://orth.exer.us/626   Copyright  VHI. All rights reserved.  Strengthening: Hip Abduction (Side-Lying)   1 Tighten muscles on front of left thigh, then lift leg _15___ inches from surface, keeping knee locked.  Repeat _10___ times per set. Do __1__ sets per session. Do __2__ sessions per day.  http://orth.exer.us/622   Copyright  VHI. All rights reserved.  Self-Mobilization: Knee Flexion (Prone)    Bring left heel toward buttocks as close as possible. Hold __3__ seconds. Relax. Repeat _10___ times per set. Do __1__ sets per session. Do __2__ sessions per day.  http://orth.exer.us/596   Copyright  VHI. All rights reserved.  Strengthening: Hip Extension (Prone)    Tighten muscles on front of left thigh, then lift leg _3___ inches from surface, keeping knee locked. Repeat __10__ times per set. Do __1__ sets per session. Do _2___ sessions per day.  http://orth.exer.us/620   Copyright  VHI. All rights reserved.

## 2017-08-11 NOTE — Therapy (Signed)
[place], [place], [address] Phone: [phone]   Fax:  [phone]  Physical Therapy Treatment  Patient Details  Name: Theresa Pace MRN: [id number] Date of Birth: [birth year] Referring Provider: Remo Lipps Case    Encounter Date: 08/11/2017  PT End of Session - 08/11/17 1605    Visit Number  4    Number of Visits  14    Date for PT Re-Evaluation  08/18/17    Authorization Type  medicaid approved thru 4/14     Authorization - Visit Number  3    Authorization - Number of Visits  14    PT Start Time  2671    PT Stop Time  1602    PT Time Calculation (min)  42 min    Activity Tolerance  Patient tolerated treatment well;No increased pain    Behavior During Therapy  WFL for tasks assessed/performed       Past Medical History:  Diagnosis Date  . Arthritis   . Asthma   . COPD (chronic obstructive pulmonary disease) ([place])   . Depression   . Fibromyalgia   . Frequent urination at night   . GERD (gastroesophageal reflux disease)   . Hypercholesteremia   . IBS (irritable bowel syndrome)   . Tendonitis     Past Surgical History:  Procedure Laterality Date  . ABDOMINAL HYSTERECTOMY    . APPENDECTOMY      There were no vitals filed for this visit.  Subjective Assessment - 08/11/17 1527    Subjective  Theresa Pace went to her MD on Wednesday.  The incision has not healed well therefore he wants her to decrease treatment to once a week     Pertinent History  COPD< HTN, anxiety     Limitations  Sitting;Standing;Walking;House hold activities    How long can you sit comfortably?  less than five minutes     How long can you stand comfortably?  stands with weight on left leg only.  Less than five minutes     How long can you walk comfortably?  uses a rolling walker for 10 minutes.      Patient Stated Goals  sleep better, less pain     Currently in Pain?  Yes    Pain Score  4  has taken pain medication     Pain  Location  Knee    Pain Onset  1 to 4 weeks ago         [facility] PT Assessment - 08/11/17 0001      Assessment   Medical Diagnosis  Rt TKR    Referring Provider  Remo Lipps Case     Onset Date/Surgical Date  06/20/17    Next MD Visit  07/30/2017    Prior Therapy  HH      Precautions   Precautions  Fall      Restrictions   Weight Bearing Restrictions  No      Home Environment   Living Environment  Private residence    Type of [place] to enter    Entrance Stairs-Number of Steps  2    [place]  One level      Prior Function   Level of Independence  Independent    Vocation  On disability    Leisure  scrap book, gardening and relay for life        Cognition   Overall Cognitive Status  Within Functional Limits for tasks assessed      Observation/Other Assessments   Focus on Therapeutic Outcomes (FOTO)   --      Functional Tests   Functional tests  --      Single Leg Stance   Comments  --      Sit to Stand   Comments  --      AROM   Right Knee Extension  14 was 25    Right Knee Flexion  120 was 98      Strength   Right Hip Flexion  4/5 was 3+    Right Hip Extension  3/5    Right Hip ABduction  3+/5    Left Hip Flexion  4/5    Left Hip Extension  4/5 was 3/5    Right Knee Flexion  5/5    Right Knee Extension  4-/5 was 3/5    Left Knee Flexion  5/5    Left Knee Extension  4/5    Right Ankle Dorsiflexion  4/5    Left Ankle Dorsiflexion  5/5      Ambulation/Gait   Ambulation Distance (Feet)  200 Feet    Assistive device  Straight cane    Gait Pattern  Step-through pattern            No data recorded       [facility] Adult PT Treatment/Exercise - 08/11/17 0001      Exercises   Exercises  Knee/Hip      Knee/Hip Exercises: Seated   Long Arc Quad  Right;15 reps      Knee/Hip Exercises: Supine   Quad Sets  Right;15 reps    Short Arc Quad Sets  Right;15 reps    Straight Leg Raises  10 reps    Knee Extension Limitations  14     Knee Flexion Limitations  120      Knee/Hip Exercises: Sidelying   Hip ABduction  10 reps      Knee/Hip Exercises: Prone   Hamstring Curl  5 reps    Hip Extension  10 reps      Manual Therapy   Manual Therapy  Manual Lymphatic Drainage (MLD)    Manual therapy comments  seperate from all other aspects of treatment    Manual Lymphatic Drainage (MLD)  inguinal axillary anastomosis followed by Rt LE              PT Education - 08/11/17 1604    Education provided  Yes    Education Details  Gait trained with single point cane pt did well and therapist urged pt to acquire and be using a cane at home.  Updated HEP        PT Short Term Goals - 08/11/17 1611      PT SHORT TERM GOAL #1   Title  Pt pain to be no greater than 5/10 to allow pt to sleep for four hours straight.     Baseline  7-9    Time  3    Period  Weeks    Status  Achieved      PT SHORT TERM GOAL #2   Title  PT right knee extension to be no greater than 12 for a more normalized gait pattern     Baseline  25    Time  3    Period  Weeks    Status  On-going      PT SHORT TERM GOAL #3   Title  Pt  knee flexion to be to 105 to allow pt to sit in comfort for 30 minutes to allow comfort in  car riding and eating meals for 30 minutes.     Time  3    Period  Weeks    Status  Achieved      PT SHORT TERM GOAL #4   Title  Pt to be walking with a cane in her home     Baseline  using a walker     Time  3    Period  Weeks    Status  On-going      PT SHORT TERM GOAL #5   Title  Pt muscular strength to be increased one half grade to allow pt to get on and off the commode without difficulty     Time  3    Period  Weeks    Status  Achieved        PT Long Term Goals - 08/11/17 1612      PT LONG TERM GOAL #1   Title  PT pain in her right knee to be no greater than a 2/10 to allow pt to sleep for at least 6 hours straight.     Time  6    Period  Weeks    Status  On-going      PT LONG TERM GOAL #2   Title   PT right knee extension to be four degrees or less to allow a normalize gait pattern     Time  6    Period  Weeks    Status  On-going      PT LONG TERM GOAL #3   Title  PT right knee flexion to be to 120 to allow pt to sit for an hour in comfort for going out to eat and traveling in the car.     Time  6    Period  Weeks    Status  Achieved      PT LONG TERM GOAL #4   Title  PT to be walking without an assistive device in her home and with a cane outside.     Time  6    Period  Weeks    Status  On-going      PT LONG TERM GOAL #5   Title  Pt single leg stance to be 30 seconds bilaterally to reduce risk of falls, (pt to have no falls in the past 3 weeks. )    Time  6    Period  Weeks    Status  On-going      PT LONG TERM GOAL #6   Title  PT strength in her right LE to be improved one grade to allow pt to go up and down steps in a reciprocal manner.     Time  6    Period  Weeks    Status  Partially Met            Plan - 08/11/17 1606    Clinical Impression Statement  Pt to MD who requested that therapy be decreased to once a week.  PT gait trained using single point cane and urged to use at home instead of walker.  PT flexion continues to improve but extension is still lacking.  Quadricep firing is most likely being inihibited  due to edma which is still present.   PT encouraged to complete pumpig from knee to thigh area.  Pt will continue to benefit from skilled physical  therapy to improve terminal extension strength and decrease edema.     Rehab Potential  Good    PT Frequency  3x / week    PT Duration  6 weeks    PT Treatment/Interventions  ADLs/Self Care Home Management;Cryotherapy;Gait training;Stair training;Functional mobility training;Therapeutic activities;Therapeutic exercise;Balance training;Patient/family education;Manual techniques    PT Next Visit Plan  Continue with manual to reduce edema  and improve soft tissue mobiltiy.  Give pt prone knee hang and supine towel  prop next visit.     PT Home Exercise Plan  heel raises, minisquats, LAQ, Qset, heelslides        Patient will benefit from skilled therapeutic intervention in order to improve the following deficits and impairments:  Abnormal gait, Pain, Decreased mobility, Decreased activity tolerance, Decreased range of motion, Decreased strength, Increased edema, Impaired flexibility, Difficulty walking, Decreased balance  Visit Diagnosis: Acute pain of right knee  Stiffness of right knee, not elsewhere classified  Difficulty in walking  Muscle weakness (generalized)     Problem List Patient Active Problem List   Diagnosis Date Noted  . Acute respiratory distress 07/07/2016  . Asthma exacerbation 07/07/2016  . Sinus tachycardia 10/22/2015  . Acute exacerbation of chronic obstructive pulmonary disease (COPD) ([place]) 10/21/2015  . Anxiety 10/21/2015  . GERD (gastroesophageal reflux disease) 10/21/2015  . Chronic pain 10/21/2015  . Hyperlipidemia 10/21/2015  . Acute respiratory failure with hypoxia ([place]) 02/18/2015  . COPD (chronic obstructive pulmonary disease) ([place]) 02/16/2015  . COPD exacerbation ([place]) 02/16/2015  . Mycoplasma pneumonia 02/16/2015  . Seizure ([place]) 02/22/2012  . Bleeding hemorrhoid 02/22/2012  . Acute encephalopathy 02/21/2012  . Asthma 02/21/2012  . Depression 02/21/2012    Rayetta Humphrey, PT CLT [phone] 08/11/2017, 4:13 PM  [facility] [address] [place], [place], [address] Phone: [phone]   Fax:  [phone]  Name: Theresa Pace MRN: [id number] Date of Birth: 1961/04/14

## 2017-08-14 ENCOUNTER — Telehealth (HOSPITAL_COMMUNITY): Payer: Self-pay | Admitting: Family Medicine

## 2017-08-14 ENCOUNTER — Ambulatory Visit (HOSPITAL_COMMUNITY): Payer: Medicaid Other | Admitting: Physical Therapy

## 2017-08-14 NOTE — Telephone Encounter (Signed)
08/14/17  Theresa Pace came to the front desk to say that patient should only be on the schedule one time a week and comes Friday's since that is her husband's day of

## 2017-08-16 ENCOUNTER — Encounter (HOSPITAL_COMMUNITY): Payer: Self-pay | Admitting: Physical Therapy

## 2017-08-18 ENCOUNTER — Telehealth (HOSPITAL_COMMUNITY): Payer: Self-pay | Admitting: Physical Therapy

## 2017-08-18 ENCOUNTER — Encounter (HOSPITAL_COMMUNITY): Payer: Self-pay | Admitting: Physical Therapy

## 2017-08-18 NOTE — Telephone Encounter (Signed)
Patient have a virus and will not be in today.

## 2017-08-21 ENCOUNTER — Encounter (HOSPITAL_COMMUNITY): Payer: Self-pay | Admitting: Physical Therapy

## 2017-08-23 ENCOUNTER — Encounter (HOSPITAL_COMMUNITY): Payer: Self-pay | Admitting: Physical Therapy

## 2017-08-25 ENCOUNTER — Telehealth (HOSPITAL_COMMUNITY): Payer: Self-pay | Admitting: Family Medicine

## 2017-08-25 ENCOUNTER — Ambulatory Visit (HOSPITAL_COMMUNITY): Payer: Medicaid Other | Admitting: Physical Therapy

## 2017-08-25 NOTE — Telephone Encounter (Signed)
08/25/17  pt called and cx said she was a little upset right now and she wouldn't be here today

## 2017-08-28 ENCOUNTER — Encounter (HOSPITAL_COMMUNITY): Payer: Self-pay | Admitting: Physical Therapy

## 2017-08-30 ENCOUNTER — Encounter (HOSPITAL_COMMUNITY): Payer: Self-pay | Admitting: Physical Therapy

## 2017-09-01 ENCOUNTER — Ambulatory Visit (HOSPITAL_COMMUNITY): Payer: Medicaid Other | Attending: Family Medicine

## 2017-09-01 ENCOUNTER — Telehealth (HOSPITAL_COMMUNITY): Payer: Self-pay

## 2017-09-01 NOTE — Telephone Encounter (Signed)
No show; PT called re missed appointment; no reason given for no show but pt stated that she is supposed to see Dr. Case on Tuesday and will f/u with [place] if he wants her to continue therapy.   Theresa Pace PT, DPT

## 2017-09-19 ENCOUNTER — Ambulatory Visit (HOSPITAL_COMMUNITY): Payer: Medicaid Other

## 2017-09-19 ENCOUNTER — Encounter (HOSPITAL_COMMUNITY): Payer: Self-pay

## 2017-09-19 ENCOUNTER — Telehealth (HOSPITAL_COMMUNITY): Payer: Self-pay | Admitting: Family Medicine

## 2017-09-19 NOTE — Telephone Encounter (Signed)
pt said she didnt want to reschedule this appt... she isn't really worried about her arm she said  09/19/17

## 2017-09-20 ENCOUNTER — Encounter (HOSPITAL_COMMUNITY): Payer: Self-pay

## 2017-09-20 ENCOUNTER — Ambulatory Visit (HOSPITAL_COMMUNITY): Payer: Medicaid Other | Attending: Orthopedic Surgery

## 2017-09-20 DIAGNOSIS — M25661 Stiffness of right knee, not elsewhere classified: Secondary | ICD-10-CM | POA: Insufficient documentation

## 2017-09-20 DIAGNOSIS — R262 Difficulty in walking, not elsewhere classified: Secondary | ICD-10-CM | POA: Insufficient documentation

## 2017-09-20 DIAGNOSIS — M25561 Pain in right knee: Secondary | ICD-10-CM | POA: Insufficient documentation

## 2017-09-20 DIAGNOSIS — M6281 Muscle weakness (generalized): Secondary | ICD-10-CM | POA: Insufficient documentation

## 2017-09-20 NOTE — Therapy (Addendum)
[place], [place], [address] Phone: [phone]   Fax:  [phone]    Progress Note Reporting Period 08/11/17 to 09/20/17  See note below for Objective Data and Assessment of Progress/Goals.     Physical Therapy Treatment/Reassessment  Patient Details  Name: Theresa Pace MRN: [id number] Date of Birth: 27-Apr-1961 Referring Provider: Remo Lipps Case, MD   Encounter Date: 09/20/2017  PT End of Session - 09/20/17 1435    Visit Number  5   Number of Visits  14    Date for PT Re-Evaluation  10/18/17    Authorization Type  Medicaid (reauth for 8 f/u visits submitted on 09/20/17)    Authorization Time Period  09/20/17 to 10/18/17    Authorization - Visit Number  0    Authorization - Number of Visits  8    PT Start Time  8366    PT Stop Time  2947    PT Time Calculation (min)  40 min    Activity Tolerance  Patient tolerated treatment well;No increased pain    Behavior During Therapy  WFL for tasks assessed/performed       Past Medical History:  Diagnosis Date  . Arthritis   . Asthma   . COPD (chronic obstructive pulmonary disease) ([place])   . Depression   . Fibromyalgia   . Frequent urination at night   . GERD (gastroesophageal reflux disease)   . Hypercholesteremia   . IBS (irritable bowel syndrome)   . Tendonitis     Past Surgical History:  Procedure Laterality Date  . ABDOMINAL HYSTERECTOMY    . APPENDECTOMY      There were no vitals filed for this visit.  Subjective Assessment - 09/20/17 1435    Subjective  Pt states that she fell abotu 3-4 weeks ago and "punched a hole" in her incision so Dr. Case told her to hold off on coming to PT for a while so as to decrease risk for infection. She states that it is completely healed and she does not have an infection. Her new referral is written for her to continue therapy and progress to a cane, however, she states that she has been using one for about am onth. She's  still having difficulty with bending it and performing functional tasks at home.     Pertinent History  COPD< HTN, anxiety     Limitations  Sitting;Standing;Walking;House hold activities    How long can you sit comfortably?  less than five minutes     How long can you stand comfortably?  stands with weight on left leg only.  Less than five minutes     How long can you walk comfortably?  uses a rolling walker for 10 minutes.      Patient Stated Goals  sleep better, less pain     Currently in Pain?  Yes    Pain Score  5     Pain Location  Knee    Pain Orientation  Right    Pain Descriptors / Indicators  Sore;Tightness    Pain Type  Chronic pain    Pain Onset  More than a month ago    Pain Frequency  Constant    Aggravating Factors   bending, walking too long    Pain Relieving Factors  ice and medication    Effect of Pain on Daily Activities  increases         [facility] PT Assessment - 09/20/17 0001  Assessment   Medical Diagnosis  Rt TKR    Referring Provider  Remo Lipps Case, MD    Onset Date/Surgical Date  06/20/17    Next MD Visit  around 10/17/17    Prior Therapy  HH      Precautions   Precautions  Fall      Restrictions   Weight Bearing Restrictions  No      Cognition   Overall Cognitive Status  Within Functional Limits for tasks assessed      Functional Tests   Functional tests  Single leg stance      Single Leg Stance   Comments  L: 5 sec; R: 8sec was  15 sec on L, 0 sec R      AROM   Right Knee Extension  8 was 14    Right Knee Flexion  113 was 120      Strength   Right Hip Flexion  4/5 was 4    Right Hip Extension  4-/5 was 3    Right Hip ABduction  4-/5 was 3+    Left Hip Flexion  4+/5 was 4    Left Hip Extension  4/5 was 4    Left Hip ABduction  4/5    Right Knee Extension  4+/5 was 4-    Left Knee Extension  4/5 was 4    Right Ankle Dorsiflexion  4+/5 was 4      Palpation   Patella mobility  WNL    Palpation comment  increased restrictions of distal  quad and tenderness to palpation throughout      Ambulation/Gait   Ambulation Distance (Feet)  210 Feet    Assistive device  Straight cane    Gait Pattern  Step-through pattern;Antalgic    Gait Comments  adjusted cane height and educated pt to use it in her LUE           [facility] Adult PT Treatment/Exercise - 09/20/17 0001      Manual Therapy   Manual Therapy  Soft tissue mobilization    Manual therapy comments  seperate from all other aspects of treatment    Soft tissue mobilization  light STM to decrease pain at EOS            PT Short Term Goals - 09/20/17 1443      PT SHORT TERM GOAL #1   Title  Pt pain to be no greater than 5/10 to allow pt to sleep for four hours straight.     Baseline  7-9    Time  3    Period  Weeks    Status  Achieved      PT SHORT TERM GOAL #2   Title  PT right knee extension to be no greater than 12 for a more normalized gait pattern     Baseline  5/1: 8    Time  3    Period  Weeks    Status  Achieved      PT SHORT TERM GOAL #3   Title  Pt knee flexion to be to 105 to allow pt to sit in comfort for 30 minutes to allow comfort in  car riding and eating meals for 30 minutes.     Baseline  5/1: 113deg, but can sit 15-20 mins    Time  3    Period  Weeks    Status  Partially Met      PT SHORT TERM GOAL #4   Title  Pt  to be walking with a cane in her home     Baseline  5/1: has been using it 2-3 weeks    Time  3    Period  Weeks    Status  Achieved      PT SHORT TERM GOAL #5   Title  Pt muscular strength to be increased one half grade to allow pt to get on and off the commode without difficulty     Time  3    Period  Weeks    Status  Achieved        PT Long Term Goals - 09/20/17 1444      PT LONG TERM GOAL #1   Title  PT pain in her right knee to be no greater than a 2/10 to allow pt to sleep for at least 6 hours straight.     Baseline  5/1: can sleep for 6 hours straight but pain averages about a 4    Time  6    Period  Weeks     Status  Partially Met      PT LONG TERM GOAL #2   Title  PT right knee extension to be four degrees or less to allow a normalize gait pattern     Baseline  5/1: 8deg knee ext    Time  6    Period  Weeks    Status  On-going      PT LONG TERM GOAL #3   Title  PT right knee flexion to be to 120 to allow pt to sit for an hour in comfort for going out to eat and traveling in the car.     Baseline  5/1: 113deg this date, can sit for 15-20 mins max    Time  6    Period  Weeks    Status  Partially Met      PT LONG TERM GOAL #4   Title  PT to be walking without an assistive device in her home and with a cane outside.     Baseline  5/1: intermittently use SPC in her home but currently on [facility] outside     Time  6    Period  Weeks    Status  Partially Met      PT LONG TERM GOAL #5   Title  Pt single leg stance to be 30 seconds bilaterally to reduce risk of falls, (pt to have no falls in the past 3 weeks. )    Baseline  5/1: L: 5 sec this date (limited due to having to bend R knee), R: 8 sec; no falls in last 3 weeks    Time  6    Period  Weeks    Status  On-going      PT LONG TERM GOAL #6   Title  PT strength in her right LE to be improved one grade to allow pt to go up and down steps in a reciprocal manner.     Baseline  5/1: see MMT    Time  6    Period  Weeks    Status  Partially Met            Plan - 09/20/17 1649    Clinical Impression Statement  Pt presents to therapy for first time since 08/11/17. She reports falling around that time and "punching a hole" in her incision so the MD asked her to hold off on coming to PT so as to decrease her  risk for infection. She saw him on 09/05/17 and he has asked that she continue therapy and work on gait training with a cane. PT reassessed goals and outcome measures this date since her POC ended on 09/03/17. Overall, pt has made great progress towards goals as illustrated above. Her MMT and gait are steadily improving and her AROM was  8-113deg this date. She continues with deficient quad contraction and min edema via gross assessment, which is likely contributing to her lack of knee extension ROM. She was using an [place] this date, though she was using it in the wrong hand and it was too short for her; PT made adjustments accordingly and pt noted to have improved gait mechanics almost immediately. Her balance and overall functional strength are still impaired alongside continued increased R knee pain. Pt needs skilled PT intervention to address these impairments in order to decrease pain and risks for falls, as well as improve overall function and QOL. Pt's physician requesting 12 more visits, however, PT feels pt does not need that many visits and is therefore only requesting for 8 f/u visits (2x/week for 4 weeks).     Rehab Potential  Good    PT Frequency  3x / week    PT Duration  6 weeks    PT Treatment/Interventions  ADLs/Self Care Home Management;Cryotherapy;Gait training;Stair training;Functional mobility training;Therapeutic activities;Therapeutic exercise;Balance training;Patient/family education;Manual techniques    PT Next Visit Plan  gait training with SPC, balance, knee and overall functional strengthening; Continue with manual to reduce edema and improve soft tissue mobiltiy. Give pt prone knee hang and supine towel prop next visit.     PT Home Exercise Plan  heel raises, minisquats, LAQ, Qset, heelslides     Consulted and Agree with Plan of Care  Patient       Patient will benefit from skilled therapeutic intervention in order to improve the following deficits and impairments:  Abnormal gait, Pain, Decreased mobility, Decreased activity tolerance, Decreased range of motion, Decreased strength, Increased edema, Impaired flexibility, Difficulty walking, Decreased balance  Visit Diagnosis: Acute pain of right knee - Plan: PT plan of care cert/re-cert  Stiffness of right knee, not elsewhere classified - Plan: PT plan of  care cert/re-cert  Difficulty in walking - Plan: PT plan of care cert/re-cert  Muscle weakness (generalized) - Plan: PT plan of care cert/re-cert     Problem List Patient Active Problem List   Diagnosis Date Noted  . Acute respiratory distress 07/07/2016  . Asthma exacerbation 07/07/2016  . Sinus tachycardia 10/22/2015  . Acute exacerbation of chronic obstructive pulmonary disease (COPD) ([place]) 10/21/2015  . Anxiety 10/21/2015  . GERD (gastroesophageal reflux disease) 10/21/2015  . Chronic pain 10/21/2015  . Hyperlipidemia 10/21/2015  . Acute respiratory failure with hypoxia ([place]) 02/18/2015  . COPD (chronic obstructive pulmonary disease) ([place]) 02/16/2015  . COPD exacerbation ([place]) 02/16/2015  . Mycoplasma pneumonia 02/16/2015  . Seizure ([place]) 02/22/2012  . Bleeding hemorrhoid 02/22/2012  . Acute encephalopathy 02/21/2012  . Asthma 02/21/2012  . Depression 02/21/2012      Geraldine Solar PT, DPT  [place] [address] [place], [place], [address] Phone: [phone]   Fax:  [phone]  Name: EMMOGENE SIMSON MRN: [id number] Date of Birth: 1960-07-02

## 2017-09-28 ENCOUNTER — Telehealth (HOSPITAL_COMMUNITY): Payer: Self-pay

## 2017-09-28 ENCOUNTER — Ambulatory Visit (HOSPITAL_COMMUNITY): Payer: Medicaid Other

## 2017-09-28 NOTE — Telephone Encounter (Signed)
No show, called pt with no answer.  Phone continued to ring with no answer or answering machine.  [address] [place]; [facility] [phone]

## 2017-10-02 ENCOUNTER — Ambulatory Visit (HOSPITAL_COMMUNITY): Payer: Medicaid Other

## 2017-10-02 ENCOUNTER — Telehealth (HOSPITAL_COMMUNITY): Payer: Self-pay

## 2017-10-02 NOTE — Telephone Encounter (Signed)
No show #2; spoke with pt re now show #2. Pt stating she fell and hasn't felt good for the last few days. PT educated pt that it was her 2nd no show in a row and per [place] policy, all remaining appointments following her next one on 5/15 will be cancelled and educated her that she would have to schedule her appointments one at a time now. She verbalized understanding. PT reminded pt of next appointment time and asked her to call and reschedule it if she couldn't make it.     Geraldine Solar PT, DPT

## 2017-10-04 ENCOUNTER — Telehealth (HOSPITAL_COMMUNITY): Payer: Self-pay | Admitting: Family Medicine

## 2017-10-04 ENCOUNTER — Ambulatory Visit (HOSPITAL_COMMUNITY): Payer: Medicaid Other

## 2017-10-04 NOTE — Telephone Encounter (Signed)
10/04/17  pt cx said her IBS is acting up and she will call us back to reschedule

## 2017-10-09 ENCOUNTER — Encounter (HOSPITAL_COMMUNITY): Payer: Self-pay

## 2017-10-11 ENCOUNTER — Encounter (HOSPITAL_COMMUNITY): Payer: Self-pay

## 2017-10-17 ENCOUNTER — Encounter (HOSPITAL_COMMUNITY): Payer: Self-pay

## 2017-10-19 ENCOUNTER — Encounter (HOSPITAL_COMMUNITY): Payer: Self-pay

## 2017-10-19 ENCOUNTER — Ambulatory Visit (HOSPITAL_COMMUNITY): Payer: Medicaid Other

## 2017-10-19 ENCOUNTER — Telehealth (HOSPITAL_COMMUNITY): Payer: Self-pay

## 2017-10-19 NOTE — Telephone Encounter (Signed)
No transportation °

## 2017-10-23 ENCOUNTER — Encounter (HOSPITAL_COMMUNITY): Payer: Self-pay

## 2017-10-24 ENCOUNTER — Telehealth (HOSPITAL_COMMUNITY): Payer: Self-pay

## 2017-10-24 ENCOUNTER — Encounter (HOSPITAL_COMMUNITY): Payer: Medicaid Other

## 2017-10-24 NOTE — Telephone Encounter (Signed)
PT called pt again and pt answered. PT explained that her authorization ended on 10/19/17 so today's appointment will have to be cancelled since it is not authorized. PT asked pt if she wished to continue therapy or like to be discharged and pt stated she wanted to continue. PT asked if she would be able to attend her appointments if Medicaid approved and explained that she was not seen at all during her last authorization period and pt stated that she would be able to attend her appointments. PT will reapply for Medicaid reauthorization and told pt that our offices would contact her as soon as we hear back from them.   Geraldine Solar PT, DPT

## 2017-10-24 NOTE — Telephone Encounter (Signed)
Attempted to call regarding appointment today at 3:15; her Medicaid authorization ended on 5/30 and PT was attempting to call pt to see if she wanted to continue therapy. If so, PT will apply for reauthorization and if not, PT will discharge pt. However, pt did not answer the phone and no answering machine set up. Will attempt to call back.   Geraldine Solar PT, DPT

## 2017-10-25 ENCOUNTER — Encounter (HOSPITAL_COMMUNITY): Payer: Self-pay

## 2017-10-30 ENCOUNTER — Encounter (HOSPITAL_COMMUNITY): Payer: Self-pay

## 2017-11-01 ENCOUNTER — Encounter (HOSPITAL_COMMUNITY): Payer: Self-pay

## 2017-11-03 ENCOUNTER — Telehealth (HOSPITAL_COMMUNITY): Payer: Self-pay

## 2017-11-03 ENCOUNTER — Encounter (HOSPITAL_COMMUNITY): Payer: Self-pay

## 2017-11-03 ENCOUNTER — Ambulatory Visit (HOSPITAL_COMMUNITY): Payer: Medicaid Other | Attending: Orthopedic Surgery

## 2017-11-03 NOTE — Telephone Encounter (Signed)
No show #3; called pt re missed appointment she stated she thought it was next Friday. PT explained to pt that she has no-showed 3x during her POC; she was educated that per [facility], she will be discharged at this time due to her no-shows and if she wants to continue, she will have to get a new written referral from her physician. Pt verbalized understanding. PT discharged pt today.    Geraldine Solar PT, DPT

## 2017-11-03 NOTE — Therapy (Signed)
[place], [place], [address] Phone: [phone]   Fax:  [phone]  Patient Details  Name: Theresa Pace MRN: [id number] Date of Birth: 12/25/1960 Referring Provider:  No ref. provider found  Encounter Date: 11/03/2017     Pt has no-showed 3x during this POC; per [place], pt is being discharged at this time. PT educated her that she could return with new written referral from her physician if she wanted to continue.   PHYSICAL THERAPY DISCHARGE SUMMARY  Visits from Start of Care: 5  Current functional level related to goals / functional outcomes: See last treatment note   Remaining deficits: See last treatment note   Education / Equipment: See last treatment note  Plan: Patient agrees to discharge.  Patient goals were partially met. Patient is being discharged due to not returning since the last visit.  ?????       Geraldine Solar PT, [facility] [address] [place], [place], [address] Phone: [phone]   Fax:  [phone]

## 2017-11-06 ENCOUNTER — Encounter (HOSPITAL_COMMUNITY): Payer: Self-pay

## 2017-11-07 ENCOUNTER — Ambulatory Visit (HOSPITAL_COMMUNITY): Payer: Medicaid Other

## 2017-11-08 ENCOUNTER — Encounter (HOSPITAL_COMMUNITY): Payer: Self-pay

## 2017-11-09 ENCOUNTER — Encounter (HOSPITAL_COMMUNITY): Payer: Self-pay | Admitting: Physical Therapy

## 2017-11-13 ENCOUNTER — Encounter (HOSPITAL_COMMUNITY): Payer: Self-pay

## 2017-11-14 ENCOUNTER — Encounter (HOSPITAL_COMMUNITY): Payer: Self-pay

## 2017-11-15 ENCOUNTER — Encounter (HOSPITAL_COMMUNITY): Payer: Self-pay

## 2017-11-17 ENCOUNTER — Encounter (HOSPITAL_COMMUNITY): Payer: Self-pay

## 2017-11-20 ENCOUNTER — Encounter (HOSPITAL_COMMUNITY): Payer: Self-pay

## 2017-11-22 ENCOUNTER — Encounter (HOSPITAL_COMMUNITY): Payer: Self-pay

## 2017-11-30 ENCOUNTER — Encounter: Payer: Self-pay | Admitting: Internal Medicine

## 2018-02-28 ENCOUNTER — Ambulatory Visit: Payer: Medicaid Other | Admitting: Nurse Practitioner

## 2018-02-28 NOTE — Progress Notes (Deleted)
Primary Care Physician:  Caryl Bis, MD Primary Gastroenterologist:  Dr.   Rayne Du chief complaint on file.   HPI:   Theresa Pace is a 57 y.o. female who presents on referral from primary care for diarrhea.  The patient has not been seen by our practice since 2006.  Initially referred to [place] in January but she was a no-show for her office visit.  Reviewed information provided with the referral including ***.  Previous colonoscopy and EGD completed 02/15/2005.  EGD found erosive antral gastritis with duodenitis, small discoid lesion in the antrum with central erosion status post biopsy, no evidence of Barrett's.  Surgical pathology found the biopsies to be mild gastropathy, scattered non-H. pylori organisms identified.  Colonoscopy was found to be normal except for external hemorrhoids and suspect majority of symptoms due to IBS.  Recommended H. pylori serology, continue antireflux measures and Nexium, daily fiber.   It appears patient received a letter in 2013 from [facility] for recall colonoscopy.  No further colonoscopy note found.  She was a no-show to her office visit 06/12/2017.  Today she states   Past Medical History:  Diagnosis Date  . Arthritis   . Asthma   . COPD (chronic obstructive pulmonary disease) ([place])   . Depression   . Fibromyalgia   . Frequent urination at night   . GERD (gastroesophageal reflux disease)   . Hypercholesteremia   . IBS (irritable bowel syndrome)   . Tendonitis     Past Surgical History:  Procedure Laterality Date  . ABDOMINAL HYSTERECTOMY    . APPENDECTOMY      Current Outpatient Medications  Medication Sig Dispense Refill  . acetaminophen (TYLENOL) 500 MG tablet Take 1,000-1,500 mg by mouth every 6 (six) hours as needed for moderate pain.    Marland Kitchen albuterol (PROVENTIL HFA;VENTOLIN HFA) 108 (90 BASE) MCG/ACT inhaler Inhale 2 puffs into the lungs every 6 (six) hours as needed.    . ALPRAZolam (XANAX) 1 MG tablet Take 1 mg by mouth 3 (three)  times daily.    Marland Kitchen amphetamine-dextroamphetamine (ADDERALL XR) 30 MG 24 hr capsule Take 90 mg by mouth every morning.    Marland Kitchen aspirin EC 81 MG tablet Take 81 mg by mouth daily.    Marland Kitchen azithromycin (ZITHROMAX) 250 MG tablet Take one tablet daily by mouth for 4 days. (Patient not taking: Reported on 06/27/2016) 4 tablet 0  . budesonide (PULMICORT) 0.5 MG/2ML nebulizer solution Take 2 mLs (0.5 mg total) by nebulization daily. 50 mL 0  . ciclopirox (PENLAC) 8 % solution Apply 1 application topically at bedtime. Apply over nail and surrounding skin. Apply daily over previous coat. After seven (7) days, may remove with alcohol and continue cycle.    . diltiazem (CARDIZEM) 30 MG tablet Take 1 tablet (30 mg total) by mouth 3 (three) times daily. 90 tablet 1  . fluticasone-salmeterol (ADVAIR HFA) 230-21 MCG/ACT inhaler Inhale 2 puffs into the lungs 2 (two) times daily.    . halobetasol (ULTRAVATE) 0.05 % cream Apply 1 application topically 2 (two) times daily. eczema    . ipratropium-albuterol (DUONEB) 0.5-2.5 (3) MG/3ML SOLN Take 3 mLs by nebulization 4 (four) times daily.    . mirabegron ER (MYRBETRIQ) 50 MG TB24 tablet Take 50 mg by mouth daily.    Marland Kitchen oxyCODONE-acetaminophen (PERCOCET) 10-325 MG per tablet Take 1 tablet by mouth 3 (three) times daily.    . pantoprazole (PROTONIX) 40 MG tablet Take 1 tablet (40 mg total) by  mouth 2 (two) times daily before a meal. 60 tablet 3  . polyethylene glycol (MIRALAX / GLYCOLAX) packet Take 17 g by mouth daily. 14 each 0  . predniSONE (STERAPRED UNI-PAK 21 TAB) 10 MG (21) TBPK tablet Take 6 tabs by mouth daily  for 2 days, then 5 tabs for 2 days, then 4 tabs for 2 days, then 3 tabs for 2 days, 2 tabs for 2 days, then 1 tab by mouth daily for 2 days 42 tablet 0  . rosuvastatin (CRESTOR) 20 MG tablet Take 20 mg by mouth daily.    . traZODone (DESYREL) 50 MG tablet Take 150 mg by mouth at bedtime.      No current facility-administered medications for this visit.      Allergies as of 02/28/2018 - Review Complete 09/20/2017  Allergen Reaction Noted  . Methadone Hives 11/21/2012  . Aspirin Other (See Comments) 02/21/2012    No family history on file.  Social History   Socioeconomic History  . Marital status: Married    Spouse name: Not on file  . Number of children: Not on file  . Years of education: Not on file  . Highest education level: Not on file  Occupational History  . Not on file  Social Needs  . Financial resource strain: Not on file  . Food insecurity:    Worry: Not on file    Inability: Not on file  . Transportation needs:    Medical: Not on file    Non-medical: Not on file  Tobacco Use  . Smoking status: Former Smoker    Packs/day: 0.25    Types: Cigarettes  . Smokeless tobacco: Never Used  Substance and Sexual Activity  . Alcohol use: No  . Drug use: No  . Sexual activity: Yes    Birth control/protection: Surgical  Lifestyle  . Physical activity:    Days per week: Not on file    Minutes per session: Not on file  . Stress: Not on file  Relationships  . Social connections:    Talks on phone: Not on file    Gets together: Not on file    Attends religious service: Not on file    Active member of club or organization: Not on file    Attends meetings of clubs or organizations: Not on file    Relationship status: Not on file  . Intimate partner violence:    Fear of current or ex partner: Not on file    Emotionally abused: Not on file    Physically abused: Not on file    Forced sexual activity: Not on file  Other Topics Concern  . Not on file  Social History Narrative  . Not on file    Review of Systems: General: Negative for anorexia, weight loss, fever, chills, fatigue, weakness. Eyes: Negative for vision changes.  ENT: Negative for hoarseness, difficulty swallowing , nasal congestion. CV: Negative for chest pain, angina, palpitations, dyspnea on exertion, peripheral edema.  Respiratory: Negative for  dyspnea at rest, dyspnea on exertion, cough, sputum, wheezing.  GI: See history of present illness. GU:  Negative for dysuria, hematuria, urinary incontinence, urinary frequency, nocturnal urination.  MS: Negative for joint pain, low back pain.  Derm: Negative for rash or itching.  Neuro: Negative for weakness, abnormal sensation, seizure, frequent headaches, memory loss, confusion.  Psych: Negative for anxiety, depression, suicidal ideation, hallucinations.  Endo: Negative for unusual weight change.  Heme: Negative for bruising or bleeding. Allergy: Negative for rash or  hives.    Physical Exam: There were no vitals taken for this visit. General:   Alert and oriented. Pleasant and cooperative. Well-nourished and well-developed.  Head:  Normocephalic and atraumatic. Eyes:  Without icterus, sclera clear and conjunctiva pink.  Ears:  Normal auditory acuity. Mouth:  No deformity or lesions, oral mucosa pink.  Throat/Neck:  Supple, without mass or thyromegaly. Cardiovascular:  S1, S2 present without murmurs appreciated. Normal pulses noted. Extremities without clubbing or edema. Respiratory:  Clear to auscultation bilaterally. No wheezes, rales, or rhonchi. No distress.  Gastrointestinal:  +BS, soft, non-tender and non-distended. No HSM noted. No guarding or rebound. No masses appreciated.  Rectal:  Deferred  Musculoskalatal:  Symmetrical without gross deformities. Normal posture. Skin:  Intact without significant lesions or rashes. Neurologic:  Alert and oriented x4;  grossly normal neurologically. Psych:  Alert and cooperative. Normal mood and affect. Heme/Lymph/Immune: No significant cervical adenopathy. No excessive bruising noted.    02/28/2018 7:53 AM   Disclaimer: This note was dictated with voice recognition software. Similar sounding words can inadvertently be transcribed and may not be corrected upon review.

## 2018-03-08 NOTE — Telephone Encounter (Signed)
PT spoke with patient; she reported she had called and cancelled her appointment already. She has not been able to get to the "other room" to get her breathing treatment for her COPD today and had to cancel her appt. She is planning to attend her next ap    By Jeannie Done, PT

## 2018-04-08 IMAGING — CR DG CHEST 1V PORT
1 series · 1 of 1 positions shown · non-contrast
Comparison: 06/21/2016

CLINICAL DATA: Persistent shortness of Breath, recent history of
pneumonia

EXAM:
PORTABLE CHEST 1 VIEW

[portable]
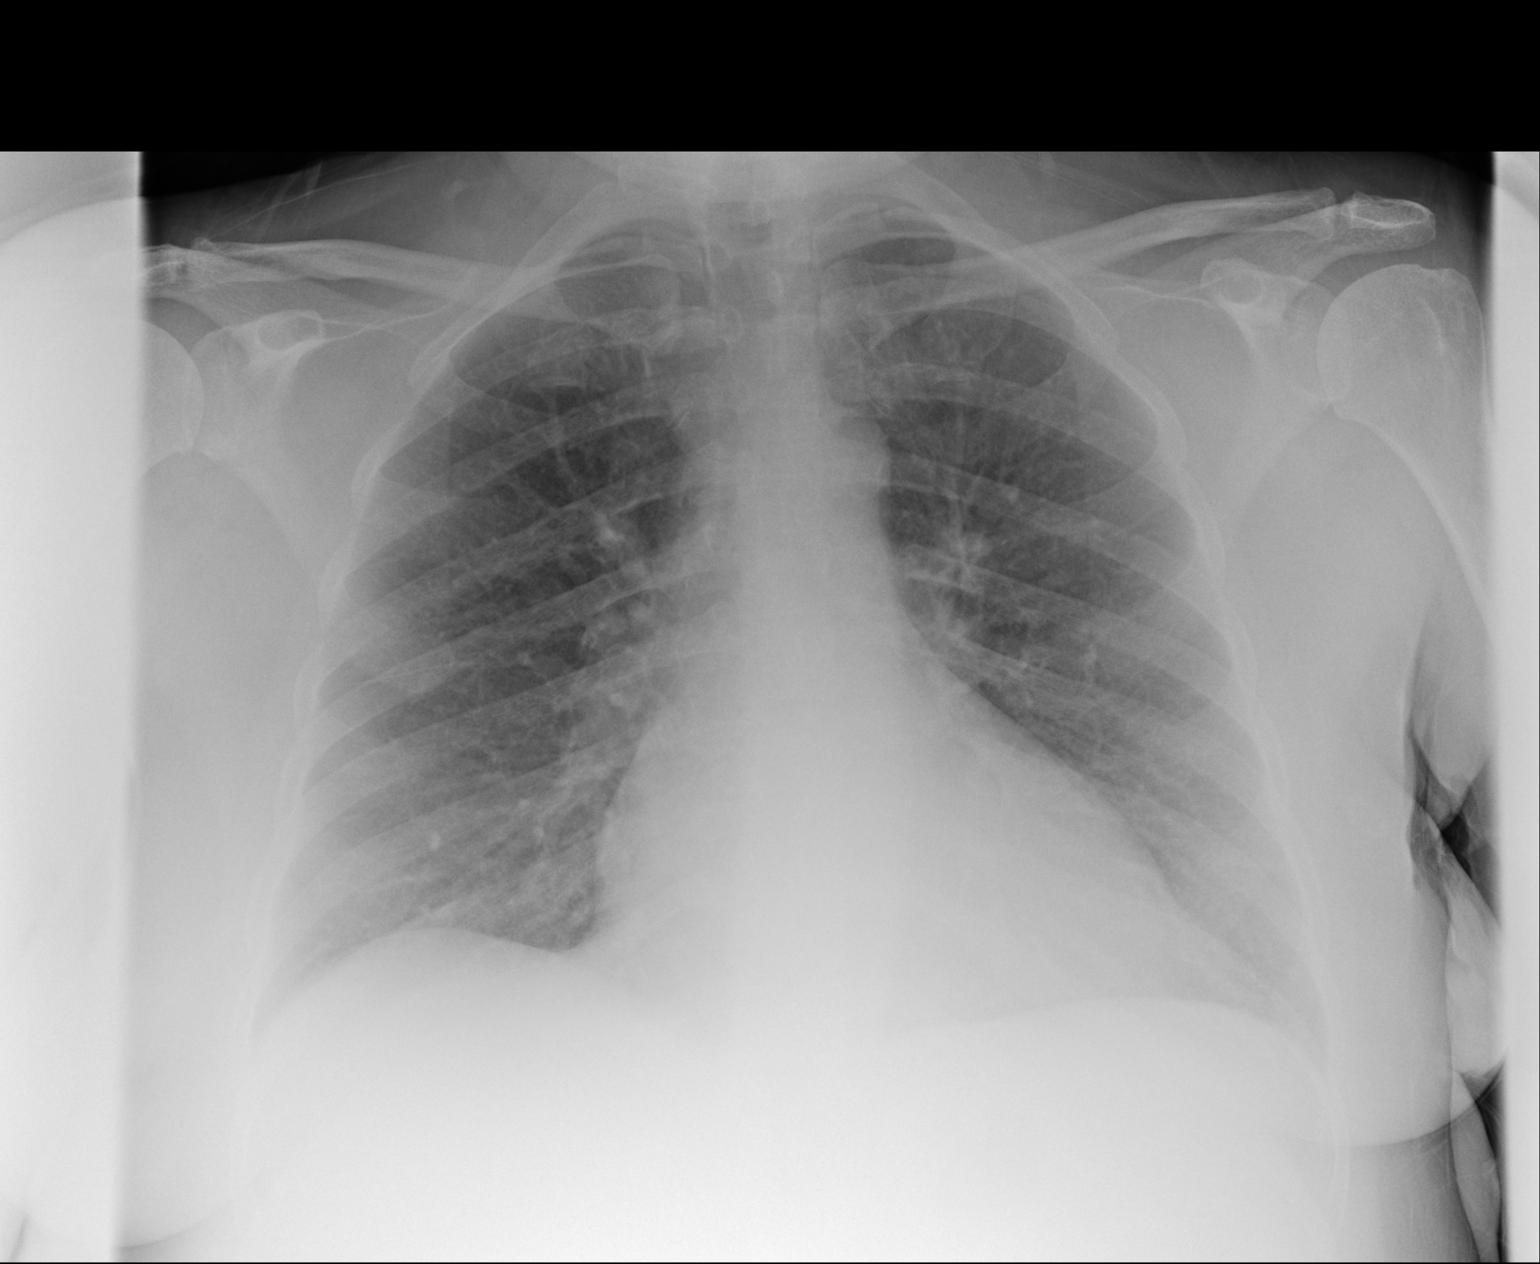

[1 of 1 positions shown; findings below may reference images not displayed]

FINDINGS: The heart size and mediastinal contours are within normal limits.
Both lungs are clear. The visualized skeletal structures are
unremarkable.
IMPRESSION: No active disease.

## 2018-04-13 NOTE — Progress Notes (Deleted)
Psychiatric Initial Adult Assessment   Patient Identification: Theresa Pace MRN:  [id number] Date of Evaluation:  04/13/2018 Referral Source: Caryl Bis, MD  Chief Complaint:   Visit Diagnosis: No diagnosis found.  History of Present Illness:   Theresa Pace is a 57 y.o. year old female with a history of depression, anxiety, COPD, low back pain, who is referred for depression.      Associated Signs/Symptoms: Depression Symptoms:  {DEPRESSION SYMPTOMS:20000} (Hypo) Manic Symptoms:  {BHH MANIC SYMPTOMS:22872} Anxiety Symptoms:  {BHH ANXIETY SYMPTOMS:22873} Psychotic Symptoms:  {BHH PSYCHOTIC SYMPTOMS:22874} PTSD Symptoms: {BHH PTSD SYMPTOMS:22875}  Past Psychiatric History:  Outpatient:  Psychiatry admission:  Previous suicide attempt:  Past trials of medication:  History of violence:   Previous Psychotropic Medications: {YES/NO:21197}  Substance Abuse History in the last 12 months:  {yes no:314532}  Consequences of Substance Abuse: {BHH CONSEQUENCES OF SUBSTANCE ABUSE:22880}  Past Medical History:  Past Medical History:  Diagnosis Date  . Arthritis   . Asthma   . COPD (chronic obstructive pulmonary disease) ([place])   . Depression   . Fibromyalgia   . Frequent urination at night   . GERD (gastroesophageal reflux disease)   . Hypercholesteremia   . IBS (irritable bowel syndrome)   . Tendonitis     Past Surgical History:  Procedure Laterality Date  . ABDOMINAL HYSTERECTOMY    . APPENDECTOMY      Family Psychiatric History: ***  Family History: No family history on file.  Social History:   Social History   Socioeconomic History  . Marital status: Married    Spouse name: Not on file  . Number of children: Not on file  . Years of education: Not on file  . Highest education level: Not on file  Occupational History  . Not on file  Social Needs  . Financial resource strain: Not on file  . Food insecurity:    Worry: Not on file    Inability: Not  on file  . Transportation needs:    Medical: Not on file    Non-medical: Not on file  Tobacco Use  . Smoking status: Former Smoker    Packs/day: 0.25    Types: Cigarettes  . Smokeless tobacco: Never Used  Substance and Sexual Activity  . Alcohol use: No  . Drug use: No  . Sexual activity: Yes    Birth control/protection: Surgical  Lifestyle  . Physical activity:    Days per week: Not on file    Minutes per session: Not on file  . Stress: Not on file  Relationships  . Social connections:    Talks on phone: Not on file    Gets together: Not on file    Attends religious service: Not on file    Active member of club or organization: Not on file    Attends meetings of clubs or organizations: Not on file    Relationship status: Not on file  Other Topics Concern  . Not on file  Social History Narrative  . Not on file    Additional Social History: ***  Allergies:   Allergies  Allergen Reactions  . Methadone Hives  . Aspirin Other (See Comments)    Stomach Pain    Metabolic Disorder Labs: Lab Results  Component Value Date   HGBA1C  08/12/2007    6.1 (NOTE)   The ADA recommends the following therapeutic goals for glycemic   control related to Hgb A1C measurement:   Goal of Therapy:   <  7.0% Hgb A1C   Action Suggested:  > 8.0% Hgb A1C   Ref:  Diabetes Care, 22, Suppl. 1, 1999   MPG 140 08/12/2007   Lab Results  Component Value Date   PROLACTIN 4.5 02/21/2012   No results found for: CHOL, TRIG, HDL, CHOLHDL, VLDL, LDLCALC Lab Results  Component Value Date   TSH 0.743 02/21/2012    Therapeutic Level Labs: No results found for: LITHIUM No results found for: CBMZ No results found for: VALPROATE  Current Medications: Current Outpatient Medications  Medication Sig Dispense Refill  . acetaminophen (TYLENOL) 500 MG tablet Take 1,000-1,500 mg by mouth every 6 (six) hours as needed for moderate pain.    Marland Kitchen albuterol (PROVENTIL HFA;VENTOLIN HFA) 108 (90 BASE) MCG/ACT  inhaler Inhale 2 puffs into the lungs every 6 (six) hours as needed.    . ALPRAZolam (XANAX) 1 MG tablet Take 1 mg by mouth 3 (three) times daily.    Marland Kitchen amphetamine-dextroamphetamine (ADDERALL XR) 30 MG 24 hr capsule Take 90 mg by mouth every morning.    Marland Kitchen aspirin EC 81 MG tablet Take 81 mg by mouth daily.    Marland Kitchen azithromycin (ZITHROMAX) 250 MG tablet Take one tablet daily by mouth for 4 days. (Patient not taking: Reported on 06/27/2016) 4 tablet 0  . budesonide (PULMICORT) 0.5 MG/2ML nebulizer solution Take 2 mLs (0.5 mg total) by nebulization daily. 50 mL 0  . ciclopirox (PENLAC) 8 % solution Apply 1 application topically at bedtime. Apply over nail and surrounding skin. Apply daily over previous coat. After seven (7) days, may remove with alcohol and continue cycle.    . diltiazem (CARDIZEM) 30 MG tablet Take 1 tablet (30 mg total) by mouth 3 (three) times daily. 90 tablet 1  . fluticasone-salmeterol (ADVAIR HFA) 230-21 MCG/ACT inhaler Inhale 2 puffs into the lungs 2 (two) times daily.    . halobetasol (ULTRAVATE) 0.05 % cream Apply 1 application topically 2 (two) times daily. eczema    . ipratropium-albuterol (DUONEB) 0.5-2.5 (3) MG/3ML SOLN Take 3 mLs by nebulization 4 (four) times daily.    . mirabegron ER (MYRBETRIQ) 50 MG TB24 tablet Take 50 mg by mouth daily.    Marland Kitchen oxyCODONE-acetaminophen (PERCOCET) 10-325 MG per tablet Take 1 tablet by mouth 3 (three) times daily.    . pantoprazole (PROTONIX) 40 MG tablet Take 1 tablet (40 mg total) by mouth 2 (two) times daily before a meal. 60 tablet 3  . polyethylene glycol (MIRALAX / GLYCOLAX) packet Take 17 g by mouth daily. 14 each 0  . predniSONE (STERAPRED UNI-PAK 21 TAB) 10 MG (21) TBPK tablet Take 6 tabs by mouth daily  for 2 days, then 5 tabs for 2 days, then 4 tabs for 2 days, then 3 tabs for 2 days, 2 tabs for 2 days, then 1 tab by mouth daily for 2 days 42 tablet 0  . rosuvastatin (CRESTOR) 20 MG tablet Take 20 mg by mouth daily.    . traZODone  (DESYREL) 50 MG tablet Take 150 mg by mouth at bedtime.      No current facility-administered medications for this visit.     Musculoskeletal: Strength & Muscle Tone: within normal limits Gait & Station: normal Patient leans: N/A  Psychiatric Specialty Exam: ROS  There were no vitals taken for this visit.There is no height or weight on file to calculate BMI.  General Appearance: Fairly Groomed  Eye Contact:  Good  Speech:  Clear and Coherent  Volume:  Normal  Mood:  {  [place] MBEM:75449}  Affect:  {Affect (PAA):22687}  Thought Process:  Coherent  Orientation:  Full (Time, Place, and Person)  Thought Content:  Logical  Suicidal Thoughts:  {ST/HT (PAA):22692}  Homicidal Thoughts:  {ST/HT (PAA):22692}  Memory:  Immediate;   Good  Judgement:  {Judgement (PAA):22694}  Insight:  {Insight (PAA):22695}  Psychomotor Activity:  Normal  Concentration:  Concentration: Good and Attention Span: Good  Recall:  Good  Fund of Knowledge:Good  Language: Good  Akathisia:  No  Handed:  Right  AIMS (if indicated):  not done  Assets:  Communication Skills Desire for Improvement  ADL's:  Intact  Cognition: WNL  Sleep:  {BHH GOOD/FAIR/POOR:22877}   Screenings:   Assessment and Plan:    Plan  The patient demonstrates the following risk factors for suicide: Chronic risk factors for suicide include: {Chronic Risk Factors for EEFEOFH:21975883}. Acute risk factors for suicide include: {Acute Risk Factors for GPQDIYM:41583094}. Protective factors for this patient include: {Protective Factors for Suicide MHWK:08811031}. Considering these factors, the overall suicide risk at this point appears to be {Desc; low/moderate/high:110033}. Patient {ACTION; IS/IS RXY:58592924} appropriate for outpatient follow up.     Norman Clay, MD 11/22/201911:48 AM

## 2018-04-17 ENCOUNTER — Ambulatory Visit (HOSPITAL_COMMUNITY): Payer: Self-pay | Admitting: Psychiatry

## 2018-06-06 ENCOUNTER — Encounter

## 2018-06-06 ENCOUNTER — Encounter: Payer: Self-pay | Admitting: Nurse Practitioner

## 2018-06-06 ENCOUNTER — Ambulatory Visit: Payer: Medicaid Other | Admitting: Nurse Practitioner

## 2018-06-06 ENCOUNTER — Encounter: Payer: Self-pay | Admitting: *Deleted

## 2018-06-06 ENCOUNTER — Other Ambulatory Visit: Payer: Self-pay | Admitting: *Deleted

## 2018-06-06 VITALS — BP 127/81 | HR 113 | Temp 99.2°F | Ht 62.0 in | Wt 193.6 lb

## 2018-06-06 DIAGNOSIS — Z86010 Personal history of colonic polyps: Secondary | ICD-10-CM | POA: Diagnosis not present

## 2018-06-06 DIAGNOSIS — K589 Irritable bowel syndrome without diarrhea: Secondary | ICD-10-CM | POA: Insufficient documentation

## 2018-06-06 DIAGNOSIS — K649 Unspecified hemorrhoids: Secondary | ICD-10-CM

## 2018-06-06 DIAGNOSIS — K580 Irritable bowel syndrome with diarrhea: Secondary | ICD-10-CM

## 2018-06-06 MED ORDER — CLENPIQ 10-3.5-12 MG-GM -GM/160ML PO SOLN: 1.0000 | Freq: Once | ORAL | 0 refills | Status: AC

## 2018-06-06 NOTE — Patient Instructions (Signed)
Your health issues we discussed today were:   Irritable bowel syndrome and chronic diarrhea: 1. Complete stool studies as soon as you can to check for infection 2. If your stool studies are negative we can try a medicine called Bentyl to see if this helps your abdominal pain and diarrhea  History of colon polyps: 1. We will schedule you for colonoscopy given your diarrhea, abdominal pain, history of colon polyps 2. We will request your previous colonoscopy report which was done 4 to 5 years ago with a recommended 5-year repeat 3. On your colonoscopy we can check for hemorrhoids to make recommendations for further hemorrhoid treatment  Overall I recommend:  1. Follow-up in 4 months. 2. Call us if you have any questions or concerns.  At [facility] we value your feedback. You may receive a survey about your visit today. Please share your experience as we strive to create trusting relationships with our patients to provide genuine, compassionate, quality care.  We appreciate your understanding and patience as we review any laboratory studies, imaging, and other diagnostic tests that are ordered as we care for you. Our office policy is 5 business days for review of these results, and any emergent or urgent results are addressed in a timely manner for your best interest. If you do not hear from our office in 1 week, please contact us.   We also encourage the use of MyChart, which contains your medical information for your review as well. If you are not enrolled in this feature, an access code is on this after visit summary for your convenience. Thank you for allowing [place] to be involved in your care.  It was great to see you today!  I hope you have a great day!!

## 2018-06-06 NOTE — Progress Notes (Signed)
Primary Care Physician:  Caryl Bis, MD Primary Gastroenterologist:  Dr. Gala Romney  Chief Complaint  Patient presents with  . Irritable Bowel Syndrome    HPI:   Theresa Pace is a 58 y.o. female who presents on referral from primary care for diarrhea.  The patient was previously scheduled for an initial office visit in January 2019 but she was a no-show.  Reviewed information provided with referral including office visit dated 11/29/2017.  At that time she noted GERD which is chronic and improved with medication.  She does have chronic fibromyalgia.  She also indicated she has irritable bowel syndrome diffusely and chronic.  She gets a lot of gas and has sharp pains at times with bloating.  Some diarrhea persistent.  Has been using a potty chair at times and has multiple loose stools a day.  Indicates it is hard to go to restaurants and apparently is impeding on her quality of life.  She notes that stress is an important trigger for her IBS.  Overall her abdominal exam at that time seem normal.  She was referred to GI for further evaluation.  No history of colonoscopy or endoscopy in our system.  Today she states she's doing ok overall. Diagnosed with IBS in her early 20's at [facility]. She has significant abdominal pain (was previously on pain medication at a time due to the pain). When she has her pain it typically trigers a bowel movement. Pain doesn't seem to improve after a bowel movement. Has about 5-6 bowel movements or more a day. Stools are typically 80% diarrhea, remaining 20% consistent with Bristol 5-6. Has had hematochezia, last time was a couple weeks ago, typically in the form of toilet tissue hematochezia but not in the commode or on the stool. Has known hemorrhoids. Hemorrhoid flare with rectal discomfort and difficulty sitting down after a bowel movement. Has tried hemorrhoids Rx cream with not much improvement. Has had to use adult briefs due to intermittent  incontinence/"accidents". Last colonoscopy was about 4.5 years ago with Dr. Britta Mccreedy in [place], [place] and they found a couple polyps and recommended repeat exam in 4-5 years.Denies chest pain, dyspnea, dizziness, lightheadedness, syncope, near syncope. Denies any other upper or lower GI symptoms.  Still has her gallbladder. Denies history of pancreatitis.  Past Medical History:  Diagnosis Date  . Arthritis   . Asthma   . COPD (chronic obstructive pulmonary disease) ([place])   . Depression   . Fibromyalgia   . Frequent urination at night   . GERD (gastroesophageal reflux disease)   . Hypercholesteremia   . IBS (irritable bowel syndrome)   . Tachycardia    treated with Cardizem  . Tendonitis     Past Surgical History:  Procedure Laterality Date  . ABDOMINAL HYSTERECTOMY    . APPENDECTOMY    . KNEE ARTHROPLASTY Right 05/23/2017    Current Outpatient Medications  Medication Sig Dispense Refill  . acetaminophen (TYLENOL) 500 MG tablet Take 1,000-1,500 mg by mouth every 6 (six) hours as needed for moderate pain.    Marland Kitchen albuterol (PROVENTIL HFA;VENTOLIN HFA) 108 (90 BASE) MCG/ACT inhaler Inhale 2 puffs into the lungs every 6 (six) hours as needed.    Marland Kitchen amphetamine-dextroamphetamine (ADDERALL XR) 30 MG 24 hr capsule Take 90 mg by mouth every morning.    Marland Kitchen aspirin EC 81 MG tablet Take 81 mg by mouth daily.    . budesonide (PULMICORT) 0.5 MG/2ML nebulizer solution Take 2 mLs (0.5 mg total) by  nebulization daily. 50 mL 0  . ciclopirox (PENLAC) 8 % solution Apply 1 application topically at bedtime. Apply over nail and surrounding skin. Apply daily over previous coat. After seven (7) days, may remove with alcohol and continue cycle.    . diltiazem (CARDIZEM) 30 MG tablet Take 1 tablet (30 mg total) by mouth 3 (three) times daily. 90 tablet 1  . halobetasol (ULTRAVATE) 0.05 % cream Apply 1 application topically 2 (two) times daily. eczema    . ipratropium-albuterol (DUONEB) 0.5-2.5 (3) MG/3ML SOLN Take 3  mLs by nebulization 4 (four) times daily.    . pantoprazole (PROTONIX) 40 MG tablet Take 1 tablet (40 mg total) by mouth 2 (two) times daily before a meal. 60 tablet 3  . risperiDONE (RISPERDAL) 0.5 MG tablet Take 0.5 mg by mouth 2 (two) times daily.    . rosuvastatin (CRESTOR) 20 MG tablet Take 20 mg by mouth daily.    . traZODone (DESYREL) 50 MG tablet Take 150 mg by mouth at bedtime.      No current facility-administered medications for this visit.     Allergies as of 06/06/2018 - Review Complete 06/06/2018  Allergen Reaction Noted  . Methadone Hives 11/21/2012  . Aspirin Other (See Comments) 02/21/2012    History reviewed. No pertinent family history.  Social History   Socioeconomic History  . Marital status: Married    Spouse name: Not on file  . Number of children: Not on file  . Years of education: Not on file  . Highest education level: Not on file  Occupational History  . Not on file  Social Needs  . Financial resource strain: Not on file  . Food insecurity:    Worry: Not on file    Inability: Not on file  . Transportation needs:    Medical: Not on file    Non-medical: Not on file  Tobacco Use  . Smoking status: Former Smoker    Packs/day: 0.25    Types: Cigarettes  . Smokeless tobacco: Never Used  Substance and Sexual Activity  . Alcohol use: No  . Drug use: No  . Sexual activity: Yes    Birth control/protection: Surgical  Lifestyle  . Physical activity:    Days per week: Not on file    Minutes per session: Not on file  . Stress: Not on file  Relationships  . Social connections:    Talks on phone: Not on file    Gets together: Not on file    Attends religious service: Not on file    Active member of club or organization: Not on file    Attends meetings of clubs or organizations: Not on file    Relationship status: Not on file  . Intimate partner violence:    Fear of current or ex partner: Not on file    Emotionally abused: Not on file     Physically abused: Not on file    Forced sexual activity: Not on file  Other Topics Concern  . Not on file  Social History Narrative  . Not on file    Review of Systems: General: Negative for anorexia, weight loss, fever, chills, fatigue, weakness. ENT: Negative for hoarseness, difficulty swallowing. CV: Negative for chest pain, angina, palpitations, peripheral edema.  Respiratory: Negative for dyspnea at rest, cough, sputum, wheezing.  GI: See history of present illness. MS: Negative for joint pain, low back pain.  Derm: Negative for rash or itching.  Endo: Negative for unusual weight change.  Heme:  Negative for bruising or bleeding. Allergy: Negative for rash or hives.    Physical Exam: BP 127/81   Pulse (!) 113   Temp 99.2 F (37.3 C) (Oral)   Ht 5\' 2"  (1.575 m)   Wt 193 lb 9.6 oz (87.8 kg)   BMI 35.41 kg/m  General:   Alert and oriented. Pleasant and cooperative. Well-nourished and well-developed.  Head:  Normocephalic and atraumatic. Eyes:  Without icterus, sclera clear and conjunctiva pink.  Ears:  Normal auditory acuity. Cardiovascular:  S1, S2 present without murmurs appreciated. Extremities without clubbing or edema. Respiratory:  Clear to auscultation bilaterally. No wheezes, rales, or rhonchi. No distress.  Gastrointestinal:  +BS, soft, non-tender and non-distended. No HSM noted. No guarding or rebound. No masses appreciated.  Rectal:  Deferred  Musculoskalatal:  Symmetrical without gross deformities. Neurologic:  Alert and oriented x4;  grossly normal neurologically. Psych:  Alert and cooperative. Normal mood and affect. Heme/Lymph/Immune: No excessive bruising noted.    06/06/2018 2:56 PM   Disclaimer: This note was dictated with voice recognition software. Similar sounding words can inadvertently be transcribed and may not be corrected upon review.

## 2018-06-07 ENCOUNTER — Telehealth: Payer: Self-pay | Admitting: *Deleted

## 2018-06-07 NOTE — Telephone Encounter (Signed)
Pre-op scheduled for 06/25/2018 at 2:45pm. Patient aware. Letter mailed.

## 2018-06-15 NOTE — Assessment & Plan Note (Signed)
Patient was diagnosed with IBS in her 53s.  She has frequently diarrhea stools about 80% of the time, remaining 20% or Bristol 5-6 stools.  This seems to be acute on chronic.  Previous colonoscopy 4-1/2 years ago in [place], [place] and he found a couple polyps and recommended repeat exam in 4 to 5 years.  She is currently due.  I will check stool studies to ensure no occult infection.  If stool studies are negative she can try Bentyl.  We will request her previous colonoscopy report and plan for an updated colonoscopy due to rectal bleeding and the fact that she is due.  Follow-up in 4 months.

## 2018-06-15 NOTE — Assessment & Plan Note (Signed)
History of colon polyps 4 to 5 years ago with recommendation to repeat colonoscopy in 4 to 5 years.  She is currently due.  We will proceed with this, as per above.  This will allow further evaluation of her rectal bleeding to ensure it is benign anorectal source is suspected.  This also allow [place] to find any other possible etiologies behind her diarrhea other than chronic, known IBS.  Follow-up in 4 months.

## 2018-06-15 NOTE — Assessment & Plan Note (Signed)
The patient does note intermittent rectal bleeding with known hemorrhoids and hemorrhoid symptoms after a bowel movement.  She frequently has diarrhea and this is likely resulting in her flareups.  No improvement in abdominal pain after a bowel movement.  Hemorrhoid cream has not helped very much as of yet.  Given rectal bleeding and recommendation for 4 to 5-year repeat colonoscopy on her exam 4 to 5 years ago, we will plan to update her colonoscopy at this time.  We will request her previous colonoscopy report and associated pathology in order to have this available.  Follow-up in 4 months.  Further recommendations to follow colonoscopy.  Proceed with TCS on propofol/MAC with Dr. Gala Romney in near future: the risks, benefits, and alternatives have been discussed with the patient in detail. The patient states understanding and desires to proceed.  The patient is currently on Adderall, Risperdal, trazodone.  No other anticoagulants, anxiolytics, chronic pain medications, or antidepressants.  We will plan for the procedure on propofol/MAC to promote adequate sedation.

## 2018-06-18 NOTE — Progress Notes (Signed)
CC'D TO PCP °

## 2018-06-21 NOTE — Patient Instructions (Signed)
Theresa Pace  06/21/2018     @PREFPERIOPPHARMACY @   Your procedure is scheduled on  06/28/2018.  Report to [facility] at  1000  A.M.  Call this number if you have problems the morning of surgery:  [phone]   Remember:  Follow the diet and prep instructions given to you by Dr Roseanne Kaufman office.                     Take these medicines the morning of surgery with A SIP OF WATER  Diltiazem, protonix, risperdal ( if needed). Use your inhalers and your nebulizer before you come.    Do not wear jewelry, make-up or nail polish.  Do not wear lotions, powders, or perfumes, or deodorant.  Do not shave 48 hours prior to surgery.  Men may shave face and neck.  Do not bring valuables to the hospital.  [facility] is not responsible for any belongings or valuables.  Contacts, dentures or bridgework may not be worn into surgery.  Leave your suitcase in the car.  After surgery it may be brought to your room.  For patients admitted to the hospital, discharge time will be determined by your treatment team.  Patients discharged the day of surgery will not be allowed to drive home.   Name and phone number of your driver:   family Special instructions:  None  Please read over the following fact sheets that you were given. Anesthesia Post-op Instructions and Care and Recovery After Surgery       Colonoscopy, Adult A colonoscopy is an exam to look at the large intestine. It is done to check for problems, such as:  Lumps (tumors).  Growths (polyps).  Swelling (inflammation).  Bleeding. What happens before the procedure? Eating and drinking Follow instructions from your doctor about eating and drinking. These instructions may include:  A few days before the procedure - follow a low-fiber diet. ? Avoid nuts. ? Avoid seeds. ? Avoid dried fruit. ? Avoid raw fruits. ? Avoid vegetables.  1-3 days before the procedure - follow a clear liquid diet. Avoid liquids that have  red or purple dye. Drink only clear liquids, such as: ? Clear broth or bouillon. ? Black coffee or tea. ? Clear juice. ? Clear soft drinks or sports drinks. ? Gelatin dessert. ? Popsicles.  On the day of the procedure - do not eat or drink anything during the 2 hours before the procedure. Up to 2 hours before the procedure, you may continue to drink clear liquids, such as water or clear fruit juice.  Bowel prep If you were prescribed an oral bowel prep:  Take it as told by your doctor. Starting the day before your procedure, you will need to drink a lot of liquid. The liquid will cause you to poop (have bowel movements) until your poop is almost clear or light green.  To clean out your colon, you may also be given: ? Laxative medicines. ? Instructions about how to use an enema.  If your skin or butt gets irritated from diarrhea, you may: ? Wipe the area with wipes that have medicine in them, such as adult wet wipes with aloe and vitamin E. ? Put something on your skin that soothes the area, such as petroleum jelly.  If you throw up (vomit) while drinking the bowel prep, take a break for up to 60 minutes. Then begin the bowel prep again. If  you keep throwing up and you cannot take the bowel prep without throwing up, call your doctor. General instructions  Ask your doctor about: ? Changing or stopping your normal medicines. This is important if you take iron pills, diabetes medicines, or blood thinners. ? Taking medicines such as aspirin and ibuprofen. These medicines can thin your blood. Do not take these medicines unless your doctor tells you to take them.  Plan to have someone take you home from the hospital or clinic. What happens during the procedure?   An IV tube may be put into one of your veins.  You will be given medicine to help you relax (sedative).  To reduce your risk of infection: ? Your doctors will wash their hands. ? Your anal area will be washed with  soap.  You will be asked to lie on your side with your knees bent.  Your doctor will get a long, thin, flexible tube ready. The tube will have a camera and a light on the end.  The tube will be put into your anus.  The tube will be gently put into your large intestine.  Air will be delivered into your large intestine to keep it open. You may feel some pressure or cramping.  The camera will be used to take photos.  A small tissue sample may be removed for testing (biopsy).  If small growths are found, your doctor may remove them and have them checked for cancer.  The tube that was put into your anus will be slowly removed. The procedure may vary among doctors and hospitals. What happens after the procedure?  Your doctor will check on you often until the medicines you were given have worn off.  Do not drive for 24 hours after the procedure.  You may have a small amount of blood in your poop.  You may pass gas.  You may have mild cramps or bloating in your belly (abdomen).  It is up to you to get the results of your procedure. Ask your doctor, or the department performing the procedure, when your results will be ready. Summary  A colonoscopy is an exam to look at the large intestine.  Follow instructions from your doctor about eating and drinking before the procedure.  If you were prescribed an oral bowel prep to clean out your colon, take it as told by your doctor.  Your doctor will check on you often until the medicines you were given have worn off.  Plan to have someone take you home from the hospital or clinic. This information is not intended to replace advice given to you by your health care provider. Make sure you discuss any questions you have with your health care provider. Document Released: 06/11/2010 Document Revised: 03/08/2017 Document Reviewed: 07/21/2015 Elsevier Interactive Patient Education  2019 [employer]  Colonoscopy, Adult, Care After This sheet  gives you information about how to care for yourself after your procedure. Your health care provider may also give you more specific instructions. If you have problems or questions, contact your health care provider. What can I expect after the procedure? After the procedure, it is common to have:  A small amount of blood in your stool for 24 hours after the procedure.  Some gas.  Mild abdominal cramping or bloating. Follow these instructions at home: General instructions  For the first 24 hours after the procedure: ? Do not drive or use machinery. ? Do not sign important documents. ? Do not drink alcohol. ?  Do your regular daily activities at a slower pace than normal. ? Eat soft, easy-to-digest foods.  Take over-the-counter or prescription medicines only as told by your health care provider. Relieving cramping and bloating   Try walking around when you have cramps or feel bloated.  Apply heat to your abdomen as told by your health care provider. Use a heat source that your health care provider recommends, such as a moist heat pack or a heating pad. ? Place a towel between your skin and the heat source. ? Leave the heat on for 20-30 minutes. ? Remove the heat if your skin turns bright red. This is especially important if you are unable to feel pain, heat, or cold. You may have a greater risk of getting burned. Eating and drinking   Drink enough fluid to keep your urine pale yellow.  Resume your normal diet as instructed by your health care provider. Avoid heavy or fried foods that are hard to digest.  Avoid drinking alcohol for as long as instructed by your health care provider. Contact a health care provider if:  You have blood in your stool 2-3 days after the procedure. Get help right away if:  You have more than a small spotting of blood in your stool.  You pass large blood clots in your stool.  Your abdomen is swollen.  You have nausea or vomiting.  You have a  fever.  You have increasing abdominal pain that is not relieved with medicine. Summary  After the procedure, it is common to have a small amount of blood in your stool. You may also have mild abdominal cramping and bloating.  For the first 24 hours after the procedure, do not drive or use machinery, sign important documents, or drink alcohol.  Contact your health care provider if you have a lot of blood in your stool, nausea or vomiting, a fever, or increased abdominal pain. This information is not intended to replace advice given to you by your health care provider. Make sure you discuss any questions you have with your health care provider. Document Released: 12/22/2003 Document Revised: 03/01/2017 Document Reviewed: 07/21/2015 Elsevier Interactive Patient Education  2019 [place] Anesthesia is a term that refers to techniques, procedures, and medicines that help a person stay safe and comfortable during a medical procedure. Monitored anesthesia care, or sedation, is one type of anesthesia. Your anesthesia specialist may recommend sedation if you will be having a procedure that does not require you to be unconscious, such as:  Cataract surgery.  A dental procedure.  A biopsy.  A colonoscopy. During the procedure, you may receive a medicine to help you relax (sedative). There are three levels of sedation:  Mild sedation. At this level, you may feel awake and relaxed. You will be able to follow directions.  Moderate sedation. At this level, you will be sleepy. You may not remember the procedure.  Deep sedation. At this level, you will be asleep. You will not remember the procedure. The more medicine you are given, the deeper your level of sedation will be. Depending on how you respond to the procedure, the anesthesia specialist may change your level of sedation or the type of anesthesia to fit your needs. An anesthesia specialist will monitor you  closely during the procedure. Let your health care provider know about:  Any allergies you have.  All medicines you are taking, including vitamins, herbs, eye drops, creams, and over-the-counter medicines.  Any use of steroids (  by mouth or as a cream).  Any problems you or family members have had with sedatives and anesthetic medicines.  Any blood disorders you have.  Any surgeries you have had.  Any medical conditions you have, such as sleep apnea.  Whether you are pregnant or may be pregnant.  Any use of cigarettes, alcohol, or street drugs. What are the risks? Generally, this is a safe procedure. However, problems may occur, including:  Getting too much medicine (oversedation).  Nausea.  Allergic reaction to medicines.  Trouble breathing. If this happens, a breathing tube may be used to help with breathing. It will be removed when you are awake and breathing on your own.  Heart trouble.  Lung trouble. Before the procedure Staying hydrated Follow instructions from your health care provider about hydration, which may include:  Up to 2 hours before the procedure - you may continue to drink clear liquids, such as water, clear fruit juice, black coffee, and plain tea. Eating and drinking restrictions Follow instructions from your health care provider about eating and drinking, which may include:  8 hours before the procedure - stop eating heavy meals or foods such as meat, fried foods, or fatty foods.  6 hours before the procedure - stop eating light meals or foods, such as toast or cereal.  6 hours before the procedure - stop drinking milk or drinks that contain milk.  2 hours before the procedure - stop drinking clear liquids. Medicines Ask your health care provider about:  Changing or stopping your regular medicines. This is especially important if you are taking diabetes medicines or blood thinners.  Taking medicines such as aspirin and ibuprofen. These  medicines can thin your blood. Do not take these medicines before your procedure if your health care provider instructs you not to. Tests and exams  You will have a physical exam.  You may have blood tests done to show: ? How well your kidneys and liver are working. ? How well your blood can clot. General instructions  Plan to have someone take you home from the hospital or clinic.  If you will be going home right after the procedure, plan to have someone with you for 24 hours.  What happens during the procedure?  Your blood pressure, heart rate, breathing, level of pain and overall condition will be monitored.  An IV tube will be inserted into one of your veins.  Your anesthesia specialist will give you medicines as needed to keep you comfortable during the procedure. This may mean changing the level of sedation.  The procedure will be performed. After the procedure  Your blood pressure, heart rate, breathing rate, and blood oxygen level will be monitored until the medicines you were given have worn off.  Do not drive for 24 hours if you received a sedative.  You may: ? Feel sleepy, clumsy, or nauseous. ? Feel forgetful about what happened after the procedure. ? Have a sore throat if you had a breathing tube during the procedure. ? Vomit. This information is not intended to replace advice given to you by your health care provider. Make sure you discuss any questions you have with your health care provider. Document Released: 02/02/2005 Document Revised: 10/16/2015 Document Reviewed: 08/30/2015 Elsevier Interactive Patient Education  2019 [place], Care After These instructions provide you with information about caring for yourself after your procedure. Your health care provider may also give you more specific instructions. Your treatment has been planned according to current  medical practices, but problems sometimes occur. Call your health care  provider if you have any problems or questions after your procedure. What can I expect after the procedure? After your procedure, you may:  Feel sleepy for several hours.  Feel clumsy and have poor balance for several hours.  Feel forgetful about what happened after the procedure.  Have poor judgment for several hours.  Feel nauseous or vomit.  Have a sore throat if you had a breathing tube during the procedure. Follow these instructions at home: For at least 24 hours after the procedure:      Have a responsible adult stay with you. It is important to have someone help care for you until you are awake and alert.  Rest as needed.  Do not: ? Participate in activities in which you could fall or become injured. ? Drive. ? Use heavy machinery. ? Drink alcohol. ? Take sleeping pills or medicines that cause drowsiness. ? Make important decisions or sign legal documents. ? Take care of children on your own. Eating and drinking  Follow the diet that is recommended by your health care provider.  If you vomit, drink water, juice, or soup when you can drink without vomiting.  Make sure you have little or no nausea before eating solid foods. General instructions  Take over-the-counter and prescription medicines only as told by your health care provider.  If you have sleep apnea, surgery and certain medicines can increase your risk for breathing problems. Follow instructions from your health care provider about wearing your sleep device: ? Anytime you are sleeping, including during daytime naps. ? While taking prescription pain medicines, sleeping medicines, or medicines that make you drowsy.  If you smoke, do not smoke without supervision.  Keep all follow-up visits as told by your health care provider. This is important. Contact a health care provider if:  You keep feeling nauseous or you keep vomiting.  You feel light-headed.  You develop a rash.  You have a  fever. Get help right away if:  You have trouble breathing. Summary  For several hours after your procedure, you may feel sleepy and have poor judgment.  Have a responsible adult stay with you for at least 24 hours or until you are awake and alert. This information is not intended to replace advice given to you by your health care provider. Make sure you discuss any questions you have with your health care provider. Document Released: 08/30/2015 Document Revised: 12/23/2016 Document Reviewed: 08/30/2015 Elsevier Interactive Patient Education  2019 [employer].

## 2018-06-25 ENCOUNTER — Encounter (HOSPITAL_COMMUNITY)
Admission: RE | Admit: 2018-06-25 | Discharge: 2018-06-25 | Disposition: A | Discharge status: Home / Self Care | Payer: Medicaid Other | Source: Ambulatory Visit | Attending: Internal Medicine | Admitting: Internal Medicine

## 2018-06-26 NOTE — Patient Instructions (Signed)
   Your procedure is scheduled on: 06/28/2018  Report to [facility] at  10:30   AM.  Call this number if you have problems the morning of surgery: [phone]   Remember:              Follow Directions on the letter you received from Your Physician's office regarding the Bowel Prep  :  Take these medicines the morning of surgery with A SIP OF WATER: Adderall, Diltrazem,and Protonix   Pulmicort Neb if needed   Do not wear jewelry, make-up or nail polish.    Do not bring valuables to the hospital.  Contacts, dentures or bridgework may not be worn into surgery.  .   Patients discharged the day of surgery will not be allowed to drive home.     Colonoscopy, Adult, Care After This sheet gives you information about how to care for yourself after your procedure. Your health care provider may also give you more specific instructions. If you have problems or questions, contact your health care provider. What can I expect after the procedure? After the procedure, it is common to have:  A small amount of blood in your stool for 24 hours after the procedure.  Some gas.  Mild abdominal cramping or bloating.  Follow these instructions at home: General instructions   For the first 24 hours after the procedure: ? Do not drive or use machinery. ? Do not sign important documents. ? Do not drink alcohol. ? Do your regular daily activities at a slower pace than normal. ? Eat soft, easy-to-digest foods. ? Rest often.  Take over-the-counter or prescription medicines only as told by your health care provider.  It is up to you to get the results of your procedure. Ask your health care provider, or the department performing the procedure, when your results will be ready. Relieving cramping and bloating  Try walking around when you have cramps or feel bloated.  Apply heat to your abdomen as told by your health care provider. Use a heat source that your health care provider recommends, such as a  moist heat pack or a heating pad. ? Place a towel between your skin and the heat source. ? Leave the heat on for 20-30 minutes. ? Remove the heat if your skin turns bright red. This is especially important if you are unable to feel pain, heat, or cold. You may have a greater risk of getting burned. Eating and drinking  Drink enough fluid to keep your urine clear or pale yellow.  Resume your normal diet as instructed by your health care provider. Avoid heavy or fried foods that are hard to digest.  Avoid drinking alcohol for as long as instructed by your health care provider. Contact a health care provider if:  You have blood in your stool 2-3 days after the procedure. Get help right away if:  You have more than a small spotting of blood in your stool.  You pass large blood clots in your stool.  Your abdomen is swollen.  You have nausea or vomiting.  You have a fever.  You have increasing abdominal pain that is not relieved with medicine. This information is not intended to replace advice given to you by your health care provider. Make sure you discuss any questions you have with your health care provider. Document Released: 12/22/2003 Document Revised: 02/01/2016 Document Reviewed: 07/21/2015 Elsevier Interactive Patient Education  [employer].

## 2018-06-27 ENCOUNTER — Encounter (HOSPITAL_COMMUNITY)
Admission: RE | Admit: 2018-06-27 | Discharge: 2018-06-27 | Disposition: A | Discharge status: Home / Self Care | Payer: Medicaid Other | Source: Ambulatory Visit | Attending: Internal Medicine | Admitting: Internal Medicine

## 2018-06-27 ENCOUNTER — Encounter (HOSPITAL_COMMUNITY): Payer: Self-pay

## 2018-06-27 NOTE — Pre-Procedure Instructions (Signed)
Message sent to [facility] and Dr Gala Romney. She was a no show foe her PAT.

## 2018-06-28 ENCOUNTER — Telehealth: Payer: Self-pay | Admitting: Internal Medicine

## 2018-06-28 MED ORDER — CLENPIQ 10-3.5-12 MG-GM -GM/160ML PO SOLN: 1.0000 | Freq: Once | ORAL | 0 refills | Status: AC

## 2018-06-28 NOTE — Telephone Encounter (Signed)
Pt is scheduled colonoscopy today with RMR. She wants to reschedule because she is still going to the bathroom and her husband can't get his foot wrap wet. Please call her ar [phone]

## 2018-06-28 NOTE — Telephone Encounter (Signed)
Called pt, TCS w/Propofol w/RMR rescheduled to 08/20/18 at 2:00pm. Rx for prep sent to pharmacy. Informed endo scheduler. Endo scheduler scheduled pre-op phone call 08/14/18. Called pt back and informed her of phone call. New instructions mailed.

## 2018-07-19 ENCOUNTER — Emergency Department (HOSPITAL_COMMUNITY)
Admission: EM | Admit: 2018-07-19 | Discharge: 2018-07-19 | Disposition: A | Discharge status: Home / Self Care | Payer: Medicaid Other | Attending: Emergency Medicine | Admitting: Emergency Medicine

## 2018-07-19 ENCOUNTER — Encounter (HOSPITAL_COMMUNITY): Payer: Self-pay

## 2018-07-19 ENCOUNTER — Other Ambulatory Visit: Payer: Self-pay

## 2018-07-19 ENCOUNTER — Emergency Department (HOSPITAL_COMMUNITY): Payer: Medicaid Other

## 2018-07-19 DIAGNOSIS — Z87891 Personal history of nicotine dependence: Secondary | ICD-10-CM | POA: Diagnosis not present

## 2018-07-19 DIAGNOSIS — Y999 Unspecified external cause status: Secondary | ICD-10-CM | POA: Insufficient documentation

## 2018-07-19 DIAGNOSIS — W01198A Fall on same level from slipping, tripping and stumbling with subsequent striking against other object, initial encounter: Secondary | ICD-10-CM | POA: Insufficient documentation

## 2018-07-19 DIAGNOSIS — S92214A Nondisplaced fracture of cuboid bone of right foot, initial encounter for closed fracture: Secondary | ICD-10-CM | POA: Insufficient documentation

## 2018-07-19 DIAGNOSIS — Y929 Unspecified place or not applicable: Secondary | ICD-10-CM | POA: Insufficient documentation

## 2018-07-19 DIAGNOSIS — S92901A Unspecified fracture of right foot, initial encounter for closed fracture: Secondary | ICD-10-CM

## 2018-07-19 DIAGNOSIS — X501XXA Overexertion from prolonged static or awkward postures, initial encounter: Secondary | ICD-10-CM | POA: Insufficient documentation

## 2018-07-19 DIAGNOSIS — Z79899 Other long term (current) drug therapy: Secondary | ICD-10-CM | POA: Insufficient documentation

## 2018-07-19 DIAGNOSIS — Z7982 Long term (current) use of aspirin: Secondary | ICD-10-CM | POA: Insufficient documentation

## 2018-07-19 DIAGNOSIS — S99921A Unspecified injury of right foot, initial encounter: Secondary | ICD-10-CM | POA: Diagnosis present

## 2018-07-19 DIAGNOSIS — Y9389 Activity, other specified: Secondary | ICD-10-CM | POA: Diagnosis not present

## 2018-07-19 DIAGNOSIS — J449 Chronic obstructive pulmonary disease, unspecified: Secondary | ICD-10-CM | POA: Insufficient documentation

## 2018-07-19 MED ORDER — OXYCODONE-ACETAMINOPHEN 5-325 MG PO TABS: 1.0000 | ORAL_TABLET | ORAL | 0 refills | Status: DC | PRN

## 2018-07-19 MED ORDER — OXYCODONE-ACETAMINOPHEN 5-325 MG PO TABS
1.0000 | ORAL_TABLET | Freq: Once | ORAL | Status: AC
  Administered 2018-07-19: 1 via ORAL
  Filled 2018-07-19: qty 1

## 2018-07-19 NOTE — ED Triage Notes (Signed)
Ems reports around 0330 she was walking to the restroom and twisted r foot.  Reports hit foot on a space heater, no burn noted.  C/O pain and swelling to r foot.

## 2018-07-19 NOTE — ED Provider Notes (Signed)
[facility] EMERGENCY DEPARTMENT Provider Note   CSN: [id number] Arrival date & time: 07/19/18  1950    History   Chief Complaint Chief Complaint  Patient presents with  . Fall    HPI ERSILIA BRAWLEY is a 58 y.o. female.     HPI   DAJAH FISCHMAN is a 58 y.o. female who presents to the Emergency Department complaining of right ankle and foot pain after a mechanical fall in which she "twisted" her foot when attempting to go to the restroom.  She also states that her foot struck the side of a space heater, but she denies a burn to her foot.  She was able to get up w/o assistance.  Injury occurred at 3:00 am this morning.  She later woke up with increasing pain and swelling to the top of her foot prompting her to seek ER evaluation.  She denies other injuries, head injury, LOC, dizziness, numbness or the extremity, and open wounds.    Past Medical History:  Diagnosis Date  . Arthritis   . Asthma   . COPD (chronic obstructive pulmonary disease) ([place])   . Depression   . Fibromyalgia   . Frequent urination at night   . GERD (gastroesophageal reflux disease)   . Hypercholesteremia   . IBS (irritable bowel syndrome)   . Tachycardia    treated with Cardizem  . Tendonitis     Patient Active Problem List   Diagnosis Date Noted  . IBS (irritable bowel syndrome) 06/06/2018  . History of colon polyps 06/06/2018  . Acute respiratory distress 07/07/2016  . Asthma exacerbation 07/07/2016  . Sinus tachycardia 10/22/2015  . Acute exacerbation of chronic obstructive pulmonary disease (COPD) ([place]) 10/21/2015  . Anxiety 10/21/2015  . GERD (gastroesophageal reflux disease) 10/21/2015  . Chronic pain 10/21/2015  . Hyperlipidemia 10/21/2015  . Acute respiratory failure with hypoxia ([place]) 02/18/2015  . COPD (chronic obstructive pulmonary disease) ([place]) 02/16/2015  . COPD exacerbation ([place]) 02/16/2015  . Mycoplasma pneumonia 02/16/2015  . Seizure ([place]) 02/22/2012  . Bleeding hemorrhoid  02/22/2012  . Acute encephalopathy 02/21/2012  . Asthma 02/21/2012  . Depression 02/21/2012    Past Surgical History:  Procedure Laterality Date  . ABDOMINAL HYSTERECTOMY    . APPENDECTOMY    . KNEE ARTHROPLASTY Right 05/23/2017     OB History   No obstetric history on file.      Home Medications    Prior to Admission medications   Medication Sig Start Date End Date Taking? Authorizing Provider  albuterol (PROVENTIL HFA;VENTOLIN HFA) 108 (90 BASE) MCG/ACT inhaler Inhale 2 puffs into the lungs every 6 (six) hours as needed for wheezing or shortness of breath.     [provider]  amphetamine-dextroamphetamine (ADDERALL XR) 30 MG 24 hr capsule Take 90 mg by mouth every morning.    [provider]  aspirin EC 81 MG tablet Take 81 mg by mouth daily.    [provider]  budesonide (PULMICORT) 0.5 MG/2ML nebulizer solution Take 2 mLs (0.5 mg total) by nebulization daily. Patient taking differently: Take 0.5 mg by nebulization 4 (four) times daily as needed (for shortness of breath or wheezing).  06/27/16   Isla Pence, MD  diltiazem (DILACOR XR) 120 MG 24 hr capsule Take 120 mg by mouth daily.    [provider]  ipratropium-albuterol (DUONEB) 0.5-2.5 (3) MG/3ML SOLN Take 3 mLs by nebulization 4 (four) times daily. Patient taking differently: Take 3 mLs by nebulization 3 (three) times daily.  02/20/15   Samuella Cota, MD  loperamide (IMODIUM A-D) 2 MG tablet Take 8 mg by mouth daily as needed for diarrhea or loose stools.    [provider]  pantoprazole (PROTONIX) 40 MG tablet Take 1 tablet (40 mg total) by mouth 2 (two) times daily before a meal. 07/11/16   Orvan Falconer, MD  risperiDONE (RISPERDAL) 0.5 MG tablet Take 0.5 mg by mouth 2 (two) times daily.    [provider]  rosuvastatin (CRESTOR) 20 MG tablet Take 20 mg by mouth daily.    [provider]  traZODone (DESYREL) 50 MG tablet Take 150 mg by mouth at bedtime.      [provider]    Family History No family history on file.  Social History Social History   Tobacco Use  . Smoking status: Former Smoker    Packs/day: 0.25    Types: Cigarettes  . Smokeless tobacco: Never Used  Substance Use Topics  . Alcohol use: No  . Drug use: No     Allergies   Methadone and Aspirin   Review of Systems Review of Systems  Constitutional: Negative for chills and fever.  Respiratory: Negative for shortness of breath.   Genitourinary: Negative for dysuria.  Musculoskeletal: Positive for arthralgias (right ankle and foot pain) and joint swelling.  Skin: Negative for color change and wound.  Neurological: Negative for dizziness, numbness and headaches.     Physical Exam Updated Vital Signs BP (!) 157/96   Pulse 77   Ht 5\' 2"  (1.575 m)   Wt 77.1 kg   SpO2 98%   BMI 31.09 kg/m   Physical Exam Vitals signs and nursing note reviewed.  Constitutional:      General: She is not in acute distress.    Appearance: She is well-developed. She is not ill-appearing.  HENT:     Head: Atraumatic.  Neck:     Musculoskeletal: Normal range of motion. No muscular tenderness.  Cardiovascular:     Rate and Rhythm: Normal rate and regular rhythm.     Pulses: Normal pulses.  Pulmonary:     Effort: Pulmonary effort is normal.     Breath sounds: Normal breath sounds.  Musculoskeletal:        General: Swelling and tenderness present. No signs of injury.     Right lower leg: No edema.     Left lower leg: No edema.     Comments: Tenderness to palpation of the dorsal aspect of the distal right foot.  Also mild tenderness to the lateral malleolus.  No bony deformities or significant edema.  No open wound.  Proximal extremity is nontender and compartments are soft  Skin:    General: Skin is warm.     Capillary Refill: Capillary refill takes less than 2 seconds.     Findings: No erythema or rash.  Neurological:     General: No focal deficit present.      Mental Status: She is alert. Mental status is at baseline.     Sensory: No sensory deficit.     Motor: No weakness or abnormal muscle tone.      ED Treatments / Results  Labs (all labs ordered are listed, but only abnormal results are displayed) Labs Reviewed - No data to display  EKG None  Radiology Dg Ankle Complete Right  Result Date: 07/19/2018 CLINICAL DATA:  Fall and twisted right foot and ankle.  Pain. EXAM: RIGHT ANKLE - COMPLETE 3+ VIEW COMPARISON:  Right foot 07/19/2018  FINDINGS: Right ankle is located without ankle fracture. However, there is a subtle fracture involving the cuboid. Fracture appears to be nondisplaced. Enthesopathic changes at the calcaneus. Soft tissue swelling along the lateral aspect of the foot. Irregularity along the lateral aspect of the talus is nonspecific. Question ossification versus fracture along the posterior aspect of the talus on the lateral view. IMPRESSION: 1. Subtle fracture of the cuboid. Refer to the foot radiographs from the same day. 2. Areas of irregularity involving the talus. Difficult to exclude a subtle lateral talus fracture. Electronically Signed   By: Markus Daft M.D.   On: 07/19/2018 10:34   Dg Foot Complete Right  Result Date: 07/19/2018 CLINICAL DATA:  Status post fall. EXAM: RIGHT FOOT COMPLETE - 3+ VIEW COMPARISON:  None. FINDINGS: Linear lucency through the cuboid extending through the distal articular surface is concerning for a nondisplaced fracture. No other fractures are seen. Dorsal midfoot swelling. IMPRESSION: Linear lucency through the cuboid extending through the distal articular surface is concerning for a nondisplaced intra-articular fracture. Electronically Signed   By: Fidela Salisbury M.D.   On: 07/19/2018 10:32    Procedures Procedures (including critical care time)  Medications Ordered in ED Medications  oxyCODONE-acetaminophen (PERCOCET/ROXICET) 5-325 MG per tablet 1 tablet (has no administration in time  range)     Initial Impression / Assessment and Plan / ED Course  I have reviewed the triage vital signs and the nursing notes.  Pertinent labs & imaging results that were available during my care of the patient were reviewed by me and considered in my medical decision making (see chart for details).        Patient with mechanical injury of the right foot that resulted in a fracture of the cuboid bone the fracture appears to be nondisplaced on imaging.  Per radiology, there may also be a possible fracture of the lateral talus.  Extremity is neurovascularly intact.  She appears appropriate for discharge home. Patient placed in a cam walker boot.  She has a walker at home.  She agrees to local orthopedic follow-up, elevate, ice.  Return precautions discussed    Final Clinical Impressions(s) / ED Diagnoses   Final diagnoses:  Closed fracture of right foot, initial encounter    ED Discharge Orders    None       Kem Parkinson, PA-C 07/20/18 1049    Elnora Morrison, MD 07/21/18 2249

## 2018-07-19 NOTE — Discharge Instructions (Addendum)
Elevate and apply ice packs on and off to your foot.  Use your walker at home for weightbearing and keep the cam walker on.  Call Dr. Ruthe Mannan office to arrange a follow-up appointment

## 2018-07-23 ENCOUNTER — Encounter: Payer: Self-pay | Admitting: Orthopedic Surgery

## 2018-07-23 ENCOUNTER — Ambulatory Visit: Payer: Self-pay | Admitting: Orthopedic Surgery

## 2018-08-13 ENCOUNTER — Telehealth: Payer: Self-pay

## 2018-08-13 NOTE — Telephone Encounter (Signed)
Called pt to reschedule TCS w/Propofol w/RMR that was for 08/20/18 d/t COVID-19 restrictions. TCS rescheduled to 10/04/18 at 1:00pm. Endo scheduler informed. Pre-op appt 09/28/18 at 11:00am. Letter mailed with new procedure instructions.

## 2018-08-14 ENCOUNTER — Other Ambulatory Visit: Payer: Self-pay

## 2018-08-14 ENCOUNTER — Encounter (HOSPITAL_COMMUNITY)
Admission: RE | Admit: 2018-08-14 | Discharge: 2018-08-14 | Disposition: A | Discharge status: Home / Self Care | Payer: Medicaid Other | Source: Ambulatory Visit | Attending: Internal Medicine | Admitting: Internal Medicine

## 2018-08-14 DIAGNOSIS — Z01810 Encounter for preprocedural cardiovascular examination: Secondary | ICD-10-CM | POA: Diagnosis not present

## 2018-09-25 ENCOUNTER — Telehealth: Payer: Self-pay

## 2018-09-25 NOTE — Telephone Encounter (Signed)
Called pt yesterday afternoon, TCS w/Propofol w/RMR rescheduled to 11/19/18 at 1:30pm d/t COVID-19 restrictions. Endo scheduler informed. Pre-op appt 11/14/18 at 12:45pm. Appt letter mailed with new procedure instructions.

## 2018-09-28 ENCOUNTER — Other Ambulatory Visit (HOSPITAL_COMMUNITY): Payer: Self-pay

## 2018-10-25 ENCOUNTER — Encounter: Payer: Self-pay | Admitting: Nurse Practitioner

## 2018-10-25 ENCOUNTER — Ambulatory Visit (INDEPENDENT_AMBULATORY_CARE_PROVIDER_SITE_OTHER): Payer: Medicaid Other | Admitting: Nurse Practitioner

## 2018-10-25 ENCOUNTER — Other Ambulatory Visit: Payer: Self-pay

## 2018-10-25 DIAGNOSIS — K580 Irritable bowel syndrome with diarrhea: Secondary | ICD-10-CM

## 2018-10-25 DIAGNOSIS — K649 Unspecified hemorrhoids: Secondary | ICD-10-CM | POA: Diagnosis not present

## 2018-10-25 MED ORDER — DICYCLOMINE HCL 10 MG PO CAPS: 10.0000 mg | ORAL_CAPSULE | Freq: Four times a day (QID) | ORAL | 2 refills | Status: DC | PRN

## 2018-10-25 NOTE — Assessment & Plan Note (Signed)
Persistent hemorrhoid symptoms have gotten progressively worse.  Significant discomfort after a bowel movement, which she has multiple a day, where it is difficult for her to sit.  She is tried hydrocortisone topical therapy which has not helped.  She does have a colonoscopy scheduled for later this month which should help evaluate for banding candidacy.  Her previous colonoscopy noted internal hemorrhoids and there was no mention of external hemorrhoids.  If she does have internal hemorrhoids depending on the number, size, location she may be a candidate for an office hemorrhoid banding which could help improve her symptoms.  In the meantime I will send a prescription for apothecary cream to [facility] for hydrocortisone compounded with lidocaine to see if he can help provide her some relief until her procedure.  Return for follow-up in 2 months.

## 2018-10-25 NOTE — Assessment & Plan Note (Addendum)
IBS symptoms seem to be worsening.  She previously had classic symptoms including longstanding diarrhea/soft stools with associated abdominal pain.  Abdominal pain tended to be a herald of bowel movement.  She still has multiple loose stools a day.  She will complete her stool studies in the next 1 or 2 days.  I will send in a prescription for Bentyl 10 mg up to 4 times a day as needed for abdominal pain backslash or diarrhea see if we can provide her some relief in the interim.  Now her pain has evolved to be more constant, unrelieved by bowel movement and getting worse.  She notes a extensive family history of multiple types of cancer and is not sure colon cancer is one of those.  She is due for colonoscopy which had to be unfortunately postponed a couple times due to the pandemic.  Given her change in symptoms recommend she complete her colonoscopy as scheduled.  I will check a CT of the abdomen and pelvis given her evolving and worsening abdominal pain with family history of multiple cancers.  Return for follow-up in 1 to 2 months.

## 2018-10-25 NOTE — Progress Notes (Signed)
Referring Provider: Caryl Bis, MD Primary Care Physician:  Caryl Bis, MD Primary GI:  Dr. Gala Romney  NOTE: Service was provided via telemedicine and was requested by the patient due to COVID-19 pandemic.  Method of visit: Telephone  Patient Location: Home  Provider Location: Office  Reason for Phone Visit: Follow-up  The patient was consented to phone follow-up via telephone encounter including billing of the encounter (yes/no): Yes  Persons present on the phone encounter, with roles: Husband  Total time (minutes) spent on medical discussion: 22 minutes  Chief Complaint  Patient presents with  . Hemorrhoids    pain and bleeding    HPI:   Theresa Pace is a 58 y.o. female who presents for virtual visit regarding: follow-up on rectal bleeding and hemorrhoids.  Patient was last seen in our office 06/06/2018 for bleeding hemorrhoids, IBS, history of colon polyps.  Noted history of chronic GERD and chronic IBS.  States she was diagnosed with IBS in her early 66s.  Significant abdominal pain that usually triggers a bowel movement and does not improve after a bowel movement.  Has 5-6 stools a day which typically 80% diarrhea and 20% consistent with [place] 5-6.  Intermittent hematochezia most recent episode a couple weeks prior to her last office visit generally in the form of toilet tissue hematochezia.  Has known hemorrhoids with flares resulting in rectal discomfort and difficulty sitting after a bowel movement.  Has tried cream without much improvement uses adult briefs due to intermittent incontinence and accidents.  Last colonoscopy 4-1/2 years ago with Dr. Britta Mccreedy, couple polyps and recommended repeat exam in 4 to 5 years.  Stool studies, colonoscopy, request previous colonoscopy report, follow-up in 4 months.  Preprocedure previous colonoscopy records dated 06/06/2014 which found 2 sessile polyps ranging from 3 to 7 mm in size and moderate internal hemorrhoids.  Polyps were  found to be tubular adenoma on surgical pathology.  Recommended 5-year repeat colonoscopy.  Colonoscopy was scheduled for 08/20/2018 but this was rescheduled due to COVID-19/coronavirus restrictions.  It was rescheduled for 10/04/2018.  This again had to be postponed due to pandemic restrictions.  It is now scheduled for 11/19/2018.  Today she states she's doing ok overall. Still having hemorrhoid symptoms which are quite significant with toilet tissue hematochezia and rectal pain/discomfort. Has previously tried Anusol. Having a bowel movement multiple times a day with IBS-D. Did not collect stool samples. States she will do this ASAP. Still has her gallbladder, no history of pancreatitis. Denies ETOH. Abdominal pain is about the same, crampy, doesn't typically improve after a bowel movement. Pain is "9 out of 1-10 scale."  This is a change for her (pain not associated with a need to defecate). Denies N/V, fever, chill, unintentional weight loss. Denies URI or flu-like symptoms. Denies loss of sense of taste or smell. Denies chest pain, dyspnea, dizziness, lightheadedness, syncope, near syncope. Denies any other upper or lower GI symptoms.  Not sure about family history of cancer. Mother and all four aunts died of various cancers.   Past Medical History:  Diagnosis Date  . Arthritis   . Asthma   . COPD (chronic obstructive pulmonary disease) ([place])   . Depression   . Fibromyalgia   . Frequent urination at night   . GERD (gastroesophageal reflux disease)   . Hypercholesteremia   . IBS (irritable bowel syndrome)   . Tachycardia    treated with Cardizem  . Tendonitis     Past Surgical History:  Procedure Laterality Date  . ABDOMINAL HYSTERECTOMY    . APPENDECTOMY    . KNEE ARTHROPLASTY Right 05/23/2017    Current Outpatient Medications  Medication Sig Dispense Refill  . albuterol (PROVENTIL HFA;VENTOLIN HFA) 108 (90 BASE) MCG/ACT inhaler Inhale 2 puffs into the lungs every 6 (six) hours  as needed for wheezing or shortness of breath.     . amphetamine-dextroamphetamine (ADDERALL XR) 30 MG 24 hr capsule Take 90 mg by mouth every morning.    Marland Kitchen aspirin EC 81 MG tablet Take 81 mg by mouth daily.    . budesonide (PULMICORT) 0.5 MG/2ML nebulizer solution Take 2 mLs (0.5 mg total) by nebulization daily. (Patient taking differently: Take 0.5 mg by nebulization 4 (four) times daily as needed (for shortness of breath or wheezing). ) 50 mL 0  . diltiazem (DILACOR XR) 120 MG 24 hr capsule Take 120 mg by mouth daily.    Marland Kitchen ipratropium-albuterol (DUONEB) 0.5-2.5 (3) MG/3ML SOLN Take 3 mLs by nebulization 4 (four) times daily. (Patient taking differently: Take 3 mLs by nebulization 3 (three) times daily. )    . loperamide (IMODIUM A-D) 2 MG tablet Take 8 mg by mouth daily as needed for diarrhea or loose stools.    Marland Kitchen oxyCODONE-acetaminophen (PERCOCET/ROXICET) 5-325 MG tablet Take 1 tablet by mouth every 4 (four) hours as needed. 10 tablet 0  . pantoprazole (PROTONIX) 40 MG tablet Take 1 tablet (40 mg total) by mouth 2 (two) times daily before a meal. 60 tablet 3  . risperiDONE (RISPERDAL) 0.5 MG tablet Take 0.5 mg by mouth 2 (two) times daily.    . rosuvastatin (CRESTOR) 20 MG tablet Take 20 mg by mouth daily.    . traZODone (DESYREL) 50 MG tablet Take 150 mg by mouth at bedtime.      No current facility-administered medications for this visit.     Allergies as of 10/25/2018 - Review Complete 10/25/2018  Allergen Reaction Noted  . Methadone Hives 11/21/2012  . Aspirin Other (See Comments) 02/21/2012    Family History  Problem Relation Age of Onset  . Colon cancer Neg Hx     Social History   Socioeconomic History  . Marital status: Married    Spouse name: Not on file  . Number of children: Not on file  . Years of education: Not on file  . Highest education level: Not on file  Occupational History  . Not on file  Social Needs  . Financial resource strain: Not on file  . Food  insecurity:    Worry: Not on file    Inability: Not on file  . Transportation needs:    Medical: Not on file    Non-medical: Not on file  Tobacco Use  . Smoking status: Former Smoker    Packs/day: 0.25    Types: Cigarettes  . Smokeless tobacco: Never Used  Substance and Sexual Activity  . Alcohol use: No  . Drug use: No  . Sexual activity: Yes    Birth control/protection: Surgical  Lifestyle  . Physical activity:    Days per week: Not on file    Minutes per session: Not on file  . Stress: Not on file  Relationships  . Social connections:    Talks on phone: Not on file    Gets together: Not on file    Attends religious service: Not on file    Active member of club or organization: Not on file    Attends meetings of clubs or organizations: Not  on file    Relationship status: Not on file  Other Topics Concern  . Not on file  Social History Narrative  . Not on file    Review of Systems: General: Negative for anorexia, weight loss, fever, chills, fatigue, weakness. ENT: Negative Rourfor hoarseness, difficulty swallowing. CV: Negative for chest pain, angina, palpitations, peripheral edema.  Respiratory: Negative for dyspnea at rest, cough, sputum, wheezing.  GI: See history of present illness. Endo: Negative for unusual weight change.  Heme: Negative for bruising or bleeding. Allergy: Negative for rash or hives.  Physical Exam: Note: limited exam due to virtual visit General:   Alert and oriented. Pleasant and cooperative. Ears:  Normal auditory acuity. Skin:  Intact without facial significant lesions or rashes. Neurologic:  Alert and oriented x4 Psych:  Alert and cooperative. Normal mood and affect.

## 2018-10-25 NOTE — Patient Instructions (Addendum)
Your health issues we discussed today were:   Diarrhea: 1. Complete your stool studies 1 year 15 2. Have you submit your stool study for the lab 4 times a day as needed for abdominal/your diarrhea have your colonoscopy completed when it scheduled 3. Follow-up in 2 months  Hemorrhoids: 1. Send a prescription to [facility] for rectal cream that has been of a steroid and lidocaine to help with your hemorrhoid symptoms 2. Have your colonoscopy completed 3. If you are a candidate for hemorrhoid banding we can make further discussions after your procedure  Abdominal pain: 1. Because your abdominal pain has changed, become worse, becoming more constant and with your unknown family history of multiple cancers let us plan on checking a CT of your abdomen and pelvis. 2. Radiology will call you to schedule this 3. Call us if you have any worsening or severe symptoms   Overall I recommend:  1. Continue your other current medications 2. Call us if you have any questions or concerns 3. Follow-up in 2 months.   Because of recent events of COVID-19 ("Coronavirus"), follow CDC recommendations:  1. Wash your hand frequently 2. Avoid touching your face 3. Stay away from people who are sick 4. If you have symptoms such as fever, cough, shortness of breath then call your healthcare provider for further guidance 5. If you are sick, STAY AT HOME unless otherwise directed by your healthcare provider. 6. Follow directions from state and national officials regarding staying safe   At [facility] we value your feedback. You may receive a survey about your visit today. Please share your experience as we strive to create trusting relationships with our patients to provide genuine, compassionate, quality care.  We appreciate your understanding and patience as we review any laboratory studies, imaging, and other diagnostic tests that are ordered as we care for you. Our office policy is  5 business days for review of these results, and any emergent or urgent results are addressed in a timely manner for your best interest. If you do not hear from our office in 1 week, please contact us.   We also encourage the use of MyChart, which contains your medical information for your review as well. If you are not enrolled in this feature, an access code is on this after visit summary for your convenience. Thank you for allowing [place] to be involved in your care.  It was great to see you today!  I hope you have a great summer!!

## 2018-10-26 ENCOUNTER — Telehealth: Payer: Self-pay | Admitting: *Deleted

## 2018-10-26 ENCOUNTER — Encounter: Payer: Self-pay | Admitting: Internal Medicine

## 2018-10-26 NOTE — Telephone Encounter (Signed)
CT abd/pelv w/ contrast scheduled for 6/18 at 4pm, arrival time 3:45pm, npo 4 hrs prior, p/u oral contrast.  Called patient and she is aware of appt details. She voiced understanding.   PA for CT is pending approval via [employer] website. Clinicals faxed.

## 2018-10-26 NOTE — Progress Notes (Signed)
cc'd to pcp 

## 2018-10-29 ENCOUNTER — Telehealth: Payer: Self-pay | Admitting: Internal Medicine

## 2018-10-29 NOTE — Telephone Encounter (Signed)
PA was approved [id number] dates 10/26/2018-04/24/2019

## 2018-10-29 NOTE — Telephone Encounter (Signed)
fowarding to EG as dysphagia was not mentioned at last OV with EG

## 2018-10-29 NOTE — Telephone Encounter (Signed)
[facility] CALLED [phone] AND SAID THIS PATIENT IS A MUTUAL PATIENT AND THE DOCTOR WOULD LIKE RMR TO DO AN ENDO AS WELL AS HER TCS, THEY STATED SHE IS ALSO HAVING DYSPHAGIA

## 2018-11-08 ENCOUNTER — Ambulatory Visit (HOSPITAL_COMMUNITY): Payer: Medicaid Other | Attending: Nurse Practitioner

## 2018-11-13 ENCOUNTER — Encounter (HOSPITAL_COMMUNITY): Payer: Self-pay

## 2018-11-13 ENCOUNTER — Other Ambulatory Visit: Payer: Self-pay

## 2018-11-14 ENCOUNTER — Encounter (HOSPITAL_COMMUNITY)
Admission: RE | Admit: 2018-11-14 | Discharge: 2018-11-14 | Disposition: A | Discharge status: Home / Self Care | Payer: Medicaid Other | Source: Ambulatory Visit | Attending: Internal Medicine | Admitting: Internal Medicine

## 2018-11-15 ENCOUNTER — Other Ambulatory Visit (HOSPITAL_COMMUNITY)
Admission: RE | Admit: 2018-11-15 | Discharge: 2018-11-15 | Disposition: A | Discharge status: Home / Self Care | Payer: Medicaid Other | Source: Ambulatory Visit | Attending: Internal Medicine | Admitting: Internal Medicine

## 2018-11-15 ENCOUNTER — Other Ambulatory Visit: Payer: Self-pay

## 2018-11-15 DIAGNOSIS — Z1159 Encounter for screening for other viral diseases: Secondary | ICD-10-CM | POA: Insufficient documentation

## 2018-11-17 LAB — NOVEL CORONAVIRUS, NAA (HOSP ORDER, SEND-OUT TO REF LAB; TAT 18-24 HRS): SARS-CoV-2, NAA: NOT DETECTED

## 2018-11-19 ENCOUNTER — Encounter (HOSPITAL_COMMUNITY): Payer: Self-pay | Admitting: Anesthesiology

## 2018-11-19 ENCOUNTER — Telehealth: Payer: Self-pay | Admitting: *Deleted

## 2018-11-19 MED ORDER — CLENPIQ 10-3.5-12 MG-GM -GM/160ML PO SOLN: 1.0000 | Freq: Once | ORAL | 0 refills | Status: AC

## 2018-11-19 NOTE — Addendum Note (Signed)
Addended by: Inge Rise on: 11/19/2018 10:30 AM   Modules accepted: Orders

## 2018-11-19 NOTE — Telephone Encounter (Addendum)
Called patient. She states she did not pick up prep that she was suppose to drink. The pharmacy advised they didn't have Rx either. I advised patient Rx was sent in when she was originally scheduled in February. Patient states she never went to get Rx. She instead took to 2 dulcolax pills. I advised patient will need to reschedule. She is now on for 8/6 at 2:15pm. Patient aware I will send a new rx for prep to the pharmacy and she needs to pick this up to do her prep. She voiced understanding. Called endo and LMOVM making aware.   Patient also aware will mail new pre-op appt with instructions

## 2018-11-19 NOTE — Telephone Encounter (Signed)
Received VM from patient that she was scheduled for TCS today but was not sure if she was supposed to drink anything. Called-LMOVM

## 2018-11-28 NOTE — Telephone Encounter (Signed)
Please call and ask [facility] for documentation about dysphagia as this wasn't mentioned in her OV. I'm not sure if we can just add an EGD +/- dilation to her TCS (and if so, it may impact scheduling). I may need to speak with the patient to discuss dysphagia.

## 2018-11-28 NOTE — Telephone Encounter (Signed)
Noted. Spoke with receptionist at 50. They will fax notes about Dysphasia over.

## 2018-11-29 NOTE — Telephone Encounter (Signed)
Notes  Received from [facility] and given to EG.

## 2018-12-24 ENCOUNTER — Encounter (HOSPITAL_COMMUNITY): Payer: Self-pay

## 2018-12-24 ENCOUNTER — Other Ambulatory Visit: Payer: Self-pay

## 2018-12-24 ENCOUNTER — Encounter (HOSPITAL_COMMUNITY)
Admission: RE | Admit: 2018-12-24 | Discharge: 2018-12-24 | Disposition: A | Discharge status: Home / Self Care | Payer: Medicaid Other | Source: Ambulatory Visit | Attending: Internal Medicine | Admitting: Internal Medicine

## 2018-12-24 ENCOUNTER — Other Ambulatory Visit (HOSPITAL_COMMUNITY)
Admission: RE | Admit: 2018-12-24 | Discharge: 2018-12-24 | Disposition: A | Discharge status: Home / Self Care | Payer: Medicaid Other | Source: Ambulatory Visit | Attending: Internal Medicine | Admitting: Internal Medicine

## 2018-12-24 DIAGNOSIS — Z20828 Contact with and (suspected) exposure to other viral communicable diseases: Secondary | ICD-10-CM | POA: Diagnosis not present

## 2018-12-24 DIAGNOSIS — Z01812 Encounter for preprocedural laboratory examination: Secondary | ICD-10-CM | POA: Diagnosis present

## 2018-12-24 LAB — SARS CORONAVIRUS 2 (TAT 6-24 HRS): SARS Coronavirus 2: NEGATIVE

## 2018-12-25 ENCOUNTER — Telehealth: Payer: Self-pay | Admitting: *Deleted

## 2018-12-25 ENCOUNTER — Inpatient Hospital Stay (HOSPITAL_COMMUNITY)
Admission: EM | Admit: 2018-12-25 | Discharge: 2018-12-29 | DRG: 070 | Disposition: A | Discharge status: Home Health | Payer: Medicaid Other | Attending: Internal Medicine | Admitting: Internal Medicine

## 2018-12-25 DIAGNOSIS — R569 Unspecified convulsions: Secondary | ICD-10-CM

## 2018-12-25 DIAGNOSIS — G9340 Encephalopathy, unspecified: Secondary | ICD-10-CM | POA: Diagnosis present

## 2018-12-25 DIAGNOSIS — Z7982 Long term (current) use of aspirin: Secondary | ICD-10-CM

## 2018-12-25 DIAGNOSIS — G40409 Other generalized epilepsy and epileptic syndromes, not intractable, without status epilepticus: Secondary | ICD-10-CM | POA: Diagnosis present

## 2018-12-25 DIAGNOSIS — E785 Hyperlipidemia, unspecified: Secondary | ICD-10-CM | POA: Diagnosis present

## 2018-12-25 DIAGNOSIS — F323 Major depressive disorder, single episode, severe with psychotic features: Secondary | ICD-10-CM | POA: Diagnosis present

## 2018-12-25 DIAGNOSIS — Z90710 Acquired absence of both cervix and uterus: Secondary | ICD-10-CM

## 2018-12-25 DIAGNOSIS — M797 Fibromyalgia: Secondary | ICD-10-CM | POA: Diagnosis present

## 2018-12-25 DIAGNOSIS — W19XXXA Unspecified fall, initial encounter: Secondary | ICD-10-CM

## 2018-12-25 DIAGNOSIS — J9601 Acute respiratory failure with hypoxia: Secondary | ICD-10-CM | POA: Diagnosis present

## 2018-12-25 DIAGNOSIS — R4182 Altered mental status, unspecified: Secondary | ICD-10-CM

## 2018-12-25 DIAGNOSIS — G4733 Obstructive sleep apnea (adult) (pediatric): Secondary | ICD-10-CM | POA: Diagnosis present

## 2018-12-25 DIAGNOSIS — Z886 Allergy status to analgesic agent status: Secondary | ICD-10-CM

## 2018-12-25 DIAGNOSIS — I10 Essential (primary) hypertension: Secondary | ICD-10-CM | POA: Diagnosis present

## 2018-12-25 DIAGNOSIS — Z96651 Presence of right artificial knee joint: Secondary | ICD-10-CM | POA: Diagnosis present

## 2018-12-25 DIAGNOSIS — Z87891 Personal history of nicotine dependence: Secondary | ICD-10-CM

## 2018-12-25 DIAGNOSIS — E663 Overweight: Secondary | ICD-10-CM | POA: Diagnosis present

## 2018-12-25 DIAGNOSIS — R9431 Abnormal electrocardiogram [ECG] [EKG]: Secondary | ICD-10-CM

## 2018-12-25 DIAGNOSIS — G894 Chronic pain syndrome: Secondary | ICD-10-CM | POA: Diagnosis present

## 2018-12-25 DIAGNOSIS — Z20828 Contact with and (suspected) exposure to other viral communicable diseases: Secondary | ICD-10-CM | POA: Diagnosis present

## 2018-12-25 DIAGNOSIS — K589 Irritable bowel syndrome without diarrhea: Secondary | ICD-10-CM | POA: Diagnosis present

## 2018-12-25 DIAGNOSIS — Z888 Allergy status to other drugs, medicaments and biological substances status: Secondary | ICD-10-CM

## 2018-12-25 DIAGNOSIS — E876 Hypokalemia: Secondary | ICD-10-CM

## 2018-12-25 DIAGNOSIS — R7303 Prediabetes: Secondary | ICD-10-CM | POA: Diagnosis present

## 2018-12-25 DIAGNOSIS — R739 Hyperglycemia, unspecified: Secondary | ICD-10-CM | POA: Diagnosis present

## 2018-12-25 DIAGNOSIS — S8251XA Displaced fracture of medial malleolus of right tibia, initial encounter for closed fracture: Secondary | ICD-10-CM | POA: Diagnosis present

## 2018-12-25 DIAGNOSIS — Z79899 Other long term (current) drug therapy: Secondary | ICD-10-CM

## 2018-12-25 DIAGNOSIS — E7800 Pure hypercholesterolemia, unspecified: Secondary | ICD-10-CM | POA: Diagnosis present

## 2018-12-25 DIAGNOSIS — D72829 Elevated white blood cell count, unspecified: Secondary | ICD-10-CM | POA: Diagnosis present

## 2018-12-25 DIAGNOSIS — R4702 Dysphasia: Secondary | ICD-10-CM | POA: Diagnosis present

## 2018-12-25 DIAGNOSIS — Z7951 Long term (current) use of inhaled steroids: Secondary | ICD-10-CM

## 2018-12-25 DIAGNOSIS — K219 Gastro-esophageal reflux disease without esophagitis: Secondary | ICD-10-CM | POA: Diagnosis present

## 2018-12-25 DIAGNOSIS — R7309 Other abnormal glucose: Secondary | ICD-10-CM

## 2018-12-25 DIAGNOSIS — J449 Chronic obstructive pulmonary disease, unspecified: Secondary | ICD-10-CM | POA: Diagnosis present

## 2018-12-25 DIAGNOSIS — T148XXA Other injury of unspecified body region, initial encounter: Secondary | ICD-10-CM

## 2018-12-25 DIAGNOSIS — G9341 Metabolic encephalopathy: Principal | ICD-10-CM | POA: Diagnosis present

## 2018-12-25 NOTE — Telephone Encounter (Signed)
Called patient. Endo wanted to see if she could move procedure time up for Thursday as we had cancellations. Patient was agreeable to moving up to 8:30am. Aware she will need to arrive at 7am. She will drink 2nd half of prep at 3:30am, npo after 5:30am. She voiced understanding. Called endo and LMOVM making aware.

## 2018-12-26 ENCOUNTER — Emergency Department (HOSPITAL_COMMUNITY): Payer: Medicaid Other

## 2018-12-26 ENCOUNTER — Telehealth: Payer: Self-pay | Admitting: *Deleted

## 2018-12-26 ENCOUNTER — Other Ambulatory Visit: Payer: Self-pay

## 2018-12-26 ENCOUNTER — Observation Stay (HOSPITAL_COMMUNITY): Payer: Medicaid Other

## 2018-12-26 ENCOUNTER — Observation Stay (HOSPITAL_COMMUNITY)
Admit: 2018-12-26 | Discharge: 2018-12-26 | Disposition: A | Discharge status: Home / Self Care | Payer: Medicaid Other | Attending: Internal Medicine | Admitting: Internal Medicine

## 2018-12-26 ENCOUNTER — Encounter (HOSPITAL_COMMUNITY): Payer: Self-pay | Admitting: Emergency Medicine

## 2018-12-26 DIAGNOSIS — R4182 Altered mental status, unspecified: Secondary | ICD-10-CM | POA: Diagnosis not present

## 2018-12-26 DIAGNOSIS — R7309 Other abnormal glucose: Secondary | ICD-10-CM | POA: Insufficient documentation

## 2018-12-26 DIAGNOSIS — R569 Unspecified convulsions: Secondary | ICD-10-CM | POA: Diagnosis not present

## 2018-12-26 DIAGNOSIS — R9431 Abnormal electrocardiogram [ECG] [EKG]: Secondary | ICD-10-CM | POA: Insufficient documentation

## 2018-12-26 DIAGNOSIS — E876 Hypokalemia: Secondary | ICD-10-CM | POA: Diagnosis not present

## 2018-12-26 LAB — BLOOD GAS, ARTERIAL
Acid-base deficit: 0.4 mmol/L (ref 0.0–2.0)
Bicarbonate: 23.7 mmol/L (ref 20.0–28.0)
Drawn by: 22223
FIO2: 28
O2 Saturation: 97.7 %
Patient temperature: 37
pCO2 arterial: 44.2 mmHg (ref 32.0–48.0)
pH, Arterial: 7.359 (ref 7.350–7.450)
pO2, Arterial: 119 mmHg — ABNORMAL HIGH (ref 83.0–108.0)

## 2018-12-26 LAB — CBC WITH DIFFERENTIAL/PLATELET
Abs Immature Granulocytes: 0.11 10*3/uL — ABNORMAL HIGH (ref 0.00–0.07)
Basophils Absolute: 0.1 10*3/uL (ref 0.0–0.1)
Basophils Relative: 0 %
Eosinophils Absolute: 0.1 10*3/uL (ref 0.0–0.5)
Eosinophils Relative: 0 %
HCT: 47.8 % — ABNORMAL HIGH (ref 36.0–46.0)
Hemoglobin: 15.5 g/dL — ABNORMAL HIGH (ref 12.0–15.0)
Immature Granulocytes: 1 %
Lymphocytes Relative: 14 %
Lymphs Abs: 2.1 10*3/uL (ref 0.7–4.0)
MCH: 29.9 pg (ref 26.0–34.0)
MCHC: 32.4 g/dL (ref 30.0–36.0)
MCV: 92.3 fL (ref 80.0–100.0)
Monocytes Absolute: 0.8 10*3/uL (ref 0.1–1.0)
Monocytes Relative: 5 %
Neutro Abs: 12.6 10*3/uL — ABNORMAL HIGH (ref 1.7–7.7)
Neutrophils Relative %: 80 %
Platelets: 291 10*3/uL (ref 150–400)
RBC: 5.18 MIL/uL — ABNORMAL HIGH (ref 3.87–5.11)
RDW: 14.1 % (ref 11.5–15.5)
WBC: 15.9 10*3/uL — ABNORMAL HIGH (ref 4.0–10.5)
nRBC: 0 % (ref 0.0–0.2)

## 2018-12-26 LAB — URINALYSIS, ROUTINE W REFLEX MICROSCOPIC
Bilirubin Urine: NEGATIVE
Glucose, UA: NEGATIVE mg/dL
Ketones, ur: NEGATIVE mg/dL
Leukocytes,Ua: NEGATIVE
Nitrite: NEGATIVE
Protein, ur: NEGATIVE mg/dL
Specific Gravity, Urine: 1.024 (ref 1.005–1.030)
pH: 5 (ref 5.0–8.0)

## 2018-12-26 LAB — RAPID URINE DRUG SCREEN, HOSP PERFORMED
Amphetamines: POSITIVE — AB
Barbiturates: NOT DETECTED
Benzodiazepines: POSITIVE — AB
Cocaine: NOT DETECTED
Opiates: POSITIVE — AB
Tetrahydrocannabinol: POSITIVE — AB

## 2018-12-26 LAB — HEMOGLOBIN A1C
Hgb A1c MFr Bld: 6.1 % — ABNORMAL HIGH (ref 4.8–5.6)
Mean Plasma Glucose: 128.37 mg/dL

## 2018-12-26 LAB — COMPREHENSIVE METABOLIC PANEL
ALT: 16 U/L (ref 0–44)
AST: 12 U/L — ABNORMAL LOW (ref 15–41)
Albumin: 4 g/dL (ref 3.5–5.0)
Alkaline Phosphatase: 80 U/L (ref 38–126)
Anion gap: 12 (ref 5–15)
BUN: 16 mg/dL (ref 6–20)
CO2: 21 mmol/L — ABNORMAL LOW (ref 22–32)
Calcium: 8.9 mg/dL (ref 8.9–10.3)
Chloride: 105 mmol/L (ref 98–111)
Creatinine, Ser: 1.08 mg/dL — ABNORMAL HIGH (ref 0.44–1.00)
GFR calc Af Amer: >60 mL/min (ref >60)
GFR calc non Af Amer: 57 mL/min — ABNORMAL LOW (ref >60)
Glucose, Bld: 215 mg/dL — ABNORMAL HIGH (ref 70–99)
Potassium: 3.3 mmol/L — ABNORMAL LOW (ref 3.5–5.1)
Sodium: 138 mmol/L (ref 135–145)
Total Bilirubin: 0.8 mg/dL (ref 0.3–1.2)
Total Protein: 6.9 g/dL (ref 6.5–8.1)

## 2018-12-26 LAB — ETHANOL: Alcohol, Ethyl (B): <10 mg/dL (ref <10)

## 2018-12-26 LAB — CBG MONITORING, ED
Glucose-Capillary: 162 mg/dL — ABNORMAL HIGH (ref 70–99)
Glucose-Capillary: 117 mg/dL — ABNORMAL HIGH (ref 70–99)
Glucose-Capillary: 97 mg/dL (ref 70–99)
Glucose-Capillary: 124 mg/dL — ABNORMAL HIGH (ref 70–99)
Glucose-Capillary: 142 mg/dL — ABNORMAL HIGH (ref 70–99)

## 2018-12-26 LAB — TSH: TSH: 2.719 u[IU]/mL (ref 0.350–4.500)

## 2018-12-26 LAB — MAGNESIUM: Magnesium: 2.2 mg/dL (ref 1.7–2.4)

## 2018-12-26 LAB — GLUCOSE, CAPILLARY: Glucose-Capillary: 122 mg/dL — ABNORMAL HIGH (ref 70–99)

## 2018-12-26 LAB — LACTIC ACID, PLASMA: Lactic Acid, Venous: 1.1 mmol/L (ref 0.5–1.9)

## 2018-12-26 LAB — AMMONIA: Ammonia: 26 umol/L (ref 9–35)

## 2018-12-26 LAB — SARS CORONAVIRUS 2 BY RT PCR (HOSPITAL ORDER, PERFORMED IN ~~LOC~~ HOSPITAL LAB): SARS Coronavirus 2: NEGATIVE

## 2018-12-26 LAB — VITAMIN B12: Vitamin B-12: 190 pg/mL (ref 180–914)

## 2018-12-26 LAB — CK: Total CK: 61 U/L (ref 38–234)

## 2018-12-26 MED ORDER — POLYETHYLENE GLYCOL 3350 17 G PO PACK: 17.0000 g | PACK | Freq: Every day | ORAL | Status: DC | PRN

## 2018-12-26 MED ORDER — SENNA 8.6 MG PO TABS
1.0000 | ORAL_TABLET | Freq: Two times a day (BID) | ORAL | Status: DC
  Administered 2018-12-27 – 2018-12-29 (×5): 8.6 mg via ORAL
  Filled 2018-12-26 (×10): qty 1

## 2018-12-26 MED ORDER — RISPERIDONE 0.5 MG PO TABS
0.5000 mg | ORAL_TABLET | Freq: Two times a day (BID) | ORAL | Status: DC
  Administered 2018-12-27 – 2018-12-29 (×5): 0.5 mg via ORAL
  Filled 2018-12-26 (×5): qty 1

## 2018-12-26 MED ORDER — SODIUM CHLORIDE 0.9 % IV SOLN
75.0000 mL/h | INTRAVENOUS | Status: DC
  Administered 2018-12-26 – 2018-12-28 (×5): 75 mL/h via INTRAVENOUS

## 2018-12-26 MED ORDER — LEVETIRACETAM IN NACL 500 MG/100ML IV SOLN
500.0000 mg | Freq: Two times a day (BID) | INTRAVENOUS | Status: DC
  Administered 2018-12-26 – 2018-12-27 (×3): 500 mg via INTRAVENOUS
  Filled 2018-12-26 (×6): qty 100

## 2018-12-26 MED ORDER — ROSUVASTATIN CALCIUM 20 MG PO TABS
20.0000 mg | ORAL_TABLET | Freq: Every day | ORAL | Status: DC
  Administered 2018-12-27 – 2018-12-29 (×3): 20 mg via ORAL
  Filled 2018-12-26 (×6): qty 1

## 2018-12-26 MED ORDER — ALBUTEROL SULFATE HFA 108 (90 BASE) MCG/ACT IN AERS
2.0000 | INHALATION_SPRAY | Freq: Four times a day (QID) | RESPIRATORY_TRACT | Status: DC | PRN
  Filled 2018-12-26: qty 6.7

## 2018-12-26 MED ORDER — SODIUM CHLORIDE 0.9 % IV BOLUS
1000.0000 mL | Freq: Once | INTRAVENOUS | Status: AC
  Administered 2018-12-26: 1000 mL via INTRAVENOUS

## 2018-12-26 MED ORDER — METOPROLOL TARTRATE 5 MG/5ML IV SOLN
5.0000 mg | Freq: Three times a day (TID) | INTRAVENOUS | Status: AC
  Administered 2018-12-26 – 2018-12-27 (×4): 5 mg via INTRAVENOUS
  Filled 2018-12-26 (×5): qty 5

## 2018-12-26 MED ORDER — INSULIN ASPART 100 UNIT/ML ~~LOC~~ SOLN
0.0000 [IU] | Freq: Three times a day (TID) | SUBCUTANEOUS | Status: DC
  Administered 2018-12-26: 18:00:00 1 [IU] via SUBCUTANEOUS
  Administered 2018-12-27: 2 [IU] via SUBCUTANEOUS
  Administered 2018-12-29: 09:00:00 1 [IU] via SUBCUTANEOUS
  Filled 2018-12-26: qty 1

## 2018-12-26 MED ORDER — BUDESONIDE 0.5 MG/2ML IN SUSP
0.5000 mg | Freq: Every day | RESPIRATORY_TRACT | Status: DC
  Administered 2018-12-26 – 2018-12-29 (×4): 0.5 mg via RESPIRATORY_TRACT
  Filled 2018-12-26 (×4): qty 2

## 2018-12-26 MED ORDER — DICYCLOMINE HCL 10 MG PO CAPS: 10.0000 mg | ORAL_CAPSULE | Freq: Four times a day (QID) | ORAL | Status: DC | PRN

## 2018-12-26 MED ORDER — LORAZEPAM 2 MG/ML IJ SOLN: 4.0000 mg | INTRAMUSCULAR | Status: AC

## 2018-12-26 MED ORDER — ENOXAPARIN SODIUM 40 MG/0.4ML ~~LOC~~ SOLN
40.0000 mg | SUBCUTANEOUS | Status: DC
  Administered 2018-12-26 – 2018-12-29 (×4): 40 mg via SUBCUTANEOUS
  Filled 2018-12-26 (×4): qty 0.4

## 2018-12-26 MED ORDER — BISACODYL 10 MG RE SUPP: 10.0000 mg | Freq: Every day | RECTAL | Status: DC | PRN

## 2018-12-26 MED ORDER — METOCLOPRAMIDE HCL 5 MG/ML IJ SOLN: 10.0000 mg | Freq: Four times a day (QID) | INTRAMUSCULAR | Status: DC | PRN

## 2018-12-26 MED ORDER — LEVETIRACETAM IN NACL 1000 MG/100ML IV SOLN
1000.0000 mg | Freq: Once | INTRAVENOUS | Status: AC
  Administered 2018-12-26: 1000 mg via INTRAVENOUS
  Filled 2018-12-26: qty 100

## 2018-12-26 MED ORDER — LEVETIRACETAM IN NACL 1000 MG/100ML IV SOLN
INTRAVENOUS | Status: AC
  Filled 2018-12-26: qty 100

## 2018-12-26 MED ORDER — POTASSIUM CHLORIDE 10 MEQ/100ML IV SOLN
10.0000 meq | INTRAVENOUS | Status: AC
  Administered 2018-12-26 (×2): 10 meq via INTRAVENOUS
  Filled 2018-12-26 (×2): qty 100

## 2018-12-26 MED ORDER — DILTIAZEM HCL ER 120 MG PO CP24
120.0000 mg | ORAL_CAPSULE | Freq: Every day | ORAL | Status: DC
  Filled 2018-12-26 (×3): qty 1

## 2018-12-26 NOTE — Telephone Encounter (Signed)
Noted. Patient scheduled with EG for OV and we can reschedule when she comes back in

## 2018-12-26 NOTE — ED Notes (Signed)
Husband updated.

## 2018-12-26 NOTE — ED Provider Notes (Signed)
[facility] EMERGENCY DEPARTMENT Provider Note   CSN: [id number] Arrival date & time: 12/25/18  2352    History   Chief Complaint Chief Complaint  Patient presents with   Altered Mental Status    HPI Theresa Pace is a 58 y.o. female.   The history is provided by the EMS personnel. The history is limited by the condition of the patient (Altered mental status).  Altered Mental Status She has history COPD, hyperlipidemia, chronic pain and is brought in by ambulance after possibly having had a seizure.  Per EMS, husband had stated that she had fallen and he thought he saw seizure-like activity.  EMS reports patient was combative and did require sedation with midazolam.  She initially was having some intermittent seizure-like activity which stopped following midazolam.  She does not have any known history of drug abuse.  Past Medical History:  Diagnosis Date   Arthritis    Asthma    COPD (chronic obstructive pulmonary disease) (HCC)    Depression    Fibromyalgia    Frequent urination at night    GERD (gastroesophageal reflux disease)    Hypercholesteremia    IBS (irritable bowel syndrome)    Tachycardia    treated with Cardizem   Tendonitis     Patient Active Problem List   Diagnosis Date Noted   IBS (irritable bowel syndrome) 06/06/2018   History of colon polyps 06/06/2018   Acute respiratory distress 07/07/2016   Asthma exacerbation 07/07/2016   Sinus tachycardia 10/22/2015   Acute exacerbation of chronic obstructive pulmonary disease (COPD) ([place]) 10/21/2015   Anxiety 10/21/2015   GERD (gastroesophageal reflux disease) 10/21/2015   Chronic pain 10/21/2015   Hyperlipidemia 10/21/2015   Acute respiratory failure with hypoxia ([place]) 02/18/2015   COPD (chronic obstructive pulmonary disease) ([place]) 02/16/2015   COPD exacerbation ([place]) 02/16/2015   Mycoplasma pneumonia 02/16/2015   Seizure ([place]) 02/22/2012   Bleeding hemorrhoid 02/22/2012    Acute encephalopathy 02/21/2012   Asthma 02/21/2012   Depression 02/21/2012    Past Surgical History:  Procedure Laterality Date   ABDOMINAL HYSTERECTOMY     APPENDECTOMY     KNEE ARTHROPLASTY Right 05/23/2017     OB History   No obstetric history on file.      Home Medications    Prior to Admission medications   Medication Sig Start Date End Date Taking? Authorizing Provider  albuterol (PROVENTIL HFA;VENTOLIN HFA) 108 (90 BASE) MCG/ACT inhaler Inhale 2 puffs into the lungs every 6 (six) hours as needed for wheezing or shortness of breath.     [provider]  amphetamine-dextroamphetamine (ADDERALL XR) 30 MG 24 hr capsule Take 90 mg by mouth every morning.    [provider]  aspirin EC 81 MG tablet Take 81 mg by mouth daily.    [provider]  budesonide (PULMICORT) 0.5 MG/2ML nebulizer solution Take 2 mLs (0.5 mg total) by nebulization daily. Patient taking differently: Take 0.5 mg by nebulization 4 (four) times daily as needed (for shortness of breath or wheezing).  06/27/16   Isla Pence, MD  dicyclomine (BENTYL) 10 MG capsule Take 1 capsule (10 mg total) by mouth 4 (four) times daily as needed for spasms (or diarrhea). 10/25/18   Carlis Stable, NP  diltiazem (DILACOR XR) 120 MG 24 hr capsule Take 120 mg by mouth daily.    [provider]  ipratropium-albuterol (DUONEB) 0.5-2.5 (3) MG/3ML SOLN Take 3 mLs by nebulization 4 (four) times daily. Patient taking differently: Take 3  mLs by nebulization 3 (three) times daily.  02/20/15   Samuella Cota, MD  loperamide (IMODIUM A-D) 2 MG tablet Take 8 mg by mouth daily as needed for diarrhea or loose stools.    [provider]  oxyCODONE-acetaminophen (PERCOCET/ROXICET) 5-325 MG tablet Take 1 tablet by mouth every 4 (four) hours as needed. 07/19/18   Triplett, Tammy, PA-C  pantoprazole (PROTONIX) 40 MG tablet Take 1 tablet (40 mg total) by mouth 2 (two) times daily before a meal.  07/11/16   Orvan Falconer, MD  risperiDONE (RISPERDAL) 0.5 MG tablet Take 0.5 mg by mouth 2 (two) times daily.    [provider]  rosuvastatin (CRESTOR) 20 MG tablet Take 20 mg by mouth daily.    [provider]  traZODone (DESYREL) 50 MG tablet Take 150 mg by mouth at bedtime.     [provider]    Family History Family History  Problem Relation Age of Onset   Colon cancer Neg Hx     Social History Social History   Tobacco Use   Smoking status: Former Smoker    Packs/day: 0.25    Types: Cigarettes   Smokeless tobacco: Never Used  Substance Use Topics   Alcohol use: No   Drug use: No     Allergies   Methadone and Aspirin   Review of Systems Review of Systems  Unable to perform ROS: Mental status change     Physical Exam Updated Vital Signs BP 97/63    Pulse 83    Temp 97.6 F (36.4 C) (Rectal)    Resp 16    SpO2 95%   Physical Exam Vitals signs and nursing note reviewed.    58 year old female, responsive only to deep painful stimuli. Vital signs are normql. Oxygen saturation is 95%, which is normal. Head is normocephalic and atraumatic.  Pupils are 5 mm and reactive. Oropharynx is clear. Neck is nontender and supple without adenopathy or JVD. Back is nontender and there is no CVA tenderness. Lungs are clear without rales, wheezes, or rhonchi. Chest is nontender. Heart has regular rate and rhythm without murmur. Abdomen is soft, flat, nontender without masses or hepatosplenomegaly and peristalsis is normoactive. Extremities have no cyanosis or edema, full range of motion is present. Skin is warm and dry without rash. Neurologic: Responsive only to deep painful stimuli, but does move both arms purposefully and equally.  ED Treatments / Results  Labs (all labs ordered are listed, but only abnormal results are displayed) Labs Reviewed  COMPREHENSIVE METABOLIC PANEL - Abnormal; Notable for the following components:      Result Value    Potassium 3.3 (*)    CO2 21 (*)    Glucose, Bld 215 (*)    Creatinine, Ser 1.08 (*)    AST 12 (*)    GFR calc non Af Amer 57 (*)    All other components within normal limits  CBC WITH DIFFERENTIAL/PLATELET - Abnormal; Notable for the following components:   WBC 15.9 (*)    RBC 5.18 (*)    Hemoglobin 15.5 (*)    HCT 47.8 (*)    Neutro Abs 12.6 (*)    Abs Immature Granulocytes 0.11 (*)    All other components within normal limits  RAPID URINE DRUG SCREEN, HOSP PERFORMED - Abnormal; Notable for the following components:   Opiates POSITIVE (*)    Benzodiazepines POSITIVE (*)    Amphetamines POSITIVE (*)    Tetrahydrocannabinol POSITIVE (*)    All  other components within normal limits  URINALYSIS, ROUTINE W REFLEX MICROSCOPIC - Abnormal; Notable for the following components:   Color, Urine AMBER (*)    APPearance HAZY (*)    Hgb urine dipstick MODERATE (*)    Bacteria, UA RARE (*)    All other components within normal limits  SARS CORONAVIRUS 2 (HOSPITAL ORDER, PERFORMED IN [facility])  LACTIC ACID, PLASMA  ETHANOL  CK  MAGNESIUM    EKG EKG Interpretation  Date/Time:  Tuesday December 25 2018 23:57:53 EDT Ventricular Rate:  103 PR Interval:    QRS Duration: 106 QT Interval:  380 QTC Calculation: 498 R Axis:   30 Text Interpretation:  Sinus tachycardia Consider right atrial enlargement LVH with secondary repolarization abnormality Borderline prolonged QT interval When compared with ECG of 07/06/2016, QT has lengthened Left ventricular hypertrophy with strain pattern is now present Confirmed by Delora Fuel ([id number]) on 12/26/2018 12:08:47 AM   Radiology Ct Head Wo Contrast  Result Date: 12/26/2018 CLINICAL DATA:  Altered mental status EXAM: CT HEAD WITHOUT CONTRAST TECHNIQUE: Contiguous axial images were obtained from the base of the skull through the vertex without intravenous contrast. COMPARISON:  02/21/2012 FINDINGS: Brain: No evidence of acute infarction,  hemorrhage, hydrocephalus, extra-axial collection or mass lesion/mass effect. Vascular: No hyperdense vessel or unexpected calcification. Skull: Normal. Negative for fracture or focal lesion. Sinuses/Orbits: No acute finding. Other: None. IMPRESSION: No acute intracranial abnormality noted. Electronically Signed   By: Inez Catalina M.D.   On: 12/26/2018 01:49   Dg Chest Port 1 View  Result Date: 12/26/2018 CLINICAL DATA:  Altered mental status EXAM: PORTABLE CHEST 1 VIEW COMPARISON:  06/27/2016 FINDINGS: Heart is upper limits normal in size. Lungs clear. No effusions or edema. No acute bony abnormality. IMPRESSION: No active disease. Electronically Signed   By: Rolm Baptise M.D.   On: 12/26/2018 00:38    Procedures Procedures  CRITICAL CARE Performed by: Delora Fuel Total critical care time: 45 minutes Critical care time was exclusive of separately billable procedures and treating other patients. Critical care was necessary to treat or prevent imminent or life-threatening deterioration. Critical care was time spent personally by me on the following activities: development of treatment plan with patient and/or surrogate as well as nursing, discussions with consultants, evaluation of patient's response to treatment, examination of patient, obtaining history from patient or surrogate, ordering and performing treatments and interventions, ordering and review of laboratory studies, ordering and review of radiographic studies, pulse oximetry and re-evaluation of patient's condition.  Medications Ordered in ED Medications  levETIRAcetam (KEPPRA) IVPB 1000 mg/100 mL premix (0 mg Intravenous Stopped 12/26/18 0026)     Initial Impression / Assessment and Plan / ED Course  I have reviewed the triage vital signs and the nursing notes.  Pertinent labs & imaging results that were available during my care of the patient were reviewed by me and considered in my medical decision making (see chart for  details).  Probable seizure with current mental state combination of postictal state and secondary to midazolam which had been administered.  Old records are reviewed, and she had a hospital admission in September 2013 for seizure.  EEG at that time was negative and recommendation was to not start on anticonvulsant therapy.  Given that this is apparently her second seizure, she is started on levetiracetam and is given a loading dose intravenously.  She will also be sent for CT of head and screening labs obtained.  ED work-up is unremarkable.  She  has mild hyperglycemia and mild hypokalemia.  Lactate and CK are normal which would argue against recent seizure activity.  ECG shows borderline prolonged QT interval.  Magnesium level is come back normal.  CT of head was unremarkable and chest x-ray was unremarkable.  Drug screen is positive for amphetamines, benzodiazepines, opiates, tetrahydrocannabinol.  However, she had been given midazolam which would account for positive benzodiazepine screen.  She has prescriptions for amphetamines and opiates which would account for those positive tests.  Patient was reevaluated and mental status is essentially unchanged.  COVID-19 screen is negative.  She is still only responsive to deep painful stimuli.  Case is discussed with Dr. Clearence Ped of [facility], who agrees to admit the patient.  Final Clinical Impressions(s) / ED Diagnoses   Final diagnoses:  Altered mental status, unspecified altered mental status type  Seizure-like activity ([place])  Hypokalemia  Elevated glucose level  Prolonged Q-T interval on ECG    ED Discharge Orders    None       Delora Fuel, MD 76/28/31 [phone]

## 2018-12-26 NOTE — Telephone Encounter (Signed)
Sounds like she is acutely ill.  Would cancel procedure; and, yes, she ought to come back to the office for rescheduling.

## 2018-12-26 NOTE — Progress Notes (Signed)
MEDICATION RELATED CONSULT NOTE - INITIAL   Pharmacy Consult for potential drug-drug interactions with antiepileptic medications   Allergies  Allergen Reactions  . Methadone Hives  . Aspirin Other (See Comments)    Stomach Pain    Vital Signs: Temp: 97.6 F (36.4 C) (08/05 0001) Temp Source: Rectal (08/05 0001) BP: 151/87 (08/05 0842) Pulse Rate: 87 (08/05 0848) Intake/Output from previous day: No intake/output data recorded. Intake/Output from this shift: No intake/output data recorded.  Labs: Recent Labs    12/26/18 0014  WBC 15.9*  HGB 15.5*  HCT 47.8*  PLT 291  CREATININE 1.08*  MG 2.2  ALBUMIN 4.0  PROT 6.9  AST 12*  ALT 16  ALKPHOS 80  BILITOT 0.8   CrCl cannot be calculated (Unknown ideal weight.).   Microbiology: Recent Results (from the past 720 hour(s))  SARS CORONAVIRUS 2 Nasal Swab Aptima Multi Swab     Status: None   Collection Time: 12/24/18  7:00 AM   Specimen: Aptima Multi Swab; Nasal Swab  Result Value Ref Range Status   SARS Coronavirus 2 NEGATIVE NEGATIVE Final    Comment: (NOTE) SARS-CoV-2 target nucleic acids are NOT DETECTED. The SARS-CoV-2 RNA is generally detectable in upper and lower respiratory specimens during the acute phase of infection. Negative results do not preclude SARS-CoV-2 infection, do not rule out co-infections with other pathogens, and should not be used as the sole basis for treatment or other patient management decisions. Negative results must be combined with clinical observations, patient history, and epidemiological information. The expected result is Negative. Fact Sheet for Patients: SugarRoll.be Fact Sheet for Healthcare Providers: https://www.woods-mathews.com/ This test is not yet approved or cleared by the [place] FDA and  has been authorized for detection and/or diagnosis of SARS-CoV-2 by FDA under an Emergency Use Authorization (EUA). This EUA will  remain  in effect (meaning this test can be used) for the duration of the COVID-19 declaration under Section 56 4(b)(1) of the Act, 21 U.S.C. section 360bbb-3(b)(1), unless the authorization is terminated or revoked sooner. Performed at [facility], [place] [address], [place], [place] [address]   SARS Coronavirus 2 [facility] order, Performed in [facility] hospital lab) Nasopharyngeal Nasopharyngeal Swab     Status: None   Collection Time: 12/26/18 12:10 AM   Specimen: Nasopharyngeal Swab  Result Value Ref Range Status   SARS Coronavirus 2 NEGATIVE NEGATIVE Final    Comment: (NOTE) If result is NEGATIVE SARS-CoV-2 target nucleic acids are NOT DETECTED. The SARS-CoV-2 RNA is generally detectable in upper and lower  respiratory specimens during the acute phase of infection. The lowest  concentration of SARS-CoV-2 viral copies this assay can detect is 250  copies / mL. A negative result does not preclude SARS-CoV-2 infection  and should not be used as the sole basis for treatment or other  patient management decisions.  A negative result may occur with  improper specimen collection / handling, submission of specimen other  than nasopharyngeal swab, presence of viral mutation(s) within the  areas targeted by this assay, and inadequate number of viral copies  (<250 copies / mL). A negative result must be combined with clinical  observations, patient history, and epidemiological information. If result is POSITIVE SARS-CoV-2 target nucleic acids are DETECTED. The SARS-CoV-2 RNA is generally detectable in upper and lower  respiratory specimens dur ing the acute phase of infection.  Positive  results are indicative of active infection with SARS-CoV-2.  Clinical  correlation with patient history and other  diagnostic information is  necessary to determine patient infection status.  Positive results do  not rule out bacterial infection or co-infection with other viruses. If result is  PRESUMPTIVE POSTIVE SARS-CoV-2 nucleic acids MAY BE PRESENT.   A presumptive positive result was obtained on the submitted specimen  and confirmed on repeat testing.  While 2019 novel coronavirus  (SARS-CoV-2) nucleic acids may be present in the submitted sample  additional confirmatory testing may be necessary for epidemiological  and / or clinical management purposes  to differentiate between  SARS-CoV-2 and other Sarbecovirus currently known to infect humans.  If clinically indicated additional testing with an alternate test  methodology [phone]) is advised. The SARS-CoV-2 RNA is generally  detectable in upper and lower respiratory sp ecimens during the acute  phase of infection. The expected result is Negative. Fact Sheet for Patients:  StrictlyIdeas.no Fact Sheet for Healthcare Providers: BankingDealers.co.za This test is not yet approved or cleared by the [place] FDA and has been authorized for detection and/or diagnosis of SARS-CoV-2 by FDA under an Emergency Use Authorization (EUA).  This EUA will remain in effect (meaning this test can be used) for the duration of the COVID-19 declaration under Section 564(b)(1) of the Act, 21 U.S.C. section 360bbb-3(b)(1), unless the authorization is terminated or revoked sooner. Performed at [facility], [address] [place], [place] [address]     Medical History: Past Medical History:  Diagnosis Date  . Arthritis   . Asthma   . COPD (chronic obstructive pulmonary disease) ([place])   . Depression   . Fibromyalgia   . Frequent urination at night   . GERD (gastroesophageal reflux disease)   . Hypercholesteremia   . IBS (irritable bowel syndrome)   . Tachycardia    treated with Cardizem  . Tendonitis     Assessment/Plan:  Reviewd medication regimen and no drug-drug interactions identified with Keppra. Will continue to monitor patient and her antiepileptic medications.  Isac Sarna, BS Vena [place], BCPS Clinical Pharmacist Pager [phone] 12/26/2018,12:00 PM

## 2018-12-26 NOTE — ED Notes (Signed)
Patient transported to MRI 

## 2018-12-26 NOTE — H&P (Addendum)
TRH H&P    Patient Demographics:    Theresa Pace, is a 58 y.o. female  MRN: [id number]  DOB - April 15, 1961  Admit Date - 12/25/2018  Referring MD/NP/PA: Dr. Roxanne Mins  Outpatient Primary MD for the patient is Caryl Bis, MD  Patient coming from: Home via EMS  Chief complaint- Altered mental status   HPI:    Theresa Pace  is a 58 y.o. female, with history of COPD, depression, fibromyalgia, GERD, hypercholesterolemia, who presents to the ED today via EMS.  EMS was apparently called to the house due to suspected allergic reaction.  Patient then had seizure-like activity in front of them and became combative.  Patient was given 5 of Versed in route to the ED.  After the 5 Versed patient became hypoxic and required oxygen supplementation with a 2 L nasal cannula.  Patient has not returned to baseline mentation in the 2-1/2 hours that she has been in the ER.  Unfortunately she cannot give [place] any history.  EMS reports that they tried to obtain history from husband, and it was ultimately unfruitful.   ED course  Lab work revealed a hypokalemia at 3.3.  And hyperglycemia at 215.  Lactic acid was 1.1, CK was 61.  There is a leukocytosis at 15.9.  Urine was not suspicious for UTI as white blood cells were 0-5.  There was moderate glucose in the urine and hyaline casts.  CT head showed no acute intracranial abnormality.  Chest x-ray showed no active disease.  Tox screen was positive for amphetamines benzos opiates and THC.  Patient apparently is prescribed Adderall, opiates for pain, she was given benzo in the ambulance, so the only unexplained result is the [facility]  Since this could potentially be patient's second seizure she was loaded with Keppra in the ED.  Chart review reveals an admission December 22, 2011 for possible seizure.  At that time she had been found by her husband in the night shaking.  She was monitored in the ED, and  determined to have postictal state as her confusion persisted for many hours, so she was admitted to the hospital for further evaluation.  At that time CT and MRI of brain were unremarkable.  EEG was done which did not show epileptiform changes.  She was seen by neurology and anticonvulsants were not recommended at that point.  Patient felt as though Robaxin may have been the precipitating agent at that point.  Of note patient is scheduled for colonoscopy with Dr. Gala Romney August 6.   Review of systems:    Could not be obtained due to patient's altered mental status.    Past History of the following :    Past Medical History:  Diagnosis Date  . Arthritis   . Asthma   . COPD (chronic obstructive pulmonary disease) ([place])   . Depression   . Fibromyalgia   . Frequent urination at night   . GERD (gastroesophageal reflux disease)   . Hypercholesteremia   . IBS (irritable bowel syndrome)   . Tachycardia  treated with Cardizem  . Tendonitis       Past Surgical History:  Procedure Laterality Date  . ABDOMINAL HYSTERECTOMY    . APPENDECTOMY    . KNEE ARTHROPLASTY Right 05/23/2017      Social History:      Social History   Tobacco Use  . Smoking status: Former Smoker    Packs/day: 0.25    Types: Cigarettes  . Smokeless tobacco: Never Used  Substance Use Topics  . Alcohol use: No       Family History :     Family History  Problem Relation Age of Onset  . Colon cancer Neg Hx       Home Medications:   Prior to Admission medications   Medication Sig Start Date End Date Taking? Authorizing Provider  albuterol (PROVENTIL HFA;VENTOLIN HFA) 108 (90 BASE) MCG/ACT inhaler Inhale 2 puffs into the lungs every 6 (six) hours as needed for wheezing or shortness of breath.     [provider]  amphetamine-dextroamphetamine (ADDERALL XR) 30 MG 24 hr capsule Take 90 mg by mouth every morning.    [provider]  aspirin EC 81 MG tablet Take 81 mg by mouth daily.     [provider]  budesonide (PULMICORT) 0.5 MG/2ML nebulizer solution Take 2 mLs (0.5 mg total) by nebulization daily. Patient taking differently: Take 0.5 mg by nebulization 4 (four) times daily as needed (for shortness of breath or wheezing).  06/27/16   Isla Pence, MD  dicyclomine (BENTYL) 10 MG capsule Take 1 capsule (10 mg total) by mouth 4 (four) times daily as needed for spasms (or diarrhea). 10/25/18   Carlis Stable, NP  diltiazem (DILACOR XR) 120 MG 24 hr capsule Take 120 mg by mouth daily.    [provider]  ipratropium-albuterol (DUONEB) 0.5-2.5 (3) MG/3ML SOLN Take 3 mLs by nebulization 4 (four) times daily. Patient taking differently: Take 3 mLs by nebulization 3 (three) times daily.  02/20/15   Samuella Cota, MD  loperamide (IMODIUM A-D) 2 MG tablet Take 8 mg by mouth daily as needed for diarrhea or loose stools.    [provider]  oxyCODONE-acetaminophen (PERCOCET/ROXICET) 5-325 MG tablet Take 1 tablet by mouth every 4 (four) hours as needed. 07/19/18   Triplett, Tammy, PA-C  pantoprazole (PROTONIX) 40 MG tablet Take 1 tablet (40 mg total) by mouth 2 (two) times daily before a meal. 07/11/16   Orvan Falconer, MD  risperiDONE (RISPERDAL) 0.5 MG tablet Take 0.5 mg by mouth 2 (two) times daily.    [provider]  rosuvastatin (CRESTOR) 20 MG tablet Take 20 mg by mouth daily.    [provider]  traZODone (DESYREL) 50 MG tablet Take 150 mg by mouth at bedtime.     [provider]     Allergies:     Allergies  Allergen Reactions  . Methadone Hives  . Aspirin Other (See Comments)    Stomach Pain     Physical Exam:   Vitals  Blood pressure 97/63, pulse 83, temperature 97.6 F (36.4 C), temperature source Rectal, resp. rate 16, SpO2 95 %.  1.  General: Patient lying supine in bed, unconscious, snoring  2. Psychiatric: Cannot be evaluated due to patient's altered mental status  3. Neurologic: Reflexes are symmetric  in the Achilles and patellar tendons.  4. HEENMT:  Normocephalic atraumatic.  Patient is edentulous.  Pupils are equal and reactive to light.  Neck is supple short and obese.  5. Respiratory :  Wheezing present in the lower lung fields bilaterally.  More on the right than the left.  No crackles.  No increased work of breathing.  Patient is on 2 L nasal cannula.  Satting above 90.  6. Cardiovascular : H RRR no murmurs rubs or gallops  7. Gastrointestinal:  Soft nondistended obese no organomegaly  8. Skin:  Bruising on the inside of the right ankle no other skin changes noted on exam      Data Review:    CBC Recent Labs  Lab 12/26/18 0014  WBC 15.9*  HGB 15.5*  HCT 47.8*  PLT 291  MCV 92.3  MCH 29.9  MCHC 32.4  RDW 14.1  LYMPHSABS 2.1  MONOABS 0.8  EOSABS 0.1  BASOSABS 0.1   ------------------------------------------------------------------------------------------------------------------  Results for orders placed or performed during the hospital encounter of 12/25/18 (from the past 48 hour(s))  Urine rapid drug screen (hosp performed)     Status: Abnormal   Collection Time: 12/26/18 12:01 AM  Result Value Ref Range   Opiates POSITIVE (A) NONE DETECTED   Cocaine NONE DETECTED NONE DETECTED   Benzodiazepines POSITIVE (A) NONE DETECTED   Amphetamines POSITIVE (A) NONE DETECTED   Tetrahydrocannabinol POSITIVE (A) NONE DETECTED   Barbiturates NONE DETECTED NONE DETECTED    Comment: (NOTE) DRUG SCREEN FOR MEDICAL PURPOSES ONLY.  IF CONFIRMATION IS NEEDED FOR ANY PURPOSE, NOTIFY LAB WITHIN 5 DAYS. LOWEST DETECTABLE LIMITS FOR URINE DRUG SCREEN Drug Class                     Cutoff (ng/mL) Amphetamine and metabolites    1000 Barbiturate and metabolites    200 Benzodiazepine                 742 Tricyclics and metabolites     300 Opiates and metabolites        300 Cocaine and metabolites        300 THC                            50 Performed at [facility], [address], [place], [place] [address]   Urinalysis, Routine w reflex microscopic     Status: Abnormal   Collection Time: 12/26/18 12:01 AM  Result Value Ref Range   Color, Urine AMBER (A) YELLOW    Comment: BIOCHEMICALS MAY BE AFFECTED BY COLOR   APPearance HAZY (A) CLEAR   Specific Gravity, Urine 1.024 1.005 - 1.030   pH 5.0 5.0 - 8.0   Glucose, UA NEGATIVE NEGATIVE mg/dL   Hgb urine dipstick MODERATE (A) NEGATIVE   Bilirubin Urine NEGATIVE NEGATIVE   Ketones, ur NEGATIVE NEGATIVE mg/dL   Protein, ur NEGATIVE NEGATIVE mg/dL   Nitrite NEGATIVE NEGATIVE   Leukocytes,Ua NEGATIVE NEGATIVE   RBC / HPF 0-5 0 - 5 RBC/hpf   WBC, UA 0-5 0 - 5 WBC/hpf   Bacteria, UA RARE (A) NONE SEEN   Squamous Epithelial / LPF 0-5 0 - 5   Mucus PRESENT    Hyaline Casts, UA PRESENT     Comment: Performed at [facility], [address] [place], [place] [address]  SARS Coronavirus 2 [facility] order, Performed in [facility] hospital lab) Nasopharyngeal Nasopharyngeal Swab     Status: None   Collection Time: 12/26/18 12:10 AM   Specimen: Nasopharyngeal Swab  Result Value Ref Range   SARS Coronavirus 2 NEGATIVE NEGATIVE    Comment: (NOTE) If result  is NEGATIVE SARS-CoV-2 target nucleic acids are NOT DETECTED. The SARS-CoV-2 RNA is generally detectable in upper and lower  respiratory specimens during the acute phase of infection. The lowest  concentration of SARS-CoV-2 viral copies this assay can detect is 250  copies / mL. A negative result does not preclude SARS-CoV-2 infection  and should not be used as the sole basis for treatment or other  patient management decisions.  A negative result may occur with  improper specimen collection / handling, submission of specimen other  than nasopharyngeal swab, presence of viral mutation(s) within the  areas targeted by this assay, and inadequate number of viral copies  (<250 copies / mL). A negative result must be combined with clinical  observations,  patient history, and epidemiological information. If result is POSITIVE SARS-CoV-2 target nucleic acids are DETECTED. The SARS-CoV-2 RNA is generally detectable in upper and lower  respiratory specimens dur ing the acute phase of infection.  Positive  results are indicative of active infection with SARS-CoV-2.  Clinical  correlation with patient history and other diagnostic information is  necessary to determine patient infection status.  Positive results do  not rule out bacterial infection or co-infection with other viruses. If result is PRESUMPTIVE POSTIVE SARS-CoV-2 nucleic acids MAY BE PRESENT.   A presumptive positive result was obtained on the submitted specimen  and confirmed on repeat testing.  While 2019 novel coronavirus  (SARS-CoV-2) nucleic acids may be present in the submitted sample  additional confirmatory testing may be necessary for epidemiological  and / or clinical management purposes  to differentiate between  SARS-CoV-2 and other Sarbecovirus currently known to infect humans.  If clinically indicated additional testing with an alternate test  methodology [phone]) is advised. The SARS-CoV-2 RNA is generally  detectable in upper and lower respiratory sp ecimens during the acute  phase of infection. The expected result is Negative. Fact Sheet for Patients:  StrictlyIdeas.no Fact Sheet for Healthcare Providers: BankingDealers.co.za This test is not yet approved or cleared by the [place] FDA and has been authorized for detection and/or diagnosis of SARS-CoV-2 by FDA under an Emergency Use Authorization (EUA).  This EUA will remain in effect (meaning this test can be used) for the duration of the COVID-19 declaration under Section 564(b)(1) of the Act, 21 U.S.C. section 360bbb-3(b)(1), unless the authorization is terminated or revoked sooner. Performed at [facility], [address] [place],  [address]    Comprehensive metabolic panel     Status: Abnormal   Collection Time: 12/26/18 12:14 AM  Result Value Ref Range   Sodium 138 135 - 145 mmol/L   Potassium 3.3 (L) 3.5 - 5.1 mmol/L   Chloride 105 98 - 111 mmol/L   CO2 21 (L) 22 - 32 mmol/L   Glucose, Bld 215 (H) 70 - 99 mg/dL   BUN 16 6 - 20 mg/dL   Creatinine, Ser 1.08 (H) 0.44 - 1.00 mg/dL   Calcium 8.9 8.9 - 10.3 mg/dL   Total Protein 6.9 6.5 - 8.1 g/dL   Albumin 4.0 3.5 - 5.0 g/dL   AST 12 (L) 15 - 41 U/L   ALT 16 0 - 44 U/L   Alkaline Phosphatase 80 38 - 126 U/L   Total Bilirubin 0.8 0.3 - 1.2 mg/dL   GFR calc non Af Amer 57 (L) >60 mL/min   GFR calc Af Amer >60 >60 mL/min   Anion gap 12 5 - 15    Comment: Performed at [facility], [place]  [address], [place], [place] [address]  Lactic acid, plasma     Status: None   Collection Time: 12/26/18 12:14 AM  Result Value Ref Range   Lactic Acid, Venous 1.1 0.5 - 1.9 mmol/L    Comment: Performed at [facility], [address], [place], [place] [address]  Ethanol     Status: None   Collection Time: 12/26/18 12:14 AM  Result Value Ref Range   Alcohol, Ethyl (B) <10 <10 mg/dL    Comment: (NOTE) Lowest detectable limit for serum alcohol is 10 mg/dL. For medical purposes only. Performed at [facility], [address], [place], [place] [address]   CBC with Differential     Status: Abnormal   Collection Time: 12/26/18 12:14 AM  Result Value Ref Range   WBC 15.9 (H) 4.0 - 10.5 K/uL   RBC 5.18 (H) 3.87 - 5.11 MIL/uL   Hemoglobin 15.5 (H) 12.0 - 15.0 g/dL   HCT 47.8 (H) 36.0 - 46.0 %   MCV 92.3 80.0 - 100.0 fL   MCH 29.9 26.0 - 34.0 pg   MCHC 32.4 30.0 - 36.0 g/dL   RDW 14.1 11.5 - 15.5 %   Platelets 291 150 - 400 K/uL   nRBC 0.0 0.0 - 0.2 %   Neutrophils Relative % 80 %   Neutro Abs 12.6 (H) 1.7 - 7.7 K/uL   Lymphocytes Relative 14 %   Lymphs Abs 2.1 0.7 - 4.0 K/uL   Monocytes Relative 5 %   Monocytes Absolute 0.8 0.1 - 1.0 K/uL   Eosinophils Relative 0 %   Eosinophils Absolute  0.1 0.0 - 0.5 K/uL   Basophils Relative 0 %   Basophils Absolute 0.1 0.0 - 0.1 K/uL   Immature Granulocytes 1 %   Abs Immature Granulocytes 0.11 (H) 0.00 - 0.07 K/uL    Comment: Performed at [facility], [address] [place], [place] [address]  CK     Status: None   Collection Time: 12/26/18 12:14 AM  Result Value Ref Range   Total CK 61 38 - 234 U/L    Comment: Performed at [facility], [address], [place], [place] [address]  Magnesium     Status: None   Collection Time: 12/26/18 12:14 AM  Result Value Ref Range   Magnesium 2.2 1.7 - 2.4 mg/dL    Comment: Performed at [facility], [address] [place], [place] [address]    Chemistries  Recent Labs  Lab 12/26/18 0014  NA 138  K 3.3*  CL 105  CO2 21*  GLUCOSE 215*  BUN 16  CREATININE 1.08*  CALCIUM 8.9  MG 2.2  AST 12*  ALT 16  ALKPHOS 80  BILITOT 0.8   ------------------------------------------------------------------------------------------------------------------  ------------------------------------------------------------------------------------------------------------------ GFR: CrCl cannot be calculated (Unknown ideal weight.). Liver Function Tests: Recent Labs  Lab 12/26/18 0014  AST 12*  ALT 16  ALKPHOS 80  BILITOT 0.8  PROT 6.9  ALBUMIN 4.0   No results for input(s): LIPASE, AMYLASE in the last 168 hours. No results for input(s): AMMONIA in the last 168 hours. Coagulation Profile: No results for input(s): INR, PROTIME in the last 168 hours. Cardiac Enzymes: Recent Labs  Lab 12/26/18 0014  CKTOTAL 61   BNP (last 3 results) No results for input(s): PROBNP in the last 8760 hours. HbA1C: No results for input(s): HGBA1C in the last 72 hours. CBG: No results for input(s): GLUCAP in the last 168 hours. Lipid Profile: No results for input(s): CHOL, HDL, LDLCALC, TRIG, CHOLHDL, LDLDIRECT in  the last 72 hours. Thyroid Function Tests: No results for input(s): TSH, T4TOTAL, FREET4, T3FREE,  THYROIDAB in the last 72 hours. Anemia Panel: No results for input(s): VITAMINB12, FOLATE, FERRITIN, TIBC, IRON, RETICCTPCT in the last 72 hours.  --------------------------------------------------------------------------------------------------------------- Urine analysis:    Component Value Date/Time   COLORURINE AMBER (A) 12/26/2018 0001   APPEARANCEUR HAZY (A) 12/26/2018 0001   LABSPEC 1.024 12/26/2018 0001   PHURINE 5.0 12/26/2018 0001   GLUCOSEU NEGATIVE 12/26/2018 0001   HGBUR MODERATE (A) 12/26/2018 0001   BILIRUBINUR NEGATIVE 12/26/2018 0001   KETONESUR NEGATIVE 12/26/2018 0001   PROTEINUR NEGATIVE 12/26/2018 0001   UROBILINOGEN 0.2 02/21/2012 0055   NITRITE NEGATIVE 12/26/2018 0001   LEUKOCYTESUR NEGATIVE 12/26/2018 0001      Imaging Results:    Ct Head Wo Contrast  Result Date: 12/26/2018 CLINICAL DATA:  Altered mental status EXAM: CT HEAD WITHOUT CONTRAST TECHNIQUE: Contiguous axial images were obtained from the base of the skull through the vertex without intravenous contrast. COMPARISON:  02/21/2012 FINDINGS: Brain: No evidence of acute infarction, hemorrhage, hydrocephalus, extra-axial collection or mass lesion/mass effect. Vascular: No hyperdense vessel or unexpected calcification. Skull: Normal. Negative for fracture or focal lesion. Sinuses/Orbits: No acute finding. Other: None. IMPRESSION: No acute intracranial abnormality noted. Electronically Signed   By: Inez Catalina M.D.   On: 12/26/2018 01:49   Dg Chest Port 1 View  Result Date: 12/26/2018 CLINICAL DATA:  Altered mental status EXAM: PORTABLE CHEST 1 VIEW COMPARISON:  06/27/2016 FINDINGS: Heart is upper limits normal in size. Lungs clear. No effusions or edema. No acute bony abnormality. IMPRESSION: No active disease. Electronically Signed   By: Rolm Baptise M.D.   On: 12/26/2018 00:38    My personal review of EKG: Rhythm NSR, Rate 103 /min, QTc 498,no Acute ST changes   Assessment & Plan:    Active  Problems:   * No active hospital problems. *   1. Acute encephalopathy 1. CT scan of head has ruled out tumor or bleed. 2. If patient does not improve, may consider MRI to rule out stroke although would be unlikely with this presentation. 3. Withdrawal is not likely the etiology as patient has amphetamines opioids benzos and THC in her system. 4. There is no evidence of trauma. 5. Infectious etiology cannot be completely ruled out due to the leukocytosis.  Urine shows no evidence of infection.  Chest x-ray is negative for infiltrate.  No cellulitis identified on skin exam.  COVID test is negative.  Continue to monitor leukocytosis.  Could be acute phase reactant post seizure 6. Will order ammonia level.  Other acute metabolic disorder unlikely as electrolytes are within normal limits, BUN/creatinine was within normal limits, hypoglycemia ruled out with a glucose of 215.  Hyperglycemia is not severe enough to cause this presentation.  Will get a TSH to rule out thyroid derangement. 7. We will get an ABG to rule out oxygenation/ventilation etiology 8. Will obtain X83 folic acid and thiamine to evaluate for deficiencies 9. It is possible that patient has taken either amphetamine benzo or opiate in a manner other than what is prescribed.  Narcan was not given due to the fact the patient does not have pinpoint pupils, and the altered mental status only happened after EMS arrived.  We will reduce some of these medications while she is in the hospital. 2. Seizure-like activity 1. Could potentially be patient's second seizure, as she has been admitted for seizure-like activity before.  CT head negative.  Hypo-/hyper glycemia  not likely the cause.  Magnesium is within normal limits.  BUN is within normal limits.  Will evaluate TSH.  Patient is not likely in withdrawal due to all of the substances in her system.   2. Patient loaded with Keppra in the ED we will continue Keppra. 3. May consider neuro consult.  3. Hyperglycemia 1. Patient does not have a diagnosed history of diabetes mellitus, however any reading over 200 can be diagnostic.  We will order hemoglobin A1c.  We will add sliding scale coverage. 4. Glycosuria 1. See plan as above. 5. Leukocytosis 1. Likely acute phase reaction and not infectious.  Continue to monitor. 6. Hypokalemia 1. Potassium was 3.3.  We will replete with 20 mEq of potassium. 7. Polypharmacy 1. Could be contributing to #1 and #2.  We will hold off on some of her sedating medications.  Acute hypoxic respiratory failure -Likely secondary to sedation, OSA -Continue oxygen supplementation -Not likely infectious as chest x-ray is negative -If no improvement consider CT scan    DVT Prophylaxis-   Lovenox - SCDs  AM Labs Ordered, also please review Full Orders  Family Communication: Admission, patients condition and plan of care including tests being ordered have not been discussed been discussed with the patient due to altered mental status   Code Status: Full  Admission status: Observation: Based on patients clinical presentation and evaluation of above clinical data, I have made determination that patient meets observation criteria at this time.  Time spent in minutes : 60   Rolla Plate M.D on 12/26/2018 at 2:41 AM

## 2018-12-26 NOTE — Progress Notes (Signed)
Patient seen and examined.  Admitted after midnight secondary to acute encephalopathy and obtunded mentation.  CT scan and MRI negative for acute abnormalities.  Concerns for nonintentional overdose (patient chronically on opiates, benzodiazepines and amphetamines; UDS was also positive for marijuana).  There was some concerns of seizure-like activity appreciated by EMS personnel and the patient has been started on antiepileptic drugs prophylactically.  Neurology has been consulted and will follow recommendations.  At this time follow clinical response and minimize sedative medication while allowing her body to get rid of any extra drugs.  Please refer to H&P written by Dr. Clearence Ped for further info/details on admission.  Suzette Battiest MD [phone]

## 2018-12-26 NOTE — Progress Notes (Signed)
EEG complete - results pending 

## 2018-12-26 NOTE — ED Triage Notes (Signed)
Ems was originally called out for allergic reaction when they arrived pt was talking and then had seizure like activity and was combative. Pt was given 5mg  of versed by ems.

## 2018-12-26 NOTE — Telephone Encounter (Signed)
I received a message from pam in endo patient was currently in the ER for altered mental status and seizure like activity. Patient on for procedure tomorrow.  Called and spoke with spouse. He feels she is being kept and we should go ahead and cancel procedure. I advised once patient is discharged to give [place] a call. Called endo and procedure cancelled.  FYI to EG. Also will patient need an OV to reschedule Dr. Gala Romney? Thanks

## 2018-12-26 NOTE — Procedures (Signed)
Patient Name: Theresa Pace  MRN: [id number]  Epilepsy Attending: Lora Havens  Referring Physician/Provider: Dr Barton Dubois Date: 12/26/18 Duration: 25.11 mins  Patient history: 58yo F with seizure like episode. EEG to evaluate for seizure  Level of alertness: awake, lethargic  Technical aspects: This EEG study was done with scalp electrodes positioned according to the 10-20 International system of electrode placement. Electrical activity was acquired at a sampling rate of 500Hz  and reviewed with a high frequency filter of 70Hz  and a low frequency filter of 1Hz . EEG data were recorded continuously and digitally stored.   DESCRIPTION: EEG showed 1.5-2hz  generalized triphasic waves, maximal bifrontal. These triphasic waves appeared rhythmic at time with no clear evolution to suggest seizure. Continuous generalized 2-5hz  theta-delta slowing was also noted.  Hyperventilation and photic stimulation were not performed.  IMPRESSION: This study is suggestive of moderate diffuse encephalopathy. No seizures or clear epileptiform discharges were seen throughout the recording.

## 2018-12-27 ENCOUNTER — Encounter (HOSPITAL_COMMUNITY): Admission: RE | Payer: Self-pay | Source: Home / Self Care

## 2018-12-27 ENCOUNTER — Observation Stay (HOSPITAL_COMMUNITY): Payer: Medicaid Other

## 2018-12-27 ENCOUNTER — Inpatient Hospital Stay (HOSPITAL_COMMUNITY): Payer: Medicaid Other

## 2018-12-27 ENCOUNTER — Ambulatory Visit (HOSPITAL_COMMUNITY): Admission: RE | Admit: 2018-12-27 | Payer: Medicaid Other | Source: Home / Self Care | Admitting: Internal Medicine

## 2018-12-27 DIAGNOSIS — R7303 Prediabetes: Secondary | ICD-10-CM | POA: Diagnosis present

## 2018-12-27 DIAGNOSIS — E785 Hyperlipidemia, unspecified: Secondary | ICD-10-CM | POA: Diagnosis present

## 2018-12-27 DIAGNOSIS — I10 Essential (primary) hypertension: Secondary | ICD-10-CM | POA: Diagnosis present

## 2018-12-27 DIAGNOSIS — G9341 Metabolic encephalopathy: Secondary | ICD-10-CM | POA: Diagnosis present

## 2018-12-27 DIAGNOSIS — J449 Chronic obstructive pulmonary disease, unspecified: Secondary | ICD-10-CM | POA: Diagnosis present

## 2018-12-27 DIAGNOSIS — G4733 Obstructive sleep apnea (adult) (pediatric): Secondary | ICD-10-CM | POA: Diagnosis present

## 2018-12-27 DIAGNOSIS — R739 Hyperglycemia, unspecified: Secondary | ICD-10-CM | POA: Diagnosis present

## 2018-12-27 DIAGNOSIS — R4702 Dysphasia: Secondary | ICD-10-CM | POA: Diagnosis present

## 2018-12-27 DIAGNOSIS — J9601 Acute respiratory failure with hypoxia: Secondary | ICD-10-CM | POA: Diagnosis present

## 2018-12-27 DIAGNOSIS — K219 Gastro-esophageal reflux disease without esophagitis: Secondary | ICD-10-CM | POA: Diagnosis present

## 2018-12-27 DIAGNOSIS — W19XXXD Unspecified fall, subsequent encounter: Secondary | ICD-10-CM | POA: Diagnosis not present

## 2018-12-27 DIAGNOSIS — F339 Major depressive disorder, recurrent, unspecified: Secondary | ICD-10-CM | POA: Diagnosis not present

## 2018-12-27 DIAGNOSIS — E876 Hypokalemia: Secondary | ICD-10-CM | POA: Diagnosis present

## 2018-12-27 DIAGNOSIS — Z96651 Presence of right artificial knee joint: Secondary | ICD-10-CM | POA: Diagnosis present

## 2018-12-27 DIAGNOSIS — E663 Overweight: Secondary | ICD-10-CM | POA: Diagnosis present

## 2018-12-27 DIAGNOSIS — D72829 Elevated white blood cell count, unspecified: Secondary | ICD-10-CM | POA: Diagnosis present

## 2018-12-27 DIAGNOSIS — R4182 Altered mental status, unspecified: Secondary | ICD-10-CM | POA: Diagnosis present

## 2018-12-27 DIAGNOSIS — R9431 Abnormal electrocardiogram [ECG] [EKG]: Secondary | ICD-10-CM | POA: Diagnosis present

## 2018-12-27 DIAGNOSIS — R7309 Other abnormal glucose: Secondary | ICD-10-CM | POA: Diagnosis not present

## 2018-12-27 DIAGNOSIS — F323 Major depressive disorder, single episode, severe with psychotic features: Secondary | ICD-10-CM | POA: Diagnosis present

## 2018-12-27 DIAGNOSIS — S8254XA Nondisplaced fracture of medial malleolus of right tibia, initial encounter for closed fracture: Secondary | ICD-10-CM | POA: Diagnosis not present

## 2018-12-27 DIAGNOSIS — K589 Irritable bowel syndrome without diarrhea: Secondary | ICD-10-CM | POA: Diagnosis present

## 2018-12-27 DIAGNOSIS — G40409 Other generalized epilepsy and epileptic syndromes, not intractable, without status epilepticus: Secondary | ICD-10-CM | POA: Diagnosis present

## 2018-12-27 DIAGNOSIS — E7800 Pure hypercholesterolemia, unspecified: Secondary | ICD-10-CM | POA: Diagnosis present

## 2018-12-27 DIAGNOSIS — M797 Fibromyalgia: Secondary | ICD-10-CM | POA: Diagnosis present

## 2018-12-27 DIAGNOSIS — Z90710 Acquired absence of both cervix and uterus: Secondary | ICD-10-CM | POA: Diagnosis not present

## 2018-12-27 DIAGNOSIS — Z20828 Contact with and (suspected) exposure to other viral communicable diseases: Secondary | ICD-10-CM | POA: Diagnosis present

## 2018-12-27 DIAGNOSIS — G894 Chronic pain syndrome: Secondary | ICD-10-CM | POA: Diagnosis present

## 2018-12-27 DIAGNOSIS — S8251XA Displaced fracture of medial malleolus of right tibia, initial encounter for closed fracture: Secondary | ICD-10-CM | POA: Diagnosis present

## 2018-12-27 DIAGNOSIS — S82891A Other fracture of right lower leg, initial encounter for closed fracture: Secondary | ICD-10-CM

## 2018-12-27 LAB — CBC
HCT: 46.6 % — ABNORMAL HIGH (ref 36.0–46.0)
Hemoglobin: 14.9 g/dL (ref 12.0–15.0)
MCH: 30.1 pg (ref 26.0–34.0)
MCHC: 32 g/dL (ref 30.0–36.0)
MCV: 94.1 fL (ref 80.0–100.0)
Platelets: 306 10*3/uL (ref 150–400)
RBC: 4.95 MIL/uL (ref 3.87–5.11)
RDW: 14.4 % (ref 11.5–15.5)
WBC: 12.5 10*3/uL — ABNORMAL HIGH (ref 4.0–10.5)
nRBC: 0 % (ref 0.0–0.2)

## 2018-12-27 LAB — FOLATE RBC
Folate, Hemolysate: 284 ng/mL
Folate, RBC: 638 ng/mL (ref >498)
Hematocrit: 44.5 % (ref 34.0–46.6)

## 2018-12-27 LAB — BASIC METABOLIC PANEL
Anion gap: 6 (ref 5–15)
BUN: 11 mg/dL (ref 6–20)
CO2: 27 mmol/L (ref 22–32)
Calcium: 8.8 mg/dL — ABNORMAL LOW (ref 8.9–10.3)
Chloride: 108 mmol/L (ref 98–111)
Creatinine, Ser: 0.83 mg/dL (ref 0.44–1.00)
GFR calc Af Amer: >60 mL/min (ref >60)
GFR calc non Af Amer: >60 mL/min (ref >60)
Glucose, Bld: 110 mg/dL — ABNORMAL HIGH (ref 70–99)
Potassium: 4.1 mmol/L (ref 3.5–5.1)
Sodium: 141 mmol/L (ref 135–145)

## 2018-12-27 LAB — HIV ANTIBODY (ROUTINE TESTING W REFLEX): HIV Screen 4th Generation wRfx: NONREACTIVE

## 2018-12-27 LAB — GLUCOSE, CAPILLARY
Glucose-Capillary: 108 mg/dL — ABNORMAL HIGH (ref 70–99)
Glucose-Capillary: 80 mg/dL (ref 70–99)
Glucose-Capillary: 158 mg/dL — ABNORMAL HIGH (ref 70–99)
Glucose-Capillary: 97 mg/dL (ref 70–99)

## 2018-12-27 SURGERY — COLONOSCOPY WITH PROPOFOL
Anesthesia: Monitor Anesthesia Care

## 2018-12-27 MED ORDER — HYDROCODONE-ACETAMINOPHEN 5-325 MG PO TABS
1.0000 | ORAL_TABLET | Freq: Three times a day (TID) | ORAL | Status: DC | PRN
  Administered 2018-12-27: 2 via ORAL
  Administered 2018-12-27: 1 via ORAL
  Administered 2018-12-28 – 2018-12-29 (×4): 2 via ORAL
  Filled 2018-12-27 (×2): qty 2
  Filled 2018-12-27 (×2): qty 1
  Filled 2018-12-27 (×2): qty 2
  Filled 2018-12-27: qty 1

## 2018-12-27 MED ORDER — DILTIAZEM HCL ER COATED BEADS 120 MG PO CP24
120.0000 mg | ORAL_CAPSULE | Freq: Every day | ORAL | Status: DC
  Administered 2018-12-28 – 2018-12-29 (×2): 120 mg via ORAL
  Filled 2018-12-27 (×2): qty 1

## 2018-12-27 MED ORDER — LEVETIRACETAM 500 MG PO TABS
500.0000 mg | ORAL_TABLET | Freq: Two times a day (BID) | ORAL | Status: DC
  Administered 2018-12-27 – 2018-12-29 (×4): 500 mg via ORAL
  Filled 2018-12-27 (×4): qty 1

## 2018-12-27 MED ORDER — ALBUTEROL SULFATE (2.5 MG/3ML) 0.083% IN NEBU: 2.5000 mg | INHALATION_SOLUTION | Freq: Four times a day (QID) | RESPIRATORY_TRACT | Status: DC | PRN

## 2018-12-27 NOTE — Evaluation (Signed)
Clinical/Bedside Swallow Evaluation Patient Details  Name: Theresa Pace MRN: [id number] Date of Birth: [birth year]  Today's Date: 12/27/2018 Time: SLP Start Time (ACUTE ONLY): 1330 SLP Stop Time (ACUTE ONLY): 1356 SLP Time Calculation (min) (ACUTE ONLY): 26 min  Past Medical History:  Past Medical History:  Diagnosis Date  . Arthritis   . Asthma   . COPD (chronic obstructive pulmonary disease) ([place])   . Depression   . Fibromyalgia   . Frequent urination at night   . GERD (gastroesophageal reflux disease)   . Hypercholesteremia   . IBS (irritable bowel syndrome)   . Tachycardia    treated with Cardizem  . Tendonitis    Past Surgical History:  Past Surgical History:  Procedure Laterality Date  . ABDOMINAL HYSTERECTOMY    . APPENDECTOMY    . KNEE ARTHROPLASTY Right 05/23/2017   HPI:  Admitted after midnight secondary to acute encephalopathy and obtunded mentation.  CT scan and MRI negative for acute abnormalities.  Concerns for nonintentional overdose (patient chronically on opiates, benzodiazepines and amphetamines; UDS was also positive for marijuana).  There was some concerns of seizure-like activity appreciated by EMS personnel and the patient has been started on antiepileptic drugs prophylactically.  Neurology has been consulted and will follow recommendations. BSE requested.   Assessment / Plan / Recommendation Clinical Impression  Clinical swallow evaluation completed at bedside with husband present. The Pt and spouse report several year history of getting choked on air, saliva, foods, and liquids. Pt is a smoker and has COPD, she currently has O2 via nasal canula, but does not wear at home. No recent documentation of PNA. Oral motor examination is WNL. Pt has U/L dentures, but does have here. She frequently eats without her dentures. Pt shows no overt signs or symptoms of aspiration during today's assessment while Pt self feeding. Suspect that Pt may experience episodic  penetration/aspiration events in setting of COPD. SLP reviewed strategies for taking small bites/sips, avoiding talking during meals, sitting upright during and after meals, and taking her time. Recommend regular textures with use of strategies and thin liquids. No further SLP services indicated at this time. Chest xray without acute changes. Pt reports that she is following with Dr. Gala Romney for colonoscopy and is considering EGD. Can complete outpatient MBSS if desired. Pt/spouse in agreement with plan of care.    SLP Visit Diagnosis: Dysphagia, unspecified (R13.10)    Aspiration Risk  Mild aspiration risk    Diet Recommendation Regular;Thin liquid   Liquid Administration via: Cup;Straw Medication Administration: Whole meds with liquid Supervision: Patient able to self feed Compensations: Small sips/bites Postural Changes: Seated upright at 90 degrees;Remain upright for at least 30 minutes after po intake    Other  Recommendations Oral Care Recommendations: Oral care BID Other Recommendations: Clarify dietary restrictions   Follow up Recommendations None(can consider outpatient MBSS if Pt desires down the road)      Frequency and Duration            Prognosis Prognosis for Safe Diet Advancement: Good      Swallow Study   General Date of Onset: 12/25/18 HPI: Admitted after midnight secondary to acute encephalopathy and obtunded mentation.  CT scan and MRI negative for acute abnormalities.  Concerns for nonintentional overdose (patient chronically on opiates, benzodiazepines and amphetamines; UDS was also positive for marijuana).  There was some concerns of seizure-like activity appreciated by EMS personnel and the patient has been started on antiepileptic drugs prophylactically.  Neurology has been consulted and  will follow recommendations. BSE requested. Type of Study: Bedside Swallow Evaluation Previous Swallow Assessment: None on record Diet Prior to this Study: Regular;Thin  liquids Temperature Spikes Noted: No Respiratory Status: Nasal cannula History of Recent Intubation: No Behavior/Cognition: Alert;Cooperative;Pleasant mood Oral Cavity Assessment: Within Functional Limits Oral Care Completed by SLP: No Oral Cavity - Dentition: Edentulous(has dentures, not present) Vision: Functional for self-feeding Self-Feeding Abilities: Able to feed self Patient Positioning: Upright in bed Baseline Vocal Quality: Normal Volitional Cough: Strong Volitional Swallow: Able to elicit    Oral/Motor/Sensory Function Overall Oral Motor/Sensory Function: Within functional limits   Ice Chips Ice chips: Within functional limits Presentation: Spoon   Thin Liquid Thin Liquid: Within functional limits Presentation: Cup;Straw;Self Fed    Nectar Thick Nectar Thick Liquid: Not tested   Honey Thick Honey Thick Liquid: Not tested   Puree Puree: Within functional limits Presentation: Spoon   Solid     Solid: Within functional limits Presentation: Self Fed     Thank you,  Genene Churn, [place]   Royelle Hinchman 12/27/2018,2:03 PM

## 2018-12-27 NOTE — Progress Notes (Signed)
PROGRESS NOTE    Theresa Pace  LOV:[id number] DOB: [birth year] DOA: 12/25/2018 PCP: Caryl Bis, MD     Brief Narrative:  58 y.o. female, with history of COPD, depression, fibromyalgia, GERD, hypercholesterolemia, who presents to the ED today via EMS.  EMS was apparently called to the house due to suspected allergic reaction.  Patient then had seizure-like activity in front of them and became combative.  Patient was given 5 of Versed in route to the ED.  After the 5 Versed patient became hypoxic and required oxygen supplementation with a 2 L nasal cannula.  Patient has not returned to baseline mentation in the 2-1/2 hours that she has been in the ER.  Unfortunately she cannot give [place] any history.  EMS reports that they tried to obtain history from husband, and it was ultimately unfruitful.    Assessment & Plan: 1-acute metabolic encephalopathy -Negative CT and MRI -EEG no revealing of epileptic waveforms activity -Patient was kept on [place] -Neurology consult pending -She has awaken since the time of admission, oriented x2 and continuously improving -No focal deficits appreciated unable to follow commands appropriately -Continue minimizing medications that can alter mentation. -Will relied on neurology recommendations for the need of antiepileptic drugs long-term. For now continue keppra, but transition to PO. -TSH and B12 within normal limits.  2-dysphasia -On presentation due to altered mentation -Evaluated by speech therapy and recommendations given for regular diet with thin liquids.  3-prediabetes -A1c repeated at 6.1 -Continue modified carbohydrate diet and close outpatient follow-up.  4-hyperlipidemia -Continue Crestor  5-hypertension -Prior to admission using Cardizem on daily basis -Patient was started on metoprolol while unable to take by mouth -Will transition back to home regimen and stop IV beta-blockers. -Vital signs and blood pressure are well controlled  -Continue to monitor.  6-depression with psychotic features -Continue Risperdal -No hallucinations and a stable mood currently.  7-history of COPD -Continue Pulmicort and as needed albuterol.  8-right ankle fracture -X-ray demonstrating mildly displaced right malleolus fracture -Continue pain medication -Orthopedic service has been consulted.  9-acute respiratory failure with hypoxia -Appears to be secondary to the use of sedative medication to break up seizure-like activity -Will work on weaning oxygen supplementation down -Patient now awake oriented x2 following commands. -Prior history of COPD but not requiring oxygen supplementation prior to this admission.  10-chronic pain syndrome -Continue slowly resuming home pain management.   DVT prophylaxis: Lovenox Code Status: Full code Family Communication: Husband at bedside Disposition Plan: Follow orthopedic service recommendations, slowly continue resuming home medications and waiting on recommendations by neurology service.  Consultants:   Neurology  Orthopedic service  Procedures:   See below for x-ray reports  EEG: Demonstrating no acute epileptic waves  Antimicrobials:  Anti-infectives (From admission, onward)   None       Subjective: Patient is now awake, oriented x2 and following commands appropriately.  No fever, no chest pain, no nausea, no vomiting.  Objective: Vitals:   12/27/18 0508 12/27/18 0713 12/27/18 0900 12/27/18 1300  BP: (!) 149/97  (!) 155/91 (!) 132/91  Pulse: 95  92 97  Resp: 20  18 17   Temp: 99.5 F (37.5 C)  99.1 F (37.3 C) 98.4 F (36.9 C)  TempSrc: Oral  Oral Oral  SpO2: 99% 98% 95% 97%  Weight:      Height:        Intake/Output Summary (Last 24 hours) at 12/27/2018 1418 Last data filed at 12/27/2018 1300 Gross per 24 hour  Intake 1781.59  ml  Output 1950 ml  Net -168.41 ml   Filed Weights   12/26/18 2248  Weight: 91.3 kg    Examination:  General exam: Alert,  awake, oriented x 2; following commands appropriately and denying any chest pain, nausea or vomiting.  Reports to be hungry and is complaining of severe pain in her right foot. Respiratory system: Good air movement bilaterally, positive rhonchi right, no wheezing; no using accessory muscles. Cardiovascular system:RRR. No murmurs, rubs, gallops. Gastrointestinal system: Abdomen is nondistended, soft and nontender. No organomegaly or masses felt. Normal bowel sounds heard. Central nervous system: Alert and oriented. No focal neurological deficits. Extremities: No cyanosis; decreased range of motion in her right foot, positive swelling tenderness to palpation and bruise around her ankle.  On her left lower extremity there is external bruise appreciation in her ankle area as well, there is mild tenderness to palpation but patient preserving range of motion.  No crepitations appreciated on exam. Skin: No no petechiae, no open wounds.  Large bruise and swelling on her right ankle; positive bruise on her external malleolus on the left foot. Psychiatry: Mood & affect appropriate.     Data Reviewed: I have personally reviewed following labs and imaging studies  CBC: Recent Labs  Lab 12/26/18 0014 12/26/18 0418 12/27/18 0422  WBC 15.9*  --  12.5*  NEUTROABS 12.6*  --   --   HGB 15.5*  --  14.9  HCT 47.8* 44.5 46.6*  MCV 92.3  --  94.1  PLT 291  --  659   Basic Metabolic Panel: Recent Labs  Lab 12/26/18 0014 12/27/18 0422  NA 138 141  K 3.3* 4.1  CL 105 108  CO2 21* 27  GLUCOSE 215* 110*  BUN 16 11  CREATININE 1.08* 0.83  CALCIUM 8.9 8.8*  MG 2.2  --    GFR: Estimated Creatinine Clearance: 79.6 mL/min (by C-G formula based on SCr of 0.83 mg/dL). Liver Function Tests: Recent Labs  Lab 12/26/18 0014  AST 12*  ALT 16  ALKPHOS 80  BILITOT 0.8  PROT 6.9  ALBUMIN 4.0    Recent Labs  Lab 12/26/18 0418  AMMONIA 26   Cardiac Enzymes: Recent Labs  Lab 12/26/18 0014  CKTOTAL  61   HbA1C: Recent Labs    12/26/18 0014  HGBA1C 6.1*   CBG: Recent Labs  Lab 12/26/18 1700 12/26/18 1852 12/26/18 2203 12/27/18 0721 12/27/18 1111  GLUCAP 124* 142* 122* 108* 80     Thyroid Function Tests: Recent Labs    12/26/18 0014  TSH 2.719   Anemia Panel: Recent Labs    12/26/18 0014  VITAMINB12 190   Urine analysis:    Component Value Date/Time   COLORURINE AMBER (A) 12/26/2018 0001   APPEARANCEUR HAZY (A) 12/26/2018 0001   LABSPEC 1.024 12/26/2018 0001   PHURINE 5.0 12/26/2018 0001   GLUCOSEU NEGATIVE 12/26/2018 0001   HGBUR MODERATE (A) 12/26/2018 0001   BILIRUBINUR NEGATIVE 12/26/2018 0001   KETONESUR NEGATIVE 12/26/2018 0001   PROTEINUR NEGATIVE 12/26/2018 0001   UROBILINOGEN 0.2 02/21/2012 0055   NITRITE NEGATIVE 12/26/2018 0001   LEUKOCYTESUR NEGATIVE 12/26/2018 0001    Recent Results (from the past 240 hour(s))  SARS CORONAVIRUS 2 Nasal Swab Aptima Multi Swab     Status: None   Collection Time: 12/24/18  7:00 AM   Specimen: Aptima Multi Swab; Nasal Swab  Result Value Ref Range Status   SARS Coronavirus 2 NEGATIVE NEGATIVE Final    Comment: (NOTE) SARS-CoV-2 target  nucleic acids are NOT DETECTED. The SARS-CoV-2 RNA is generally detectable in upper and lower respiratory specimens during the acute phase of infection. Negative results do not preclude SARS-CoV-2 infection, do not rule out co-infections with other pathogens, and should not be used as the sole basis for treatment or other patient management decisions. Negative results must be combined with clinical observations, patient history, and epidemiological information. The expected result is Negative. Fact Sheet for Patients: SugarRoll.be Fact Sheet for Healthcare Providers: https://www.woods-mathews.com/ This test is not yet approved or cleared by the [place] FDA and  has been authorized for detection and/or diagnosis of SARS-CoV-2 by  FDA under an Emergency Use Authorization (EUA). This EUA will remain  in effect (meaning this test can be used) for the duration of the COVID-19 declaration under Section 56 4(b)(1) of the Act, 21 U.S.C. section 360bbb-3(b)(1), unless the authorization is terminated or revoked sooner. Performed at [facility], [place] [address] [place], [place] [address]   SARS Coronavirus 2 [facility] order, Performed in [facility] hospital lab) Nasopharyngeal Nasopharyngeal Swab     Status: None   Collection Time: 12/26/18 12:10 AM   Specimen: Nasopharyngeal Swab  Result Value Ref Range Status   SARS Coronavirus 2 NEGATIVE NEGATIVE Final    Comment: (NOTE) If result is NEGATIVE SARS-CoV-2 target nucleic acids are NOT DETECTED. The SARS-CoV-2 RNA is generally detectable in upper and lower  respiratory specimens during the acute phase of infection. The lowest  concentration of SARS-CoV-2 viral copies this assay can detect is 250  copies / mL. A negative result does not preclude SARS-CoV-2 infection  and should not be used as the sole basis for treatment or other  patient management decisions.  A negative result may occur with  improper specimen collection / handling, submission of specimen other  than nasopharyngeal swab, presence of viral mutation(s) within the  areas targeted by this assay, and inadequate number of viral copies  (<250 copies / mL). A negative result must be combined with clinical  observations, patient history, and epidemiological information. If result is POSITIVE SARS-CoV-2 target nucleic acids are DETECTED. The SARS-CoV-2 RNA is generally detectable in upper and lower  respiratory specimens dur ing the acute phase of infection.  Positive  results are indicative of active infection with SARS-CoV-2.  Clinical  correlation with patient history and other diagnostic information is  necessary to determine patient infection status.  Positive results do  not rule out bacterial  infection or co-infection with other viruses. If result is PRESUMPTIVE POSTIVE SARS-CoV-2 nucleic acids MAY BE PRESENT.   A presumptive positive result was obtained on the submitted specimen  and confirmed on repeat testing.  While 2019 novel coronavirus  (SARS-CoV-2) nucleic acids may be present in the submitted sample  additional confirmatory testing may be necessary for epidemiological  and / or clinical management purposes  to differentiate between  SARS-CoV-2 and other Sarbecovirus currently known to infect humans.  If clinically indicated additional testing with an alternate test  methodology [phone]) is advised. The SARS-CoV-2 RNA is generally  detectable in upper and lower respiratory sp ecimens during the acute  phase of infection. The expected result is Negative. Fact Sheet for Patients:  StrictlyIdeas.no Fact Sheet for Healthcare Providers: BankingDealers.co.za This test is not yet approved or cleared by the [place] FDA and has been authorized for detection and/or diagnosis of SARS-CoV-2 by FDA under an Emergency Use Authorization (EUA).  This EUA will remain in effect (meaning this test can  be used) for the duration of the COVID-19 declaration under Section 564(b)(1) of the Act, 21 U.S.C. section 360bbb-3(b)(1), unless the authorization is terminated or revoked sooner. Performed at [facility], [address] [place], [place] [address]          Radiology Studies: Ct Head Wo Contrast  Result Date: 12/26/2018 CLINICAL DATA:  Altered mental status EXAM: CT HEAD WITHOUT CONTRAST TECHNIQUE: Contiguous axial images were obtained from the base of the skull through the vertex without intravenous contrast. COMPARISON:  02/21/2012 FINDINGS: Brain: No evidence of acute infarction, hemorrhage, hydrocephalus, extra-axial collection or mass lesion/mass effect. Vascular: No hyperdense vessel or unexpected calcification. Skull:  Normal. Negative for fracture or focal lesion. Sinuses/Orbits: No acute finding. Other: None. IMPRESSION: No acute intracranial abnormality noted. Electronically Signed   By: Inez Catalina M.D.   On: 12/26/2018 01:49   Mr Brain Wo Contrast  Result Date: 12/26/2018 CLINICAL DATA:  New seizure.  Altered mental status. EXAM: MRI HEAD WITHOUT CONTRAST TECHNIQUE: Multiplanar, multiecho pulse sequences of the brain and surrounding structures were obtained without intravenous contrast. COMPARISON:  Head CT same day.  MRI 02/21/2012. FINDINGS: Complete exam not performed because of patient motion and condition. Brain: Diffusion imaging does not show any acute or subacute infarction. The brainstem and cerebellum are normal. Cerebral hemispheres show mild volume loss without evidence of old or acute infarction. No mass lesion, hemorrhage, hydrocephalus or extra-axial collection. No temporal lobe lesion is seen, though coronal imaging was not performed. Vascular: Major vessels at the base of the brain show flow. Skull and upper cervical spine: Negative Sinuses/Orbits: Clear/normal Other: None IMPRESSION: No acute or focal finding. Generalized atrophy. Complete exam not performed because of patient condition. No abnormality seen to explain seizure. Electronically Signed   By: Nelson Chimes M.D.   On: 12/26/2018 08:43   Dg Chest Port 1 View  Result Date: 12/26/2018 CLINICAL DATA:  Altered mental status EXAM: PORTABLE CHEST 1 VIEW COMPARISON:  06/27/2016 FINDINGS: Heart is upper limits normal in size. Lungs clear. No effusions or edema. No acute bony abnormality. IMPRESSION: No active disease. Electronically Signed   By: Rolm Baptise M.D.   On: 12/26/2018 00:38   Dg Foot Complete Right  Result Date: 12/27/2018 CLINICAL DATA:  Pain and swelling medial foot.  Fall 2 days ago EXAM: RIGHT FOOT COMPLETE - 3+ VIEW COMPARISON:  07/19/2018 FINDINGS: Transverse fracture of the medial malleolus appears acute. Recommend ankle x-rays.  Negative for foot fracture. Lateral subluxation of the fifth PIP joint with degenerative changes chronic and unchanged from the prior study. IMPRESSION: Fracture medial malleolus.  Recommend right ankle x-ray series. Electronically Signed   By: Franchot Gallo M.D.   On: 12/27/2018 11:59   Scheduled Meds: . budesonide  0.5 mg Nebulization Daily  . enoxaparin (LOVENOX) injection  40 mg Subcutaneous Q24H  . insulin aspart  0-9 Units Subcutaneous TID WC  . metoprolol tartrate  5 mg Intravenous Q8H  . risperiDONE  0.5 mg Oral BID  . rosuvastatin  20 mg Oral Daily  . senna  1 tablet Oral BID   Continuous Infusions: . sodium chloride 75 mL/hr (12/27/18 1220)  . levETIRAcetam 500 mg (12/27/18 0814)     LOS: 0 days    Time spent: 30 minutes    Barton Dubois, MD [facility] Pager [phone]   12/27/2018, 2:18 PM

## 2018-12-27 NOTE — Telephone Encounter (Signed)
Noted, thanks for the heads up!

## 2018-12-28 LAB — GLUCOSE, CAPILLARY
Glucose-Capillary: 97 mg/dL (ref 70–99)
Glucose-Capillary: 101 mg/dL — ABNORMAL HIGH (ref 70–99)
Glucose-Capillary: 80 mg/dL (ref 70–99)
Glucose-Capillary: 101 mg/dL — ABNORMAL HIGH (ref 70–99)
Glucose-Capillary: 107 mg/dL — ABNORMAL HIGH (ref 70–99)

## 2018-12-28 NOTE — Consult Note (Signed)
[place] A. Merlene Laughter, MD     [email or web address]          Theresa Pace is an 58 y.o. female.   ASSESSMENT/PLAN: 1.  Recurrent seizures that appears to be mostly unprovoked.  The patient therefore has a increased risk of developing epilepsy long-term.  I agree with the use of Keppra 500 twice a day.  Seizure precaution is warranted and discussed with the patient.  She is on few psychotropic medications but I do not believe that these are the culprit of her seizures.    The patient is a 58 year old white female who presents with generalized tonic-clonic seizures.  This is her third event in the last year.  She had one earlier this year in February and one few months before then.  1 of her prior seizures was associated with the patient being on combination of Seroquel and Cymbalta.  It was believed that the combination may have caused her seizures.  She is on Adderall and apparently has been on this medication for a while.  She has had no new medications recently other than for possibly the initiation of an NSAID.  The patient's current event was not associated with oral trauma, bladder urinary incontinence.  There is no history of head injuries or family history of seizures.  No reports of CNS infections or stroke.  The patient was ambulating when she had the spell which was witnessed by her husband.  The.  She fell and injured her lower extremities and actually fractured the right ankle.  She also reports having some bruise on the left.  The review of systems otherwise negative.    GENERAL: This a pleasant moderately overweight female who is in no acute distress.  HEENT: No oral trauma appreciated.  Neck is supple.  ABDOMEN: soft  EXTREMITIES: No edema; the left ankle is somewhat painful.  The right ankle is in a brace.  She is status post right total knee arthroplasty.  BACK: Normal  SKIN: Normal by inspection.    MENTAL STATUS: Alert and oriented. Speech, language  and cognition are generally intact. Judgment and insight normal.   CRANIAL NERVES: Pupils are equal, round and reactive to light and accomodation; extra ocular movements are full, there is no significant nystagmus; visual fields are full; upper and lower facial muscles are normal in strength and symmetric, there is no flattening of the nasolabial folds; tongue is midline; uvula is midline; shoulder elevation is normal.  MOTOR: The right leg is weak but is in the brace.  The strength is graded as 2/5.  The other extremity shows normal tone, bulk and strength.  COORDINATION: Left finger to nose is normal, right finger to nose is normal, No rest tremor; no intention tremor; no postural tremor; no bradykinesia.  REFLEXES: Deep tendon reflexes are symmetrical and normal. Plantar reflexes are flexor bilaterally.   SENSATION: Normal to light touch, temperature, and pain.      Blood pressure (!) 141/81, pulse 85, temperature 98.6 F (37 C), resp. rate 20, height 5\' 2"  (1.575 m), weight 91.3 kg, SpO2 99 %.  Past Medical History:  Diagnosis Date  . Arthritis   . Asthma   . COPD (chronic obstructive pulmonary disease) ([place])   . Depression   . Fibromyalgia   . Frequent urination at night   . GERD (gastroesophageal reflux disease)   . Hypercholesteremia   . IBS (irritable bowel syndrome)   . Tachycardia    treated with Cardizem  . Tendonitis  Past Surgical History:  Procedure Laterality Date  . ABDOMINAL HYSTERECTOMY    . APPENDECTOMY    . KNEE ARTHROPLASTY Right 05/23/2017    Family History  Problem Relation Age of Onset  . Colon cancer Neg Hx     Social History:  reports that she has quit smoking. Her smoking use included cigarettes. She smoked 0.25 packs per day. She has never used smokeless tobacco. She reports that she does not drink alcohol or use drugs.  Allergies:  Allergies  Allergen Reactions  . Methadone Hives  . Aspirin Other (See Comments)    Stomach Pain     Medications: Prior to Admission medications   Medication Sig Start Date End Date Taking? Authorizing Provider  albuterol (PROVENTIL HFA;VENTOLIN HFA) 108 (90 BASE) MCG/ACT inhaler Inhale 2 puffs into the lungs every 6 (six) hours as needed for wheezing or shortness of breath.    Yes [provider]  amphetamine-dextroamphetamine (ADDERALL) 30 MG tablet Take 30 mg by mouth 3 (three) times daily.   Yes [provider]  aspirin EC 81 MG tablet Take 81 mg by mouth daily.   Yes [provider]  baclofen (LIORESAL) 10 MG tablet Take 10 mg by mouth 2 (two) times daily.   Yes [provider]  budesonide (PULMICORT) 0.5 MG/2ML nebulizer solution Take 2 mLs (0.5 mg total) by nebulization daily. Patient taking differently: Take 0.5 mg by nebulization 4 (four) times daily as needed (for shortness of breath or wheezing).  06/27/16  Yes Isla Pence, MD  dicyclomine (BENTYL) 10 MG capsule Take 1 capsule (10 mg total) by mouth 4 (four) times daily as needed for spasms (or diarrhea). 10/25/18  Yes Carlis Stable, NP  diltiazem (DILACOR XR) 120 MG 24 hr capsule Take 120 mg by mouth daily.   Yes [provider]  HYDROcodone-acetaminophen (NORCO/VICODIN) 5-325 MG tablet Take 1 tablet by mouth every 8 (eight) hours as needed for moderate pain.   Yes [provider]  ipratropium-albuterol (DUONEB) 0.5-2.5 (3) MG/3ML SOLN Take 3 mLs by nebulization 4 (four) times daily. Patient taking differently: Take 3 mLs by nebulization 3 (three) times daily.  02/20/15  Yes Samuella Cota, MD  loperamide (IMODIUM A-D) 2 MG tablet Take 8 mg by mouth daily as needed for diarrhea or loose stools.   Yes [provider]  pantoprazole (PROTONIX) 40 MG tablet Take 1 tablet (40 mg total) by mouth 2 (two) times daily before a meal. 07/11/16  Yes Orvan Falconer, MD  risperiDONE (RISPERDAL) 0.5 MG tablet Take 0.5 mg by mouth 2 (two) times daily.   Yes [provider]   rosuvastatin (CRESTOR) 20 MG tablet Take 20 mg by mouth daily.   Yes [provider]  solifenacin (VESICARE) 5 MG tablet Take 5 mg by mouth daily.   Yes [provider]  traZODone (DESYREL) 50 MG tablet Take 150 mg by mouth at bedtime.    Yes [provider]  Vitamin D, Ergocalciferol, (DRISDOL) 1.25 MG (50000 UT) CAPS capsule Take 50,000 Units by mouth every 7 (seven) days.   Yes [provider]    Scheduled Meds: . budesonide  0.5 mg Nebulization Daily  . diltiazem  120 mg Oral Daily  . enoxaparin (LOVENOX) injection  40 mg Subcutaneous Q24H  . insulin aspart  0-9 Units Subcutaneous TID WC  . levETIRAcetam  500 mg Oral BID  . risperiDONE  0.5 mg Oral BID  . rosuvastatin  20 mg Oral Daily  . senna  1 tablet Oral BID   Continuous Infusions: . sodium chloride Stopped (12/28/18 1655)   PRN Meds:.albuterol, bisacodyl, dicyclomine, HYDROcodone-acetaminophen, metoCLOPramide (REGLAN) injection, polyethylene glycol     Results for orders placed or performed during the hospital encounter of 12/25/18 (from the past 48 hour(s))  CBG monitoring, ED     Status: Abnormal   Collection Time: 12/26/18  6:52 PM  Result Value Ref Range   Glucose-Capillary 142 (H) 70 - 99 mg/dL  Glucose, capillary     Status: Abnormal   Collection Time: 12/26/18 10:03 PM  Result Value Ref Range   Glucose-Capillary 122 (H) 70 - 99 mg/dL  CBC     Status: Abnormal   Collection Time: 12/27/18  4:22 AM  Result Value Ref Range   WBC 12.5 (H) 4.0 - 10.5 K/uL   RBC 4.95 3.87 - 5.11 MIL/uL   Hemoglobin 14.9 12.0 - 15.0 g/dL   HCT 46.6 (H) 36.0 - 46.0 %   MCV 94.1 80.0 - 100.0 fL   MCH 30.1 26.0 - 34.0 pg   MCHC 32.0 30.0 - 36.0 g/dL   RDW 14.4 11.5 - 15.5 %   Platelets 306 150 - 400 K/uL   nRBC 0.0 0.0 - 0.2 %    Comment: Performed at [facility], [address] [place], [place] [address]  Basic metabolic panel     Status: Abnormal   Collection Time: 12/27/18  4:22 AM   Result Value Ref Range   Sodium 141 135 - 145 mmol/L   Potassium 4.1 3.5 - 5.1 mmol/L    Comment: DELTA CHECK NOTED   Chloride 108 98 - 111 mmol/L   CO2 27 22 - 32 mmol/L   Glucose, Bld 110 (H) 70 - 99 mg/dL   BUN 11 6 - 20 mg/dL   Creatinine, Ser 0.83 0.44 - 1.00 mg/dL   Calcium 8.8 (L) 8.9 - 10.3 mg/dL   GFR calc non Af Amer >60 >60 mL/min   GFR calc Af Amer >60 >60 mL/min   Anion gap 6 5 - 15    Comment: Performed at [facility], [address] [place], [place] [address]  Glucose, capillary     Status: Abnormal   Collection Time: 12/27/18  7:21 AM  Result Value Ref Range   Glucose-Capillary 108 (H) 70 - 99 mg/dL  Glucose, capillary     Status: None   Collection Time: 12/27/18 11:11 AM  Result Value Ref Range   Glucose-Capillary 80 70 - 99 mg/dL  Glucose, capillary     Status: Abnormal   Collection Time: 12/27/18  4:01 PM  Result Value Ref Range   Glucose-Capillary 158 (H) 70 - 99 mg/dL  Glucose, capillary     Status: None   Collection Time: 12/27/18 10:05 PM  Result Value Ref Range   Glucose-Capillary 97 70 - 99 mg/dL   Comment 1 Notify RN    Comment 2 Document in Chart   Glucose, capillary     Status: None   Collection Time: 12/28/18  3:11 AM  Result Value Ref Range   Glucose-Capillary 97 70 - 99 mg/dL   Comment 1 Notify RN    Comment 2 Document in Chart   Glucose, capillary     Status: Abnormal   Collection Time: 12/28/18  7:33 AM  Result Value Ref Range   Glucose-Capillary 101 (H) 70 - 99 mg/dL  Glucose, capillary     Status: None   Collection Time: 12/28/18 11:33 AM  Result Value Ref Range  Glucose-Capillary 80 70 - 99 mg/dL  Glucose, capillary     Status: Abnormal   Collection Time: 12/28/18  4:11 PM  Result Value Ref Range   Glucose-Capillary 101 (H) 70 - 99 mg/dL    Studies/Results:  EEG IMPRESSION: This study is suggestive of moderate diffuse encephalopathy. No seizures or clear epileptiform discharges were seen throughout the recording.     BRAIN MRI FINDINGS: Complete exam not performed because of patient motion and condition.  Brain: Diffusion imaging does not show any acute or subacute infarction. The brainstem and cerebellum are normal. Cerebral hemispheres show mild volume loss without evidence of old or acute infarction. No mass lesion, hemorrhage, hydrocephalus or extra-axial collection. No temporal lobe lesion is seen, though coronal imaging was not performed.  Vascular: Major vessels at the base of the brain show flow.  Skull and upper cervical spine: Negative  Sinuses/Orbits: Clear/normal  Other: None  IMPRESSION: No acute or focal finding. Generalized atrophy. Complete exam not performed because of patient condition. No abnormality seen to explain seizure.   The brain MRI is reviewed in person.  No lesions are noted.  No abnormalities seen on the T2 coronal indicating mesial temporal sclerosis.  No significant primary lesions are noted and no encephalomalacia.  The scan is normal.   Darvis Croft A. Merlene Laughter, M.D.  Diplomate, Tax adviser of Psychiatry and Neurology ( Neurology). 12/28/2018, 5:34 PM

## 2018-12-28 NOTE — Plan of Care (Signed)
  Problem: Acute Rehab PT Goals(only PT should resolve) Goal: Pt Will Go Supine/Side To Sit Outcome: Progressing Flowsheets (Taken 12/28/2018 1537) Pt will go Supine/Side to Sit: Independently Goal: Patient Will Perform Sitting Balance Outcome: Progressing Flowsheets (Taken 12/28/2018 1537) Patient will perform sitting balance: with modified independence Goal: Patient Will Transfer Sit To/From Stand Outcome: Progressing Flowsheets (Taken 12/28/2018 1537) Patient will transfer sit to/from stand: with modified independence Goal: Pt Will Ambulate Outcome: Progressing Flowsheets (Taken 12/28/2018 1537) Pt will Ambulate:  75 feet  with supervision  with rolling walker   3:37 PM, 12/28/18 Lonell Grandchild, MPT Physical Therapist with [facility] 336 [phone] office [phone] mobile phone

## 2018-12-28 NOTE — Progress Notes (Signed)
PROGRESS NOTE    Theresa Pace  BMW:[id number] DOB: 03-Apr-1961 DOA: 12/25/2018 PCP: Caryl Bis, MD     Brief Narrative:  58 y.o. female, with history of COPD, depression, fibromyalgia, GERD, hypercholesterolemia, who presents to the ED today via EMS.  EMS was apparently called to the house due to suspected allergic reaction.  Patient then had seizure-like activity in front of them and became combative.  Patient was given 5 of Versed in route to the ED.  After the 5 Versed patient became hypoxic and required oxygen supplementation with a 2 L nasal cannula.  Patient has not returned to baseline mentation in the 2-1/2 hours that she has been in the ER.  Unfortunately she cannot give [place] any history.  EMS reports that they tried to obtain history from husband, and it was ultimately unfruitful.    Assessment & Plan: 1-acute metabolic encephalopathy -Negative CT and MRI -EEG no revealing of epileptic waveforms activity -Neurology consult pending -oriented X 3 currently and following commands appropriately  -No focal deficits appreciated unable to follow commands appropriately -Continue minimizing medications that can alter mentation. -Will relied on neurology recommendations for the need of antiepileptic drugs long-term. For now continue keppra. -TSH and B12 within normal limits.  2-dysphasia -On presentation due to altered mentation. -Evaluated by speech therapy and recommendations given for regular diet with thin liquids. -No difficulty swallowing.  3-prediabetes -A1c repeated at 6.1 -Continue modified carbohydrate diet and close outpatient follow-up.  4-hyperlipidemia -Continue Crestor  5-hypertension -Continue Cardizem. -Vital signs and blood pressure are well controlled -Continue to monitor.  6-depression with psychotic features -Continue Risperdal -No hallucinations and a stable mood currently.  7-history of COPD -Continue Pulmicort and as needed albuterol. -No  wheezing on exam.  8-right ankle fracture -X-ray demonstrating mildly displaced right malleolus fracture -Continue pain medication -Appreciate recommendations by orthopedic service -Weightbearing as tolerated while using CAM Walker.  9-acute respiratory failure with hypoxia -Appears to be secondary to the use of sedative medication to break up seizure-like activity -Denies shortness of breath and currently no requiring oxygen supplementation. -Good O2 sat on room air. -Prior history of COPD but not requiring oxygen supplementation prior to this admission.  10-chronic pain syndrome -Continue slowly resuming home pain management.   DVT prophylaxis: Lovenox Code Status: Full code Family Communication: Husband at bedside Disposition Plan: Follow orthopedic service recommendations, slowly continue resuming home medications and waiting on recommendations by neurology service prior to discharge. Will have PT evaluation to assess safety at discharge.   Consultants:   Neurology  Orthopedic service  Procedures:   See below for x-ray reports  EEG: Demonstrating no acute epileptic waves  Antimicrobials:  Anti-infectives (From admission, onward)   None       Subjective: No chest pain, no shortness of breath, no nausea, no vomiting.  Patient oriented x3 and no further seizure activity appreciated.  Continue complaining of right foot pain, decreased range of motion and inability to bear weight currently.  Objective: Vitals:   12/28/18 0739 12/28/18 0757 12/28/18 1000 12/28/18 1312  BP:  118/86 137/77 (!) 141/81  Pulse:   81 85  Resp:   19 20  Temp:   98.1 F (36.7 C) 98.6 F (37 C)  TempSrc:   Oral   SpO2: 98%  98% 99%  Weight:      Height:        Intake/Output Summary (Last 24 hours) at 12/28/2018 1435 Last data filed at 12/28/2018 1300 Gross per 24 hour  Intake 960 ml  Output 900 ml  Net 60 ml   Filed Weights   12/26/18 2248  Weight: 91.3 kg    Examination:  General exam: Alert, awake, oriented to place, person and time; patient is afebrile, denies chest pain or shortness of breath.  Still complaining of right ankle pain.  No further seizure activity appreciated. Respiratory system: Clear to auscultation. Respiratory effort normal.  No requiring oxygen supplementation at this time. Cardiovascular system:RRR. No murmurs, rubs, gallops. Gastrointestinal system: Abdomen is nondistended, soft and nontender. No organomegaly or masses felt. Normal bowel sounds heard. Central nervous system: Alert and oriented. No focal neurological deficits. Extremities: No cyanosis or clubbing.  Decreased range of motion in her right foot, slightly decreasing swelling, still tender to palpation and positive bruise appreciated on exam.  Left lower extremity with bruise appreciated in the sternal aspect of her foot, preserved range of motion, no crepitations. Skin: No rashes, no petechiae. Psychiatry: Judgement and insight appear normal currently. Mood & affect appropriate.    Data Reviewed: I have personally reviewed following labs and imaging studies  CBC: Recent Labs  Lab 12/26/18 0014 12/26/18 0418 12/27/18 0422  WBC 15.9*  --  12.5*  NEUTROABS 12.6*  --   --   HGB 15.5*  --  14.9  HCT 47.8* 44.5 46.6*  MCV 92.3  --  94.1  PLT 291  --  161   Basic Metabolic Panel: Recent Labs  Lab 12/26/18 0014 12/27/18 0422  NA 138 141  K 3.3* 4.1  CL 105 108  CO2 21* 27  GLUCOSE 215* 110*  BUN 16 11  CREATININE 1.08* 0.83  CALCIUM 8.9 8.8*  MG 2.2  --    GFR: Estimated Creatinine Clearance: 79.6 mL/min (by C-G formula based on SCr of 0.83 mg/dL).   Liver Function Tests: Recent Labs  Lab 12/26/18 0014  AST 12*  ALT 16  ALKPHOS 80  BILITOT 0.8  PROT 6.9  ALBUMIN 4.0    Recent Labs  Lab 12/26/18 0418  AMMONIA 26   Cardiac Enzymes: Recent Labs  Lab 12/26/18 0014  CKTOTAL 61   HbA1C: Recent Labs    12/26/18 0014  HGBA1C 6.1*   CBG:  Recent Labs  Lab 12/27/18 1601 12/27/18 2205 12/28/18 0311 12/28/18 0733 12/28/18 1133  GLUCAP 158* 97 97 101* 80     Thyroid Function Tests: Recent Labs    12/26/18 0014  TSH 2.719   Anemia Panel: Recent Labs    12/26/18 0014  VITAMINB12 190   Urine analysis:    Component Value Date/Time   COLORURINE AMBER (A) 12/26/2018 0001   APPEARANCEUR HAZY (A) 12/26/2018 0001   LABSPEC 1.024 12/26/2018 0001   PHURINE 5.0 12/26/2018 0001   GLUCOSEU NEGATIVE 12/26/2018 0001   HGBUR MODERATE (A) 12/26/2018 0001   BILIRUBINUR NEGATIVE 12/26/2018 0001   KETONESUR NEGATIVE 12/26/2018 0001   PROTEINUR NEGATIVE 12/26/2018 0001   UROBILINOGEN 0.2 02/21/2012 0055   NITRITE NEGATIVE 12/26/2018 0001   LEUKOCYTESUR NEGATIVE 12/26/2018 0001    Recent Results (from the past 240 hour(s))  SARS CORONAVIRUS 2 Nasal Swab Aptima Multi Swab     Status: None   Collection Time: 12/24/18  7:00 AM   Specimen: Aptima Multi Swab; Nasal Swab  Result Value Ref Range Status   SARS Coronavirus 2 NEGATIVE NEGATIVE Final    Comment: (NOTE) SARS-CoV-2 target nucleic acids are NOT DETECTED. The SARS-CoV-2 RNA is generally detectable in upper and lower respiratory specimens during the acute phase  of infection. Negative results do not preclude SARS-CoV-2 infection, do not rule out co-infections with other pathogens, and should not be used as the sole basis for treatment or other patient management decisions. Negative results must be combined with clinical observations, patient history, and epidemiological information. The expected result is Negative. Fact Sheet for Patients: SugarRoll.be Fact Sheet for Healthcare Providers: https://www.woods-mathews.com/ This test is not yet approved or cleared by the [place] FDA and  has been authorized for detection and/or diagnosis of SARS-CoV-2 by FDA under an Emergency Use Authorization (EUA). This EUA will remain   in effect (meaning this test can be used) for the duration of the COVID-19 declaration under Section 56 4(b)(1) of the Act, 21 U.S.C. section 360bbb-3(b)(1), unless the authorization is terminated or revoked sooner. Performed at [facility], [place] [address] [place], [place] [address]   SARS Coronavirus 2 [facility] order, Performed in [facility] hospital lab) Nasopharyngeal Nasopharyngeal Swab     Status: None   Collection Time: 12/26/18 12:10 AM   Specimen: Nasopharyngeal Swab  Result Value Ref Range Status   SARS Coronavirus 2 NEGATIVE NEGATIVE Final    Comment: (NOTE) If result is NEGATIVE SARS-CoV-2 target nucleic acids are NOT DETECTED. The SARS-CoV-2 RNA is generally detectable in upper and lower  respiratory specimens during the acute phase of infection. The lowest  concentration of SARS-CoV-2 viral copies this assay can detect is 250  copies / mL. A negative result does not preclude SARS-CoV-2 infection  and should not be used as the sole basis for treatment or other  patient management decisions.  A negative result may occur with  improper specimen collection / handling, submission of specimen other  than nasopharyngeal swab, presence of viral mutation(s) within the  areas targeted by this assay, and inadequate number of viral copies  (<250 copies / mL). A negative result must be combined with clinical  observations, patient history, and epidemiological information. If result is POSITIVE SARS-CoV-2 target nucleic acids are DETECTED. The SARS-CoV-2 RNA is generally detectable in upper and lower  respiratory specimens dur ing the acute phase of infection.  Positive  results are indicative of active infection with SARS-CoV-2.  Clinical  correlation with patient history and other diagnostic information is  necessary to determine patient infection status.  Positive results do  not rule out bacterial infection or co-infection with other viruses. If result is PRESUMPTIVE  POSTIVE SARS-CoV-2 nucleic acids MAY BE PRESENT.   A presumptive positive result was obtained on the submitted specimen  and confirmed on repeat testing.  While 2019 novel coronavirus  (SARS-CoV-2) nucleic acids may be present in the submitted sample  additional confirmatory testing may be necessary for epidemiological  and / or clinical management purposes  to differentiate between  SARS-CoV-2 and other Sarbecovirus currently known to infect humans.  If clinically indicated additional testing with an alternate test  methodology [phone]) is advised. The SARS-CoV-2 RNA is generally  detectable in upper and lower respiratory sp ecimens during the acute  phase of infection. The expected result is Negative. Fact Sheet for Patients:  StrictlyIdeas.no Fact Sheet for Healthcare Providers: BankingDealers.co.za This test is not yet approved or cleared by the [place] FDA and has been authorized for detection and/or diagnosis of SARS-CoV-2 by FDA under an Emergency Use Authorization (EUA).  This EUA will remain in effect (meaning this test can be used) for the duration of the COVID-19 declaration under Section 564(b)(1) of the Act, 21 U.S.C. section 360bbb-3(b)(1), unless the  authorization is terminated or revoked sooner. Performed at [facility], [address] [place], [place] [address]      Radiology Studies: Dg Ankle Complete Right  Result Date: 12/27/2018 CLINICAL DATA:  Pain swelling and redness at medial aspect of RIGHT foot/ankle after falling 2 days ago EXAM: RIGHT ANKLE - COMPLETE 3+ VIEW COMPARISON:  07/19/2018 FINDINGS: Diffuse soft tissue swelling, greatest medially. Osseous demineralization. Joint spaces preserved. Transverse mildly displaced fracture of the medial malleolus. No additional fracture, dislocation, or bone destruction. IMPRESSION: Mildly displaced transverse fracture of the RIGHT medial malleolus. Electronically  Signed   By: Lavonia Dana M.D.   On: 12/27/2018 15:06   Dg Foot Complete Right  Result Date: 12/27/2018 CLINICAL DATA:  Pain and swelling medial foot.  Fall 2 days ago EXAM: RIGHT FOOT COMPLETE - 3+ VIEW COMPARISON:  07/19/2018 FINDINGS: Transverse fracture of the medial malleolus appears acute. Recommend ankle x-rays. Negative for foot fracture. Lateral subluxation of the fifth PIP joint with degenerative changes chronic and unchanged from the prior study. IMPRESSION: Fracture medial malleolus.  Recommend right ankle x-ray series. Electronically Signed   By: Franchot Gallo M.D.   On: 12/27/2018 11:59   Scheduled Meds: . budesonide  0.5 mg Nebulization Daily  . diltiazem  120 mg Oral Daily  . enoxaparin (LOVENOX) injection  40 mg Subcutaneous Q24H  . insulin aspart  0-9 Units Subcutaneous TID WC  . levETIRAcetam  500 mg Oral BID  . risperiDONE  0.5 mg Oral BID  . rosuvastatin  20 mg Oral Daily  . senna  1 tablet Oral BID   Continuous Infusions: . sodium chloride 75 mL/hr (12/28/18 0454)     LOS: 1 day    Time spent: 30 minutes    Barton Dubois, MD [facility] Pager [phone]   12/28/2018, 2:35 PM

## 2018-12-28 NOTE — TOC Initial Note (Addendum)
Transition of Care [facility]) - Initial/Assessment Note    Patient Details  Name: Theresa Pace MRN: [id number] Date of Birth: 1960-09-06  Transition of Care [facility]) CM/SW Contact:    Nel Stoneking, Chauncey Reading, RN Phone Number: 12/28/2018, 3:35 PM  Clinical Narrative:     58 y.o.female,with history of COPD, depression, fibromyalgia, GERD, hypercholesterolemia. Ankle fracture, seen by ortho, CAM walker and WBAT. PT recommends HH PT. Discussed with patient, she is agreeable, no preference. Referral given to [facility].             Will need HH PT orders.   Expected Discharge Plan: [place] Barriers to Discharge: No Barriers Identified   Patient Goals and CMS Choice Patient states their goals for this hospitalization and ongoing recovery are:: return home   Choice offered to / list presented to : Patient  Expected Discharge Plan and Services Expected Discharge Plan: [place] Acute Care Choice: [place] arrangements for the past 2 months: [place]: PT [place]: [place] ([place]) Date [place]: 12/28/18 Time [place]: [place] Representative spoke with at [place]: Vaughan Basta  Prior Living Arrangements/Services Living arrangements for the past 2 months: [place] Lives with:: Spouse Patient language and need for interpreter reviewed:: Yes Do you feel safe going back to the place where you live?: Yes      Need for Family Participation in Patient Care: Yes (Comment) Care giver support system in place?: Yes (comment) Current home services: Home PT Criminal Activity/Legal Involvement Pertinent to Current Situation/Hospitalization: No - Comment as needed  Activities of Daily Living Home Assistive Devices/Equipment: None ADL Screening (condition at time of admission) Patient's cognitive ability adequate to safely complete  daily activities?: Yes Is the patient deaf or have difficulty hearing?: No Does the patient have difficulty seeing, even when wearing glasses/contacts?: No Does the patient have difficulty concentrating, remembering, or making decisions?: No Patient able to express need for assistance with ADLs?: Yes Does the patient have difficulty dressing or bathing?: No Independently performs ADLs?: Yes (appropriate for developmental age) Does the patient have difficulty walking or climbing stairs?: No Weakness of Legs: None Weakness of Arms/Hands: None  Permission Sought/Granted   Permission granted to share information with : Yes, Verbal Permission Granted     Permission granted to share info w AGENCY: [facility]        Emotional Assessment   Attitude/Demeanor/Rapport: Engaged Affect (typically observed): Accepting Orientation: : Oriented to Self, Oriented to Place, Oriented to  Time Alcohol / Substance Use: Not Applicable Psych Involvement: No (comment)  Admission diagnosis:  Hypokalemia [E87.6] Prolonged Q-T interval on ECG [R94.31] Seizure-like activity (HCC) [R56.9] Altered mental status, unspecified altered mental status type [R41.82] Elevated glucose level [R73.09] Patient Active Problem List   Diagnosis Date Noted  . Acute metabolic encephalopathy 93/71/6967  . Altered mental status   . Seizure-like activity ([place])   . Hypokalemia   . Elevated glucose level   . Prolonged Q-T interval on ECG   . IBS (irritable bowel syndrome) 06/06/2018  . History of colon polyps 06/06/2018  . Acute respiratory distress 07/07/2016  . Asthma exacerbation 07/07/2016  . Sinus tachycardia 10/22/2015  . Acute exacerbation of chronic obstructive pulmonary disease (COPD) (  [place]) 10/21/2015  . Anxiety 10/21/2015  . GERD (gastroesophageal reflux disease) 10/21/2015  . Chronic pain 10/21/2015  . Hyperlipidemia 10/21/2015  . Acute respiratory failure with hypoxia ([place]) 02/18/2015  . COPD  (chronic obstructive pulmonary disease) ([place]) 02/16/2015  . COPD exacerbation ([place]) 02/16/2015  . Mycoplasma pneumonia 02/16/2015  . Seizure ([place]) 02/22/2012  . Bleeding hemorrhoid 02/22/2012  . Acute encephalopathy 02/21/2012  . Asthma 02/21/2012  . Depression 02/21/2012   PCP:  Caryl Bis, MD Pharmacy:   [place], [place] [address] [place] [address] Phone: [phone] Fax: [phone]     Social Determinants of Health (SDOH) Interventions    Readmission Risk Interventions No flowsheet data found.

## 2018-12-28 NOTE — Progress Notes (Signed)
Patient ID: Theresa Pace, female   DOB: Jan 25, 1961, 58 y.o.   MRN: [id number]   58 year old female transverse medial malleolus fracture minimal displacement recommend Cam walker weight-bear as tolerated

## 2018-12-28 NOTE — Evaluation (Signed)
Physical Therapy Evaluation Patient Details Name: Theresa Pace MRN: [id number] DOB: Dec 25, 1960 Today's Date: 12/28/2018   History of Present Illness  Theresa Pace  is a 58 y.o. female, with history of COPD, depression, fibromyalgia, GERD, hypercholesterolemia, who presents to the ED today via EMS.  EMS was apparently called to the house due to suspected allergic reaction.  Patient then had seizure-like activity in front of them and became combative.  Patient was given 5 of Versed in route to the ED.  After the 5 Versed patient became hypoxic and required oxygen supplementation with a 2 L nasal cannula.  Patient has not returned to baseline mentation in the 2-1/2 hours that she has been in the ER.  Unfortunately she cannot give [place] any history.  EMS reports that they tried to obtain history from husband, and it was ultimately unfruitful.    Clinical Impression  Patient fitted for CAM-boot and demonstrates good return for sitting up at bedside without assist, slightly labored movement for sit to stands, transfers and ambulation in hallway without loss of balance, limited for ambulation mostly due to increasing right foot pain and fatigue.  Patient tolerated sitting up in chair after therapy.  Patient on room air with SpO2 at 95% during ambulation - RN notified.  Patient will benefit from continued physical therapy in hospital and recommended venue below to increase strength, balance, endurance for safe ADLs and gait.    Follow Up Recommendations Home health PT;Supervision for mobility/OOB;Supervision - Intermittent    Equipment Recommendations  None recommended by PT    Recommendations for Other Services       Precautions / Restrictions Precautions Precautions: Fall Required Braces or Orthoses: Other Brace Other Brace: cam boot right foot Restrictions Weight Bearing Restrictions: Yes RLE Weight Bearing: Weight bearing as tolerated      Mobility  Bed Mobility Overal bed mobility: Modified  Independent             General bed mobility comments: increased time  Transfers Overall transfer level: Needs assistance Equipment used: Rolling walker (2 wheeled) Transfers: Sit to/from [facility] Sit to Stand: Min guard;Supervision Stand pivot transfers: Min guard;Supervision       General transfer comment: increased time, labored movment  Ambulation/Gait Ambulation/Gait assistance: Supervision;Min guard Gait Distance (Feet): 65 Feet Assistive device: Rolling walker (2 wheeled) Gait Pattern/deviations: Decreased step length - right;Decreased stance time - right;Decreased stride length;Antalgic Gait velocity: decreased   General Gait Details: slightly labored cadence without loss of balance, wearing right CAM-boot, limited due to fatigue and increased pain right foot  Stairs            Wheelchair Mobility    Modified Rankin (Stroke Patients Only)       Balance Overall balance assessment: Needs assistance Sitting-balance support: Feet supported;No upper extremity supported Sitting balance-Leahy Scale: Good     Standing balance support: During functional activity;Bilateral upper extremity supported Standing balance-Leahy Scale: Fair Standing balance comment: using RW, wearing right CAM-boot                             Pertinent Vitals/Pain Pain Assessment: 0-10 Pain Score: 8  Pain Location: right foot when weightbearing Pain Descriptors / Indicators: Sore Pain Intervention(s): Limited activity within patient's tolerance;Monitored during session    Home Living Family/patient expects to be discharged to:: Private residence Living Arrangements: Spouse/significant other Available Help at Discharge: Family Type of Home: Mobile home Home Access: Stairs to enter  Entrance Stairs-Rails: None Entrance Stairs-Number of Steps: 2 Home Layout: One level Home Equipment: Walker - 2 wheels;Cane - single point;Shower seat;Bedside  commode      Prior Function Level of Independence: Independent         Comments: community ambulator, drives     Hand Dominance   Dominant Hand: Right    Extremity/Trunk Assessment   Upper Extremity Assessment Upper Extremity Assessment: Overall WFL for tasks assessed    Lower Extremity Assessment Lower Extremity Assessment: Generalized weakness;RLE deficits/detail RLE Deficits / Details: grossly 4/5 RLE: Unable to fully assess due to pain    Cervical / Trunk Assessment Cervical / Trunk Assessment: Normal  Communication   Communication: No difficulties  Cognition Arousal/Alertness: Awake/alert Behavior During Therapy: WFL for tasks assessed/performed Overall Cognitive Status: Within Functional Limits for tasks assessed                                        General Comments      Exercises     Assessment/Plan    PT Assessment Patient needs continued PT services  PT Problem List Decreased strength;Decreased activity tolerance;Decreased balance;Decreased mobility       PT Treatment Interventions Gait training;Stair training;Functional mobility training;Therapeutic activities;Therapeutic exercise;Patient/family education    PT Goals (Current goals can be found in the Care Plan section)  Acute Rehab PT Goals Patient Stated Goal: Return home with family to assist PT Goal Formulation: With patient Time For Goal Achievement: 12/31/18 Potential to Achieve Goals: Good    Frequency Min 3X/week   Barriers to discharge        Co-evaluation               AM-PAC PT "6 Clicks" Mobility  Outcome Measure Help needed turning from your back to your side while in a flat bed without using bedrails?: None Help needed moving from lying on your back to sitting on the side of a flat bed without using bedrails?: None Help needed moving to and from a bed to a chair (including a wheelchair)?: A Little Help needed standing up from a chair using your  arms (e.g., wheelchair or bedside chair)?: A Little Help needed to walk in hospital room?: A Little Help needed climbing 3-5 steps with a railing? : A Lot 6 Click Score: 19    End of Session   Activity Tolerance: Patient tolerated treatment well Patient left: in chair;with call bell/phone within reach;with chair alarm set Nurse Communication: Mobility status PT Visit Diagnosis: Unsteadiness on feet (R26.81);Other abnormalities of gait and mobility (R26.89);Muscle weakness (generalized) (M62.81)    Time: 8828-0034 PT Time Calculation (min) (ACUTE ONLY): 23 min   Charges:   PT Evaluation $PT Eval Moderate Complexity: 1 Mod PT Treatments $Therapeutic Activity: 23-37 mins        3:36 PM, 12/28/18 Lonell Grandchild, MPT Physical Therapist with [facility] [phone] office [phone] mobile phone

## 2018-12-29 DIAGNOSIS — R7309 Other abnormal glucose: Secondary | ICD-10-CM

## 2018-12-29 DIAGNOSIS — E876 Hypokalemia: Secondary | ICD-10-CM

## 2018-12-29 DIAGNOSIS — S8254XA Nondisplaced fracture of medial malleolus of right tibia, initial encounter for closed fracture: Secondary | ICD-10-CM

## 2018-12-29 LAB — GLUCOSE, CAPILLARY
Glucose-Capillary: 137 mg/dL — ABNORMAL HIGH (ref 70–99)
Glucose-Capillary: 78 mg/dL (ref 70–99)

## 2018-12-29 MED ORDER — HYDROCODONE-ACETAMINOPHEN 5-325 MG PO TABS: 1.0000 | ORAL_TABLET | Freq: Three times a day (TID) | ORAL | 0 refills | Status: AC | PRN

## 2018-12-29 MED ORDER — LEVETIRACETAM 500 MG PO TABS: 500.0000 mg | ORAL_TABLET | Freq: Two times a day (BID) | ORAL | 1 refills | Status: DC

## 2018-12-29 NOTE — Progress Notes (Signed)
Telemetry, IV removed. D/C instructions reviewed with patient, verbalized understanding. Patient getting dressed independently. To be transported to private vehicle via wheelchair.

## 2018-12-29 NOTE — Consult Note (Signed)
Patient ID: Theresa Pace, female   DOB: 1960/08/03, 58 y.o.   MRN: [id number] Consult in hospital [place] Requested by Barton Dubois Reason for consultation frx medial malleolus   I recommend wbat in cam walker and ortho f/u 3 weeks  based on the history and physical below  Chief Complaint  Patient presents with  . Altered Mental Status    HPI Theresa Pace is a 58 y.o. female.  Who has an ankle fracture from a fall. Location-right ankle pain medial and lateral side  Duration 2 days doi 8/6 Severity she says I m in severe pain Quality dull ache  Modified by: worse with movement and weight bearing    Review of Systems (at least 2) Review of Systems  Constitutional: Negative for fever.  Respiratory: Negative.   Cardiovascular: Negative.     1. loc 2. Depression    Past Medical History:  Diagnosis Date  . Arthritis   . Asthma   . COPD (chronic obstructive pulmonary disease) ([place])   . Depression   . Fibromyalgia   . Frequent urination at night   . GERD (gastroesophageal reflux disease)   . Hypercholesteremia   . IBS (irritable bowel syndrome)   . Tachycardia    treated with Cardizem  . Tendonitis     Past Surgical History:  Procedure Laterality Date  . ABDOMINAL HYSTERECTOMY    . APPENDECTOMY    . KNEE ARTHROPLASTY Right 05/23/2017    Social History   Tobacco Use  . Smoking status: Former Smoker    Packs/day: 0.25    Types: Cigarettes  . Smokeless tobacco: Never Used  Substance Use Topics  . Alcohol use: No  . Drug use: No    Allergies  Allergen Reactions  . Methadone Hives  . Aspirin Other (See Comments)    Stomach Pain    Current Facility-Administered Medications  Medication Dose Route Frequency Provider Last Rate Last Dose  . 0.9 %  sodium chloride infusion  75 mL/hr Intravenous Continuous Zierle-Ghosh, Asia B, DO   Stopped at 12/28/18 1655  . albuterol (PROVENTIL) (2.5 MG/3ML) 0.083% nebulizer solution 2.5 mg  2.5 mg  Nebulization Q6H PRN Barton Dubois, MD      . bisacodyl (DULCOLAX) suppository 10 mg  10 mg Rectal Daily PRN Zierle-Ghosh, Asia B, DO      . budesonide (PULMICORT) nebulizer solution 0.5 mg  0.5 mg Nebulization Daily Zierle-Ghosh, Asia B, DO   0.5 mg at 12/29/18 0930  . dicyclomine (BENTYL) capsule 10 mg  10 mg Oral QID PRN Zierle-Ghosh, Asia B, DO      . diltiazem (CARDIZEM CD) 24 hr capsule 120 mg  120 mg Oral Daily Barton Dubois, MD   120 mg at 12/29/18 0842  . enoxaparin (LOVENOX) injection 40 mg  40 mg Subcutaneous Q24H Zierle-Ghosh, Asia B, DO   40 mg at 12/29/18 0541  . HYDROcodone-acetaminophen (NORCO/VICODIN) 5-325 MG per tablet 1-2 tablet  1-2 tablet Oral Q8H PRN Barton Dubois, MD   2 tablet at 12/29/18 0541  . insulin aspart (novoLOG) injection 0-9 Units  0-9 Units Subcutaneous TID WC Zierle-Ghosh, Asia B, DO   1 Units at 12/29/18 0843  . levETIRAcetam (KEPPRA) tablet 500 mg  500 mg Oral BID Barton Dubois, MD   500 mg at 12/29/18 0841  . metoCLOPramide (REGLAN) injection 10 mg  10 mg Intravenous Q6H PRN Zierle-Ghosh, Asia B, DO      . polyethylene glycol (MIRALAX / GLYCOLAX) packet 17 g  17  g Oral Daily PRN Zierle-Ghosh, Asia B, DO      . risperiDONE (RISPERDAL) tablet 0.5 mg  0.5 mg Oral BID Zierle-Ghosh, Asia B, DO   0.5 mg at 12/29/18 0842  . rosuvastatin (CRESTOR) tablet 20 mg  20 mg Oral Daily Zierle-Ghosh, Asia B, DO   20 mg at 12/29/18 0841  . senna (SENOKOT) tablet 8.6 mg  1 tablet Oral BID Zierle-Ghosh, Asia B, DO   8.6 mg at 12/29/18 6063      Physical Exam-Detailed Physical Exam  Blood pressure 131/72, pulse 95, temperature 98.7 F (37.1 C), temperature source Oral, resp. rate 18, height 5\' 2"  (1.575 m), weight 91.3 kg, SpO2 95 %. Gen. appearance normal  Cardiovascular exam the pulses are 2+ with no peripheral edema  The ambulatory status is walks with pain   Extremity examined right ankle  Inspection tender medial and lateral side Range of motion assessment 10  degrees rom  Stability assessment: stability test deferrrment  Muscle strength and muscle tone are normal with no atrophy or tremors Skin there ecchymosis medial and lateral side   Sensation to touch is normal The patient is oriented to person place and time The patient's mood and affect show no depression or anxiety or agitation  For comparison the opposite extremity left lower:  was examined and the following findings were noted  Normal alignment and range of motion in the ankle with normal muscle tone and normal stability  MEDICAL DECISION MAKING (minimum/low)  Data Reviewed  I have personally reviewed the imaging studies and the report and my interpretation is:  Min displaced medial mall frx right ankle with intact ankle mortise   Assessment and Plan  Fracture medial malleolus right ankle. Minimal displacement   wbat   Fu in 3 weeks     Arther Abbott 12/29/2018, 12:11 PM   Carole Civil MD

## 2018-12-29 NOTE — TOC Transition Note (Addendum)
Transition of Care [facility]) - CM/SW Discharge Note   Patient Details  Name: Theresa Pace MRN: [id number] Date of Birth: [birth year]  Transition of Care [facility]) CM/SW Contact:  Latanya Maudlin, RN Phone Number: 12/29/2018, 12:30 PM   Clinical Narrative:   Patient to be discharged per MD order. Orders in place for home health services. Previous RNCM began workup for home health via [facility]. Notified Corene Cornea of discharge. Family to transport . Patient in need of walker. Ordered from [employer]. Will plan for home delivery.    Final next level of care: [place] Barriers to Discharge: No Barriers Identified   Patient Goals and CMS Choice Patient states their goals for this hospitalization and ongoing recovery are:: return home CMS Medicare.gov Compare Post Acute Care list provided to:: Patient Choice offered to / list presented to : Patient  Discharge Placement                       Discharge Plan and Services     Post Acute Care Choice: [place]: PT [facility] Agency: [place] ([place]) Date [place]: 12/29/18 Time [place]: 8280 Representative spoke with at [place]: [place] ([place]) Interventions     Readmission Risk Interventions Readmission Risk Prevention Plan 12/29/2018  Transportation Screening Complete  PCP or Specialist Appt within 5-7 Days Complete  Home Care Screening Complete  Medication Review (RN CM) Complete  Some recent data might be hidden

## 2018-12-29 NOTE — Discharge Summary (Signed)
Physician Discharge Summary  VERNICE MANNINA WNU:[id number] DOB: 16-Apr-1961 DOA: 12/25/2018  PCP: Caryl Bis, MD  Admit date: 12/25/2018 Discharge date: 12/29/2018  Time spent: 35 minutes  Recommendations for Outpatient Follow-up:  1. Repeat basic metabolic panel to follow lites renal function 2. Reassess blood pressure and further adjust antihypertensive regimen as needed 3. Close follow-up the patient CBGs and A1c as she is currently in prediabetes range.   Discharge Diagnoses:  Active Problems:   Acute encephalopathy   Acute metabolic encephalopathy Right medial malleolus fracture Seizure-like activity Hypertension Chronic pain syndrome Prediabetes Depression  Discharge Condition: Stable and improved.  Patient discharged home with home health services and instruction to follow-up with PCP, neurology and orthopedic service.  Diet recommendation: Heart healthy and modified carbohydrate diet.  Filed Weights   12/26/18 2248  Weight: 91.3 kg    History of present illness:  As per H&P written by Dr. Myrene Buddy on 12/26/18 58 y.o. female, with history of COPD, depression, fibromyalgia, GERD, hypercholesterolemia, who presents to the ED today via EMS.  EMS was apparently called to the house due to suspected allergic reaction.  Patient then had seizure-like activity in front of them and became combative.  Patient was given 5 of Versed in route to the ED.  After the 5 Versed patient became hypoxic and required oxygen supplementation with a 2 L nasal cannula.  Patient has not returned to baseline mentation in the 2-1/2 hours that she has been in the ER.  Unfortunately she cannot give [place] any history.  EMS reports that they tried to obtain history from husband, and it was ultimately unfruitful.   ED course  Lab work revealed a hypokalemia at 3.3.  And hyperglycemia at 215.  Lactic acid was 1.1, CK was 61.  There is a leukocytosis at 15.9.  Urine was not suspicious for UTI as white blood  cells were 0-5.  There was moderate glucose in the urine and hyaline casts.  CT head showed no acute intracranial abnormality.  Chest x-ray showed no active disease.  Tox screen was positive for amphetamines benzos opiates and THC.  Patient apparently is prescribed Adderall, opiates for pain, she was given benzo in the ambulance, so the only unexplained result is the [facility].  Since this could potentially be patient's second seizure she was loaded with Keppra in the ED.  Chart review reveals an admission December 22, 2011 for possible seizure.  At that time she had been found by her husband in the night shaking.  She was monitored in the ED, and determined to have postictal state as her confusion persisted for many hours, so she was admitted to the hospital for further evaluation.  At that time CT and MRI of brain were unremarkable.  EEG was done which did not show epileptiform changes.  She was seen by neurology and anticonvulsants were not recommended at that point.  Patient felt as though Robaxin may have been the precipitating agent at that point.  Of note patient is scheduled for colonoscopy with Dr. Gala Romney August 6.  Hospital Course:  1-acute metabolic encephalopathy -Negative CT and MRI -EEG no revealing of epileptic waveforms activity -oriented X 3 currently and following commands appropriately; no focal neurologic deficit. -Appreciate neurology consultation and recommending agents; following recommendations patient will be discharged on Keppra 500 mg twice a day.  Outpatient follow-up in 4-6 weeks with Dr. Merlene Laughter as an outpatient. -TSH and B12 within normal limits.  2-dysphasia -On presentation due to altered mentation. -Evaluated by  speech therapy and recommendations given for regular diet with thin liquids. -No difficulty swallowing at time of discharge.  3-prediabetes -A1c repeated at 6.1 -Continue modified carbohydrate diet and close outpatient  follow-up.  4-hyperlipidemia -Continue Crestor  5-hypertension -Continue Cardizem. -Patient advised to follow heart healthy diet.  6-depression with psychotic features -Continue Risperdal -No hallucinations and a stable mood currently. -Continue outpatient follow-up with PCP/psychiatry service.  7-history of COPD -Continue Pulmicort and as needed albuterol. -No wheezing on exam.  8-right ankle fracture -X-ray demonstrating mildly displaced right malleolus fracture -Continue pain medication -Appreciate recommendations by orthopedic service -Weightbearing as tolerated while using CAM Walker. -Follow-up with orthopedic service in 3 weeks.  9-acute respiratory failure with hypoxia -Appears to be secondary to the use of sedative medication to break up seizure-like activity -Denies shortness of breath and currently no requiring oxygen supplementation. -Good O2 sat on room air. -Prior history of COPD but not requiring oxygen supplementation prior to this admission.  10-chronic pain syndrome -Continue outpatient follow-up with pain clinic -In the setting of acute fracture and the fact that patient lost appointment with pain clinic on Thursday 12/27/18 due to hospitalization, limited prescription of pain medication has been given to assist with pain management until follow-up with clinic.  Procedures:  See below for x-ray reports  EEG: No epileptic waves appreciated  Consultations:  Neurology service  Orthopedic service  Discharge Exam: Vitals:   12/29/18 0930 12/29/18 0958  BP:  131/72  Pulse:  95  Resp:  18  Temp:  98.7 F (37.1 C)  SpO2: 97% 95%    General: Afebrile, no chest pain, no shortness of breath, no nausea, no vomiting.  Patient oriented x3.  Still reporting pain in her right foot. Cardiovascular: S1 and S2; no rubs, no gallops Respiratory: Good air movement bilaterally, no using accessory muscles.  Normal respiratory effort.  Good oxygen saturation  on room air. Abdomen: Soft, nontender, nondistended, positive bowel sounds. Extremities: No cyanosis or clubbing.  Decreased range of motion in her right foot and reported tenderness to palpation; cam walker in place.  Discharge Instructions   Discharge Instructions    Diet - low sodium heart healthy   Complete by: As directed    Discharge instructions   Complete by: As directed    Take medications as prescribed Arrange follow-up with PCP in 10 days Follow-up with orthopedic service in 2-3 weeks (contact office for appointment details) Follow-up with neurology service in 4-6 weeks (contact office for appointment details).   Increase activity slowly   Complete by: As directed      Allergies as of 12/29/2018      Reactions   Methadone Hives   Aspirin Other (See Comments)   Stomach Pain      Medication List    TAKE these medications   albuterol 108 (90 Base) MCG/ACT inhaler Commonly known as: VENTOLIN HFA Inhale 2 puffs into the lungs every 6 (six) hours as needed for wheezing or shortness of breath.   amphetamine-dextroamphetamine 30 MG tablet Commonly known as: ADDERALL Take 30 mg by mouth 3 (three) times daily.   aspirin EC 81 MG tablet Take 81 mg by mouth daily.   baclofen 10 MG tablet Commonly known as: LIORESAL Take 10 mg by mouth 2 (two) times daily.   budesonide 0.5 MG/2ML nebulizer solution Commonly known as: Pulmicort Take 2 mLs (0.5 mg total) by nebulization daily. What changed:   when to take this  reasons to take this   dicyclomine 10 MG  capsule Commonly known as: BENTYL Take 1 capsule (10 mg total) by mouth 4 (four) times daily as needed for spasms (or diarrhea).   diltiazem 120 MG 24 hr capsule Commonly known as: DILACOR XR Take 120 mg by mouth daily.   HYDROcodone-acetaminophen 5-325 MG tablet Commonly known as: NORCO/VICODIN Take 1 tablet by mouth every 8 (eight) hours as needed for up to 5 days for moderate pain or severe pain. What  changed: reasons to take this   ipratropium-albuterol 0.5-2.5 (3) MG/3ML Soln Commonly known as: DUONEB Take 3 mLs by nebulization 4 (four) times daily. What changed: when to take this   levETIRAcetam 500 MG tablet Commonly known as: KEPPRA Take 1 tablet (500 mg total) by mouth 2 (two) times daily.   loperamide 2 MG tablet Commonly known as: IMODIUM A-D Take 8 mg by mouth daily as needed for diarrhea or loose stools.   pantoprazole 40 MG tablet Commonly known as: PROTONIX Take 1 tablet (40 mg total) by mouth 2 (two) times daily before a meal.   risperiDONE 0.5 MG tablet Commonly known as: RISPERDAL Take 0.5 mg by mouth 2 (two) times daily.   rosuvastatin 20 MG tablet Commonly known as: CRESTOR Take 20 mg by mouth daily.   solifenacin 5 MG tablet Commonly known as: VESICARE Take 5 mg by mouth daily.   traZODone 50 MG tablet Commonly known as: DESYREL Take 150 mg by mouth at bedtime.   Vitamin D (Ergocalciferol) 1.25 MG (50000 UT) Caps capsule Commonly known as: DRISDOL Take 50,000 Units by mouth every 7 (seven) days.            Durable Medical Equipment  (From admission, onward)         Start     Ordered   12/29/18 1145  For home use only DME Walker rolling  Once    Question:  Patient needs a walker to treat with the following condition  Answer:  Ankle fracture, lateral malleolus, closed   12/29/18 1145         Allergies  Allergen Reactions  . Methadone Hives  . Aspirin Other (See Comments)    Stomach Pain   Follow-up Information    Caryl Bis, MD. Schedule an appointment as soon as possible for a visit in 10 day(s).   Specialty: Family Medicine Contact information: [place] [address] [phone]        Carole Civil, MD. Schedule an appointment as soon as possible for a visit today.   Specialties: Orthopedic Surgery, Radiology Why: Contact office for appointment details in 2-3 weeks. Contact information: [address] [place] [address] [phone]        Phillips Odor, MD. Schedule an appointment as soon as possible for a visit.   Specialty: Neurology Why: Contact office for appointment detail in 4-6 weeks. Contact information: [address] [place] [address] [phone]           The results of significant diagnostics from this hospitalization (including imaging, microbiology, ancillary and laboratory) are listed below for reference.    Significant Diagnostic Studies: Dg Ankle Complete Right  Result Date: 12/27/2018 CLINICAL DATA:  Pain swelling and redness at medial aspect of RIGHT foot/ankle after falling 2 days ago EXAM: RIGHT ANKLE - COMPLETE 3+ VIEW COMPARISON:  07/19/2018 FINDINGS: Diffuse soft tissue swelling, greatest medially. Osseous demineralization. Joint spaces preserved. Transverse mildly displaced fracture of the medial malleolus. No additional fracture, dislocation, or bone destruction. IMPRESSION: Mildly  displaced transverse fracture of the RIGHT medial malleolus. Electronically Signed   By: Lavonia Dana M.D.   On: 12/27/2018 15:06   Ct Head Wo Contrast  Result Date: 12/26/2018 CLINICAL DATA:  Altered mental status EXAM: CT HEAD WITHOUT CONTRAST TECHNIQUE: Contiguous axial images were obtained from the base of the skull through the vertex without intravenous contrast. COMPARISON:  02/21/2012 FINDINGS: Brain: No evidence of acute infarction, hemorrhage, hydrocephalus, extra-axial collection or mass lesion/mass effect. Vascular: No hyperdense vessel or unexpected calcification. Skull: Normal. Negative for fracture or focal lesion. Sinuses/Orbits: No acute finding. Other: None. IMPRESSION: No acute intracranial abnormality noted. Electronically Signed   By: Inez Catalina M.D.   On: 12/26/2018 01:49   Mr Brain Wo Contrast  Result Date: 12/26/2018 CLINICAL DATA:  New seizure.  Altered mental status. EXAM: MRI HEAD WITHOUT CONTRAST TECHNIQUE: Multiplanar,  multiecho pulse sequences of the brain and surrounding structures were obtained without intravenous contrast. COMPARISON:  Head CT same day.  MRI 02/21/2012. FINDINGS: Complete exam not performed because of patient motion and condition. Brain: Diffusion imaging does not show any acute or subacute infarction. The brainstem and cerebellum are normal. Cerebral hemispheres show mild volume loss without evidence of old or acute infarction. No mass lesion, hemorrhage, hydrocephalus or extra-axial collection. No temporal lobe lesion is seen, though coronal imaging was not performed. Vascular: Major vessels at the base of the brain show flow. Skull and upper cervical spine: Negative Sinuses/Orbits: Clear/normal Other: None IMPRESSION: No acute or focal finding. Generalized atrophy. Complete exam not performed because of patient condition. No abnormality seen to explain seizure. Electronically Signed   By: Nelson Chimes M.D.   On: 12/26/2018 08:43   Dg Chest Port 1 View  Result Date: 12/26/2018 CLINICAL DATA:  Altered mental status EXAM: PORTABLE CHEST 1 VIEW COMPARISON:  06/27/2016 FINDINGS: Heart is upper limits normal in size. Lungs clear. No effusions or edema. No acute bony abnormality. IMPRESSION: No active disease. Electronically Signed   By: Rolm Baptise M.D.   On: 12/26/2018 00:38   Dg Foot Complete Right  Result Date: 12/27/2018 CLINICAL DATA:  Pain and swelling medial foot.  Fall 2 days ago EXAM: RIGHT FOOT COMPLETE - 3+ VIEW COMPARISON:  07/19/2018 FINDINGS: Transverse fracture of the medial malleolus appears acute. Recommend ankle x-rays. Negative for foot fracture. Lateral subluxation of the fifth PIP joint with degenerative changes chronic and unchanged from the prior study. IMPRESSION: Fracture medial malleolus.  Recommend right ankle x-ray series. Electronically Signed   By: Franchot Gallo M.D.   On: 12/27/2018 11:59    Microbiology: Recent Results (from the past 240 hour(s))  SARS CORONAVIRUS 2  Nasal Swab Aptima Multi Swab     Status: None   Collection Time: 12/24/18  7:00 AM   Specimen: Aptima Multi Swab; Nasal Swab  Result Value Ref Range Status   SARS Coronavirus 2 NEGATIVE NEGATIVE Final    Comment: (NOTE) SARS-CoV-2 target nucleic acids are NOT DETECTED. The SARS-CoV-2 RNA is generally detectable in upper and lower respiratory specimens during the acute phase of infection. Negative results do not preclude SARS-CoV-2 infection, do not rule out co-infections with other pathogens, and should not be used as the sole basis for treatment or other patient management decisions. Negative results must be combined with clinical observations, patient history, and epidemiological information. The expected result is Negative. Fact Sheet for Patients: SugarRoll.be Fact Sheet for Healthcare Providers: https://www.woods-mathews.com/ This test is not yet approved or cleared by the [place] FDA and  has been authorized for detection and/or diagnosis of SARS-CoV-2 by FDA under an Emergency Use Authorization (EUA). This EUA will remain  in effect (meaning this test can be used) for the duration of the COVID-19 declaration under Section 56 4(b)(1) of the Act, 21 U.S.C. section 360bbb-3(b)(1), unless the authorization is terminated or revoked sooner. Performed at [facility], [address], [place], [place] [address]   SARS Coronavirus 2 [facility] order, Performed in [facility] hospital lab) Nasopharyngeal Nasopharyngeal Swab     Status: None   Collection Time: 12/26/18 12:10 AM   Specimen: Nasopharyngeal Swab  Result Value Ref Range Status   SARS Coronavirus 2 NEGATIVE NEGATIVE Final    Comment: (NOTE) If result is NEGATIVE SARS-CoV-2 target nucleic acids are NOT DETECTED. The SARS-CoV-2 RNA is generally detectable in upper and lower  respiratory specimens during the acute phase of infection. The lowest  concentration of  SARS-CoV-2 viral copies this assay can detect is 250  copies / mL. A negative result does not preclude SARS-CoV-2 infection  and should not be used as the sole basis for treatment or other  patient management decisions.  A negative result may occur with  improper specimen collection / handling, submission of specimen other  than nasopharyngeal swab, presence of viral mutation(s) within the  areas targeted by this assay, and inadequate number of viral copies  (<250 copies / mL). A negative result must be combined with clinical  observations, patient history, and epidemiological information. If result is POSITIVE SARS-CoV-2 target nucleic acids are DETECTED. The SARS-CoV-2 RNA is generally detectable in upper and lower  respiratory specimens dur ing the acute phase of infection.  Positive  results are indicative of active infection with SARS-CoV-2.  Clinical  correlation with patient history and other diagnostic information is  necessary to determine patient infection status.  Positive results do  not rule out bacterial infection or co-infection with other viruses. If result is PRESUMPTIVE POSTIVE SARS-CoV-2 nucleic acids MAY BE PRESENT.   A presumptive positive result was obtained on the submitted specimen  and confirmed on repeat testing.  While 2019 novel coronavirus  (SARS-CoV-2) nucleic acids may be present in the submitted sample  additional confirmatory testing may be necessary for epidemiological  and / or clinical management purposes  to differentiate between  SARS-CoV-2 and other Sarbecovirus currently known to infect humans.  If clinically indicated additional testing with an alternate test  methodology [phone]) is advised. The SARS-CoV-2 RNA is generally  detectable in upper and lower respiratory sp ecimens during the acute  phase of infection. The expected result is Negative. Fact Sheet for Patients:  StrictlyIdeas.no Fact Sheet for Healthcare  Providers: BankingDealers.co.za This test is not yet approved or cleared by the [place] FDA and has been authorized for detection and/or diagnosis of SARS-CoV-2 by FDA under an Emergency Use Authorization (EUA).  This EUA will remain in effect (meaning this test can be used) for the duration of the COVID-19 declaration under Section 564(b)(1) of the Act, 21 U.S.C. section 360bbb-3(b)(1), unless the authorization is terminated or revoked sooner. Performed at [facility], [address] [place], [place] [address]      Labs: Basic Metabolic Panel: Recent Labs  Lab 12/26/18 0014 12/27/18 0422  NA 138 141  K 3.3* 4.1  CL 105 108  CO2 21* 27  GLUCOSE 215* 110*  BUN 16 11  CREATININE 1.08* 0.83  CALCIUM 8.9 8.8*  MG 2.2  --    Liver Function Tests:  Recent Labs  Lab 12/26/18 0014  AST 12*  ALT 16  ALKPHOS 80  BILITOT 0.8  PROT 6.9  ALBUMIN 4.0    Recent Labs  Lab 12/26/18 0418  AMMONIA 26   CBC: Recent Labs  Lab 12/26/18 0014 12/26/18 0418 12/27/18 0422  WBC 15.9*  --  12.5*  NEUTROABS 12.6*  --   --   HGB 15.5*  --  14.9  HCT 47.8* 44.5 46.6*  MCV 92.3  --  94.1  PLT 291  --  306   Cardiac Enzymes: Recent Labs  Lab 12/26/18 0014  CKTOTAL 61   CBG: Recent Labs  Lab 12/28/18 1133 12/28/18 1611 12/28/18 2149 12/29/18 0750 12/29/18 1050  GLUCAP 80 101* 107* 137* 78    Signed:  Barton Dubois MD.  [facility] 12/29/2018, 11:48 AM

## 2019-01-08 ENCOUNTER — Telehealth: Payer: Self-pay | Admitting: Orthopedic Surgery

## 2019-01-08 NOTE — Telephone Encounter (Signed)
(  Cont'd) Please advise if medication can be prescribed for pain - [place]. Patient is scheduled for hospital follow up visit with Dr Aline Brochure 01/21/19 (aware that Dr Aline Brochure is out of clinic for a week). Patient has also been attempting to contact her pain clinic regarding medication.

## 2019-01-08 NOTE — Telephone Encounter (Signed)
Call received from nurse from [place]; states with patient this afternoon. Relays patient told her she is completely out of medication for pain. States patient said she was given something while in hospital. Nurse relays she does see a provider at a pain clinic, and has been unable to get through. Please advise if any medication can be prescribed for pain.

## 2019-01-09 NOTE — Telephone Encounter (Signed)
Must get thru pain clinic

## 2019-01-10 ENCOUNTER — Ambulatory Visit: Payer: Medicaid Other | Admitting: Nurse Practitioner

## 2019-01-10 NOTE — Telephone Encounter (Signed)
Called patient to notify per Dr Ruthe Mannan response; patient said she has "already got that straight with pain clinic." Thanked [place] for calling back.

## 2019-01-10 NOTE — Progress Notes (Deleted)
Referring Provider: Caryl Bis, MD Primary Care Physician:  Caryl Bis, MD Primary GI:  Dr. Gala Romney  No chief complaint on file.   HPI:   Theresa Pace is a 58 y.o. female who presents for follow-up on abdominal pain and diarrhea.  The patient was last seen in our office 10/25/2018 for IBS diarrhea type and bleeding hemorrhoids.  The patient's last visit was a virtual office visit due to coronavirus/COVID-19 pandemic.  Noted history of chronic GERD and chronic IBS (diagnosed in early 42s).  Historically has significant abdominal pain that triggers a bowel movement and has 5-6 stools a day which are 80% diarrhea and 20% [place] 5.  Also with intermittent rectal discomfort, fecal incontinence, "accidents."  Colonoscopy in 2016 found 2 sessile polyps ranging from 3 to 7 mm in size and internal hemorrhoids.  Recommended 5-year repeat.  Colonoscopy was scheduled for 07/23/2018 but this was rescheduled due to the pandemic.  Last office visit and she was still having hemorrhoid symptoms that were significant with total tissue hematochezia and rectal pain.  Previously tried Anusol.  Multiple bowel movements a day with IBS diarrhea type.  She did not check stool studies as previously recommended and notes she will do this ASAP.  Still with gallbladder, no history of pancreatitis.  Abdominal pain is the same and generally described as crampy, no improvement after bowel movement and can be as bad as a 9/10.  No other GI symptoms.  Recommended complete stool studies, prescription for rectal cream with steroid and lidocaine to [facility], colonoscopy, consider hemorrhoid banding in the future.  Also recommended CT of the abdomen and pelvis due to change in abdominal pain and significant family history of multiple cancers.  She still has not completed her stool studies.  She did not complete her CT of the abdomen and pelvis as requested.  Several days after her procedure her primary care called to  request an upper endoscopy be added to her colonoscopy for dysphasia, despite that this was not mentioned by the patient on the day of her exam.  Regardless, the patient called the day of her colonoscopy stating she was not sure if there is something she was supposed to drink prior to her procedure.  It was discovered she never picked up her bowel prep but simply took 2 Dulcolax.  Her procedure was rescheduled to 12/27/2018.  Procedure was canceled because the patient was in the emergency room for altered mental status and seizure activity.  Today she states   Past Medical History:  Diagnosis Date  . Arthritis   . Asthma   . COPD (chronic obstructive pulmonary disease) ([place])   . Depression   . Fibromyalgia   . Frequent urination at night   . GERD (gastroesophageal reflux disease)   . Hypercholesteremia   . IBS (irritable bowel syndrome)   . Tachycardia    treated with Cardizem  . Tendonitis     Past Surgical History:  Procedure Laterality Date  . ABDOMINAL HYSTERECTOMY    . APPENDECTOMY    . KNEE ARTHROPLASTY Right 05/23/2017    Current Outpatient Medications  Medication Sig Dispense Refill  . albuterol (PROVENTIL HFA;VENTOLIN HFA) 108 (90 BASE) MCG/ACT inhaler Inhale 2 puffs into the lungs every 6 (six) hours as needed for wheezing or shortness of breath.     . amphetamine-dextroamphetamine (ADDERALL) 30 MG tablet Take 30 mg by mouth 3 (three) times daily.    Marland Kitchen aspirin EC 81 MG tablet Take  81 mg by mouth daily.    . baclofen (LIORESAL) 10 MG tablet Take 10 mg by mouth 2 (two) times daily.    . budesonide (PULMICORT) 0.5 MG/2ML nebulizer solution Take 2 mLs (0.5 mg total) by nebulization daily. (Patient taking differently: Take 0.5 mg by nebulization 4 (four) times daily as needed (for shortness of breath or wheezing). ) 50 mL 0  . dicyclomine (BENTYL) 10 MG capsule Take 1 capsule (10 mg total) by mouth 4 (four) times daily as needed for spasms (or diarrhea). 90 capsule 2  .  diltiazem (DILACOR XR) 120 MG 24 hr capsule Take 120 mg by mouth daily.    Marland Kitchen ipratropium-albuterol (DUONEB) 0.5-2.5 (3) MG/3ML SOLN Take 3 mLs by nebulization 4 (four) times daily. (Patient taking differently: Take 3 mLs by nebulization 3 (three) times daily. )    . levETIRAcetam (KEPPRA) 500 MG tablet Take 1 tablet (500 mg total) by mouth 2 (two) times daily. 60 tablet 1  . loperamide (IMODIUM A-D) 2 MG tablet Take 8 mg by mouth daily as needed for diarrhea or loose stools.    . pantoprazole (PROTONIX) 40 MG tablet Take 1 tablet (40 mg total) by mouth 2 (two) times daily before a meal. 60 tablet 3  . risperiDONE (RISPERDAL) 0.5 MG tablet Take 0.5 mg by mouth 2 (two) times daily.    . rosuvastatin (CRESTOR) 20 MG tablet Take 20 mg by mouth daily.    . solifenacin (VESICARE) 5 MG tablet Take 5 mg by mouth daily.    . traZODone (DESYREL) 50 MG tablet Take 150 mg by mouth at bedtime.     . Vitamin D, Ergocalciferol, (DRISDOL) 1.25 MG (50000 UT) CAPS capsule Take 50,000 Units by mouth every 7 (seven) days.     No current facility-administered medications for this visit.     Allergies as of 01/10/2019 - Review Complete 12/26/2018  Allergen Reaction Noted  . Methadone Hives 11/21/2012  . Aspirin Other (See Comments) 02/21/2012    Family History  Problem Relation Age of Onset  . Colon cancer Neg Hx     Social History   Socioeconomic History  . Marital status: Married    Spouse name: Not on file  . Number of children: Not on file  . Years of education: Not on file  . Highest education level: Not on file  Occupational History  . Not on file  Social Needs  . Financial resource strain: Not on file  . Food insecurity    Worry: Not on file    Inability: Not on file  . Transportation needs    Medical: Not on file    Non-medical: Not on file  Tobacco Use  . Smoking status: Former Smoker    Packs/day: 0.25    Types: Cigarettes  . Smokeless tobacco: Never Used  Substance and Sexual  Activity  . Alcohol use: No  . Drug use: No  . Sexual activity: Yes    Birth control/protection: Surgical  Lifestyle  . Physical activity    Days per week: Not on file    Minutes per session: Not on file  . Stress: Not on file  Relationships  . Social Herbalist on phone: Not on file    Gets together: Not on file    Attends religious service: Not on file    Active member of club or organization: Not on file    Attends meetings of clubs or organizations: Not on file  Relationship status: Not on file  Other Topics Concern  . Not on file  Social History Narrative  . Not on file    Review of Systems: General: Negative for anorexia, weight loss, fever, chills, fatigue, weakness. Eyes: Negative for vision changes.  ENT: Negative for hoarseness, difficulty swallowing , nasal congestion. CV: Negative for chest pain, angina, palpitations, dyspnea on exertion, peripheral edema.  Respiratory: Negative for dyspnea at rest, dyspnea on exertion, cough, sputum, wheezing.  GI: See history of present illness. GU:  Negative for dysuria, hematuria, urinary incontinence, urinary frequency, nocturnal urination.  MS: Negative for joint pain, low back pain.  Derm: Negative for rash or itching.  Neuro: Negative for weakness, abnormal sensation, seizure, frequent headaches, memory loss, confusion.  Psych: Negative for anxiety, depression, suicidal ideation, hallucinations.  Endo: Negative for unusual weight change.  Heme: Negative for bruising or bleeding. Allergy: Negative for rash or hives.   Physical Exam: There were no vitals taken for this visit. General:   Alert and oriented. Pleasant and cooperative. Well-nourished and well-developed.  Head:  Normocephalic and atraumatic. Eyes:  Without icterus, sclera clear and conjunctiva pink.  Ears:  Normal auditory acuity. Mouth:  No deformity or lesions, oral mucosa pink.  Throat/Neck:  Supple, without mass or thyromegaly.  Cardiovascular:  S1, S2 present without murmurs appreciated. Normal pulses noted. Extremities without clubbing or edema. Respiratory:  Clear to auscultation bilaterally. No wheezes, rales, or rhonchi. No distress.  Gastrointestinal:  +BS, soft, non-tender and non-distended. No HSM noted. No guarding or rebound. No masses appreciated.  Rectal:  Deferred  Musculoskalatal:  Symmetrical without gross deformities. Normal posture. Skin:  Intact without significant lesions or rashes. Neurologic:  Alert and oriented x4;  grossly normal neurologically. Psych:  Alert and cooperative. Normal mood and affect. Heme/Lymph/Immune: No significant cervical adenopathy. No excessive bruising noted.    01/10/2019 7:56 AM   Disclaimer: This note was dictated with voice recognition software. Similar sounding words can inadvertently be transcribed and may not be corrected upon review.

## 2019-01-21 ENCOUNTER — Ambulatory Visit: Payer: Medicaid Other | Admitting: Orthopedic Surgery

## 2019-02-11 ENCOUNTER — Other Ambulatory Visit: Payer: Self-pay

## 2019-02-11 ENCOUNTER — Ambulatory Visit: Payer: Medicaid Other

## 2019-02-11 ENCOUNTER — Ambulatory Visit: Payer: Medicaid Other | Admitting: Orthopedic Surgery

## 2019-02-11 VITALS — BP 132/89 | HR 92 | Temp 97.2°F | Ht 62.0 in | Wt 194.0 lb

## 2019-02-11 DIAGNOSIS — S82891D Other fracture of right lower leg, subsequent encounter for closed fracture with routine healing: Secondary | ICD-10-CM

## 2019-02-11 NOTE — Patient Instructions (Signed)
WEIGHT BEARING AS TOLERATED IN THE BOOT

## 2019-02-11 NOTE — Progress Notes (Signed)
Chief Complaint  Patient presents with  . Follow-up    Recheck on right ankle fracture, DOI 12-25-18.   46 days after medial malleolus fracture treated with CAM Walker.  Patient complains of pain over the medial malleolus with some improvement in the cam walker  X-ray shows a nondisplaced fracture with intact ankle mortise clinical exam shows tenderness over the medial malleolus with normal skin neurovascular exam is intact  Patient is ambulatory in the cam walker  Recommend 4 more weeks in the boot and then repeat the x-ray

## 2019-03-11 ENCOUNTER — Ambulatory Visit: Payer: Medicaid Other | Admitting: Orthopedic Surgery

## 2019-03-13 ENCOUNTER — Ambulatory Visit: Payer: Medicaid Other | Admitting: Orthopedic Surgery

## 2019-03-14 DIAGNOSIS — M1712 Unilateral primary osteoarthritis, left knee: Secondary | ICD-10-CM | POA: Insufficient documentation

## 2019-03-21 DIAGNOSIS — S82891A Other fracture of right lower leg, initial encounter for closed fracture: Secondary | ICD-10-CM | POA: Insufficient documentation

## 2019-03-25 ENCOUNTER — Ambulatory Visit: Payer: Medicaid Other | Admitting: Orthopedic Surgery

## 2019-03-27 ENCOUNTER — Ambulatory Visit: Payer: Medicaid Other

## 2019-03-27 ENCOUNTER — Other Ambulatory Visit: Payer: Self-pay

## 2019-03-27 ENCOUNTER — Encounter: Payer: Self-pay | Admitting: Orthopedic Surgery

## 2019-03-27 ENCOUNTER — Ambulatory Visit (INDEPENDENT_AMBULATORY_CARE_PROVIDER_SITE_OTHER): Payer: Medicaid Other | Admitting: Orthopedic Surgery

## 2019-03-27 DIAGNOSIS — S82891D Other fracture of right lower leg, subsequent encounter for closed fracture with routine healing: Secondary | ICD-10-CM | POA: Diagnosis not present

## 2019-03-27 NOTE — Patient Instructions (Signed)
Thew boot can come off

## 2019-03-27 NOTE — Progress Notes (Signed)
Chief Complaint  Patient presents with  . Ankle Pain    right ankle fracture 12/25/18 improving     58 year old female medial malleolus fracture treated with a cam walker says she is feeling good at this time  Examination reveals no tenderness at the fracture site  X-ray shows fracture healing intact ankle mortise  Recommend removing the boot and resume normal activities  Patient released from care

## 2019-06-07 ENCOUNTER — Ambulatory Visit (INDEPENDENT_AMBULATORY_CARE_PROVIDER_SITE_OTHER): Payer: Medicaid Other | Admitting: Internal Medicine

## 2019-06-07 ENCOUNTER — Encounter: Payer: Self-pay | Admitting: Internal Medicine

## 2019-06-07 ENCOUNTER — Telehealth: Payer: Self-pay | Admitting: Internal Medicine

## 2019-06-07 ENCOUNTER — Other Ambulatory Visit: Payer: Self-pay

## 2019-06-07 VITALS — BP 132/94 | HR 113 | Temp 98.6°F | Ht 62.0 in | Wt 194.6 lb

## 2019-06-07 DIAGNOSIS — I208 Other forms of angina pectoris: Secondary | ICD-10-CM

## 2019-06-07 DIAGNOSIS — R9431 Abnormal electrocardiogram [ECG] [EKG]: Secondary | ICD-10-CM | POA: Diagnosis not present

## 2019-06-07 MED ORDER — METOPROLOL TARTRATE 100 MG PO TABS: ORAL_TABLET | ORAL | 0 refills | Status: DC

## 2019-06-07 NOTE — Telephone Encounter (Signed)
  Precert needed for: Echo   Location: [facility]    Date:  Jun 19, 2019

## 2019-06-07 NOTE — Progress Notes (Signed)
Cardiology Office Note   Date:  06/07/2019   ID:  Theresa Pace, DOB 10-02-1960, MRN [id number]  PCP:  Caryl Bis, MD  Cardiologist:   Dorris Carnes, MD    Pt referred from [facility] for an abnormal EKG   History of Present Illness: Theresa Pace is a 59 y.o. female who is referred for abnormal EKG The pt had EKG in December  that showed LBBB   This was new from previous  The pt says she has occsaional chest pains that are sharp, fleeting  Not associated Pt says her heart races all the time  She say she drinks a lot of fluid but her urine is a darker yellow  Pt says her Actvitiy is limited by knees  Plan for surgery   Noted CT of chest in Feb 2018 showed mild coronary calcifications    Mild atherosclerosis of aorta      Current Meds  Medication Sig  . albuterol (PROVENTIL HFA;VENTOLIN HFA) 108 (90 BASE) MCG/ACT inhaler Inhale 2 puffs into the lungs every 6 (six) hours as needed for wheezing or shortness of breath.   . amphetamine-dextroamphetamine (ADDERALL) 30 MG tablet Take 30 mg by mouth 3 (three) times daily.  Marland Kitchen aspirin EC 81 MG tablet Take 81 mg by mouth daily.  . baclofen (LIORESAL) 10 MG tablet Take 10 mg by mouth 2 (two) times daily.  . budesonide (PULMICORT) 0.5 MG/2ML nebulizer solution Take 2 mLs (0.5 mg total) by nebulization daily. (Patient taking differently: Take 0.5 mg by nebulization 4 (four) times daily as needed (for shortness of breath or wheezing). )  . dicyclomine (BENTYL) 10 MG capsule Take 1 capsule (10 mg total) by mouth 4 (four) times daily as needed for spasms (or diarrhea).  . diltiazem (DILACOR XR) 120 MG 24 hr capsule Take 120 mg by mouth daily.  Marland Kitchen HYDROcodone-acetaminophen (NORCO/VICODIN) 5-325 MG tablet Take 1 tablet by mouth 2 (two) times daily.  Marland Kitchen ipratropium-albuterol (DUONEB) 0.5-2.5 (3) MG/3ML SOLN Take 3 mLs by nebulization 4 (four) times daily. (Patient taking differently: Take 3 mLs by nebulization 3 (three) times daily. )  .  pantoprazole (PROTONIX) 40 MG tablet Take 1 tablet (40 mg total) by mouth 2 (two) times daily before a meal.  . risperiDONE (RISPERDAL) 0.5 MG tablet Take 0.5 mg by mouth 2 (two) times daily.  . rosuvastatin (CRESTOR) 20 MG tablet Take 20 mg by mouth daily.  . solifenacin (VESICARE) 5 MG tablet Take 5 mg by mouth daily.  . traZODone (DESYREL) 50 MG tablet Take 150 mg by mouth at bedtime.   . [DISCONTINUED] levETIRAcetam (KEPPRA) 500 MG tablet Take 1 tablet (500 mg total) by mouth 2 (two) times daily.  . [DISCONTINUED] loperamide (IMODIUM A-D) 2 MG tablet Take 8 mg by mouth daily as needed for diarrhea or loose stools.  . [DISCONTINUED] Vitamin D, Ergocalciferol, (DRISDOL) 1.25 MG (50000 UT) CAPS capsule Take 50,000 Units by mouth every 7 (seven) days.     Allergies:   Methadone and Aspirin   Past Medical History:  Diagnosis Date  . Arthritis   . Asthma   . COPD (chronic obstructive pulmonary disease) ([place])   . Depression   . Fibromyalgia   . Frequent urination at night   . GERD (gastroesophageal reflux disease)   . Hypercholesteremia   . IBS (irritable bowel syndrome)   . Tachycardia    treated with Cardizem  . Tendonitis     Past Surgical History:  Procedure Laterality  Date  . ABDOMINAL HYSTERECTOMY    . APPENDECTOMY    . KNEE ARTHROPLASTY Right 05/23/2017     Social History:  The patient  reports that she quit smoking about 4 weeks ago. Her smoking use included cigarettes. She smoked 0.25 packs per day. She has never used smokeless tobacco. She reports that she does not drink alcohol or use drugs.   Family History:  The patient's family history includes Diabetes in her brother.    ROS:  Please see the history of present illness. All other systems are reviewed and  Negative to the above problem except as noted.    PHYSICAL EXAM: VS:  BP (!) 132/94   Pulse (!) 113   Temp 98.6 F (37 C)   Ht 5\' 2"  (1.575 m)   Wt 194 lb 9.6 oz (88.3 kg)   SpO2 97%   BMI 35.59 kg/m    GEN: Morbidly obese 59 yo  in no acute distress  HEENT: normal  Neck: no JVD, carotid bruits, or masses Cardiac: RRR; no murmurs, rubs, or gallops,no edema  Respiratory:  Moving air   Diffuse wheezes, pops   GI: soft, nontender, nondistended, + BS  No hepatomegaly  MS: no deformity Moving all extremities   Skin: warm and dry, no rash Neuro:  Strength and sensation are intact Psych: euthymic mood, full affect   EKG:  EKG is not ordered today.  On 05/31/19:   ST 103 bpm  LBBB   Lipid Panel No results found for: CHOL, TRIG, HDL, CHOLHDL, VLDL, LDLCALC, LDLDIRECT    Wt Readings from Last 3 Encounters:  06/07/19 194 lb 9.6 oz (88.3 kg)  02/11/19 194 lb (88 kg)  12/26/18 201 lb 4.5 oz (91.3 kg)      ASSESSMENT AND PLAN:  1  Abnormal EKG   LBBB on recent EKG   Will set up for echo as well as CT coronary angiogram (not Lexiscan given lung issues.  2  CP   Atypical for cardiac  Will still get CT angiogram given severe COPD and EKG changes  3   HL   Need to get copy of labs from PCP  4   Blood pressure   Diastolic is a little high  Will need to be followed  5   COPD   Severe    Follows with PCP   Says she quit smoking     F/U will be based on test results     Current medicines are reviewed at length with the patient today.  The patient does not have concerns regarding medicines.  Signed, Dorris Carnes, MD  06/07/2019 1:08 PM    [facility] [place], [place], [place]  [address] Phone: [phone]; Fax: [phone]

## 2019-06-07 NOTE — Patient Instructions (Signed)
Medication Instructions:  Your physician recommends that you continue on your current medications as directed. Please refer to the Current Medication list given to you today.  Take Lopressor 100 mg Two Hours prior to CT Scan.   *If you need a refill on your cardiac medications before your next appointment, please call your pharmacy*  Lab Work: Your physician recommends that you return for lab work in: Just prior to having CT scan done.   If you have labs (blood work) drawn today and your tests are completely normal, you will receive your results only by: Marland Kitchen MyChart Message (if you have MyChart) OR . A paper copy in the mail If you have any lab test that is abnormal or we need to change your treatment, we will call you to review the results.  Testing/Procedures: Your physician has requested that you have an echocardiogram. Echocardiography is a painless test that uses sound waves to create images of your heart. It provides your doctor with information about the size and shape of your heart and how well your heart's chambers and valves are working. This procedure takes approximately one hour. There are no restrictions for this procedure.    Follow-Up: At [facility], you and your health needs are our priority.  As part of our continuing mission to provide you with exceptional heart care, we have created designated Provider Care Teams.  These Care Teams include your primary Cardiologist (physician) and Advanced Practice Providers (APPs -  Physician Assistants and Nurse Practitioners) who all work together to provide you with the care you need, when you need it.  Your next appointment:    Pending Test Results   The format for your next appointment:   In Person  Provider:   Dorris Carnes, MD  Other Instructions Your cardiac CT will be scheduled at one of the below locations:   [facility] [address] [place], [place] [address] [phone]  [place] [address] [place], [place] [address] [phone]  If scheduled at [facility], please arrive at the [facility] main entrance of [facility] 30-45 minutes prior to test start time. Proceed to the [facility] Radiology Department (first floor) to check-in and test prep.  If scheduled at [facility], please arrive 15 mins early for check-in and test prep.  Please follow these instructions carefully (unless otherwise directed):  Hold all erectile dysfunction medications at least 3 days (72 hrs) prior to test.  On the Night Before the Test: . Be sure to Drink plenty of water. . Do not consume any caffeinated/decaffeinated beverages or chocolate 12 hours prior to your test. . Do not take any antihistamines 12 hours prior to your test. . If the patient has contrast allergy: ? Patient will need a prescription for Prednisone and very clear instructions (as follows): 1. Prednisone 50 mg - take 13 hours prior to test 2. Take another Prednisone 50 mg 7 hours prior to test 3. Take another Prednisone 50 mg 1 hour prior to test 4. Take Benadryl 50 mg 1 hour prior to test . Patient must complete all four doses of above prophylactic medications. . Patient will need a ride after test due to Benadryl.  On the Day of the Test: . Drink plenty of water. Do not drink any water within one hour of the test. . Do not eat any food 4 hours prior to the test. . You may take  your regular medications prior to the test.  . Take metoprolol (Lopressor) two hours prior to test. . HOLD Furosemide/Hydrochlorothiazide morning of the test. . FEMALES- please wear underwire-free bra if available   *For Clinical Staff only. Please instruct patient the following:*        -Drink plenty of water       -Hold Furosemide/hydrochlorothiazide morning of the test       -Take metoprolol (Lopressor) 2 hours prior to test (if applicable).                   -If HR is less than 55 BPM- No Beta Blocker                -IF HR is greater than 55 BPM and patient is less than or equal to 58 yrs old Lopressor 100mg  x1.                -If HR is greater than 55 BPM and patient is greater than 58 yrs old Lopressor 50 mg x1.     Do not give Lopressor to patients with an allergy to lopressor or anyone with asthma or active COPD symptoms (currently taking steroids).       After the Test: . Drink plenty of water. . After receiving IV contrast, you may experience a mild flushed feeling. This is normal. . On occasion, you may experience a mild rash up to 24 hours after the test. This is not dangerous. If this occurs, you can take Benadryl 25 mg and increase your fluid intake. . If you experience trouble breathing, this can be serious. If it is severe call 911 IMMEDIATELY. If it is mild, please call our office. . If you take any of these medications: Glipizide/Metformin, Avandament, Glucavance, please do not take 48 hours after completing test unless otherwise instructed.   Once we have confirmed authorization from your insurance company, we will call you to set up a date and time for your test.   For non-scheduling related questions, please contact the cardiac imaging nurse navigator should you have any questions/concerns: Marchia Bond, RN Navigator Cardiac Imaging [facility] [phone] Office

## 2019-06-19 ENCOUNTER — Other Ambulatory Visit (HOSPITAL_COMMUNITY): Payer: Medicaid Other

## 2019-06-20 ENCOUNTER — Other Ambulatory Visit (HOSPITAL_COMMUNITY)
Admission: RE | Admit: 2019-06-20 | Discharge: 2019-06-20 | Disposition: A | Discharge status: Home / Self Care | Payer: Medicaid Other | Source: Ambulatory Visit | Attending: Internal Medicine | Admitting: Internal Medicine

## 2019-06-20 ENCOUNTER — Other Ambulatory Visit: Payer: Self-pay

## 2019-06-20 DIAGNOSIS — I208 Other forms of angina pectoris: Secondary | ICD-10-CM | POA: Diagnosis present

## 2019-06-20 LAB — BASIC METABOLIC PANEL
Anion gap: 10 (ref 5–15)
BUN: 16 mg/dL (ref 6–20)
CO2: 24 mmol/L (ref 22–32)
Calcium: 8.7 mg/dL — ABNORMAL LOW (ref 8.9–10.3)
Chloride: 103 mmol/L (ref 98–111)
Creatinine, Ser: 0.82 mg/dL (ref 0.44–1.00)
GFR calc Af Amer: >60 mL/min (ref >60)
GFR calc non Af Amer: >60 mL/min (ref >60)
Glucose, Bld: 128 mg/dL — ABNORMAL HIGH (ref 70–99)
Potassium: 3.3 mmol/L — ABNORMAL LOW (ref 3.5–5.1)
Sodium: 137 mmol/L (ref 135–145)

## 2019-06-26 ENCOUNTER — Other Ambulatory Visit: Payer: Self-pay

## 2019-06-26 ENCOUNTER — Ambulatory Visit (HOSPITAL_COMMUNITY)
Admission: RE | Admit: 2019-06-26 | Discharge: 2019-06-26 | Disposition: A | Discharge status: Home / Self Care | Payer: Medicaid Other | Source: Ambulatory Visit | Attending: Internal Medicine | Admitting: Internal Medicine

## 2019-06-26 DIAGNOSIS — R9431 Abnormal electrocardiogram [ECG] [EKG]: Secondary | ICD-10-CM | POA: Diagnosis present

## 2019-06-26 NOTE — Progress Notes (Signed)
*  PRELIMINARY RESULTS* Echocardiogram 2D Echocardiogram has been performed.  Theresa Pace 06/26/2019, 11:13 AM

## 2019-07-10 ENCOUNTER — Encounter: Payer: Self-pay | Admitting: *Deleted

## 2019-07-12 ENCOUNTER — Telehealth (HOSPITAL_COMMUNITY): Payer: Self-pay | Admitting: Emergency Medicine

## 2019-07-12 NOTE — Telephone Encounter (Signed)
Reaching out to patient to offer assistance regarding upcoming cardiac imaging study; pt verbalizes understanding of appt date/time, parking situation and where to check in, pre-test NPO status and medications ordered, and verified current allergies; name and call back number provided for further questions should they arise Theresa Bond RN Navigator Cardiac Imaging [facility] [phone] office [phone] cell  Pt failed to pick up rx for metoprolol tartrate in Jan 2021. I called her pharm to ensure it would be available for her to pick up. They are filling it. Called the patient back and she verbalized understanding to pick it up this afternoon or tomorrow and to take it 2 hr prior to test.

## 2019-07-15 ENCOUNTER — Other Ambulatory Visit: Payer: Self-pay

## 2019-07-15 ENCOUNTER — Encounter (HOSPITAL_COMMUNITY): Payer: Self-pay

## 2019-07-15 ENCOUNTER — Ambulatory Visit (HOSPITAL_COMMUNITY)
Admission: RE | Admit: 2019-07-15 | Discharge: 2019-07-15 | Disposition: A | Discharge status: Home / Self Care | Payer: Medicaid Other | Source: Ambulatory Visit | Attending: Internal Medicine | Admitting: Internal Medicine

## 2019-07-15 DIAGNOSIS — I208 Other forms of angina pectoris: Secondary | ICD-10-CM | POA: Insufficient documentation

## 2019-07-15 MED ORDER — IOHEXOL 350 MG/ML SOLN
80.0000 mL | Freq: Once | INTRAVENOUS | Status: AC | PRN
  Administered 2019-07-15: 80 mL via INTRAVENOUS

## 2019-07-15 MED ORDER — NITROGLYCERIN 0.4 MG SL SUBL
SUBLINGUAL_TABLET | SUBLINGUAL | Status: AC
  Filled 2019-07-15: qty 2

## 2019-07-15 MED ORDER — NITROGLYCERIN 0.4 MG SL SUBL
0.8000 mg | SUBLINGUAL_TABLET | Freq: Once | SUBLINGUAL | Status: AC
  Administered 2019-07-15: 0.8 mg via SUBLINGUAL

## 2019-07-19 ENCOUNTER — Telehealth: Payer: Self-pay | Admitting: Internal Medicine

## 2019-07-19 NOTE — Telephone Encounter (Signed)
New Message  Pt is calling to receive the results of her lab test.   Please call to discuss

## 2019-07-19 NOTE — Telephone Encounter (Signed)
Provided echo and ct results.  Pt is taking Crestor 20 mg daily. Is due for lipids, will have these done within the next couple weeks at her PCP office.  They have been managing her lipids.

## 2019-09-12 ENCOUNTER — Telehealth: Payer: Self-pay | Admitting: *Deleted

## 2019-09-12 NOTE — Telephone Encounter (Signed)
   [facility] Pre-operative Risk Assessment    Request for surgical clearance:  1. What type of surgery is being performed? LEFT KNEE ARTHROPLASTY   2. When is this surgery scheduled? TBD   3. What type of clearance is required (medical clearance vs. Pharmacy clearance to hold med vs. Both)? MEDICAL  4. Are there any medications that need to be held prior to surgery and how long? ASA    5. Practice name and name of physician performing surgery? [facility]; DR. Remo Lipps CASE   6. What is your office phone number [phone]    7.   What is your office fax number [phone]  8.   Anesthesia type (None, local, MAC, general) ? GENERAL   Theresa Pace 09/12/2019, 2:28 PM  _________________________________________________________________   (provider comments below)

## 2019-09-12 NOTE — Telephone Encounter (Signed)
Patient just had coronary CT angiogram which showed mild CAD   Nothing flow limiting  This is after EKG done She is OK to hold ASA in the periop period  Resume when safe after

## 2019-09-13 NOTE — Telephone Encounter (Signed)
   Primary Cardiologist: Dorris Carnes, MD  Chart reviewed as part of pre-operative protocol coverage. Given past medical history and time since last visit, based on ACC/AHA guidelines, Theresa Pace would be at acceptable risk for the planned procedure without further cardiovascular testing.   She may hold her aspirin for 7 days prior to surgery.  Please have her resume her aspirin as soon as hemostasis is achieved.  I will route this recommendation to the requesting party via Epic fax function and remove from pre-op pool.  Please call with questions.  Jossie Ng. Amarien Carne NP-C    09/13/2019, 7:40 AM [place] [address] Office [phone] Fax [phone]

## 2019-10-15 DIAGNOSIS — K621 Rectal polyp: Secondary | ICD-10-CM | POA: Insufficient documentation

## 2020-01-29 DIAGNOSIS — D72829 Elevated white blood cell count, unspecified: Secondary | ICD-10-CM | POA: Diagnosis present

## 2020-02-11 ENCOUNTER — Encounter (HOSPITAL_COMMUNITY): Payer: Self-pay | Admitting: Physical Therapy

## 2020-02-11 ENCOUNTER — Ambulatory Visit (HOSPITAL_COMMUNITY): Payer: Medicaid Other | Attending: Orthopedic Surgery | Admitting: Physical Therapy

## 2020-02-11 ENCOUNTER — Other Ambulatory Visit: Payer: Self-pay

## 2020-02-11 DIAGNOSIS — M6281 Muscle weakness (generalized): Secondary | ICD-10-CM | POA: Diagnosis present

## 2020-02-11 DIAGNOSIS — R2689 Other abnormalities of gait and mobility: Secondary | ICD-10-CM | POA: Insufficient documentation

## 2020-02-11 DIAGNOSIS — M25562 Pain in left knee: Secondary | ICD-10-CM | POA: Diagnosis not present

## 2020-02-11 DIAGNOSIS — M25662 Stiffness of left knee, not elsewhere classified: Secondary | ICD-10-CM | POA: Insufficient documentation

## 2020-02-11 NOTE — Patient Instructions (Signed)
Access Code: [email or web address] URL: [email or web address] Date: 02/11/2020 Prepared by: Josue Hector  Exercises Supine Quad Set - 3 x daily - 7 x weekly - 2 sets - 10 reps - 5 sec hold Supine Gluteal Sets - 3 x daily - 7 x weekly - 2 sets - 10 reps - 5 sec hold Supine Heel Slide - 3 x daily - 7 x weekly - 2 sets - 10 reps - 5 sec hold Supine Knee Extension Mobilization with Weight - 3 x daily - 7 x weekly - 1 sets - 1 reps - 2 minutes hold

## 2020-02-11 NOTE — Therapy (Signed)
**Note Theresa-Identified via Obfuscation** Theresa Pace, [place], [address] Phone: [phone]   Fax:  [phone]  Physical Therapy Evaluation  Patient Details  Name: Theresa Pace MRN: [id number] Date of Birth: Feb 20, 1961 Referring Provider (PT): Remo Lipps Case MD    Encounter Date: 02/11/2020   PT End of Session - 02/11/20 1717    Visit Number 1    Number of Visits 12    Date for PT Re-Evaluation 03/27/20    Authorization Type Medicaid [place] (check auth)    Authorization - Visit Number 1    Authorization - Number of Visits 1    Progress Note Due on Visit 4    PT Start Time 4235    PT Stop Time 1730    PT Time Calculation (min) 45 min    Activity Tolerance Patient tolerated treatment well;Patient limited by pain    Behavior During Therapy [name] for tasks assessed/performed           Past Medical History:  Diagnosis Date  . Arthritis   . Asthma   . COPD (chronic obstructive pulmonary disease) ([place])   . Depression   . Fibromyalgia   . Frequent urination at night   . GERD (gastroesophageal reflux disease)   . Hypercholesteremia   . IBS (irritable bowel syndrome)   . Tachycardia    treated with Cardizem  . Tendonitis     Past Surgical History:  Procedure Laterality Date  . ABDOMINAL HYSTERECTOMY    . APPENDECTOMY    . KNEE ARTHROPLASTY Right 05/23/2017    There were no vitals filed for this visit.    Subjective Assessment - 02/11/20 1654    Subjective Patient presents to physical therapy with complaint of LT knee pain s/p LT TKA on 9.7.21. Patient says it hurts to walk, but current prescription medications have been helping. Says she has had her RT knee replaced before, and it went much easier. Says her LT knee is tonight and wants to stay bent.    Pertinent History COPD, RT TKA 2019    Limitations Sitting;Lifting;Standing;Walking;House hold activities    How long can you stand comfortably? 5 minutes    How long can you walk comfortably?  5 minutes    Patient Stated Goals Be able to walk and bend my knee    Currently in Pain? Yes    Pain Score 8     Pain Location Knee    Pain Orientation Left    Pain Descriptors / Indicators Sharp;Stabbing    Pain Type Surgical pain    Pain Onset 1 to 4 weeks ago    Pain Frequency Constant    Aggravating Factors  moving, bending, walking, standing    Pain Relieving Factors Meds, ice, rest    Effect of Pain on Daily Activities Limits              [facility] PT Assessment - 02/11/20 0001      Assessment   Medical Diagnosis Lt TKA     Referring Provider (PT) Remo Lipps Case MD     Onset Date/Surgical Date 01/28/20    Next MD Visit 02/12/20    Prior Therapy Yes       Precautions   Precautions None      Restrictions   Weight Bearing Restrictions No      Balance Screen   Has the patient fallen in the past 6 months No      [place] residence  Living Arrangements Spouse/significant other      Prior Function   Level of Independence Independent    Vocation On disability      Cognition   Overall Cognitive Status Within Functional Limits for tasks assessed      Observation/Other Assessments   Observations post op bandage intact, no visible signs of drainage or infection       Observation/Other Assessments-Edema    Edema --   mod edema noted diffuse about LT knee joint      ROM / Strength   AROM / PROM / Strength AROM      AROM   AROM Assessment Site Knee    Right/Left Knee Right;Left    Right Knee Extension 0    Right Knee Flexion 128    Left Knee Extension 16    Left Knee Flexion 105      Transfers   Five time sit to stand comments  13.65 sec with UEs       Ambulation/Gait   Ambulation/Gait Yes    Ambulation Distance (Feet) 170 Feet    Assistive device Rolling walker    Gait Pattern Step-to pattern;Decreased step length - left;Decreased stance time - left;Decreased stride length;Decreased hip/knee flexion - left;Decreased  dorsiflexion - left;Decreased weight shift to left;Antalgic    Ambulation Surface Level;Indoor    Gait Comments 2MWT                      Objective measurements completed on examination: See above findings.       [facility] Adult PT Treatment/Exercise - 02/11/20 0001      Exercises   Exercises Knee/Hip      Knee/Hip Exercises: Stretches   Other Knee/Hip Stretches heel prop 2 min       Knee/Hip Exercises: Supine   Quad Sets Left;5 reps    Heel Slides Left;5 sets    Other Supine Knee/Hip Exercises glute set 5 x 5"                   PT Education - 02/11/20 1656    Education Details on evaluation findings, POC and HEP    Person(s) Educated Patient    Methods Explanation;Handout    Comprehension Verbalized understanding            PT Short Term Goals - 02/11/20 1723      PT SHORT TERM GOAL #1   Title Patient will be independent with initial HEP and self-management strategies to improve functional outcomes    Time 3    Period Weeks    Status New    Target Date 03/06/20      PT SHORT TERM GOAL #2   Title Patient will have LT knee AROM 5-110 degrees to improve functional mobility and facilitate squatting to pick up items from floor.    Time 3    Period Weeks    Status New    Target Date 03/06/20             PT Long Term Goals - 02/11/20 1724      PT LONG TERM GOAL #1   Title Patient will have LT knee AROM 0-120 degrees to improve functional mobility and facilitate squatting to pick up items from floor.    Time 6    Period Weeks    Status New    Target Date 03/27/20      PT LONG TERM GOAL #2   Title Patient will report at least 75%  overall improvement in subjective complaint to indicate improvement in ability to perform ADLs.    Time 6    Period Weeks    Status New    Target Date 03/27/20      PT LONG TERM GOAL #3   Title Patient will be able to perform stand x 5 in < 10 seconds to demonstrate improvement in functional mobility and  reduced risk for falls.    Time 6    Period Weeks    Status New    Target Date 03/27/20      PT LONG TERM GOAL #4   Title Patient will be able to ambulate at least 300 feet during 2MWT with LRAD to demonstrate improved ability to perform functional mobility and associated tasks.    Time 6    Period Weeks    Status New    Target Date 03/27/20                  Plan - 02/11/20 1719    Clinical Impression Statement Patient is a 59 y.o. female who presents to physical therapy with complaint of LT knee pain s/p LT TKA on 01/28/20. Patient demonstrates decreased strength, ROM restriction, reduced balance and gait deficits which are likely contributing to symptoms of pain and are negatively impacting patient ability to perform ADLs and functional mobility tasks. Patient will benefit from skilled physical therapy services to address these deficits to reduce pain and improve level of function with ADLs and functional mobility tasks.    Examination-Activity Limitations Stairs;Squat;Dressing;Transfers;Locomotion Level;Bend;Lift;Stand    Examination-Participation Restrictions Laundry;Cleaning;Yard Work;Community Activity    Stability/Clinical Decision Making Stable/Uncomplicated    Clinical Decision Making Low    Rehab Potential Good    PT Frequency 2x / week    PT Duration 6 weeks    PT Treatment/Interventions ADLs/Self Care Home Management;Aquatic Therapy;Biofeedback;Cryotherapy;Parrafin;Fluidtherapy;Therapeutic activities;Functional mobility training;Patient/family education;Ultrasound;Traction;Moist Heat;Electrical Stimulation;Contrast Bath;Therapeutic exercise;DME Instruction;Iontophoresis 4mg /ml Dexamethasone;Gait training;Stair training;Spinal Manipulations;Energy conservation;Splinting;Dry needling;Joint Manipulations;Taping;Vasopneumatic Device;Compression bandaging;Scar mobilization;Passive range of motion;Neuromuscular re-education;Balance training;Orthotic Fit/Training;Manual  techniques;Manual lymph drainage    PT Next Visit Plan Review goals and HEP. Progress LE strength and ROM on mat as tolerated. Progress to standing funcitonal strength, gait and balance activity as able.    PT Home Exercise Plan 02/11/20: quad set, heel slide, glute set, heel prop    Consulted and Agree with Plan of Care Patient           Patient will benefit from skilled therapeutic intervention in order to improve the following deficits and impairments:  Pain, Improper body mechanics, Increased fascial restricitons, Abnormal gait, Impaired flexibility, Increased edema, Difficulty walking, Decreased balance, Decreased activity tolerance, Decreased range of motion, Decreased strength, Hypomobility  Visit Diagnosis: Left knee pain, unspecified chronicity  Stiffness of left knee, not elsewhere classified  Other abnormalities of gait and mobility  Muscle weakness (generalized)     Problem List Patient Active Problem List   Diagnosis Date Noted  . Closed fracture of right ankle 12/25/2018 03/21/2019  . Primary osteoarthritis of left knee 03/14/2019  . Acute metabolic encephalopathy 71/24/5809  . Altered mental status   . Seizure-like activity ([place])   . Hypokalemia   . Elevated glucose level   . Prolonged Q-T interval on ECG   . IBS (irritable bowel syndrome) 06/06/2018  . History of colon polyps 06/06/2018  . History of total right knee replacement 06/28/2017  . Primary localized osteoarthrosis, lower leg 02/20/2017  . Acute respiratory distress 07/07/2016  . Asthma exacerbation 07/07/2016  .  Sinus tachycardia 10/22/2015  . Acute exacerbation of chronic obstructive pulmonary disease (COPD) ([place]) 10/21/2015  . Anxiety 10/21/2015  . GERD (gastroesophageal reflux disease) 10/21/2015  . Chronic pain 10/21/2015  . Hyperlipidemia 10/21/2015  . Acute respiratory failure with hypoxia ([place]) 02/18/2015  . COPD (chronic obstructive pulmonary disease) ([place]) 02/16/2015  . COPD  exacerbation ([place]) 02/16/2015  . Mycoplasma pneumonia 02/16/2015  . Right knee pain 03/13/2013  . High risk medication use 02/14/2013  . Spondylolisthesis of lumbosacral region 02/14/2013  . Chronic pain syndrome 02/14/2013  . Seizure ([place]) 02/22/2012  . Bleeding hemorrhoid 02/22/2012  . Acute encephalopathy 02/21/2012  . Asthma 02/21/2012  . Depression 02/21/2012    5:39 PM, 02/11/20 Josue Hector PT DPT  Physical Therapist with [facility]  [phone] [address] [place], [place], [address] Phone: [phone]   Fax:  [phone]  Name: Theresa Pace MRN: [id number] Date of Birth: May 30, 1960

## 2020-02-13 ENCOUNTER — Encounter (HOSPITAL_COMMUNITY): Payer: Medicaid Other | Admitting: Physical Therapy

## 2020-02-14 ENCOUNTER — Telehealth (HOSPITAL_COMMUNITY): Payer: Self-pay | Admitting: Physical Therapy

## 2020-02-14 ENCOUNTER — Ambulatory Visit (HOSPITAL_COMMUNITY): Payer: Medicaid Other | Admitting: Physical Therapy

## 2020-02-14 NOTE — Telephone Encounter (Signed)
Patient forgot she had an apptment in [place] and she will not be back in time -please cx today

## 2020-02-17 ENCOUNTER — Ambulatory Visit (HOSPITAL_COMMUNITY): Payer: Medicaid Other | Admitting: Physical Therapy

## 2020-02-17 ENCOUNTER — Other Ambulatory Visit: Payer: Self-pay

## 2020-02-17 DIAGNOSIS — R2689 Other abnormalities of gait and mobility: Secondary | ICD-10-CM

## 2020-02-17 DIAGNOSIS — M25562 Pain in left knee: Secondary | ICD-10-CM | POA: Diagnosis not present

## 2020-02-17 DIAGNOSIS — M25662 Stiffness of left knee, not elsewhere classified: Secondary | ICD-10-CM

## 2020-02-17 DIAGNOSIS — M6281 Muscle weakness (generalized): Secondary | ICD-10-CM

## 2020-02-17 NOTE — Therapy (Signed)
[place], [place], [address] Phone: [phone]   Fax:  [phone]  Physical Therapy Treatment  Patient Details  Name: Theresa Pace MRN: [id number] Date of Birth: 1961/04/30 Referring Provider (PT): Remo Lipps Case MD    Encounter Date: 02/17/2020   PT End of Session - 02/17/20 1531    Visit Number 2    Number of Visits 12    Date for PT Re-Evaluation 03/27/20    Authorization Type Medicaid [place]; 3 units approved 9/23-10/6    Authorization - Visit Number 1    Authorization - Number of Visits 3    Progress Note Due on Visit 4    PT Start Time 0254    PT Stop Time 1625    PT Time Calculation (min) 40 min    Activity Tolerance Patient tolerated treatment well;Patient limited by pain    Behavior During Therapy [facility] for tasks assessed/performed           Past Medical History:  Diagnosis Date  . Arthritis   . Asthma   . COPD (chronic obstructive pulmonary disease) ([place])   . Depression   . Fibromyalgia   . Frequent urination at night   . GERD (gastroesophageal reflux disease)   . Hypercholesteremia   . IBS (irritable bowel syndrome)   . Tachycardia    treated with Cardizem  . Tendonitis     Past Surgical History:  Procedure Laterality Date  . ABDOMINAL HYSTERECTOMY    . APPENDECTOMY    . KNEE ARTHROPLASTY Right 05/23/2017    There were no vitals filed for this visit.   Subjective Assessment - 02/17/20 1503    Subjective pt states she has been taking off one of her meds and she is shaky today.  States she almost cancelled her appt due to this.  No knee pain or other issues currently.    Currently in Pain? No/denies                             [facility] Adult PT Treatment/Exercise - 02/17/20 0001      Knee/Hip Exercises: Stretches   Knee: Self-Stretch to increase Flexion Right;10 seconds    Knee: Self-Stretch Limitations 10 reps onto 12" step    Other Knee/Hip Stretches heel  prop 2 min       Knee/Hip Exercises: Standing   Heel Raises 10 reps      Knee/Hip Exercises: Seated   Long Arc Quad Left;10 reps      Knee/Hip Exercises: Supine   Quad Sets Left;5 reps    Short Arc Quad Sets Left;10 reps    Heel Slides Left;5 sets    Knee Extension AROM    Knee Extension Limitations 20    Knee Flexion AROM    Knee Flexion Limitations 110                  PT Education - 02/17/20 1530    Education Details Heel to toe gait, wearing closed heel shoes for safety and improved support.    Person(s) Educated Patient    Methods Explanation    Comprehension Verbalized understanding            PT Short Term Goals - 02/11/20 1723      PT SHORT TERM GOAL #1   Title Patient will be independent with initial HEP and self-management strategies to improve functional outcomes    Time 3  Period Weeks    Status New    Target Date 03/06/20      PT SHORT TERM GOAL #2   Title Patient will have LT knee AROM 5-110 degrees to improve functional mobility and facilitate squatting to pick up items from floor.    Time 3    Period Weeks    Status New    Target Date 03/06/20             PT Long Term Goals - 02/11/20 1724      PT LONG TERM GOAL #1   Title Patient will have LT knee AROM 0-120 degrees to improve functional mobility and facilitate squatting to pick up items from floor.    Time 6    Period Weeks    Status New    Target Date 03/27/20      PT LONG TERM GOAL #2   Title Patient will report at least 75% overall improvement in subjective complaint to indicate improvement in ability to perform ADLs.    Time 6    Period Weeks    Status New    Target Date 03/27/20      PT LONG TERM GOAL #3   Title Patient will be able to perform stand x 5 in < 10 seconds to demonstrate improvement in functional mobility and reduced risk for falls.    Time 6    Period Weeks    Status New    Target Date 03/27/20      PT LONG TERM GOAL #4   Title Patient will be  able to ambulate at least 300 feet during 2MWT with LRAD to demonstrate improved ability to perform functional mobility and associated tasks.    Time 6    Period Weeks    Status New    Target Date 03/27/20                 Plan - 02/17/20 1533    Clinical Impression Statement Began patient on bike at beginning of session to improve AROM and warm up mm.  Pt able to make full revolutions however initially very  hesitant to go all the way around.   Notibly "shaky" during session today with tendency sudden jerky movements and instability.  Unable to produce a smooth mm contraction due to this.  Remainder of session worked on [employer] exercises/ROM as tolerated and ended with soft tissue massage to Lt knee including reduction of adhesions.  AROM today reduced for extension but improved for flexion at 20-110.  Encouraged to work on her extension more at home as well as heel-toe gait with Lt LE (tends to walk on toe).  Instructed patient to wear tennis shoes next session as she had on slide-ons today.    Examination-Activity Limitations Stairs;Squat;Dressing;Transfers;Locomotion Level;Bend;Lift;Stand    Examination-Participation Restrictions Laundry;Cleaning;Yard Work;Community Activity    Stability/Clinical Decision Making Stable/Uncomplicated    Rehab Potential Good    PT Frequency 2x / week    PT Duration 6 weeks    PT Treatment/Interventions ADLs/Self Care Home Management;Aquatic Therapy;Biofeedback;Cryotherapy;Parrafin;Fluidtherapy;Therapeutic activities;Functional mobility training;Patient/family education;Ultrasound;Traction;Moist Heat;Electrical Stimulation;Contrast Bath;Therapeutic exercise;DME Instruction;Iontophoresis 4mg /ml Dexamethasone;Gait training;Stair training;Spinal Manipulations;Energy conservation;Splinting;Dry needling;Joint Manipulations;Taping;Vasopneumatic Device;Compression bandaging;Scar mobilization;Passive range of motion;Neuromuscular re-education;Balance training;Orthotic  Fit/Training;Manual techniques;Manual lymph drainage    PT Next Visit Plan Review goals and HEP. Progress LE strength and ROM on mat as tolerated. Progress to standing funcitonal strength, gait and balance activity as able.    PT Home Exercise Plan 02/11/20: quad set, heel slide, glute set, heel prop  Consulted and Agree with Plan of Care Patient           Patient will benefit from skilled therapeutic intervention in order to improve the following deficits and impairments:  Pain, Improper body mechanics, Increased fascial restricitons, Abnormal gait, Impaired flexibility, Increased edema, Difficulty walking, Decreased balance, Decreased activity tolerance, Decreased range of motion, Decreased strength, Hypomobility  Visit Diagnosis: Left knee pain, unspecified chronicity  Stiffness of left knee, not elsewhere classified  Other abnormalities of gait and mobility  Muscle weakness (generalized)     Problem List Patient Active Problem List   Diagnosis Date Noted  . Closed fracture of right ankle 12/25/2018 03/21/2019  . Primary osteoarthritis of left knee 03/14/2019  . Acute metabolic encephalopathy 76/80/8811  . Altered mental status   . Seizure-like activity ([place])   . Hypokalemia   . Elevated glucose level   . Prolonged Q-T interval on ECG   . IBS (irritable bowel syndrome) 06/06/2018  . History of colon polyps 06/06/2018  . History of total right knee replacement 06/28/2017  . Primary localized osteoarthrosis, lower leg 02/20/2017  . Acute respiratory distress 07/07/2016  . Asthma exacerbation 07/07/2016  . Sinus tachycardia 10/22/2015  . Acute exacerbation of chronic obstructive pulmonary disease (COPD) ([place]) 10/21/2015  . Anxiety 10/21/2015  . GERD (gastroesophageal reflux disease) 10/21/2015  . Chronic pain 10/21/2015  . Hyperlipidemia 10/21/2015  . Acute respiratory failure with hypoxia ([place]) 02/18/2015  . COPD (chronic obstructive pulmonary disease) ([place]) 02/16/2015   . COPD exacerbation ([place]) 02/16/2015  . Mycoplasma pneumonia 02/16/2015  . Right knee pain 03/13/2013  . High risk medication use 02/14/2013  . Spondylolisthesis of lumbosacral region 02/14/2013  . Chronic pain syndrome 02/14/2013  . Seizure ([place]) 02/22/2012  . Bleeding hemorrhoid 02/22/2012  . Acute encephalopathy 02/21/2012  . Asthma 02/21/2012  . Depression 02/21/2012   Teena Irani, PTA/CLT [phone]  Teena Irani 02/17/2020, 4:18 PM  [place] [address] [place], [place], [address] Phone: [phone]   Fax:  [phone]  Name: CHONG JANUARY MRN: [id number] Date of Birth: Mar 26, 1961

## 2020-02-18 NOTE — Addendum Note (Signed)
Addended by: Josue Hector A on: 02/18/2020 02:26 PM   Modules accepted: Orders

## 2020-02-19 ENCOUNTER — Encounter (HOSPITAL_COMMUNITY): Payer: Medicaid Other | Admitting: Physical Therapy

## 2020-02-21 ENCOUNTER — Ambulatory Visit (HOSPITAL_COMMUNITY): Payer: Medicaid Other | Admitting: Physical Therapy

## 2020-02-21 ENCOUNTER — Telehealth (HOSPITAL_COMMUNITY): Payer: Self-pay | Admitting: Physical Therapy

## 2020-02-21 ENCOUNTER — Encounter (HOSPITAL_COMMUNITY): Payer: Self-pay

## 2020-02-21 NOTE — Telephone Encounter (Signed)
pt called to cx today's appt due to she has a bladder infection

## 2020-02-25 ENCOUNTER — Other Ambulatory Visit: Payer: Self-pay

## 2020-02-25 ENCOUNTER — Ambulatory Visit (HOSPITAL_COMMUNITY): Payer: Medicaid Other | Attending: Orthopedic Surgery

## 2020-02-25 ENCOUNTER — Encounter (HOSPITAL_COMMUNITY): Payer: Self-pay

## 2020-02-25 DIAGNOSIS — M25662 Stiffness of left knee, not elsewhere classified: Secondary | ICD-10-CM

## 2020-02-25 DIAGNOSIS — R2689 Other abnormalities of gait and mobility: Secondary | ICD-10-CM

## 2020-02-25 DIAGNOSIS — M6281 Muscle weakness (generalized): Secondary | ICD-10-CM

## 2020-02-25 DIAGNOSIS — M25562 Pain in left knee: Secondary | ICD-10-CM | POA: Diagnosis present

## 2020-02-25 NOTE — Therapy (Signed)
[place], [place], [address] Phone: [phone]   Fax:  [phone]  Physical Therapy Treatment  Patient Details  Name: Theresa Pace MRN: [id number] Date of Birth: 07/04/1960 Referring Provider (PT): Remo Lipps Case MD    Encounter Date: 02/25/2020   PT End of Session - 02/25/20 1636    Visit Number 3    Number of Visits 12    Date for PT Re-Evaluation 03/27/20    Authorization Type Medicaid [place]; 3 units approved 9/23-10/6    Authorization - Visit Number 2    Authorization - Number of Visits 3    Progress Note Due on Visit 4    PT Start Time 6333    PT Stop Time 1657    PT Time Calculation (min) 40 min    Activity Tolerance Patient limited by pain;No increased pain;Patient tolerated treatment well    Behavior During Therapy [facility] for tasks assessed/performed           Past Medical History:  Diagnosis Date  . Arthritis   . Asthma   . COPD (chronic obstructive pulmonary disease) ([place])   . Depression   . Fibromyalgia   . Frequent urination at night   . GERD (gastroesophageal reflux disease)   . Hypercholesteremia   . IBS (irritable bowel syndrome)   . Tachycardia    treated with Cardizem  . Tendonitis     Past Surgical History:  Procedure Laterality Date  . ABDOMINAL HYSTERECTOMY    . APPENDECTOMY    . KNEE ARTHROPLASTY Right 05/23/2017    There were no vitals filed for this visit.   Subjective Assessment - 02/25/20 1621    Subjective Pt stated she has a lot of pain in knee today, pain scale 7/10 today.    Pertinent History COPD, RT TKA 2019    Patient Stated Goals Be able to walk and bend my knee    Currently in Pain? Yes    Pain Score 7     Pain Location Knee    Pain Orientation Left    Pain Descriptors / Indicators Aching;Sharp;Tightness    Pain Type Surgical pain    Pain Onset 1 to 4 weeks ago    Pain Frequency Constant    Aggravating Factors  moving, bending, walking, standing     Pain Relieving Factors MEds, ice rest              [facility] PT Assessment - 02/25/20 0001      Assessment   Medical Diagnosis Lt TKA     Referring Provider (PT) Remo Lipps Case MD     Onset Date/Surgical Date 01/28/20    Next MD Visit 03/16/20    Prior Therapy Yes       ROM / Strength   AROM / PROM / Strength AROM      AROM   AROM Assessment Site Knee    Right/Left Knee Right;Left    Right Knee Extension 0    Right Knee Flexion 128    Left Knee Extension 12   was 16   Left Knee Flexion 125   was 105     Transfers   Five time sit to stand comments  11.92" hands on lap   was 13.65 sec with UEs      Ambulation/Gait   Ambulation/Gait Yes    Ambulation Distance (Feet) 184 Feet   was 170 ft   Assistive device Rolling walker    Gait Pattern Step-to  pattern;Decreased step length - left;Decreased stance time - left;Decreased stride length;Decreased hip/knee flexion - left;Decreased dorsiflexion - left;Decreased weight shift to left;Antalgic    Ambulation Surface Level;Indoor                         [facility] Adult PT Treatment/Exercise - 02/25/20 0001      Ambulation/Gait   Gait Comments 2MWT      Knee/Hip Exercises: Stretches   Active Hamstring Stretch 3 reps;30 seconds    Active Hamstring Stretch Limitations supine with rope    Gastroc Stretch 3 reps;30 seconds    Gastroc Stretch Limitations slant board      Knee/Hip Exercises: Standing   Heel Raises 10 reps    Heel Raises Limitations toe raises    Rocker Board 2 minutes    Rocker Board Limitations lateral    Gait Training Gait training to improve heel strike     Other Standing Knee Exercises 2MWT 138f iwth RW      Knee/Hip Exercises: Seated   Long Arc Quad Left;10 reps      Knee/Hip Exercises: Supine   Quad Sets Left;2 sets;5 reps    Short Arc Quad Sets Left;2 sets;10 reps    Heel Slides Left;10 reps    Knee Extension AROM    Knee Extension Limitations 12    Knee Flexion AROM    Knee Flexion  Limitations 125                    PT Short Term Goals - 02/25/20 1622      PT SHORT TERM GOAL #1   Title Patient will be independent with initial HEP and self-management strategies to improve functional outcomes    Baseline 02/25/20:  Reports compliance with HEP 2x daily    Status On-going      PT SHORT TERM GOAL #2   Title Patient will have LT knee AROM 5-110 degrees to improve functional mobility and facilitate squatting to pick up items from floor.    Baseline 02/25/20: AROM 12-125 degrees (was 16-105 eval)    Status Partially Met             PT Long Term Goals - 02/25/20 1626      PT LONG TERM GOAL #1   Title Patient will have LT knee AROM 0-120 degrees to improve functional mobility and facilitate squatting to pick up items from floor.    Baseline 02/25/20: AROM 12-125 degrees (was 16-105 eval)      PT LONG TERM GOAL #2   Title Patient will report at least 75% overall improvement in subjective complaint to indicate improvement in ability to perform ADLs.    Baseline 02/25/20: reports 80% improvement, feels improvements with independent functional activities and improved gait mechanics    Status Partially Met      PT LONG TERM GOAL #3   Title Patient will be able to perform stand x 5 in < 10 seconds to demonstrate improvement in functional mobility and reduced risk for falls.    Baseline 02/25/20: 5 STS no HHA 11.92" increased WB Rt>Lt    Status On-going      PT LONG TERM GOAL #4   Title Patient will be able to ambulate at least 300 feet during 2MWT with LRAD to demonstrate improved ability to perform functional mobility and associated tasks.    Baseline 5/1: intermittently use [facility] in [facility]  Plan - 02/25/20 1642    Clinical Impression Statement Reviewed goals for medicaid re-authorization.  Pt continues to have limitations with knee extension, gait mechanics, functional strength and balance.  Session  focus on knee mobility and gait training.  Pt with tendency to toe walk for comfort, cueing to improve heel strike to normalize gait mechanics.  Reviewed current HEP compliance, encouraged to increase quad sets to address knee extension lag.  Reviewed RICE techniques for pain and edema control.  Pt may benefit from manual to address scar tissue adhesions and edema control in further sessoins.    Examination-Activity Limitations Stairs;Squat;Dressing;Transfers;Locomotion Level;Bend;Lift;Stand    Examination-Participation Restrictions Laundry;Cleaning;Yard Work;Community Activity    Stability/Clinical Decision Making Stable/Uncomplicated    Clinical Decision Making Low    Rehab Potential Good    PT Frequency 2x / week    PT Duration 6 weeks    PT Treatment/Interventions ADLs/Self Care Home Management;Aquatic Therapy;Biofeedback;Cryotherapy;Parrafin;Fluidtherapy;Therapeutic activities;Functional mobility training;Patient/family education;Ultrasound;Traction;Moist Heat;Electrical Stimulation;Contrast Bath;Therapeutic exercise;DME Instruction;Iontophoresis 58m/ml Dexamethasone;Gait training;Stair training;Spinal Manipulations;Energy conservation;Splinting;Dry needling;Joint Manipulations;Taping;Vasopneumatic Device;Compression bandaging;Scar mobilization;Passive range of motion;Neuromuscular re-education;Balance training;Orthotic Fit/Training;Manual techniques;Manual lymph drainage    PT Next Visit Plan Submit into Medicaid for further apt. Progress LE strength and ROM on mat as tolerated. Progress to standing funcitonal strength, gait and balance activity as able.    PT Home Exercise Plan 02/11/20: quad set, heel slide, glute set, heel prop           Patient will benefit from skilled therapeutic intervention in order to improve the following deficits and impairments:  Pain, Improper body mechanics, Increased fascial restricitons, Abnormal gait, Impaired flexibility, Increased edema, Difficulty walking,  Decreased balance, Decreased activity tolerance, Decreased range of motion, Decreased strength, Hypomobility  Visit Diagnosis: Left knee pain, unspecified chronicity  Stiffness of left knee, not elsewhere classified  Other abnormalities of gait and mobility  Muscle weakness (generalized)     Problem List Patient Active Problem List   Diagnosis Date Noted  . Closed fracture of right ankle 12/25/2018 03/21/2019  . Primary osteoarthritis of left knee 03/14/2019  . Acute metabolic encephalopathy 039/76/7341 . Altered mental status   . Seizure-like activity ([place]   . Hypokalemia   . Elevated glucose level   . Prolonged Q-T interval on ECG   . IBS (irritable bowel syndrome) 06/06/2018  . History of colon polyps 06/06/2018  . History of total right knee replacement 06/28/2017  . Primary localized osteoarthrosis, lower leg 02/20/2017  . Acute respiratory distress 07/07/2016  . Asthma exacerbation 07/07/2016  . Sinus tachycardia 10/22/2015  . Acute exacerbation of chronic obstructive pulmonary disease (COPD) ([place] 10/21/2015  . Anxiety 10/21/2015  . GERD (gastroesophageal reflux disease) 10/21/2015  . Chronic pain 10/21/2015  . Hyperlipidemia 10/21/2015  . Acute respiratory failure with hypoxia ([place] 02/18/2015  . COPD (chronic obstructive pulmonary disease) ([place] 02/16/2015  . COPD exacerbation ([place] 02/16/2015  . Mycoplasma pneumonia 02/16/2015  . Right knee pain 03/13/2013  . High risk medication use 02/14/2013  . Spondylolisthesis of lumbosacral region 02/14/2013  . Chronic pain syndrome 02/14/2013  . Seizure ([place] 02/22/2012  . Bleeding hemorrhoid 02/22/2012  . Acute encephalopathy 02/21/2012  . Asthma 02/21/2012  . Depression 02/21/2012   CIhor Austin LPTA/CLT; CBIS [phone] CAldona Lento10/09/2019, 5:37 PM  [place] [address]Phone: [phone]  Fax:   [phone] Name: Theresa REMPELMRN: [id number] of Birth: 802/10/1960

## 2020-02-28 ENCOUNTER — Ambulatory Visit (HOSPITAL_COMMUNITY): Payer: Medicaid Other | Admitting: Physical Therapy

## 2020-03-03 ENCOUNTER — Ambulatory Visit (HOSPITAL_COMMUNITY): Payer: Medicaid Other

## 2020-03-03 ENCOUNTER — Telehealth (HOSPITAL_COMMUNITY): Payer: Self-pay

## 2020-03-03 ENCOUNTER — Encounter (HOSPITAL_COMMUNITY): Payer: Self-pay

## 2020-03-03 NOTE — Telephone Encounter (Signed)
Car trouble can not get here please cx today

## 2020-03-06 ENCOUNTER — Other Ambulatory Visit: Payer: Self-pay

## 2020-03-06 ENCOUNTER — Encounter (HOSPITAL_COMMUNITY): Payer: Self-pay

## 2020-03-06 ENCOUNTER — Ambulatory Visit (HOSPITAL_COMMUNITY): Payer: Medicaid Other

## 2020-03-06 DIAGNOSIS — M25562 Pain in left knee: Secondary | ICD-10-CM

## 2020-03-06 DIAGNOSIS — M6281 Muscle weakness (generalized): Secondary | ICD-10-CM

## 2020-03-06 DIAGNOSIS — M25662 Stiffness of left knee, not elsewhere classified: Secondary | ICD-10-CM

## 2020-03-06 DIAGNOSIS — R2689 Other abnormalities of gait and mobility: Secondary | ICD-10-CM

## 2020-03-06 NOTE — Patient Instructions (Addendum)
Quad Sets    Squeeze pelvic floor and hold. Tighten top of left thigh.  Hold for 10 seconds. Relax for 10 seconds. Repeat 10 times every hour.  Copyright  VHI. All rights reserved.

## 2020-03-06 NOTE — Therapy (Signed)
[place], [place], [address] Phone: [phone]   Fax:  [phone]  Physical Therapy Treatment  Patient Details  Name: Theresa Pace MRN: [id number] Date of Birth: 1961-05-08 Referring Provider (PT): Remo Lipps Case MD    Encounter Date: 03/06/2020   PT End of Session - 03/06/20 1645    Visit Number 4    Number of Visits 12    Date for PT Re-Evaluation 03/27/20    Authorization Type Medicaid [place]; 3 units approved 9/23-10/6; 8 visits approved    Authorization Time Period 10/11-->03/29/20    Authorization - Visit Number 1    Authorization - Number of Visits 8    Progress Note Due on Visit 10    PT Start Time 3220    PT Stop Time 1657    PT Time Calculation (min) 40 min    Activity Tolerance Patient limited by pain;Patient tolerated treatment well    Behavior During Therapy [facility] for tasks assessed/performed           Past Medical History:  Diagnosis Date  . Arthritis   . Asthma   . COPD (chronic obstructive pulmonary disease) ([place])   . Depression   . Fibromyalgia   . Frequent urination at night   . GERD (gastroesophageal reflux disease)   . Hypercholesteremia   . IBS (irritable bowel syndrome)   . Tachycardia    treated with Cardizem  . Tendonitis     Past Surgical History:  Procedure Laterality Date  . ABDOMINAL HYSTERECTOMY    . APPENDECTOMY    . KNEE ARTHROPLASTY Right 05/23/2017    There were no vitals filed for this visit.   Subjective Assessment - 03/06/20 1617    Subjective Pt stated she continues to have a lot of pain in knee today, 6/10.  Reports the swelling has gone down some.    Pertinent History COPD, RT TKA 2019    Patient Stated Goals Be able to walk and bend my knee    Currently in Pain? Yes    Pain Score 6     Pain Location Knee    Pain Orientation Left    Pain Descriptors / Indicators Aching;Sore;Tightness    Pain Type Surgical pain    Pain Onset 1 to 4 weeks ago     Pain Frequency Constant    Aggravating Factors  moving, bending, walking, standing    Pain Relieving Factors Meds, ice, rest    Effect of Pain on Daily Activities limits                             [facility] Adult PT Treatment/Exercise - 03/06/20 0001      Ambulation/Gait   Ambulation/Gait Yes    Ambulation Distance (Feet) 150 Feet    Assistive device Rolling walker    Gait Pattern Step-to pattern;Decreased step length - left;Decreased stance time - left;Decreased stride length;Decreased hip/knee flexion - left;Decreased dorsiflexion - left;Decreased weight shift to left;Antalgic    Ambulation Surface Level;Indoor    Gait Comments cueing for heel to toe mechanics      Knee/Hip Exercises: Stretches   Active Hamstring Stretch 3 reps;30 seconds    Active Hamstring Stretch Limitations supine with rope    Gastroc Stretch 3 reps;30 seconds    Gastroc Stretch Limitations slant board      Knee/Hip Exercises: Standing   Terminal Knee Extension 10 reps    Terminal Knee  Extension Limitations pushing back of knee into therapist's hands, 5" holds    Gait Training Gait training to improve heel strike       Knee/Hip Exercises: Seated   Long Arc Quad Left;10 reps;2 sets      Knee/Hip Exercises: Supine   Quad Sets Left;5 reps;3 sets    [employer] Limitations 5" holds with tactile cueing    Short Arc Quad Sets 15 reps    Knee Extension AROM    Knee Extension Limitations 14 lacking    Knee Flexion AROM      Manual Therapy   Manual Therapy Edema management    Manual therapy comments Manual complete separate than rest of tx    Edema Management Retromassage wiht LE elevated                    PT Short Term Goals - 02/25/20 1622      PT SHORT TERM GOAL #1   Title Patient will be independent with initial HEP and self-management strategies to improve functional outcomes    Baseline 02/25/20:  Reports compliance with HEP 2x daily    Status On-going      PT SHORT TERM  GOAL #2   Title Patient will have LT knee AROM 5-110 degrees to improve functional mobility and facilitate squatting to pick up items from floor.    Baseline 02/25/20: AROM 12-125 degrees (was 16-105 eval)    Status Partially Met             PT Long Term Goals - 02/25/20 1626      PT LONG TERM GOAL #1   Title Patient will have LT knee AROM 0-120 degrees to improve functional mobility and facilitate squatting to pick up items from floor.    Baseline 02/25/20: AROM 12-125 degrees (was 16-105 eval)      PT LONG TERM GOAL #2   Title Patient will report at least 75% overall improvement in subjective complaint to indicate improvement in ability to perform ADLs.    Baseline 02/25/20: reports 80% improvement, feels improvements with independent functional activities and improved gait mechanics    Status Partially Met      PT LONG TERM GOAL #3   Title Patient will be able to perform stand x 5 in < 10 seconds to demonstrate improvement in functional mobility and reduced risk for falls.    Baseline 02/25/20: 5 STS no HHA 11.92" increased WB Rt>Lt    Status On-going      PT LONG TERM GOAL #4   Title Patient will be able to ambulate at least 300 feet during 2MWT with LRAD to demonstrate improved ability to perform functional mobility and associated tasks.    Baseline 5/1: intermittently use SPC in her home but currently on [facility] outside                  Plan - 03/06/20 1811    Clinical Impression Statement Pt demonstrates weakness with quadriceps required verbal and tactile cueing to improve contraction.  Able to complete with good set following cueing.  Encouraged to increased quad sets to 10 every hour.  AROM lacking 14 degrees from neutral.  Manual complete to address edema proximal knee for edema and pain control.    Examination-Activity Limitations Stairs;Squat;Dressing;Transfers;Locomotion Level;Bend;Lift;Stand    Examination-Participation Restrictions Laundry;Cleaning;Yard  Work;Community Activity    Clinical Decision Making Low    Rehab Potential Good    PT Frequency 2x / week    PT  Duration 6 weeks    PT Treatment/Interventions ADLs/Self Care Home Management;Aquatic Therapy;Biofeedback;Cryotherapy;Parrafin;Fluidtherapy;Therapeutic activities;Functional mobility training;Patient/family education;Ultrasound;Traction;Moist Heat;Electrical Stimulation;Contrast Bath;Therapeutic exercise;DME Instruction;Iontophoresis 40m/ml Dexamethasone;Gait training;Stair training;Spinal Manipulations;Energy conservation;Splinting;Dry needling;Joint Manipulations;Taping;Vasopneumatic Device;Compression bandaging;Scar mobilization;Passive range of motion;Neuromuscular re-education;Balance training;Orthotic Fit/Training;Manual techniques;Manual lymph drainage    PT Next Visit Plan Quad strengthening.  Progress LE strength and ROM on mat as tolerated. Progress to standing funcitonal strength, gait and balance activity as able.    PT Home Exercise Plan 02/11/20: quad set, heel slide, glute set, heel prop; 10/15: quad sets 10 every hour    Consulted and Agree with Plan of Care Patient           Patient will benefit from skilled therapeutic intervention in order to improve the following deficits and impairments:  Pain, Improper body mechanics, Increased fascial restricitons, Abnormal gait, Impaired flexibility, Increased edema, Difficulty walking, Decreased balance, Decreased activity tolerance, Decreased range of motion, Decreased strength, Hypomobility  Visit Diagnosis: Left knee pain, unspecified chronicity  Stiffness of left knee, not elsewhere classified  Other abnormalities of gait and mobility  Muscle weakness (generalized)     Problem List Patient Active Problem List   Diagnosis Date Noted  . Closed fracture of right ankle 12/25/2018 03/21/2019  . Primary osteoarthritis of left knee 03/14/2019  . Acute metabolic encephalopathy 050/53/9767 . Altered mental status   .  Seizure-like activity ([place]   . Hypokalemia   . Elevated glucose level   . Prolonged Q-T interval on ECG   . IBS (irritable bowel syndrome) 06/06/2018  . History of colon polyps 06/06/2018  . History of total right knee replacement 06/28/2017  . Primary localized osteoarthrosis, lower leg 02/20/2017  . Acute respiratory distress 07/07/2016  . Asthma exacerbation 07/07/2016  . Sinus tachycardia 10/22/2015  . Acute exacerbation of chronic obstructive pulmonary disease (COPD) ([place] 10/21/2015  . Anxiety 10/21/2015  . GERD (gastroesophageal reflux disease) 10/21/2015  . Chronic pain 10/21/2015  . Hyperlipidemia 10/21/2015  . Acute respiratory failure with hypoxia ([place] 02/18/2015  . COPD (chronic obstructive pulmonary disease) ([place] 02/16/2015  . COPD exacerbation ([place] 02/16/2015  . Mycoplasma pneumonia 02/16/2015  . Right knee pain 03/13/2013  . High risk medication use 02/14/2013  . Spondylolisthesis of lumbosacral region 02/14/2013  . Chronic pain syndrome 02/14/2013  . Seizure ([place] 02/22/2012  . Bleeding hemorrhoid 02/22/2012  . Acute encephalopathy 02/21/2012  . Asthma 02/21/2012  . Depression 02/21/2012   CIhor Austin LPTA/CLT; CBIS [phone] CAldona Lento10/15/2021, [address] [address]Phone: [phone]  Fax:  [phone] Name: Theresa MCGRORYMRN: [id number] of Birth: [birth year]

## 2020-03-10 ENCOUNTER — Other Ambulatory Visit: Payer: Self-pay

## 2020-03-10 ENCOUNTER — Ambulatory Visit (HOSPITAL_COMMUNITY): Payer: Medicaid Other | Admitting: Physical Therapy

## 2020-03-10 ENCOUNTER — Encounter (HOSPITAL_COMMUNITY): Payer: Self-pay | Admitting: Physical Therapy

## 2020-03-10 DIAGNOSIS — M25562 Pain in left knee: Secondary | ICD-10-CM

## 2020-03-10 DIAGNOSIS — R2689 Other abnormalities of gait and mobility: Secondary | ICD-10-CM

## 2020-03-10 DIAGNOSIS — M6281 Muscle weakness (generalized): Secondary | ICD-10-CM

## 2020-03-10 DIAGNOSIS — M25662 Stiffness of left knee, not elsewhere classified: Secondary | ICD-10-CM

## 2020-03-11 NOTE — Therapy (Signed)
[facility] [place], [place], [address] Phone: [phone]   Fax:  [phone]  Physical Therapy Treatment  Patient Details  Name: Theresa Pace MRN: [id number] Date of Birth: [birth year] Referring Provider (PT): Remo Lipps Case MD    Encounter Date: 03/10/2020   PT End of Session - 03/10/20 1656    Visit Number 5    Number of Visits 12    Date for PT Re-Evaluation 03/27/20    Authorization Type Medicaid [place] Time Period 10/11-->03/29/20    Authorization - Visit Number 2    Authorization - Number of Visits 8    Progress Note Due on Visit 11    PT Start Time 9024    PT Stop Time 1730    PT Time Calculation (min) 40 min    Activity Tolerance Patient limited by pain;Patient limited by fatigue    Behavior During Therapy [facility] for tasks assessed/performed           Past Medical History:  Diagnosis Date  . Arthritis   . Asthma   . COPD (chronic obstructive pulmonary disease) ([place])   . Depression   . Fibromyalgia   . Frequent urination at night   . GERD (gastroesophageal reflux disease)   . Hypercholesteremia   . IBS (irritable bowel syndrome)   . Tachycardia    treated with Cardizem  . Tendonitis     Past Surgical History:  Procedure Laterality Date  . ABDOMINAL HYSTERECTOMY    . APPENDECTOMY    . KNEE ARTHROPLASTY Right 05/23/2017    There were no vitals filed for this visit.   Subjective Assessment - 03/10/20 1655    Subjective Patient says she is doing the exercises. Says she still has pain that comes and goes.    Pertinent History COPD, RT TKA 2019    Patient Stated Goals Be able to walk and bend my knee    Currently in Pain? Yes    Pain Score 7     Pain Location Knee    Pain Orientation Left;Lateral    Pain Descriptors / Indicators Sharp    Pain Type Surgical pain    Pain Onset 1 to 4 weeks ago              03/10/20 0001  Knee/Hip Exercises: Stretches  Gastroc Stretch 3  reps;30 seconds  Gastroc Stretch Limitations slant board  Knee/Hip Exercises: Standing  Gait Training 25 feet with RW; 25 feet with SBQC, cues for sequencing, stride length   Knee/Hip Exercises: Supine  Quad Sets Left;1 set;10 reps  Quad Sets Limitations 5" hold  Heel Slides Left;10 reps  Knee Extension AROM  Knee Extension Limitations 14 lacking  Knee Flexion AROM  Knee Flexion Limitations 125  Other Supine Knee/Hip Exercises supine knee extension with therapist assist overpressure 10x10" holds   Straight Leg Raises Left;10 reps  Heel Prop for Knee Extension 2 minutes  Bridges Both;10 reps      PT Short Term Goals - 02/25/20 1622      PT SHORT TERM GOAL #1   Title Patient will be independent with initial HEP and self-management strategies to improve functional outcomes    Baseline 02/25/20:  Reports compliance with HEP 2x daily    Status On-going      PT SHORT TERM GOAL #2   Title Patient will have LT knee AROM 5-110 degrees to improve functional mobility and facilitate squatting to pick up items from floor.  Baseline 02/25/20: AROM 12-125 degrees (was 16-105 eval)    Status Partially Met             PT Long Term Goals - 02/25/20 1626      PT LONG TERM GOAL #1   Title Patient will have LT knee AROM 0-120 degrees to improve functional mobility and facilitate squatting to pick up items from floor.    Baseline 02/25/20: AROM 12-125 degrees (was 16-105 eval)      PT LONG TERM GOAL #2   Title Patient will report at least 75% overall improvement in subjective complaint to indicate improvement in ability to perform ADLs.    Baseline 02/25/20: reports 80% improvement, feels improvements with independent functional activities and improved gait mechanics    Status Partially Met      PT LONG TERM GOAL #3   Title Patient will be able to perform stand x 5 in < 10 seconds to demonstrate improvement in functional mobility and reduced risk for falls.    Baseline 02/25/20: 5 STS no  HHA 11.92" increased WB Rt>Lt    Status On-going      PT LONG TERM GOAL #4   Title Patient will be able to ambulate at least 300 feet during 2MWT with LRAD to demonstrate improved ability to perform functional mobility and associated tasks.    Baseline 5/1: intermittently use SPC in her home but currently on [facility] outside                  Plan - 03/10/20 1650    Clinical Impression Statement Patient continues to be significantly limited by knee extension AROM and very poor quadricep activity. Educated patient on importance of compliance with HEP for addressing these issues. Educated patient on importance of these 2 functions for restoring normalized gait. Patient verbalized understanding. Initiated gait training with quad cane. Patient did fairly well but was limited by fatigue. Instructed patient to wear clothing allowing access to quadriceps for NMES trial next visit for facilitated quad activation.    Examination-Activity Limitations Stairs;Squat;Dressing;Transfers;Locomotion Level;Bend;Lift;Stand    Examination-Participation Restrictions Laundry;Cleaning;Yard Work;Community Activity    Rehab Potential Good    PT Frequency 2x / week    PT Duration 6 weeks    PT Treatment/Interventions ADLs/Self Care Home Management;Aquatic Therapy;Biofeedback;Cryotherapy;Parrafin;Fluidtherapy;Therapeutic activities;Functional mobility training;Patient/family education;Ultrasound;Traction;Moist Heat;Electrical Stimulation;Contrast Bath;Therapeutic exercise;DME Instruction;Iontophoresis 74m/ml Dexamethasone;Gait training;Stair training;Spinal Manipulations;Energy conservation;Splinting;Dry needling;Joint Manipulations;Taping;Vasopneumatic Device;Compression bandaging;Scar mobilization;Passive range of motion;Neuromuscular re-education;Balance training;Orthotic Fit/Training;Manual techniques;Manual lymph drainage    PT Next Visit Plan Quad strengthening. NMES trial next visit for facilitated quad activation.   Progress LE strength and ROM on mat as tolerated. Progress to standing funcitonal strength, gait and balance activity as able.    PT Home Exercise Plan 02/11/20: quad set, heel slide, glute set, heel prop; 10/15: quad sets 10 every hour    Consulted and Agree with Plan of Care Patient           Patient will benefit from skilled therapeutic intervention in order to improve the following deficits and impairments:  Pain, Improper body mechanics, Increased fascial restricitons, Abnormal gait, Impaired flexibility, Increased edema, Difficulty walking, Decreased balance, Decreased activity tolerance, Decreased range of motion, Decreased strength, Hypomobility  Visit Diagnosis: Left knee pain, unspecified chronicity  Stiffness of left knee, not elsewhere classified  Other abnormalities of gait and mobility  Muscle weakness (generalized)     Problem List Patient Active Problem List   Diagnosis Date Noted  . Closed fracture of right ankle 12/25/2018 03/21/2019  .  Primary osteoarthritis of left knee 03/14/2019  . Acute metabolic encephalopathy 41/71/2787  . Altered mental status   . Seizure-like activity ([place])   . Hypokalemia   . Elevated glucose level   . Prolonged Q-T interval on ECG   . IBS (irritable bowel syndrome) 06/06/2018  . History of colon polyps 06/06/2018  . History of total right knee replacement 06/28/2017  . Primary localized osteoarthrosis, lower leg 02/20/2017  . Acute respiratory distress 07/07/2016  . Asthma exacerbation 07/07/2016  . Sinus tachycardia 10/22/2015  . Acute exacerbation of chronic obstructive pulmonary disease (COPD) ([place]) 10/21/2015  . Anxiety 10/21/2015  . GERD (gastroesophageal reflux disease) 10/21/2015  . Chronic pain 10/21/2015  . Hyperlipidemia 10/21/2015  . Acute respiratory failure with hypoxia ([place]) 02/18/2015  . COPD (chronic obstructive pulmonary disease) ([place]) 02/16/2015  . COPD exacerbation ([place]) 02/16/2015  . Mycoplasma pneumonia  02/16/2015  . Right knee pain 03/13/2013  . High risk medication use 02/14/2013  . Spondylolisthesis of lumbosacral region 02/14/2013  . Chronic pain syndrome 02/14/2013  . Seizure ([place]) 02/22/2012  . Bleeding hemorrhoid 02/22/2012  . Acute encephalopathy 02/21/2012  . Asthma 02/21/2012  . Depression 02/21/2012   1:45 PM, 03/11/20 Josue Hector PT DPT  Physical Therapist with [facility]  [phone] [place], [place], [address] Phone: [phone]   Fax:  [phone]  Name: Theresa Pace MRN: [id number] Date of Birth: 1960/07/29

## 2020-03-13 ENCOUNTER — Ambulatory Visit (HOSPITAL_COMMUNITY): Payer: Medicaid Other

## 2020-03-13 ENCOUNTER — Telehealth (HOSPITAL_COMMUNITY): Payer: Self-pay

## 2020-03-13 NOTE — Telephone Encounter (Signed)
pt cancelled because they are in the middle of moving

## 2020-03-17 ENCOUNTER — Telehealth (HOSPITAL_COMMUNITY): Payer: Self-pay | Admitting: Physical Therapy

## 2020-03-17 ENCOUNTER — Encounter (HOSPITAL_COMMUNITY): Payer: Self-pay | Admitting: Physical Therapy

## 2020-03-17 ENCOUNTER — Ambulatory Visit (HOSPITAL_COMMUNITY): Payer: Medicaid Other

## 2020-03-17 NOTE — Therapy (Signed)
[place], [place], [address] Phone: [phone]   Fax:  [phone]  Patient Details  Name: Theresa Pace MRN: [id number] Date of Birth: [birth year] Referring Provider:  Remo Lipps Case MD  Encounter Date: 03/17/2020  PHYSICAL THERAPY DISCHARGE SUMMARY  Visits from Start of Care: 5  Current functional level related to goals / functional outcomes: Patient not available to formal reassessment. Patient transferred to another therapy facility. Patient being DC from this facility per request.    Remaining deficits: Unable to complete formal reassess    Education / Equipment: Patient DC per request, to transfer to another therapy facility  Plan:                                                    Patient goals were not met. Patient is being discharged due to the patient's request.  ?????      5:26 PM, 03/17/20 Josue Hector PT DPT  Physical Therapist with [facility]  [phone] [place] [address], [place], [address] Phone: [phone]   Fax:  [phone]

## 2020-03-17 NOTE — Telephone Encounter (Signed)
Pt states her MD took her out of our office and she will be going to [place], [place] to get her PT. Please cx and d/c her

## 2020-03-19 ENCOUNTER — Ambulatory Visit (HOSPITAL_COMMUNITY): Payer: Medicaid Other | Admitting: Physical Therapy

## 2020-03-20 ENCOUNTER — Encounter (HOSPITAL_COMMUNITY): Payer: Medicaid Other

## 2020-03-24 ENCOUNTER — Ambulatory Visit (HOSPITAL_COMMUNITY): Payer: Medicaid Other | Admitting: Physical Therapy

## 2020-03-26 ENCOUNTER — Encounter (HOSPITAL_COMMUNITY): Payer: Medicaid Other | Admitting: Physical Therapy

## 2020-06-24 ENCOUNTER — Other Ambulatory Visit: Payer: Self-pay | Admitting: Family Medicine

## 2020-06-24 DIAGNOSIS — R921 Mammographic calcification found on diagnostic imaging of breast: Secondary | ICD-10-CM

## 2020-07-08 ENCOUNTER — Ambulatory Visit
Admission: RE | Admit: 2020-07-08 | Discharge: 2020-07-08 | Disposition: A | Discharge status: Home / Self Care | Payer: Medicaid Other | Source: Ambulatory Visit | Attending: Family Medicine | Admitting: Family Medicine

## 2020-07-08 ENCOUNTER — Other Ambulatory Visit: Payer: Self-pay

## 2020-07-08 ENCOUNTER — Other Ambulatory Visit: Payer: Self-pay | Admitting: Diagnostic Radiology

## 2020-07-08 DIAGNOSIS — R921 Mammographic calcification found on diagnostic imaging of breast: Secondary | ICD-10-CM

## 2020-11-30 ENCOUNTER — Ambulatory Visit: Payer: Medicaid Other | Admitting: Podiatry

## 2020-12-07 ENCOUNTER — Other Ambulatory Visit: Payer: Self-pay

## 2020-12-07 ENCOUNTER — Ambulatory Visit
Admission: EM | Admit: 2020-12-07 | Discharge: 2020-12-07 | Disposition: A | Discharge status: Home / Self Care | Payer: Medicaid Other | Attending: Family Medicine | Admitting: Family Medicine

## 2020-12-07 ENCOUNTER — Ambulatory Visit (INDEPENDENT_AMBULATORY_CARE_PROVIDER_SITE_OTHER): Payer: Medicaid Other

## 2020-12-07 ENCOUNTER — Encounter: Payer: Self-pay | Admitting: Emergency Medicine

## 2020-12-07 DIAGNOSIS — M25572 Pain in left ankle and joints of left foot: Secondary | ICD-10-CM | POA: Diagnosis not present

## 2020-12-07 DIAGNOSIS — M79672 Pain in left foot: Secondary | ICD-10-CM | POA: Diagnosis not present

## 2020-12-07 DIAGNOSIS — S93402A Sprain of unspecified ligament of left ankle, initial encounter: Secondary | ICD-10-CM | POA: Diagnosis not present

## 2020-12-07 MED ORDER — PREDNISONE 20 MG PO TABS: 40.0000 mg | ORAL_TABLET | Freq: Every day | ORAL | 0 refills | Status: DC

## 2020-12-07 NOTE — ED Provider Notes (Signed)
[facility]    CSN: [id number] Arrival date & time: 12/07/20  1723      History   Chief Complaint No chief complaint on file.   HPI Theresa Pace is a 60 y.o. female.   HPI Patient presents today for evaluation of left ankle pain after sustaining a near fall resulting in twisting her left ankle and foot.  Injury occurred about 3 hours ago.  She has diffuse swelling involving the ankle and the foot and pain with weightbearing. She endorses stepping off of her porch and rolling her ankle and developing severe pain and swelling of left ankle or foot. Past Medical History:  Diagnosis Date   Arthritis    Asthma    COPD (chronic obstructive pulmonary disease) ([facility])    Depression    Fibromyalgia    Frequent urination at night    GERD (gastroesophageal reflux disease)    Hypercholesteremia    IBS (irritable bowel syndrome)    Tachycardia    treated with Cardizem   Tendonitis     Patient Active Problem List   Diagnosis Date Noted   Closed fracture of right ankle 12/25/2018 03/21/2019   Primary osteoarthritis of left knee 45/62/5638   Acute metabolic encephalopathy 93/73/4287   Altered mental status    Seizure-like activity ([facility])    Hypokalemia    Elevated glucose level    Prolonged Q-T interval on ECG    IBS (irritable bowel syndrome) 06/06/2018   History of colon polyps 06/06/2018   History of total right knee replacement 06/28/2017   Primary localized osteoarthrosis, lower leg 02/20/2017   Acute respiratory distress 07/07/2016   Asthma exacerbation 07/07/2016   Sinus tachycardia 10/22/2015   Acute exacerbation of chronic obstructive pulmonary disease (COPD) ([place]) 10/21/2015   Anxiety 10/21/2015   GERD (gastroesophageal reflux disease) 10/21/2015   Chronic pain 10/21/2015   Hyperlipidemia 10/21/2015   Acute respiratory failure with hypoxia ([place]) 02/18/2015   COPD (chronic obstructive pulmonary disease) ([place]) 02/16/2015   COPD exacerbation ([place]) 02/16/2015    Mycoplasma pneumonia 02/16/2015   Right knee pain 03/13/2013   High risk medication use 02/14/2013   Spondylolisthesis of lumbosacral region 02/14/2013   Chronic pain syndrome 02/14/2013   Seizure ([place]) 02/22/2012   Bleeding hemorrhoid 02/22/2012   Acute encephalopathy 02/21/2012   Asthma 02/21/2012   Depression 02/21/2012    Past Surgical History:  Procedure Laterality Date   ABDOMINAL HYSTERECTOMY     APPENDECTOMY     KNEE ARTHROPLASTY Right 05/23/2017    OB History   No obstetric history on file.      Home Medications    Prior to Admission medications   Medication Sig Start Date End Date Taking? Authorizing Provider  albuterol (PROVENTIL HFA;VENTOLIN HFA) 108 (90 BASE) MCG/ACT inhaler Inhale 2 puffs into the lungs every 6 (six) hours as needed for wheezing or shortness of breath.     [provider]  amphetamine-dextroamphetamine (ADDERALL) 30 MG tablet Take 30 mg by mouth 3 (three) times daily.    [provider]  aspirin EC 81 MG tablet Take 81 mg by mouth daily.    [provider]  baclofen (LIORESAL) 10 MG tablet Take 10 mg by mouth 2 (two) times daily.    [provider]  budesonide (PULMICORT) 0.5 MG/2ML nebulizer solution Take 2 mLs (0.5 mg total) by nebulization daily. Patient taking differently: Take 0.5 mg by nebulization 4 (four) times daily as needed (for shortness of breath or wheezing).  06/27/16  Isla Pence, MD  dicyclomine (BENTYL) 10 MG capsule Take 1 capsule (10 mg total) by mouth 4 (four) times daily as needed for spasms (or diarrhea). 10/25/18   Carlis Stable, NP  diltiazem (DILACOR XR) 120 MG 24 hr capsule Take 120 mg by mouth daily.    [provider]  HYDROcodone-acetaminophen (NORCO/VICODIN) 5-325 MG tablet Take 1 tablet by mouth 2 (two) times daily.    [provider]  ipratropium-albuterol (DUONEB) 0.5-2.5 (3) MG/3ML SOLN Take 3 mLs by nebulization 4 (four) times daily. Patient taking  differently: Take 3 mLs by nebulization 3 (three) times daily.  02/20/15   Samuella Cota, MD  metoprolol tartrate (LOPRESSOR) 100 MG tablet Take Two Hours Prior to CT Scan. 06/07/19   Fay Records, MD  pantoprazole (PROTONIX) 40 MG tablet Take 1 tablet (40 mg total) by mouth 2 (two) times daily before a meal. 07/11/16   Orvan Falconer, MD  risperiDONE (RISPERDAL) 0.5 MG tablet Take 0.5 mg by mouth 2 (two) times daily.    [provider]  rosuvastatin (CRESTOR) 20 MG tablet Take 20 mg by mouth daily.    [provider]  solifenacin (VESICARE) 5 MG tablet Take 5 mg by mouth daily.    [provider]  traZODone (DESYREL) 50 MG tablet Take 150 mg by mouth at bedtime.     [provider]    Family History Family History  Problem Relation Age of Onset   Diabetes Brother    Colon cancer Neg Hx     Social History Social History   Tobacco Use   Smoking status: Former    Packs/day: 0.25    Types: Cigarettes    Quit date: 05/07/2019    Years since quitting: 1.5   Smokeless tobacco: Never  Vaping Use   Vaping Use: Never used  Substance Use Topics   Alcohol use: No   Drug use: No     Allergies   Methadone and Aspirin   Review of Systems Review of Systems Pertinent negatives listed in HPI   Physical Exam Triage Vital Signs ED Triage Vitals  Enc Vitals Group     BP 12/07/20 1803 137/83     Pulse Rate 12/07/20 1803 (!) 112     Resp 12/07/20 1803 16     Temp 12/07/20 1803 98.4 F (36.9 C)     Temp Source 12/07/20 1803 Tympanic     SpO2 12/07/20 1803 92 %     Weight --      Height --      Head Circumference --      Peak Flow --      Pain Score 12/07/20 1809 7     Pain Loc --      Pain Edu? --      Excl. in [place]? --    No data found.  Updated Vital Signs BP 137/83 (BP Location: Right Arm)   Pulse (!) 112   Temp 98.4 F (36.9 C) (Tympanic)   Resp 16   SpO2 92%   Visual Acuity Right Eye Distance:   Left Eye Distance:   Bilateral  Distance:    Right Eye Near:   Left Eye Near:    Bilateral Near:     Physical Exam HENT:     Head: Normocephalic.  Eyes:     Pupils: Pupils are equal, round, and reactive to light.  Cardiovascular:     Rate and Rhythm: Normal rate and regular rhythm.  Pulmonary:  Effort: Pulmonary effort is normal.     Breath sounds: Normal breath sounds.  Musculoskeletal:     Cervical back: Normal range of motion.       Legs:  Skin:    General: Skin is warm.     Capillary Refill: Capillary refill takes less than 2 seconds.  Neurological:     General: No focal deficit present.     Mental Status: She is alert and oriented to person, place, and time.  Psychiatric:        Mood and Affect: Mood normal.        Behavior: Behavior normal.        Thought Content: Thought content normal.        Judgment: Judgment normal.     UC Treatments / Results  Labs (all labs ordered are listed, but only abnormal results are displayed) Labs Reviewed - No data to display  EKG   Radiology DG Ankle Complete Left  Result Date: 12/07/2020 CLINICAL DATA:  Left foot and ankle pain after injury today EXAM: LEFT ANKLE COMPLETE - 3+ VIEW; LEFT FOOT - COMPLETE 3+ VIEW COMPARISON:  None. FINDINGS: No acute fracture or dislocation of the left ankle. Ankle mortise is congruent without widening. Large os trigonum. Pes cavovarus alignment. No evidence of acute fracture or dislocation within the left foot. Mild soft tissue prominence diffusely. IMPRESSION: No acute fracture or dislocation of the left foot or ankle. Electronically Signed   By: Davina Poke D.O.   On: 12/07/2020 18:42   DG Foot Complete Left  Result Date: 12/07/2020 CLINICAL DATA:  Left foot and ankle pain after injury today EXAM: LEFT ANKLE COMPLETE - 3+ VIEW; LEFT FOOT - COMPLETE 3+ VIEW COMPARISON:  None. FINDINGS: No acute fracture or dislocation of the left ankle. Ankle mortise is congruent without widening. Large os trigonum. Pes cavovarus  alignment. No evidence of acute fracture or dislocation within the left foot. Mild soft tissue prominence diffusely. IMPRESSION: No acute fracture or dislocation of the left foot or ankle. Electronically Signed   By: Davina Poke D.O.   On: 12/07/2020 18:42    Procedures Procedures (including critical care time)  Medications Ordered in UC Medications - No data to display  Initial Impression / Assessment and Plan / UC Course  I have reviewed the triage vital signs and the nursing notes.  Pertinent labs & imaging results that were available during my care of the patient were reviewed by me and considered in my medical decision making (see chart for details).    Ankle lace-up applied to left extremity. Ambulate with crutches and remain nonweightbearing until able to tolerate walking without pain. If no improvement after completion of prednisone follow-up with orthopedics. Final Clinical Impressions(s) / UC Diagnoses   Final diagnoses:  Sprain of left ankle, unspecified ligament, initial encounter     Discharge Instructions      Advance activity as tolerated. Until pain resolves, continue ankle lace up and crutches. If you have not achieved the ability to walk with out pain following completion of steroids, follow-up with orthopedics. For pain extra strength tylenol every 4-6 hours as needed.    ED Prescriptions     Medication Sig Dispense Auth. Provider   predniSONE (DELTASONE) 20 MG tablet Take 2 tablets (40 mg total) by mouth daily with breakfast. 10 tablet Scot Jun, FNP      PDMP not reviewed this encounter.   Scot Jun, FNP 12/10/20 [phone]

## 2020-12-07 NOTE — Discharge Instructions (Addendum)
Advance activity as tolerated. Until pain resolves, continue ankle lace up and crutches. If you have not achieved the ability to walk with out pain following completion of steroids, follow-up with orthopedics. For pain extra strength tylenol every 4-6 hours as needed.

## 2020-12-07 NOTE — ED Triage Notes (Signed)
Rolled ankle when going out to hang clothes today

## 2020-12-09 ENCOUNTER — Encounter: Payer: Self-pay | Admitting: Podiatry

## 2020-12-09 ENCOUNTER — Ambulatory Visit: Payer: Medicaid Other | Admitting: Podiatry

## 2020-12-09 ENCOUNTER — Other Ambulatory Visit: Payer: Self-pay

## 2020-12-09 DIAGNOSIS — L600 Ingrowing nail: Secondary | ICD-10-CM

## 2020-12-10 NOTE — Progress Notes (Signed)
Subjective:   Patient ID: Theresa Pace, female   DOB: 60 y.o.   MRN: [id number]   HPI Patient presents stating she has severely damaged nailbeds 1 and 2 right that are very painful and she cannot cut them with mild changes of adjacent nails.  She states that these have gotten worse over the years and are making it increasingly hard to cut or to wear shoe gear comfortably and she presents with caregiver today.  Does not smoke would like to be active   Review of Systems  All other systems reviewed and are negative.      Objective:  Physical Exam Vitals and nursing note reviewed.  Constitutional:      Appearance: She is well-developed.  Pulmonary:     Effort: Pulmonary effort is normal.  Musculoskeletal:        General: Normal range of motion.  Skin:    General: Skin is warm.  Neurological:     Mental Status: She is alert.    Neurovascular status intact muscle strength was found to be adequate range of motion adequate.  Patient is noted to have severe thickness of the right hallux and second nails with dystrophic changes and pain when pressed from a dorsal direction with slight change in other nails but nowhere near to the degree.  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  I explained procedure risk and that allowed her to sign and read consent form which she understands risks.  Today I infiltrated each toe 60 mg like Marcaine mixture sterile prep was done and using sterile instrumentation I remove the hallux and second nails exposed matrix and applied phenol 5 applications 30 seconds followed by alcohol lavage sterile dressing and gave instructions on soaks and open toed shoes.  Leave dressings on 24 hours but take off earlier if any throbbing  were to occur and encouraged her to call with acute concerns which may arise

## 2020-12-15 ENCOUNTER — Emergency Department (HOSPITAL_COMMUNITY): Payer: Medicaid Other

## 2020-12-15 ENCOUNTER — Other Ambulatory Visit: Payer: Self-pay

## 2020-12-15 ENCOUNTER — Encounter (HOSPITAL_COMMUNITY): Payer: Self-pay

## 2020-12-15 ENCOUNTER — Inpatient Hospital Stay (HOSPITAL_COMMUNITY)
Admission: EM | Admit: 2020-12-15 | Discharge: 2020-12-16 | DRG: 100 | Disposition: A | Discharge status: Home / Self Care | Payer: Medicaid Other | Attending: Internal Medicine | Admitting: Internal Medicine

## 2020-12-15 ENCOUNTER — Inpatient Hospital Stay (HOSPITAL_COMMUNITY): Payer: Medicaid Other

## 2020-12-15 DIAGNOSIS — R569 Unspecified convulsions: Secondary | ICD-10-CM

## 2020-12-15 DIAGNOSIS — Z885 Allergy status to narcotic agent status: Secondary | ICD-10-CM | POA: Diagnosis not present

## 2020-12-15 DIAGNOSIS — Z20822 Contact with and (suspected) exposure to covid-19: Secondary | ICD-10-CM | POA: Diagnosis present

## 2020-12-15 DIAGNOSIS — Z886 Allergy status to analgesic agent status: Secondary | ICD-10-CM

## 2020-12-15 DIAGNOSIS — G8929 Other chronic pain: Secondary | ICD-10-CM | POA: Diagnosis present

## 2020-12-15 DIAGNOSIS — K589 Irritable bowel syndrome without diarrhea: Secondary | ICD-10-CM | POA: Diagnosis present

## 2020-12-15 DIAGNOSIS — D751 Secondary polycythemia: Secondary | ICD-10-CM | POA: Diagnosis present

## 2020-12-15 DIAGNOSIS — Z7982 Long term (current) use of aspirin: Secondary | ICD-10-CM

## 2020-12-15 DIAGNOSIS — Z79899 Other long term (current) drug therapy: Secondary | ICD-10-CM

## 2020-12-15 DIAGNOSIS — Z7951 Long term (current) use of inhaled steroids: Secondary | ICD-10-CM

## 2020-12-15 DIAGNOSIS — Z9114 Patient's other noncompliance with medication regimen: Secondary | ICD-10-CM

## 2020-12-15 DIAGNOSIS — F32A Depression, unspecified: Secondary | ICD-10-CM | POA: Diagnosis present

## 2020-12-15 DIAGNOSIS — E7800 Pure hypercholesterolemia, unspecified: Secondary | ICD-10-CM | POA: Diagnosis present

## 2020-12-15 DIAGNOSIS — R000 Tachycardia, unspecified: Secondary | ICD-10-CM | POA: Diagnosis present

## 2020-12-15 DIAGNOSIS — G40909 Epilepsy, unspecified, not intractable, without status epilepticus: Secondary | ICD-10-CM | POA: Diagnosis present

## 2020-12-15 DIAGNOSIS — G9340 Encephalopathy, unspecified: Secondary | ICD-10-CM | POA: Diagnosis present

## 2020-12-15 DIAGNOSIS — M797 Fibromyalgia: Secondary | ICD-10-CM | POA: Diagnosis present

## 2020-12-15 DIAGNOSIS — F1120 Opioid dependence, uncomplicated: Secondary | ICD-10-CM | POA: Diagnosis present

## 2020-12-15 DIAGNOSIS — Z96651 Presence of right artificial knee joint: Secondary | ICD-10-CM | POA: Diagnosis present

## 2020-12-15 DIAGNOSIS — Z87891 Personal history of nicotine dependence: Secondary | ICD-10-CM | POA: Diagnosis not present

## 2020-12-15 DIAGNOSIS — M1990 Unspecified osteoarthritis, unspecified site: Secondary | ICD-10-CM | POA: Diagnosis present

## 2020-12-15 DIAGNOSIS — K219 Gastro-esophageal reflux disease without esophagitis: Secondary | ICD-10-CM | POA: Diagnosis present

## 2020-12-15 DIAGNOSIS — E785 Hyperlipidemia, unspecified: Secondary | ICD-10-CM | POA: Diagnosis present

## 2020-12-15 DIAGNOSIS — Z833 Family history of diabetes mellitus: Secondary | ICD-10-CM | POA: Diagnosis not present

## 2020-12-15 DIAGNOSIS — G253 Myoclonus: Secondary | ICD-10-CM | POA: Diagnosis not present

## 2020-12-15 DIAGNOSIS — I10 Essential (primary) hypertension: Secondary | ICD-10-CM | POA: Diagnosis present

## 2020-12-15 DIAGNOSIS — G9341 Metabolic encephalopathy: Secondary | ICD-10-CM | POA: Diagnosis present

## 2020-12-15 DIAGNOSIS — R4182 Altered mental status, unspecified: Secondary | ICD-10-CM | POA: Diagnosis not present

## 2020-12-15 DIAGNOSIS — J449 Chronic obstructive pulmonary disease, unspecified: Secondary | ICD-10-CM | POA: Diagnosis present

## 2020-12-15 DIAGNOSIS — J45909 Unspecified asthma, uncomplicated: Secondary | ICD-10-CM | POA: Diagnosis present

## 2020-12-15 DIAGNOSIS — F323 Major depressive disorder, single episode, severe with psychotic features: Secondary | ICD-10-CM | POA: Diagnosis present

## 2020-12-15 DIAGNOSIS — D72829 Elevated white blood cell count, unspecified: Secondary | ICD-10-CM | POA: Diagnosis present

## 2020-12-15 LAB — URINALYSIS, ROUTINE W REFLEX MICROSCOPIC
Bacteria, UA: NONE SEEN
Bilirubin Urine: NEGATIVE
Glucose, UA: NEGATIVE mg/dL
Ketones, ur: NEGATIVE mg/dL
Leukocytes,Ua: NEGATIVE
Nitrite: NEGATIVE
Protein, ur: NEGATIVE mg/dL
Specific Gravity, Urine: 1.021 (ref 1.005–1.030)
pH: 5 (ref 5.0–8.0)

## 2020-12-15 LAB — CBC WITH DIFFERENTIAL/PLATELET
Abs Immature Granulocytes: 0.19 10*3/uL — ABNORMAL HIGH (ref 0.00–0.07)
Basophils Absolute: 0.1 10*3/uL (ref 0.0–0.1)
Basophils Relative: 0 %
Eosinophils Absolute: 0.2 10*3/uL (ref 0.0–0.5)
Eosinophils Relative: 1 %
HCT: 49.5 % — ABNORMAL HIGH (ref 36.0–46.0)
Hemoglobin: 16.3 g/dL — ABNORMAL HIGH (ref 12.0–15.0)
Immature Granulocytes: 1 %
Lymphocytes Relative: 13 %
Lymphs Abs: 3.7 10*3/uL (ref 0.7–4.0)
MCH: 31.3 pg (ref 26.0–34.0)
MCHC: 32.9 g/dL (ref 30.0–36.0)
MCV: 95 fL (ref 80.0–100.0)
Monocytes Absolute: 1.7 10*3/uL — ABNORMAL HIGH (ref 0.1–1.0)
Monocytes Relative: 6 %
Neutro Abs: 23.1 10*3/uL — ABNORMAL HIGH (ref 1.7–7.7)
Neutrophils Relative %: 79 %
Platelets: 318 10*3/uL (ref 150–400)
RBC: 5.21 MIL/uL — ABNORMAL HIGH (ref 3.87–5.11)
RDW: 13.7 % (ref 11.5–15.5)
WBC: 29 10*3/uL — ABNORMAL HIGH (ref 4.0–10.5)
nRBC: 0 % (ref 0.0–0.2)

## 2020-12-15 LAB — COMPREHENSIVE METABOLIC PANEL
ALT: 29 U/L (ref 0–44)
AST: 21 U/L (ref 15–41)
Albumin: 4.1 g/dL (ref 3.5–5.0)
Alkaline Phosphatase: 83 U/L (ref 38–126)
Anion gap: 10 (ref 5–15)
BUN: 22 mg/dL — ABNORMAL HIGH (ref 6–20)
CO2: 26 mmol/L (ref 22–32)
Calcium: 8.4 mg/dL — ABNORMAL LOW (ref 8.9–10.3)
Chloride: 102 mmol/L (ref 98–111)
Creatinine, Ser: 0.82 mg/dL (ref 0.44–1.00)
GFR, Estimated: >60 mL/min (ref >60)
Glucose, Bld: 152 mg/dL — ABNORMAL HIGH (ref 70–99)
Potassium: 3.6 mmol/L (ref 3.5–5.1)
Sodium: 138 mmol/L (ref 135–145)
Total Bilirubin: 0.7 mg/dL (ref 0.3–1.2)
Total Protein: 3.9 g/dL — ABNORMAL LOW (ref 6.5–8.1)

## 2020-12-15 LAB — SAVE SMEAR(SSMR), FOR PROVIDER SLIDE REVIEW

## 2020-12-15 LAB — BLOOD GAS, ARTERIAL
Acid-Base Excess: 0.9 mmol/L (ref 0.0–2.0)
Bicarbonate: 24.8 mmol/L (ref 20.0–28.0)
FIO2: 36
O2 Saturation: 95.5 %
Patient temperature: 37
pCO2 arterial: 43.4 mmHg (ref 32.0–48.0)
pH, Arterial: 7.384 (ref 7.350–7.450)
pO2, Arterial: 78.9 mmHg — ABNORMAL LOW (ref 83.0–108.0)

## 2020-12-15 LAB — MRSA NEXT GEN BY PCR, NASAL: MRSA by PCR Next Gen: NOT DETECTED

## 2020-12-15 LAB — RAPID URINE DRUG SCREEN, HOSP PERFORMED
Amphetamines: NOT DETECTED
Barbiturates: NOT DETECTED
Benzodiazepines: NOT DETECTED
Cocaine: NOT DETECTED
Opiates: POSITIVE — AB
Tetrahydrocannabinol: NOT DETECTED

## 2020-12-15 LAB — CBG MONITORING, ED: Glucose-Capillary: 166 mg/dL — ABNORMAL HIGH (ref 70–99)

## 2020-12-15 LAB — RESP PANEL BY RT-PCR (FLU A&B, COVID) ARPGX2
Influenza A by PCR: NEGATIVE
Influenza B by PCR: NEGATIVE
SARS Coronavirus 2 by RT PCR: NEGATIVE

## 2020-12-15 LAB — ETHANOL: Alcohol, Ethyl (B): <10 mg/dL (ref <10)

## 2020-12-15 LAB — MAGNESIUM: Magnesium: 2.1 mg/dL (ref 1.7–2.4)

## 2020-12-15 LAB — CK: Total CK: 30 U/L — ABNORMAL LOW (ref 38–234)

## 2020-12-15 LAB — HIV ANTIBODY (ROUTINE TESTING W REFLEX): HIV Screen 4th Generation wRfx: NONREACTIVE

## 2020-12-15 LAB — LACTIC ACID, PLASMA: Lactic Acid, Venous: 1.9 mmol/L (ref 0.5–1.9)

## 2020-12-15 MED ORDER — DILTIAZEM HCL ER COATED BEADS 120 MG PO CP24
120.0000 mg | ORAL_CAPSULE | Freq: Every day | ORAL | Status: DC
  Administered 2020-12-16: 120 mg via ORAL
  Filled 2020-12-15: qty 1

## 2020-12-15 MED ORDER — UMECLIDINIUM BROMIDE 62.5 MCG/INH IN AEPB
1.0000 | INHALATION_SPRAY | Freq: Every day | RESPIRATORY_TRACT | Status: DC
  Filled 2020-12-15 (×2): qty 7

## 2020-12-15 MED ORDER — ASPIRIN EC 81 MG PO TBEC
81.0000 mg | DELAYED_RELEASE_TABLET | Freq: Every day | ORAL | Status: DC
  Administered 2020-12-15 – 2020-12-16 (×2): 81 mg via ORAL
  Filled 2020-12-15 (×2): qty 1

## 2020-12-15 MED ORDER — ROSUVASTATIN CALCIUM 20 MG PO TABS
20.0000 mg | ORAL_TABLET | Freq: Every evening | ORAL | Status: DC
  Administered 2020-12-15 – 2020-12-16 (×2): 20 mg via ORAL
  Filled 2020-12-15 (×2): qty 1

## 2020-12-15 MED ORDER — PROCHLORPERAZINE EDISYLATE 10 MG/2ML IJ SOLN: 10.0000 mg | INTRAMUSCULAR | Status: DC | PRN

## 2020-12-15 MED ORDER — IPRATROPIUM-ALBUTEROL 0.5-2.5 (3) MG/3ML IN SOLN: 3.0000 mL | RESPIRATORY_TRACT | Status: DC | PRN

## 2020-12-15 MED ORDER — KETOROLAC TROMETHAMINE 15 MG/ML IJ SOLN
15.0000 mg | Freq: Four times a day (QID) | INTRAMUSCULAR | Status: DC | PRN
  Administered 2020-12-15 – 2020-12-16 (×3): 15 mg via INTRAVENOUS
  Filled 2020-12-15 (×3): qty 1

## 2020-12-15 MED ORDER — KCL IN DEXTROSE-NACL 20-5-0.45 MEQ/L-%-% IV SOLN
INTRAVENOUS | Status: DC
  Filled 2020-12-15 (×3): qty 1000

## 2020-12-15 MED ORDER — PANTOPRAZOLE SODIUM 40 MG PO TBEC
40.0000 mg | DELAYED_RELEASE_TABLET | Freq: Two times a day (BID) | ORAL | Status: DC
  Administered 2020-12-15 – 2020-12-16 (×3): 40 mg via ORAL
  Filled 2020-12-15 (×3): qty 1

## 2020-12-15 MED ORDER — ACETAMINOPHEN 325 MG PO TABS
650.0000 mg | ORAL_TABLET | Freq: Four times a day (QID) | ORAL | Status: DC | PRN
  Administered 2020-12-15: 650 mg via ORAL
  Filled 2020-12-15 (×2): qty 2

## 2020-12-15 MED ORDER — ENOXAPARIN SODIUM 40 MG/0.4ML IJ SOSY: 40.0000 mg | PREFILLED_SYRINGE | Freq: Every day | INTRAMUSCULAR | Status: DC

## 2020-12-15 MED ORDER — ALBUTEROL SULFATE HFA 108 (90 BASE) MCG/ACT IN AERS: 2.0000 | INHALATION_SPRAY | Freq: Four times a day (QID) | RESPIRATORY_TRACT | Status: DC | PRN

## 2020-12-15 MED ORDER — RISPERIDONE 1 MG PO TABS
0.5000 mg | ORAL_TABLET | Freq: Two times a day (BID) | ORAL | Status: DC
  Administered 2020-12-15 – 2020-12-16 (×2): 0.5 mg via ORAL
  Filled 2020-12-15 (×2): qty 1

## 2020-12-15 MED ORDER — NALOXONE HCL 0.4 MG/ML IJ SOLN
0.4000 mg | Freq: Once | INTRAMUSCULAR | Status: AC
  Administered 2020-12-15: 0.4 mg via INTRAVENOUS
  Filled 2020-12-15: qty 1

## 2020-12-15 MED ORDER — TRAMADOL HCL 50 MG PO TABS
100.0000 mg | ORAL_TABLET | Freq: Once | ORAL | Status: AC
  Administered 2020-12-15: 100 mg via ORAL
  Filled 2020-12-15: qty 2

## 2020-12-15 MED ORDER — LEVETIRACETAM IN NACL 500 MG/100ML IV SOLN
500.0000 mg | Freq: Two times a day (BID) | INTRAVENOUS | Status: DC
  Administered 2020-12-15 – 2020-12-16 (×3): 500 mg via INTRAVENOUS
  Filled 2020-12-15 (×3): qty 100

## 2020-12-15 MED ORDER — ACETAMINOPHEN 650 MG RE SUPP: 650.0000 mg | Freq: Four times a day (QID) | RECTAL | Status: DC | PRN

## 2020-12-15 MED ORDER — FLUTICASONE-UMECLIDIN-VILANT 100-62.5-25 MCG/INH IN AEPB: 1.0000 | INHALATION_SPRAY | Freq: Every day | RESPIRATORY_TRACT | Status: DC

## 2020-12-15 MED ORDER — IPRATROPIUM-ALBUTEROL 0.5-2.5 (3) MG/3ML IN SOLN
3.0000 mL | Freq: Three times a day (TID) | RESPIRATORY_TRACT | Status: DC
  Administered 2020-12-15: 3 mL via RESPIRATORY_TRACT
  Filled 2020-12-15: qty 3

## 2020-12-15 MED ORDER — OXYBUTYNIN CHLORIDE ER 5 MG PO TB24
15.0000 mg | ORAL_TABLET | Freq: Every day | ORAL | Status: DC
  Administered 2020-12-15 – 2020-12-16 (×2): 15 mg via ORAL
  Filled 2020-12-15 (×3): qty 3

## 2020-12-15 MED ORDER — NALOXONE HCL 0.4 MG/ML IJ SOLN: 0.4000 mg | Freq: Once | INTRAMUSCULAR | Status: DC

## 2020-12-15 MED ORDER — FLUTICASONE FUROATE-VILANTEROL 100-25 MCG/INH IN AEPB
1.0000 | INHALATION_SPRAY | Freq: Every day | RESPIRATORY_TRACT | Status: DC
  Filled 2020-12-15 (×2): qty 28

## 2020-12-15 MED ORDER — CHLORHEXIDINE GLUCONATE CLOTH 2 % EX PADS: 6.0000 | MEDICATED_PAD | Freq: Every day | CUTANEOUS | Status: DC

## 2020-12-15 NOTE — Progress Notes (Signed)
Unable to do EEG at [facility] today due to low staffing at [facility]. Dr. Wynetta Emery made aware.

## 2020-12-15 NOTE — ED Notes (Signed)
ED TO INPATIENT HANDOFF REPORT  ED Nurse Name and Phone #:   S Name/Age/Gender Theresa Pace 60 y.o. female Room/Bed: APA18/APA18  Code Status   Code Status: Full Code  Home/SNF/Other Home Patient oriented to: self Is this baseline? No   Triage Complete: Triage complete  Chief Complaint Altered mental state [E32.12] Acute metabolic encephalopathy [Y48.25]  Triage Note Pov from home with cc of seizures. Husband states that she has been having seizures since 0500. Upon rn arrival to lobby patient was making jerking movements with loss of urine. Husband thinks that she has then when she takes muscle relaxer's. Patient placed on 4l [place] due to 87% on RA. Pt able to respond with arousal. Pt still making jerking movement    Hx of seizures but hasnt been on meds or have seizures in a year    Allergies Allergies  Allergen Reactions  . Methadone Hives  . Aspirin Other (See Comments)    Stomach Pain    Level of Care/Admitting Diagnosis ED Disposition    ED Disposition  Admit   Condition  --   [place]: [facility] [[id number]]  Level of Care: Stepdown [14]  Covid Evaluation: Asymptomatic Screening Protocol (No Symptoms)  Diagnosis: Acute metabolic encephalopathy [8889169]  Admitting Physician: [place], [place]  Attending Physician: Murlean Iba [[id number]]  Estimated length of stay: past midnight tomorrow  Certification:: I certify this patient will need inpatient services for at least 2 midnights         B Medical/Surgery History Past Medical History:  Diagnosis Date  . Arthritis   . Asthma   . COPD (chronic obstructive pulmonary disease) ([place])   . Depression   . Fibromyalgia   . Frequent urination at night   . GERD (gastroesophageal reflux disease)   . Hypercholesteremia   . IBS (irritable bowel syndrome)   . Tachycardia    treated with Cardizem  . Tendonitis    Past Surgical History:  Procedure Laterality Date  .  ABDOMINAL HYSTERECTOMY    . APPENDECTOMY    . KNEE ARTHROPLASTY Right 05/23/2017     A IV Location/Drains/Wounds Patient Lines/Drains/Airways Status    Active Line/Drains/Airways    Name Placement date Placement time Site Days   Peripheral IV 12/15/20 20 G Right Antecubital 12/15/20  0557  Antecubital  less than 1          Intake/Output Last 24 hours No intake or output data in the 24 hours ending 12/15/20 1351  Labs/Imaging Results for orders placed or performed during the hospital encounter of 12/15/20 (from the past 48 hour(s))  CBG monitoring, ED     Status: Abnormal   Collection Time: 12/15/20  5:55 AM  Result Value Ref Range   Glucose-Capillary 166 (H) 70 - 99 mg/dL    Comment: Glucose reference range applies only to samples taken after fasting for at least 8 hours.  CK     Status: Abnormal   Collection Time: 12/15/20  5:56 AM  Result Value Ref Range   Total CK 30 (L) 38 - 234 U/L    Comment: Performed at [facility], [address], [place], [place] [address]  Comprehensive metabolic panel     Status: Abnormal   Collection Time: 12/15/20  5:56 AM  Result Value Ref Range   Sodium 138 135 - 145 mmol/L   Potassium 3.6 3.5 - 5.1 mmol/L   Chloride 102 98 - 111 mmol/L   CO2 26 22 - 32  mmol/L   Glucose, Bld 152 (H) 70 - 99 mg/dL    Comment: Glucose reference range applies only to samples taken after fasting for at least 8 hours.   BUN 22 (H) 6 - 20 mg/dL   Creatinine, Ser 0.82 0.44 - 1.00 mg/dL   Calcium 8.4 (L) 8.9 - 10.3 mg/dL   Total Protein 3.9 (L) 6.5 - 8.1 g/dL   Albumin 4.1 3.5 - 5.0 g/dL   AST 21 15 - 41 U/L   ALT 29 0 - 44 U/L   Alkaline Phosphatase 83 38 - 126 U/L   Total Bilirubin 0.7 0.3 - 1.2 mg/dL   GFR, Estimated >60 >60 mL/min    Comment: (NOTE) Calculated using the CKD-EPI Creatinine Equation (2021)    Anion gap 10 5 - 15    Comment: Performed at [facility], [address] [place], [place] [address]  Ethanol     Status: None   Collection  Time: 12/15/20  5:56 AM  Result Value Ref Range   Alcohol, Ethyl (B) <10 <10 mg/dL    Comment: (NOTE) Lowest detectable limit for serum alcohol is 10 mg/dL.  For medical purposes only. Performed at [facility], [address], [place], [place] [address]   CBC with Differential     Status: Abnormal   Collection Time: 12/15/20  5:56 AM  Result Value Ref Range   WBC 29.0 (H) 4.0 - 10.5 K/uL   RBC 5.21 (H) 3.87 - 5.11 MIL/uL   Hemoglobin 16.3 (H) 12.0 - 15.0 g/dL   HCT 49.5 (H) 36.0 - 46.0 %   MCV 95.0 80.0 - 100.0 fL   MCH 31.3 26.0 - 34.0 pg   MCHC 32.9 30.0 - 36.0 g/dL   RDW 13.7 11.5 - 15.5 %   Platelets 318 150 - 400 K/uL   nRBC 0.0 0.0 - 0.2 %   Neutrophils Relative % 79 %   Neutro Abs 23.1 (H) 1.7 - 7.7 K/uL   Lymphocytes Relative 13 %   Lymphs Abs 3.7 0.7 - 4.0 K/uL   Monocytes Relative 6 %   Monocytes Absolute 1.7 (H) 0.1 - 1.0 K/uL   Eosinophils Relative 1 %   Eosinophils Absolute 0.2 0.0 - 0.5 K/uL   Basophils Relative 0 %   Basophils Absolute 0.1 0.0 - 0.1 K/uL   RBC Morphology MORPHOLOGY UNREMARKABLE    Immature Granulocytes 1 %   Abs Immature Granulocytes 0.19 (H) 0.00 - 0.07 K/uL    Comment: Performed at [facility], [address] [place], [place] [address]  Magnesium     Status: None   Collection Time: 12/15/20  5:56 AM  Result Value Ref Range   Magnesium 2.1 1.7 - 2.4 mg/dL    Comment: Performed at [facility], [address], [place], [place] [address]  Save Smear     Status: None   Collection Time: 12/15/20  5:56 AM  Result Value Ref Range   Smear Review SMEAR STAINED AND AVAILABLE FOR REVIEW     Comment: Performed at [facility], [address], [place], [place] [address]  Lactic acid, plasma     Status: None   Collection Time: 12/15/20  6:16 AM  Result Value Ref Range   Lactic Acid, Venous 1.9 0.5 - 1.9 mmol/L    Comment: Performed at [facility], [address] [place], [place] [address]  Blood gas, arterial     Status: Abnormal   Collection  Time: 12/15/20  6:36 AM  Result Value Ref  Range   FIO2 36.00    pH, Arterial 7.384 7.350 - 7.450   pCO2 arterial 43.4 32.0 - 48.0 mmHg   pO2, Arterial 78.9 (L) 83.0 - 108.0 mmHg   Bicarbonate 24.8 20.0 - 28.0 mmol/L   Acid-Base Excess 0.9 0.0 - 2.0 mmol/L   O2 Saturation 95.5 %   Patient temperature 37.0    Allens test (pass/fail) PASS PASS    Comment: Performed at [facility], [address], [place], [place] [address]  Urine rapid drug screen (hosp performed)     Status: Abnormal   Collection Time: 12/15/20  8:16 AM  Result Value Ref Range   Opiates POSITIVE (A) NONE DETECTED   Cocaine NONE DETECTED NONE DETECTED   Benzodiazepines NONE DETECTED NONE DETECTED   Amphetamines NONE DETECTED NONE DETECTED   Tetrahydrocannabinol NONE DETECTED NONE DETECTED   Barbiturates NONE DETECTED NONE DETECTED    Comment: (NOTE) DRUG SCREEN FOR MEDICAL PURPOSES ONLY.  IF CONFIRMATION IS NEEDED FOR ANY PURPOSE, NOTIFY LAB WITHIN 5 DAYS.  LOWEST DETECTABLE LIMITS FOR URINE DRUG SCREEN Drug Class                     Cutoff (ng/mL) Amphetamine and metabolites    1000 Barbiturate and metabolites    200 Benzodiazepine                 119 Tricyclics and metabolites     300 Opiates and metabolites        300 Cocaine and metabolites        300 THC                            50 Performed at [facility], [address], [place],  [address]   Resp Panel by RT-PCR (Flu A&B, Covid) Nasopharyngeal Swab     Status: None   Collection Time: 12/15/20  8:16 AM   Specimen: Nasopharyngeal Swab; Nasopharyngeal(NP) swabs in vial transport medium  Result Value Ref Range   SARS Coronavirus 2 by RT PCR NEGATIVE NEGATIVE    Comment: (NOTE) SARS-CoV-2 target nucleic acids are NOT DETECTED.  The SARS-CoV-2 RNA is generally detectable in upper respiratory specimens during the acute phase of infection. The lowest concentration of SARS-CoV-2 viral copies this assay can detect is 138 copies/mL. A negative  result does not preclude SARS-Cov-2 infection and should not be used as the sole basis for treatment or other patient management decisions. A negative result may occur with  improper specimen collection/handling, submission of specimen other than nasopharyngeal swab, presence of viral mutation(s) within the areas targeted by this assay, and inadequate number of viral copies(<138 copies/mL). A negative result must be combined with clinical observations, patient history, and epidemiological information. The expected result is Negative.  Fact Sheet for Patients:  EntrepreneurPulse.com.au  Fact Sheet for Healthcare Providers:  IncredibleEmployment.be  This test is no t yet approved or cleared by the [place] FDA and  has been authorized for detection and/or diagnosis of SARS-CoV-2 by FDA under an Emergency Use Authorization (EUA). This EUA will remain  in effect (meaning this test can be used) for the duration of the COVID-19 declaration under Section 564(b)(1) of the Act, 21 U.S.C.section 360bbb-3(b)(1), unless the authorization is terminated  or revoked sooner.       Influenza A by PCR NEGATIVE NEGATIVE   Influenza B by PCR NEGATIVE NEGATIVE    Comment: (NOTE) The Xpert Xpress SARS-CoV-2/FLU/RSV plus  assay is intended as an aid in the diagnosis of influenza from Nasopharyngeal swab specimens and should not be used as a sole basis for treatment. Nasal washings and aspirates are unacceptable for Xpert Xpress SARS-CoV-2/FLU/RSV testing.  Fact Sheet for Patients: EntrepreneurPulse.com.au  Fact Sheet for Healthcare Providers: IncredibleEmployment.be  This test is not yet approved or cleared by the [place] FDA and has been authorized for detection and/or diagnosis of SARS-CoV-2 by FDA under an Emergency Use Authorization (EUA). This EUA will remain in effect (meaning this test can be used) for the  duration of the COVID-19 declaration under Section 564(b)(1) of the Act, 21 U.S.C. section 360bbb-3(b)(1), unless the authorization is terminated or revoked.  Performed at [facility], [address] [place], [place] [address]   Urinalysis, Routine w reflex microscopic Urine, Catheterized     Status: Abnormal   Collection Time: 12/15/20  8:17 AM  Result Value Ref Range   Color, Urine YELLOW YELLOW   APPearance CLEAR CLEAR   Specific Gravity, Urine 1.021 1.005 - 1.030   pH 5.0 5.0 - 8.0   Glucose, UA NEGATIVE NEGATIVE mg/dL   Hgb urine dipstick MODERATE (A) NEGATIVE   Bilirubin Urine NEGATIVE NEGATIVE   Ketones, ur NEGATIVE NEGATIVE mg/dL   Protein, ur NEGATIVE NEGATIVE mg/dL   Nitrite NEGATIVE NEGATIVE   Leukocytes,Ua NEGATIVE NEGATIVE   RBC / HPF 0-5 0 - 5 RBC/hpf   WBC, UA 0-5 0 - 5 WBC/hpf   Bacteria, UA NONE SEEN NONE SEEN   Squamous Epithelial / LPF 0-5 0 - 5   Mucus PRESENT     Comment: Performed at [facility], [address], [place],  [address]   DG Chest 1 View  Result Date: 12/15/2020 CLINICAL DATA:  60 year old female with seizures since 0500 hours. Altered mental status. EXAM: CHEST  1 VIEW COMPARISON:  Low-dose screening chest CT 12/14/2020 and earlier. FINDINGS: AP supine view at 0611 hours. Lower lung volumes. Mediastinal contours remain within normal limits. Visualized tracheal air column is within normal limits. Allowing for portable technique the lungs are clear. No pneumothorax or pleural effusion. No acute osseous abnormality identified. IMPRESSION: Lower lung volumes.  No acute cardiopulmonary abnormality. Electronically Signed   By: Genevie Ann M.D.   On: 12/15/2020 06:43   CT Head Wo Contrast  Result Date: 12/15/2020 CLINICAL DATA:  60 year old female with seizures since 0500 hours. Altered mental status. EXAM: CT HEAD WITHOUT CONTRAST TECHNIQUE: Contiguous axial images were obtained from the base of the skull through the vertex without intravenous  contrast. COMPARISON:  Brain MRI and head CT 12/26/2018. FINDINGS: Brain: Stable non contrast CT appearance of the brain. No midline shift, ventriculomegaly, mass effect, evidence of mass lesion, intracranial hemorrhage or evidence of cortically based acute infarction. Gray-white matter differentiation is within normal limits throughout the brain. Vascular: No suspicious intracranial vascular hyperdensity. Skull: Stable hyperostosis. No acute osseous abnormality identified. Sinuses/Orbits: Left sphenoid sinus fluid or mucosal thickening has only mildly increased since 2020. Other Visualized paranasal sinuses and mastoids are stable and well aerated. Other: Negative orbit and scalp soft tissues. IMPRESSION: 1. Stable and negative noncontrast CT appearance of the brain. 2. Mild chronic left sphenoid sinus disease. Electronically Signed   By: Genevie Ann M.D.   On: 12/15/2020 06:41   MR BRAIN WO CONTRAST  Result Date: 12/15/2020 CLINICAL DATA:  Mental status change, unknown cause. Altered mental status. EXAM: MRI HEAD WITHOUT CONTRAST TECHNIQUE: Multiplanar, multiecho pulse sequences of the brain and surrounding structures were  obtained without intravenous contrast. COMPARISON:  Head CT same day.  MRI 12/26/2018. FINDINGS: Brain: Diffusion imaging does not show any acute or subacute infarction or other cause of restricted diffusion. The brainstem and cerebellum are normal. Cerebral hemispheres are normal. No evidence of old or recent small or large vessel infarction, mass lesion, hemorrhage, hydrocephalus or extra-axial collection. Mesial temporal lobes appear normal. Vascular: Major vessels at the base of the brain show flow. Skull and upper cervical spine: Negative Sinuses/Orbits: Clear/normal Other: None IMPRESSION: Normal MRI of the brain. Electronically Signed   By: Nelson Chimes M.D.   On: 12/15/2020 09:02   DG Foot Complete Right  Result Date: 12/15/2020 CLINICAL DATA:  Redness and swelling.  Missing  toenails. EXAM: RIGHT FOOT COMPLETE - 3+ VIEW COMPARISON:  Right foot series 07/19/2018. FINDINGS: Diffuse degenerative change. Old nonunited cuboid fracture cannot be excluded. Old nonunited medial malleolar fracture. No acute bony or joint abnormality identified. No focal bony erosion noted. No radiopaque foreign body. IMPRESSION: 1. Diffuse degenerative change. No acute bony abnormality. No radiopaque foreign body. 2. Old nonunited cuboid fracture cannot be excluded. Old nonunited medial malleolar fracture. Electronically Signed   By: Marcello Moores  Register   On: 12/15/2020 08:37    Pending Labs Unresulted Labs (From admission, onward)    Start     Ordered   12/16/20 0500  Comprehensive metabolic panel  Daily,   R      12/15/20 1025   12/16/20 0500  Magnesium  Daily,   R      12/15/20 1025   12/16/20 0500  CBC WITH DIFFERENTIAL  Daily,   R      12/15/20 1025   12/15/20 1051  Pathologist smear review  Add-on,   AD        12/15/20 1050   12/15/20 1020  HIV Antibody (routine testing w rflx)  (HIV Antibody (Routine testing w reflex) panel)  Once,   STAT        12/15/20 1025          Vitals/Pain Today's Vitals   12/15/20 0915 12/15/20 0930 12/15/20 1143 12/15/20 1200  BP: 122/66 115/61 137/77 134/74  Pulse: 69 65 93 86  Resp: 14 14 17 17   Temp:   98.7 F (37.1 C) 98.6 F (37 C)  TempSrc:   Oral Oral  SpO2: 97% 97% 93% 94%  Weight:      Height:        Isolation Precautions No active isolations  Medications Medications  dextrose 5 % and 0.45 % NaCl with KCl 20 mEq/L infusion ( Intravenous New Bag/Given 12/15/20 1212)  acetaminophen (TYLENOL) tablet 650 mg (has no administration in time range)    Or  acetaminophen (TYLENOL) suppository 650 mg (has no administration in time range)  ketorolac (TORADOL) 15 MG/ML injection 15 mg (has no administration in time range)  prochlorperazine (COMPAZINE) injection 10 mg (has no administration in time range)  levETIRAcetam (KEPPRA) IVPB 500  mg/100 mL premix (500 mg Intravenous New Bag/Given 12/15/20 1141)  naloxone (NARCAN) injection 0.4 mg (0.4 mg Intravenous Not Given 12/15/20 1142)  naloxone (NARCAN) injection 0.4 mg (0.4 mg Intravenous Given 12/15/20 0856)    Mobility non-ambulatory High fall risk   Focused Assessments    R Recommendations: See Admitting Provider Note  Report given to:   Additional Notes:

## 2020-12-15 NOTE — ED Notes (Signed)
ED TO INPATIENT HANDOFF REPORT  ED Nurse Name and Phone #:  [phone]  S Name/Age/Gender Theresa Pace 60 y.o. female Room/Bed: APA18/APA18  Code Status   Code Status: Prior  Home/SNF/Other Home  Is this baseline? No   Triage Complete: Triage complete  Chief Complaint Altered mental state [R41.82]  Triage Note Pov from home with cc of seizures. Husband states that she has been having seizures since 0500. Upon rn arrival to lobby patient was making jerking movements with loss of urine. Husband thinks that she has then when she takes muscle relaxer's. Patient placed on 4l [place] due to 87% on RA. Pt able to respond with arousal. Pt still making jerking movement    Hx of seizures but hasnt been on meds or have seizures in a year    Allergies Allergies  Allergen Reactions  . Methadone Hives  . Aspirin Other (See Comments)    Stomach Pain    Level of Care/Admitting Diagnosis ED Disposition    ED Disposition  Admit   Condition  --   [place]: [facility] [[id number]]  Level of Care: Telemetry [5]  Covid Evaluation: Asymptomatic Screening Protocol (No Symptoms)  Diagnosis: Altered mental state [950932]  Admitting Physician: [place], [place]  Attending Physician: Murlean Iba [[id number]]  Estimated length of stay: past midnight tomorrow  Certification:: I certify this patient will need inpatient services for at least 2 midnights         B Medical/Surgery History Past Medical History:  Diagnosis Date  . Arthritis   . Asthma   . COPD (chronic obstructive pulmonary disease) ([place])   . Depression   . Fibromyalgia   . Frequent urination at night   . GERD (gastroesophageal reflux disease)   . Hypercholesteremia   . IBS (irritable bowel syndrome)   . Tachycardia    treated with Cardizem  . Tendonitis    Past Surgical History:  Procedure Laterality Date  . ABDOMINAL HYSTERECTOMY    . APPENDECTOMY    . KNEE ARTHROPLASTY Right  05/23/2017     A IV Location/Drains/Wounds Patient Lines/Drains/Airways Status    Active Line/Drains/Airways    Name Placement date Placement time Site Days   Peripheral IV 12/15/20 20 G Right Antecubital 12/15/20  0557  Antecubital  less than 1          Intake/Output Last 24 hours No intake or output data in the 24 hours ending 12/15/20 0901  Labs/Imaging Results for orders placed or performed during the hospital encounter of 12/15/20 (from the past 48 hour(s))  CBG monitoring, ED     Status: Abnormal   Collection Time: 12/15/20  5:55 AM  Result Value Ref Range   Glucose-Capillary 166 (H) 70 - 99 mg/dL    Comment: Glucose reference range applies only to samples taken after fasting for at least 8 hours.  CK     Status: Abnormal   Collection Time: 12/15/20  5:56 AM  Result Value Ref Range   Total CK 30 (L) 38 - 234 U/L    Comment: Performed at [facility], [address] [place], [place] [address]  Comprehensive metabolic panel     Status: Abnormal   Collection Time: 12/15/20  5:56 AM  Result Value Ref Range   Sodium 138 135 - 145 mmol/L   Potassium 3.6 3.5 - 5.1 mmol/L   Chloride 102 98 - 111 mmol/L   CO2 26 22 - 32 mmol/L   Glucose, Bld 152 (H)  70 - 99 mg/dL    Comment: Glucose reference range applies only to samples taken after fasting for at least 8 hours.   BUN 22 (H) 6 - 20 mg/dL   Creatinine, Ser 0.82 0.44 - 1.00 mg/dL   Calcium 8.4 (L) 8.9 - 10.3 mg/dL   Total Protein 3.9 (L) 6.5 - 8.1 g/dL   Albumin 4.1 3.5 - 5.0 g/dL   AST 21 15 - 41 U/L   ALT 29 0 - 44 U/L   Alkaline Phosphatase 83 38 - 126 U/L   Total Bilirubin 0.7 0.3 - 1.2 mg/dL   GFR, Estimated >60 >60 mL/min    Comment: (NOTE) Calculated using the CKD-EPI Creatinine Equation (2021)    Anion gap 10 5 - 15    Comment: Performed at [facility], [address] [place], [place] [address]  Ethanol     Status: None   Collection Time: 12/15/20  5:56 AM  Result Value Ref Range   Alcohol, Ethyl (B) <10  <10 mg/dL    Comment: (NOTE) Lowest detectable limit for serum alcohol is 10 mg/dL.  For medical purposes only. Performed at [facility], [address], [place], [place] [address]   CBC with Differential     Status: Abnormal   Collection Time: 12/15/20  5:56 AM  Result Value Ref Range   WBC 29.0 (H) 4.0 - 10.5 K/uL   RBC 5.21 (H) 3.87 - 5.11 MIL/uL   Hemoglobin 16.3 (H) 12.0 - 15.0 g/dL   HCT 49.5 (H) 36.0 - 46.0 %   MCV 95.0 80.0 - 100.0 fL   MCH 31.3 26.0 - 34.0 pg   MCHC 32.9 30.0 - 36.0 g/dL   RDW 13.7 11.5 - 15.5 %   Platelets 318 150 - 400 K/uL   nRBC 0.0 0.0 - 0.2 %   Neutrophils Relative % 79 %   Neutro Abs 23.1 (H) 1.7 - 7.7 K/uL   Lymphocytes Relative 13 %   Lymphs Abs 3.7 0.7 - 4.0 K/uL   Monocytes Relative 6 %   Monocytes Absolute 1.7 (H) 0.1 - 1.0 K/uL   Eosinophils Relative 1 %   Eosinophils Absolute 0.2 0.0 - 0.5 K/uL   Basophils Relative 0 %   Basophils Absolute 0.1 0.0 - 0.1 K/uL   RBC Morphology MORPHOLOGY UNREMARKABLE    Immature Granulocytes 1 %   Abs Immature Granulocytes 0.19 (H) 0.00 - 0.07 K/uL    Comment: Performed at [facility], [address], [place], [place] [address]  Magnesium     Status: None   Collection Time: 12/15/20  5:56 AM  Result Value Ref Range   Magnesium 2.1 1.7 - 2.4 mg/dL    Comment: Performed at [facility], [address], [place], [place] [address]  Lactic acid, plasma     Status: None   Collection Time: 12/15/20  6:16 AM  Result Value Ref Range   Lactic Acid, Venous 1.9 0.5 - 1.9 mmol/L    Comment: Performed at [facility], [address] [place], [place] [address]  Blood gas, arterial     Status: Abnormal   Collection Time: 12/15/20  6:36 AM  Result Value Ref Range   FIO2 36.00    pH, Arterial 7.384 7.350 - 7.450   pCO2 arterial 43.4 32.0 - 48.0 mmHg   pO2, Arterial 78.9 (L) 83.0 - 108.0 mmHg   Bicarbonate 24.8 20.0 - 28.0 mmol/L   Acid-Base Excess 0.9 0.0 - 2.0 mmol/L   O2 Saturation 95.5 %  Patient  temperature 37.0    Allens test (pass/fail) PASS PASS    Comment: Performed at [facility], [address] [place], [place] [address]  Urine rapid drug screen (hosp performed)     Status: Abnormal   Collection Time: 12/15/20  8:16 AM  Result Value Ref Range   Opiates POSITIVE (A) NONE DETECTED   Cocaine NONE DETECTED NONE DETECTED   Benzodiazepines NONE DETECTED NONE DETECTED   Amphetamines NONE DETECTED NONE DETECTED   Tetrahydrocannabinol NONE DETECTED NONE DETECTED   Barbiturates NONE DETECTED NONE DETECTED    Comment: (NOTE) DRUG SCREEN FOR MEDICAL PURPOSES ONLY.  IF CONFIRMATION IS NEEDED FOR ANY PURPOSE, NOTIFY LAB WITHIN 5 DAYS.  LOWEST DETECTABLE LIMITS FOR URINE DRUG SCREEN Drug Class                     Cutoff (ng/mL) Amphetamine and metabolites    1000 Barbiturate and metabolites    200 Benzodiazepine                 761 Tricyclics and metabolites     300 Opiates and metabolites        300 Cocaine and metabolites        300 THC                            50 Performed at [facility], [address] [place], [place] [address]   Urinalysis, Routine w reflex microscopic Urine, Catheterized     Status: Abnormal   Collection Time: 12/15/20  8:17 AM  Result Value Ref Range   Color, Urine YELLOW YELLOW   APPearance CLEAR CLEAR   Specific Gravity, Urine 1.021 1.005 - 1.030   pH 5.0 5.0 - 8.0   Glucose, UA NEGATIVE NEGATIVE mg/dL   Hgb urine dipstick MODERATE (A) NEGATIVE   Bilirubin Urine NEGATIVE NEGATIVE   Ketones, ur NEGATIVE NEGATIVE mg/dL   Protein, ur NEGATIVE NEGATIVE mg/dL   Nitrite NEGATIVE NEGATIVE   Leukocytes,Ua NEGATIVE NEGATIVE   RBC / HPF 0-5 0 - 5 RBC/hpf   WBC, UA 0-5 0 - 5 WBC/hpf   Bacteria, UA NONE SEEN NONE SEEN   Squamous Epithelial / LPF 0-5 0 - 5   Mucus PRESENT     Comment: Performed at [facility], [address], [place], [place] [address]   DG Chest 1 View  Result Date: 12/15/2020 CLINICAL DATA:  60 year old female with seizures  since 0500 hours. Altered mental status. EXAM: CHEST  1 VIEW COMPARISON:  Low-dose screening chest CT 12/14/2020 and earlier. FINDINGS: AP supine view at 0611 hours. Lower lung volumes. Mediastinal contours remain within normal limits. Visualized tracheal air column is within normal limits. Allowing for portable technique the lungs are clear. No pneumothorax or pleural effusion. No acute osseous abnormality identified. IMPRESSION: Lower lung volumes.  No acute cardiopulmonary abnormality. Electronically Signed   By: Genevie Ann M.D.   On: 12/15/2020 06:43   CT Head Wo Contrast  Result Date: 12/15/2020 CLINICAL DATA:  60 year old female with seizures since 0500 hours. Altered mental status. EXAM: CT HEAD WITHOUT CONTRAST TECHNIQUE: Contiguous axial images were obtained from the base of the skull through the vertex without intravenous contrast. COMPARISON:  Brain MRI and head CT 12/26/2018. FINDINGS: Brain: Stable non contrast CT appearance of the brain. No midline shift, ventriculomegaly, mass effect, evidence of mass lesion, intracranial hemorrhage or evidence of cortically based acute infarction. Gray-white matter differentiation is  within normal limits throughout the brain. Vascular: No suspicious intracranial vascular hyperdensity. Skull: Stable hyperostosis. No acute osseous abnormality identified. Sinuses/Orbits: Left sphenoid sinus fluid or mucosal thickening has only mildly increased since 2020. Other Visualized paranasal sinuses and mastoids are stable and well aerated. Other: Negative orbit and scalp soft tissues. IMPRESSION: 1. Stable and negative noncontrast CT appearance of the brain. 2. Mild chronic left sphenoid sinus disease. Electronically Signed   By: Genevie Ann M.D.   On: 12/15/2020 06:41   DG Foot Complete Right  Result Date: 12/15/2020 CLINICAL DATA:  Redness and swelling.  Missing toenails. EXAM: RIGHT FOOT COMPLETE - 3+ VIEW COMPARISON:  Right foot series 07/19/2018. FINDINGS: Diffuse  degenerative change. Old nonunited cuboid fracture cannot be excluded. Old nonunited medial malleolar fracture. No acute bony or joint abnormality identified. No focal bony erosion noted. No radiopaque foreign body. IMPRESSION: 1. Diffuse degenerative change. No acute bony abnormality. No radiopaque foreign body. 2. Old nonunited cuboid fracture cannot be excluded. Old nonunited medial malleolar fracture. Electronically Signed   By: Marcello Moores  Register   On: 12/15/2020 08:37    Pending Labs Unresulted Labs (From admission, onward)    Start     Ordered   12/15/20 0725  Resp Panel by RT-PCR (Flu A&B, Covid) Nasopharyngeal Swab  (Tier 2 - Symptomatic/asymptomatic with Precautions )  Once,   STAT       Question Answer Comment  Is this test for diagnosis or screening Screening   Symptomatic for COVID-19 as defined by CDC No   Hospitalized for COVID-19 No   Admitted to ICU for COVID-19 No   Previously tested for COVID-19 Yes   Resident in a congregate (group) care setting No   Employed in healthcare setting No   Pregnant No   Has patient completed COVID vaccination(s) (2 doses of Pfizer/Moderna 1 dose of [employer]) No      12/15/20 0724          Vitals/Pain Today's Vitals   12/15/20 0600 12/15/20 0601 12/15/20 0647 12/15/20 0730  BP: (!) 151/95  133/75 115/64  Pulse: (!) 105   70  Resp: 17  20 13   Temp:      TempSrc:      SpO2: 92%  93% 93%  Weight:  194 lb 10.7 oz (88.3 kg)    Height:  5\' 2"  (1.575 m)      Isolation Precautions Airborne and Contact precautions  Medications Medications  naloxone (NARCAN) injection 0.4 mg (0.4 mg Intravenous Given 12/15/20 0856)    Mobility  High fall risk   Focused Assessments    R Recommendations: See Admitting Provider Note  Report given to:   Additional Notes:

## 2020-12-15 NOTE — Progress Notes (Signed)
Patient requesting pain medication for her chronic pain. Explained that because she was in with altered mental status, that her pain medication had been discontinued by her attending.  Patient stated that she was "better now so she could have it".  I explained that it I could not give the medication without an order.  Dr. Darrell Jewel notified of patient requesting pain medication.

## 2020-12-15 NOTE — ED Provider Notes (Signed)
[facility] EMERGENCY DEPARTMENT Provider Note   CSN: [id number] Arrival date & time: 12/15/20  0542     History Chief Complaint  Patient presents with   Seizures    Theresa Pace is a 60 y.o. female.  The history is provided by the patient and the spouse. The history is limited by the condition of the patient (Altered mental status).  Seizures She has history of COPD, hyperlipidemia, seizures and is brought in by her husband after possibly having seizures at home.  He went to sleep at about 1230, and was awakened by the patient having some jerking.  She has had similar episodes in the past which have been treated as seizures, but she is not currently on any anticonvulsants.  There was no bit lip or tongue and there was no fecal or urinary incontinence.   Past Medical History:  Diagnosis Date   Arthritis    Asthma    COPD (chronic obstructive pulmonary disease) (HCC)    Depression    Fibromyalgia    Frequent urination at night    GERD (gastroesophageal reflux disease)    Hypercholesteremia    IBS (irritable bowel syndrome)    Tachycardia    treated with Cardizem   Tendonitis     Patient Active Problem List   Diagnosis Date Noted   Leukocytosis 01/29/2020   Hyperplastic rectal polyp 10/15/2019   Closed fracture of right ankle 12/25/2018 03/21/2019   Primary osteoarthritis of left knee 10/93/2355   Acute metabolic encephalopathy 73/22/0254   Altered mental status    Seizure-like activity (HCC)    Hypokalemia    Elevated glucose level    Prolonged Q-T interval on ECG    IBS (irritable bowel syndrome) 06/06/2018   Hx of adenomatous colonic polyps 06/06/2018   History of total left knee replacement 06/28/2017   Primary localized osteoarthrosis, lower leg 02/20/2017   Acute respiratory distress 07/07/2016   Asthma exacerbation 07/07/2016   Sinus tachycardia 10/22/2015   Acute exacerbation of chronic obstructive pulmonary disease (COPD) ([place]) 10/21/2015   Anxiety  10/21/2015   GERD (gastroesophageal reflux disease) 10/21/2015   Chronic pain 10/21/2015   Hyperlipidemia 10/21/2015   Acute respiratory failure with hypoxia ([place]) 02/18/2015   COPD (chronic obstructive pulmonary disease) ([place]) 02/16/2015   COPD exacerbation ([place]) 02/16/2015   Mycoplasma pneumonia 02/16/2015   Right knee pain 03/13/2013   High risk medication use 02/14/2013   Spondylolisthesis of lumbosacral region 02/14/2013   Chronic pain syndrome 02/14/2013   Seizure ([place]) 02/22/2012   Bleeding hemorrhoid 02/22/2012   Grade I internal hemorrhoids 02/22/2012   Acute encephalopathy 02/21/2012   Asthma 02/21/2012   Depression 02/21/2012    Past Surgical History:  Procedure Laterality Date   ABDOMINAL HYSTERECTOMY     APPENDECTOMY     KNEE ARTHROPLASTY Right 05/23/2017     OB History   No obstetric history on file.     Family History  Problem Relation Age of Onset   Diabetes Brother    Colon cancer Neg Hx     Social History   Tobacco Use   Smoking status: Former    Packs/day: 0.25    Types: Cigarettes    Quit date: 05/07/2019    Years since quitting: 1.6   Smokeless tobacco: Never  Vaping Use   Vaping Use: Never used  Substance Use Topics   Alcohol use: No   Drug use: No    Home Medications Prior to Admission medications   Medication Sig Start Date  End Date Taking? Authorizing Provider  albuterol (PROVENTIL HFA;VENTOLIN HFA) 108 (90 BASE) MCG/ACT inhaler Inhale 2 puffs into the lungs every 6 (six) hours as needed for wheezing or shortness of breath.     [provider]  amphetamine-dextroamphetamine (ADDERALL) 30 MG tablet Take 30 mg by mouth 3 (three) times daily.    [provider]  aspirin EC 81 MG tablet Take 81 mg by mouth daily.    [provider]  baclofen (LIORESAL) 10 MG tablet Take 10 mg by mouth 2 (two) times daily.    [provider]  budesonide (PULMICORT) 0.5 MG/2ML nebulizer solution Take 2 mLs (0.5 mg  total) by nebulization daily. Patient taking differently: Take 0.5 mg by nebulization 4 (four) times daily as needed (for shortness of breath or wheezing).  06/27/16   Isla Pence, MD  dicyclomine (BENTYL) 10 MG capsule Take 1 capsule (10 mg total) by mouth 4 (four) times daily as needed for spasms (or diarrhea). 10/25/18   Carlis Stable, NP  diltiazem (DILACOR XR) 120 MG 24 hr capsule Take 120 mg by mouth daily.    [provider]  HYDROcodone-acetaminophen (NORCO/VICODIN) 5-325 MG tablet Take 1 tablet by mouth 2 (two) times daily.    [provider]  ipratropium-albuterol (DUONEB) 0.5-2.5 (3) MG/3ML SOLN Take 3 mLs by nebulization 4 (four) times daily. Patient taking differently: Take 3 mLs by nebulization 3 (three) times daily.  02/20/15   Samuella Cota, MD  metoprolol tartrate (LOPRESSOR) 100 MG tablet Take Two Hours Prior to CT Scan. 06/07/19   Fay Records, MD  pantoprazole (PROTONIX) 40 MG tablet Take 1 tablet (40 mg total) by mouth 2 (two) times daily before a meal. 07/11/16   Orvan Falconer, MD  predniSONE (DELTASONE) 20 MG tablet Take 2 tablets (40 mg total) by mouth daily with breakfast. 12/07/20   Scot Jun, FNP  risperiDONE (RISPERDAL) 0.5 MG tablet Take 0.5 mg by mouth 2 (two) times daily.    [provider]  rosuvastatin (CRESTOR) 20 MG tablet Take 20 mg by mouth daily.    [provider]  solifenacin (VESICARE) 5 MG tablet Take 5 mg by mouth daily.    [provider]  traZODone (DESYREL) 50 MG tablet Take 150 mg by mouth at bedtime.     [provider]    Allergies    Methadone and Aspirin  Review of Systems   Review of Systems  Unable to perform ROS: Mental status change  Neurological:  Positive for seizures.   Physical Exam Updated Vital Signs BP 136/82   Pulse (!) 106   Temp 99.6 F (37.6 C) (Rectal)   Resp 18   Ht 5\' 2"  (1.575 m)   Wt 88.3 kg   SpO2 92%   BMI 35.60 kg/m   Physical Exam Vitals and  nursing note reviewed.  60 year old female, resting comfortably and in no acute distress. Vital signs are significant for slightly elevated heart rate. Oxygen saturation is 84%, which is hypoxic, increased to 92% when placed on 4 L nasal oxygen. Head is normocephalic and atraumatic. PERRLA, EOMI. Oropharynx is clear. Neck is nontender and supple without adenopathy or JVD. Back is nontender and there is no CVA tenderness. Lungs are clear without rales, wheezes, or rhonchi. Chest is nontender. Heart has regular rate and rhythm without murmur. Abdomen is soft, flat, nontender without masses or hepatosplenomegaly and peristalsis is normoactive. Extremities have no cyanosis or edema, full range of motion  is present. Skin is warm and dry without rash. Neurologic: She is not awake, but easily arouses.  She is oriented to person, place, time and will follow commands.  Cranial nerves are grossly intact and she moves all extremities equally.  Somewhat random myoclonic jerks are noted without any kind of rhythmic pattern to suggest actual seizure.  ED Results / Procedures / Treatments   Labs (all labs ordered are listed, but only abnormal results are displayed) Labs Reviewed  CK - Abnormal; Notable for the following components:      Result Value   Total CK 30 (*)    All other components within normal limits  COMPREHENSIVE METABOLIC PANEL - Abnormal; Notable for the following components:   Glucose, Bld 152 (*)    BUN 22 (*)    Calcium 8.4 (*)    Total Protein 3.9 (*)    All other components within normal limits  CBC WITH DIFFERENTIAL/PLATELET - Abnormal; Notable for the following components:   WBC 29.0 (*)    RBC 5.21 (*)    Hemoglobin 16.3 (*)    HCT 49.5 (*)    Neutro Abs 23.1 (*)    Monocytes Absolute 1.7 (*)    Abs Immature Granulocytes 0.19 (*)    All other components within normal limits  BLOOD GAS, ARTERIAL - Abnormal; Notable for the following components:   pO2, Arterial 78.9 (*)     All other components within normal limits  CBG MONITORING, ED - Abnormal; Notable for the following components:   Glucose-Capillary 166 (*)    All other components within normal limits  RESP PANEL BY RT-PCR (FLU A&B, COVID) ARPGX2  LACTIC ACID, PLASMA  ETHANOL  MAGNESIUM  RAPID URINE DRUG SCREEN, HOSP PERFORMED  URINALYSIS, ROUTINE W REFLEX MICROSCOPIC    EKG EKG Interpretation  Date/Time:  Tuesday December 15 2020 05:54:45 EDT Ventricular Rate:  109 PR Interval:  157 QRS Duration: 127 QT Interval:  367 QTC Calculation: 495 R Axis:   87 Text Interpretation: Sinus tachycardia Left bundle branch block When compared with ECG of 12/25/2018, Left bundle branch block is now present Confirmed by Delora Fuel ([id number]) on 12/15/2020 6:13:30 AM  Radiology DG Chest 1 View  Result Date: 12/15/2020 CLINICAL DATA:  60 year old female with seizures since 0500 hours. Altered mental status. EXAM: CHEST  1 VIEW COMPARISON:  Low-dose screening chest CT 12/14/2020 and earlier. FINDINGS: AP supine view at 0611 hours. Lower lung volumes. Mediastinal contours remain within normal limits. Visualized tracheal air column is within normal limits. Allowing for portable technique the lungs are clear. No pneumothorax or pleural effusion. No acute osseous abnormality identified. IMPRESSION: Lower lung volumes.  No acute cardiopulmonary abnormality. Electronically Signed   By: Genevie Ann M.D.   On: 12/15/2020 06:43   CT Head Wo Contrast  Result Date: 12/15/2020 CLINICAL DATA:  60 year old female with seizures since 0500 hours. Altered mental status. EXAM: CT HEAD WITHOUT CONTRAST TECHNIQUE: Contiguous axial images were obtained from the base of the skull through the vertex without intravenous contrast. COMPARISON:  Brain MRI and head CT 12/26/2018. FINDINGS: Brain: Stable non contrast CT appearance of the brain. No midline shift, ventriculomegaly, mass effect, evidence of mass lesion, intracranial hemorrhage or evidence of  cortically based acute infarction. Gray-white matter differentiation is within normal limits throughout the brain. Vascular: No suspicious intracranial vascular hyperdensity. Skull: Stable hyperostosis. No acute osseous abnormality identified. Sinuses/Orbits: Left sphenoid sinus fluid or mucosal thickening has only mildly increased since 2020. Other Visualized paranasal sinuses  and mastoids are stable and well aerated. Other: Negative orbit and scalp soft tissues. IMPRESSION: 1. Stable and negative noncontrast CT appearance of the brain. 2. Mild chronic left sphenoid sinus disease. Electronically Signed   By: Genevie Ann M.D.   On: 12/15/2020 06:41    Procedures Procedures   Medications Ordered in ED Medications - No data to display  ED Course  I have reviewed the triage vital signs and the nursing notes.  Pertinent labs & imaging results that were available during my care of the patient were reviewed by me and considered in my medical decision making (see chart for details).   MDM Rules/Calculators/A&P                         Altered mental status with myoclonic jerks.  Old records are reviewed and she was admitted 2 years ago with what was felt to be a seizure and discharged on levetiracetam, but that has been discontinued.  There has also been some concern in the past that her seizures may be medication related.  She is somewhat hypoxic on arrival and did require supplemental oxygen.  Blood gases in the past have not shown CO2 retention.  ABG shows no CO2 retention.  Chest x-ray and CT of head are unremarkable.  Labs are significant for marked leukocytosis without a left shift.  Lactic acid level and uric acid levels are normal arguing against significant seizure activity.  Patient was reevaluated and she is no longer having myoclonic jerks, but she is now somnolent and difficult to arouse.  We will plan to admit for evaluation of altered mental status.  Case is discussed with Dr. Wynetta Emery of [facility], who agrees to admit the patient.  Final Clinical Impression(s) / ED Diagnoses Final diagnoses:  Altered mental status, unspecified altered mental status type  Myoclonic jerking  Leukocytosis, unspecified type    Rx / DC Orders ED Discharge Orders     None        Delora Fuel, MD 92/01/00 [phone]

## 2020-12-15 NOTE — ED Notes (Signed)
Pt responsive with arousal

## 2020-12-15 NOTE — ED Triage Notes (Signed)
Pov from home with cc of seizures. Husband states that she has been having seizures since 0500. Upon rn arrival to lobby patient was making jerking movements with loss of urine. Husband thinks that she has then when she takes muscle relaxer's. Patient placed on 4l [place] due to 87% on RA. Pt able to respond with arousal. Pt still making jerking movement    Hx of seizures but hasnt been on meds or have seizures in a year

## 2020-12-15 NOTE — Consult Note (Signed)
Reeds A. Merlene Laughter, MD     [email or web address]          Theresa Pace is an 60 y.o. female.   ASSESSMENT/PLAN: Seizure occurring off medication: The patient should be on long-term antiepileptic medication. She previously was on 500 mg twice a day and this is recommended.    The patient presented with a seizure that was witnessed by her husband. The description is that of a generalized tonoclonic activity associated with loss of consciousness and amnesia. No oral trauma is reported. There are no reports of bladder or bowel incontinence. The patient previously has had previous events last being about 2020. It appears that her spells were well controlled when she was started on Keppra about that time in 2020. Under direction of her primary care provider, the medication was discontinued a few months ago. The patient does not report having any side effects her untoward issues with the medication. It appears the medication was discontinued because her seizures were well controlled. She reports mostly being her baseline at this time although she reports muscle ache and feeling fatigued all over. There are no reports of diplopia, headaches or fevers. The review systems is otherwise unrevealing.    GENERAL:  This is a pleasant overweight female who is doing well at this time.  HEENT:  Neck is supple no trauma noted.  ABDOMEN: soft  EXTREMITIES: No edema; status post bilateral total knee replacement   BACK: normal  SKIN: Normal by inspection.    MENTAL STATUS: Alert and oriented. Speech, language and cognition are generally intact. Judgment and insight normal.   CRANIAL NERVES: Pupils are equal, round and reactive to light and accomodation; extra ocular movements are full, there is no significant nystagmus; visual fields are full; upper and lower facial muscles are normal in strength and symmetric, there is no flattening of the nasolabial folds; tongue is midline; uvula is  midline; shoulder elevation is normal.  MOTOR: Normal tone, bulk and strength; no pronator drift.  COORDINATION: Left finger to nose is normal, right finger to nose is normal, No rest tremor; no intention tremor; no postural tremor; no bradykinesia.  REFLEXES: Deep tendon reflexes are symmetrical and normal.   SENSATION: Normal to light touch and temperature.             NEURO NOTE 2020 1.  Recurrent seizures that appears to be mostly unprovoked.  The patient therefore has a increased risk of developing epilepsy long-term.  I agree with the use of Keppra 500 twice a day.  Seizure precaution is warranted and discussed with the patient.  She is on few psychotropic medications but I do not believe that these are the culprit of her seizures.     The patient is a 60 year old white female who presents with generalized tonic-clonic seizures.  This is her third event in the last year.  She had one earlier this year in February and one few months before then.  1 of her prior seizures was associated with the patient being on combination of Seroquel and Cymbalta.  It was believed that the combination may have caused her seizures.  She is on Adderall and apparently has been on this medication for a while.  She has had no new medications recently other than for possibly the initiation of an NSAID.  The patient's current event was not associated with oral trauma, bladder urinary incontinence.  There is no history of head injuries or family history of seizures.  No reports of  CNS infections or stroke.  The patient was ambulating when she had the spell which was witnessed by her husband.  The.  She fell and injured her lower extremities and actually fractured the right ankle.  She also reports having some bruise on the left.  The review of systems otherwise negative.            Blood pressure (!) 141/73, pulse 76, temperature 99.2 F (37.3 C), temperature source Oral, resp. rate 15, height 5\' 2"   (1.575 m), weight 78.5 kg, SpO2 97 %.  Past Medical History:  Diagnosis Date   Arthritis    Asthma    COPD (chronic obstructive pulmonary disease) (HCC)    Depression    Fibromyalgia    Frequent urination at night    GERD (gastroesophageal reflux disease)    Hypercholesteremia    IBS (irritable bowel syndrome)    Tachycardia    treated with Cardizem   Tendonitis     Past Surgical History:  Procedure Laterality Date   ABDOMINAL HYSTERECTOMY     APPENDECTOMY     KNEE ARTHROPLASTY Right 05/23/2017    Family History  Problem Relation Age of Onset   Diabetes Brother    Colon cancer Neg Hx     Social History:  reports that she quit smoking about 19 months ago. Her smoking use included cigarettes. She smoked an average of .25 packs per day. She has never used smokeless tobacco. She reports that she does not drink alcohol and does not use drugs.  Allergies:  Allergies  Allergen Reactions   Methadone Hives   Aspirin Other (See Comments)    Stomach Pain    Medications: Prior to Admission medications   Medication Sig Start Date End Date Taking? Authorizing Provider  albuterol (PROVENTIL HFA;VENTOLIN HFA) 108 (90 BASE) MCG/ACT inhaler Inhale 2 puffs into the lungs every 6 (six) hours as needed for wheezing or shortness of breath.    Yes [provider]  amphetamine-dextroamphetamine (ADDERALL) 30 MG tablet Take 30 mg by mouth 3 (three) times daily.   Yes [provider]  aspirin EC 81 MG tablet Take 81 mg by mouth daily.   Yes [provider]  baclofen (LIORESAL) 10 MG tablet Take 5 mg by mouth 2 (two) times daily.   Yes [provider]  budesonide (PULMICORT) 0.5 MG/2ML nebulizer solution Take 2 mLs (0.5 mg total) by nebulization daily. Patient taking differently: Take 0.5 mg by nebulization 4 (four) times daily as needed (for shortness of breath or wheezing). 06/27/16  Yes Isla Pence, MD  diltiazem (DILACOR XR) 120 MG 24 hr capsule Take  120 mg by mouth daily.   Yes [provider]  halobetasol (ULTRAVATE) 0.05 % ointment Apply 1 application topically daily as needed (rash). 11/26/20  Yes [provider]  HYDROcodone-acetaminophen (NORCO/VICODIN) 5-325 MG tablet Take 1 tablet by mouth 2 (two) times daily.   Yes [provider]  ipratropium-albuterol (DUONEB) 0.5-2.5 (3) MG/3ML SOLN Take 3 mLs by nebulization 4 (four) times daily. Patient taking differently: Take 3 mLs by nebulization 3 (three) times daily. 02/20/15  Yes Samuella Cota, MD  oxybutynin (DITROPAN XL) 15 MG 24 hr tablet Take 15 mg by mouth daily. 11/03/20  Yes [provider]  pantoprazole (PROTONIX) 40 MG tablet Take 1 tablet (40 mg total) by mouth 2 (two) times daily before a meal. 07/11/16  Yes Orvan Falconer, MD  risperiDONE (RISPERDAL) 0.5 MG tablet Take 0.5 mg by mouth 2 (two) times daily.  Yes [provider]  rosuvastatin (CRESTOR) 20 MG tablet Take 20 mg by mouth daily.   Yes [provider]  traZODone (DESYREL) 50 MG tablet Take 150 mg by mouth at bedtime.    Yes [provider]  TRELEGY ELLIPTA 100-62.5-25 MCG/INH AEPB Inhale 1 puff into the lungs daily. 11/26/20  Yes [provider]  dicyclomine (BENTYL) 10 MG capsule Take 1 capsule (10 mg total) by mouth 4 (four) times daily as needed for spasms (or diarrhea). Patient not taking: No sig reported 10/25/18 12/15/20  Carlis Stable, NP  metoprolol tartrate (LOPRESSOR) 100 MG tablet Take Two Hours Prior to CT Scan. Patient not taking: No sig reported 06/07/19 12/15/20  Fay Records, MD    Scheduled Meds:  aspirin EC  81 mg Oral Daily   [START ON 12/16/2020] diltiazem  120 mg Oral Daily   fluticasone furoate-vilanterol  1 puff Inhalation Daily   And   umeclidinium bromide  1 puff Inhalation Daily   ipratropium-albuterol  3 mL Nebulization TID   naLOXone (NARCAN)  injection  0.4 mg Intravenous Once   oxybutynin  15 mg Oral Daily   pantoprazole  40  mg Oral BID AC   risperiDONE  0.5 mg Oral BID   rosuvastatin  20 mg Oral QPM   Continuous Infusions:  dextrose 5 % and 0.45 % NaCl with KCl 20 mEq/L Stopped (12/15/20 1214)   levETIRAcetam Stopped (12/15/20 1155)   PRN Meds:.acetaminophen **OR** acetaminophen, albuterol, ketorolac, prochlorperazine     Results for orders placed or performed during the hospital encounter of 12/15/20 (from the past 48 hour(s))  CBG monitoring, ED     Status: Abnormal   Collection Time: 12/15/20  5:55 AM  Result Value Ref Range   Glucose-Capillary 166 (H) 70 - 99 mg/dL    Comment: Glucose reference range applies only to samples taken after fasting for at least 8 hours.  CK     Status: Abnormal   Collection Time: 12/15/20  5:56 AM  Result Value Ref Range   Total CK 30 (L) 38 - 234 U/L    Comment: Performed at [facility], [address], [place], [place] [address]  Comprehensive metabolic panel     Status: Abnormal   Collection Time: 12/15/20  5:56 AM  Result Value Ref Range   Sodium 138 135 - 145 mmol/L   Potassium 3.6 3.5 - 5.1 mmol/L   Chloride 102 98 - 111 mmol/L   CO2 26 22 - 32 mmol/L   Glucose, Bld 152 (H) 70 - 99 mg/dL    Comment: Glucose reference range applies only to samples taken after fasting for at least 8 hours.   BUN 22 (H) 6 - 20 mg/dL   Creatinine, Ser 0.82 0.44 - 1.00 mg/dL   Calcium 8.4 (L) 8.9 - 10.3 mg/dL   Total Protein 3.9 (L) 6.5 - 8.1 g/dL   Albumin 4.1 3.5 - 5.0 g/dL   AST 21 15 - 41 U/L   ALT 29 0 - 44 U/L   Alkaline Phosphatase 83 38 - 126 U/L   Total Bilirubin 0.7 0.3 - 1.2 mg/dL   GFR, Estimated >60 >60 mL/min    Comment: (NOTE) Calculated using the CKD-EPI Creatinine Equation (2021)    Anion gap 10 5 - 15    Comment: Performed at [facility], [address] [place], [place] [address]  Ethanol     Status: None   Collection Time: 12/15/20  5:56 AM  Result Value  Ref Range   Alcohol, Ethyl (B) <10 <10 mg/dL    Comment: (NOTE) Lowest detectable limit for  serum alcohol is 10 mg/dL.  For medical purposes only. Performed at [facility], [address], [place], [place] [address]   CBC with Differential     Status: Abnormal   Collection Time: 12/15/20  5:56 AM  Result Value Ref Range   WBC 29.0 (H) 4.0 - 10.5 K/uL   RBC 5.21 (H) 3.87 - 5.11 MIL/uL   Hemoglobin 16.3 (H) 12.0 - 15.0 g/dL   HCT 49.5 (H) 36.0 - 46.0 %   MCV 95.0 80.0 - 100.0 fL   MCH 31.3 26.0 - 34.0 pg   MCHC 32.9 30.0 - 36.0 g/dL   RDW 13.7 11.5 - 15.5 %   Platelets 318 150 - 400 K/uL   nRBC 0.0 0.0 - 0.2 %   Neutrophils Relative % 79 %   Neutro Abs 23.1 (H) 1.7 - 7.7 K/uL   Lymphocytes Relative 13 %   Lymphs Abs 3.7 0.7 - 4.0 K/uL   Monocytes Relative 6 %   Monocytes Absolute 1.7 (H) 0.1 - 1.0 K/uL   Eosinophils Relative 1 %   Eosinophils Absolute 0.2 0.0 - 0.5 K/uL   Basophils Relative 0 %   Basophils Absolute 0.1 0.0 - 0.1 K/uL   RBC Morphology MORPHOLOGY UNREMARKABLE    Immature Granulocytes 1 %   Abs Immature Granulocytes 0.19 (H) 0.00 - 0.07 K/uL    Comment: Performed at [facility], [address], [place], [place] [address]  Magnesium     Status: None   Collection Time: 12/15/20  5:56 AM  Result Value Ref Range   Magnesium 2.1 1.7 - 2.4 mg/dL    Comment: Performed at [facility], [address] [place], [place] [address]  Save Smear     Status: None   Collection Time: 12/15/20  5:56 AM  Result Value Ref Range   Smear Review SMEAR STAINED AND AVAILABLE FOR REVIEW     Comment: Performed at [facility], [address], [place], [place] [address]  Lactic acid, plasma     Status: None   Collection Time: 12/15/20  6:16 AM  Result Value Ref Range   Lactic Acid, Venous 1.9 0.5 - 1.9 mmol/L    Comment: Performed at [facility], [address] [place], [place] [address]  Blood gas, arterial     Status: Abnormal   Collection Time: 12/15/20  6:36 AM  Result Value Ref Range   FIO2 36.00    pH, Arterial 7.384 7.350 - 7.450   pCO2 arterial 43.4 32.0 - 48.0  mmHg   pO2, Arterial 78.9 (L) 83.0 - 108.0 mmHg   Bicarbonate 24.8 20.0 - 28.0 mmol/L   Acid-Base Excess 0.9 0.0 - 2.0 mmol/L   O2 Saturation 95.5 %   Patient temperature 37.0    Allens test (pass/fail) PASS PASS    Comment: Performed at [facility], [address], [place],  [address]  Urine rapid drug screen (hosp performed)     Status: Abnormal   Collection Time: 12/15/20  8:16 AM  Result Value Ref Range   Opiates POSITIVE (A) NONE DETECTED   Cocaine NONE DETECTED NONE DETECTED   Benzodiazepines NONE DETECTED NONE DETECTED   Amphetamines NONE DETECTED NONE DETECTED   Tetrahydrocannabinol NONE DETECTED NONE DETECTED   Barbiturates NONE DETECTED NONE DETECTED    Comment: (NOTE) DRUG SCREEN FOR MEDICAL PURPOSES ONLY.  IF CONFIRMATION IS NEEDED FOR ANY PURPOSE,  NOTIFY LAB WITHIN 5 DAYS.  LOWEST DETECTABLE LIMITS FOR URINE DRUG SCREEN Drug Class                     Cutoff (ng/mL) Amphetamine and metabolites    1000 Barbiturate and metabolites    200 Benzodiazepine                 542 Tricyclics and metabolites     300 Opiates and metabolites        300 Cocaine and metabolites        300 THC                            50 Performed at [facility], [address] [place], [place] [address]   Resp Panel by RT-PCR (Flu A&B, Covid) Nasopharyngeal Swab     Status: None   Collection Time: 12/15/20  8:16 AM   Specimen: Nasopharyngeal Swab; Nasopharyngeal(NP) swabs in vial transport medium  Result Value Ref Range   SARS Coronavirus 2 by RT PCR NEGATIVE NEGATIVE    Comment: (NOTE) SARS-CoV-2 target nucleic acids are NOT DETECTED.  The SARS-CoV-2 RNA is generally detectable in upper respiratory specimens during the acute phase of infection. The lowest concentration of SARS-CoV-2 viral copies this assay can detect is 138 copies/mL. A negative result does not preclude SARS-Cov-2 infection and should not be used as the sole basis for treatment or other patient management  decisions. A negative result may occur with  improper specimen collection/handling, submission of specimen other than nasopharyngeal swab, presence of viral mutation(s) within the areas targeted by this assay, and inadequate number of viral copies(<138 copies/mL). A negative result must be combined with clinical observations, patient history, and epidemiological information. The expected result is Negative.  Fact Sheet for Patients:  EntrepreneurPulse.com.au  Fact Sheet for Healthcare Providers:  IncredibleEmployment.be  This test is no t yet approved or cleared by the [place] FDA and  has been authorized for detection and/or diagnosis of SARS-CoV-2 by FDA under an Emergency Use Authorization (EUA). This EUA will remain  in effect (meaning this test can be used) for the duration of the COVID-19 declaration under Section 564(b)(1) of the Act, 21 U.S.C.section 360bbb-3(b)(1), unless the authorization is terminated  or revoked sooner.       Influenza A by PCR NEGATIVE NEGATIVE   Influenza B by PCR NEGATIVE NEGATIVE    Comment: (NOTE) The Xpert Xpress SARS-CoV-2/FLU/RSV plus assay is intended as an aid in the diagnosis of influenza from Nasopharyngeal swab specimens and should not be used as a sole basis for treatment. Nasal washings and aspirates are unacceptable for Xpert Xpress SARS-CoV-2/FLU/RSV testing.  Fact Sheet for Patients: EntrepreneurPulse.com.au  Fact Sheet for Healthcare Providers: IncredibleEmployment.be  This test is not yet approved or cleared by the [place] FDA and has been authorized for detection and/or diagnosis of SARS-CoV-2 by FDA under an Emergency Use Authorization (EUA). This EUA will remain in effect (meaning this test can be used) for the duration of the COVID-19 declaration under Section 564(b)(1) of the Act, 21 U.S.C. section 360bbb-3(b)(1), unless the authorization  is terminated or revoked.  Performed at [facility], [address] [place], [place] [address]   Urinalysis, Routine w reflex microscopic Urine, Catheterized     Status: Abnormal   Collection Time: 12/15/20  8:17 AM  Result Value Ref Range   Color, Urine YELLOW YELLOW   APPearance CLEAR CLEAR  Specific Gravity, Urine 1.021 1.005 - 1.030   pH 5.0 5.0 - 8.0   Glucose, UA NEGATIVE NEGATIVE mg/dL   Hgb urine dipstick MODERATE (A) NEGATIVE   Bilirubin Urine NEGATIVE NEGATIVE   Ketones, ur NEGATIVE NEGATIVE mg/dL   Protein, ur NEGATIVE NEGATIVE mg/dL   Nitrite NEGATIVE NEGATIVE   Leukocytes,Ua NEGATIVE NEGATIVE   RBC / HPF 0-5 0 - 5 RBC/hpf   WBC, UA 0-5 0 - 5 WBC/hpf   Bacteria, UA NONE SEEN NONE SEEN   Squamous Epithelial / LPF 0-5 0 - 5   Mucus PRESENT     Comment: Performed at [facility], [address], [place], [place] [address]  MRSA Next Gen by PCR, Nasal     Status: None   Collection Time: 12/15/20  3:16 PM   Specimen: Nasal Mucosa; Nasal Swab  Result Value Ref Range   MRSA by PCR Next Gen NOT DETECTED NOT DETECTED    Comment: (NOTE) The GeneXpert MRSA Assay (FDA approved for NASAL specimens only), is one component of a comprehensive MRSA colonization surveillance program. It is not intended to diagnose MRSA infection nor to guide or monitor treatment for MRSA infections. Test performance is not FDA approved in patients less than 15 years old. Performed at [facility], [address] [place], [place] [address]     Studies/Results:  BRAIN MRI FINDINGS: Brain: Diffusion imaging does not show any acute or subacute infarction or other cause of restricted diffusion. The brainstem and cerebellum are normal. Cerebral hemispheres are normal. No evidence of old or recent small or large vessel infarction, mass lesion, hemorrhage, hydrocephalus or extra-axial collection. Mesial temporal lobes appear normal.   Vascular: Major vessels at the base of the brain show flow.    Skull and upper cervical spine: Negative   Sinuses/Orbits: Clear/normal   Other: None   IMPRESSION: Normal MRI of the brain.      Brain MRI scan is reviewed in person and shows no acute lesions on DWI. No hemorrhages noted. There is no white lesions. At no evidence mesial temporal sclerosis.   Nieves Barberi A. Merlene Laughter, M.D.  Diplomate, Tax adviser of Psychiatry and Neurology ( Neurology). 12/15/2020, 5:04 PM

## 2020-12-15 NOTE — H&P (Addendum)
History and Physical  [place] BDZ:[id number] DOB: 1961/02/14 DOA: 12/15/2020  PCP: [facility]  Patient coming from: Home   Level of care: Telemetry  I have personally briefly reviewed patient's old medical records in [place]  Chief Complaint: Seizure at home per husband  Historian: ED provider and husband (patient not able to participate)  HPI: Theresa Pace is a 60 y.o. female with medical history significant for prior hospitalization in 2020 for what was thought at the time unprovoked tonic-clonic seizures but possibly related to psychotropic medications.  She was treated with keppra 500 mg twice daily at the time.  Unfortunately she has been off of her seizure medication for at least the last year.  No known seizures in the last year.  Husband reports that she started having seizures at 5am. He says that he believes that it is associated with her taking "muscle relaxers."    This patient has a well documented history of chronic pain, fibromyalgia, opioid dependence, COPD, HTN, IBS, depression with psychosis, tachycardia (on cardizem), hyperlipidemia and arthritis.    ED Course: Patient arrived in an obtunded state.  She was noted to have myoclonic jerks.  No seizure activity observed in ED.  She was unresponsive and somnolent.  CT head did not show any acute findings.  SARS 2 coronavirus test negative.  ABG unremarkable.  Lactic acid 1.9.  Urinalysis negative.  UDS positive for opioids.  Chest x-ray no acute findings.  Serum alcohol less than 10, glucose 152, BBC 29.0, hemoglobin 16.3, hematocrit 49.5.  Sodium 138, potassium 3.6, BUN 22, creatinine 0.82, magnesium 2.1, anion gap 10, total protein 3.9.  Total CK 30.  Admission was requested for further evaluation of altered mental status.  Review of Systems: Review of Systems  Unable to perform ROS: Mental status change    Past Medical History:  Diagnosis Date   Arthritis    Asthma     COPD (chronic obstructive pulmonary disease) (HCC)    Depression    Fibromyalgia    Frequent urination at night    GERD (gastroesophageal reflux disease)    Hypercholesteremia    IBS (irritable bowel syndrome)    Tachycardia    treated with Cardizem   Tendonitis     Past Surgical History:  Procedure Laterality Date   ABDOMINAL HYSTERECTOMY     APPENDECTOMY     KNEE ARTHROPLASTY Right 05/23/2017     reports that she quit smoking about 19 months ago. Her smoking use included cigarettes. She smoked an average of .25 packs per day. She has never used smokeless tobacco. She reports that she does not drink alcohol and does not use drugs.  Allergies  Allergen Reactions   Methadone Hives   Aspirin Other (See Comments)    Stomach Pain    Family History  Problem Relation Age of Onset   Diabetes Brother    Colon cancer Neg Hx     Prior to Admission medications   Medication Sig Start Date End Date Taking? Authorizing Provider  albuterol (PROVENTIL HFA;VENTOLIN HFA) 108 (90 BASE) MCG/ACT inhaler Inhale 2 puffs into the lungs every 6 (six) hours as needed for wheezing or shortness of breath.     [provider]  amphetamine-dextroamphetamine (ADDERALL) 30 MG tablet Take 30 mg by mouth 3 (three) times daily.    [provider]  aspirin EC 81 MG tablet Take 81 mg by mouth daily.    [provider]  baclofen (LIORESAL) 10 MG tablet Take 10 mg by mouth 2 (two) times daily.    [provider]  budesonide (PULMICORT) 0.5 MG/2ML nebulizer solution Take 2 mLs (0.5 mg total) by nebulization daily. Patient taking differently: Take 0.5 mg by nebulization 4 (four) times daily as needed (for shortness of breath or wheezing).  06/27/16   Isla Pence, MD  dicyclomine (BENTYL) 10 MG capsule Take 1 capsule (10 mg total) by mouth 4 (four) times daily as needed for spasms (or diarrhea). 10/25/18   Carlis Stable, NP  diltiazem (DILACOR XR) 120 MG 24 hr capsule Take 120 mg  by mouth daily.    [provider]  HYDROcodone-acetaminophen (NORCO/VICODIN) 5-325 MG tablet Take 1 tablet by mouth 2 (two) times daily.    [provider]  ipratropium-albuterol (DUONEB) 0.5-2.5 (3) MG/3ML SOLN Take 3 mLs by nebulization 4 (four) times daily. Patient taking differently: Take 3 mLs by nebulization 3 (three) times daily.  02/20/15   Samuella Cota, MD  metoprolol tartrate (LOPRESSOR) 100 MG tablet Take Two Hours Prior to CT Scan. 06/07/19   Fay Records, MD  pantoprazole (PROTONIX) 40 MG tablet Take 1 tablet (40 mg total) by mouth 2 (two) times daily before a meal. 07/11/16   Orvan Falconer, MD  predniSONE (DELTASONE) 20 MG tablet Take 2 tablets (40 mg total) by mouth daily with breakfast. 12/07/20   Scot Jun, FNP  risperiDONE (RISPERDAL) 0.5 MG tablet Take 0.5 mg by mouth 2 (two) times daily.    [provider]  rosuvastatin (CRESTOR) 20 MG tablet Take 20 mg by mouth daily.    [provider]  solifenacin (VESICARE) 5 MG tablet Take 5 mg by mouth daily.    [provider]  traZODone (DESYREL) 50 MG tablet Take 150 mg by mouth at bedtime.     [provider]    Physical Exam: Vitals:   12/15/20 0901 12/15/20 0903 12/15/20 0915 12/15/20 0930  BP: 123/75  122/66 115/61  Pulse: 70  69 65  Resp:  14 14 14   Temp:      TempSrc:      SpO2:   97% 97%  Weight:      Height:        Constitutional: Pt is somnolent, but protecting airway, arouses to noxious stimuli.  Snoring.  Eyes: PERRL, lids and conjunctivae normal ENMT: Mucous membranes are door. Posterior pharynx clear of any exudate or lesions.   Poor dentition.  Neck: normal, supple, no masses, no thyromegaly Respiratory: clear to auscultation bilaterally, no wheezing, no crackles. Normal respiratory effort. No accessory muscle use.  Cardiovascular: normal s1, s2 sounds, no murmurs / rubs / gallops. No extremity edema. 2+ pedal pulses. No carotid bruits.  Abdomen:  no tenderness, no masses palpated. No hepatosplenomegaly. Bowel sounds positive.  Musculoskeletal: no clubbing / cyanosis. No joint deformity upper and lower extremities. Right ankle joint is deformed. no contractures. Normal muscle tone.  Skin: no rashes, lesions, ulcers. No induration Neurologic: no gross deficits could be determined.  Psychiatric: Pt is somnolent and unable to assess.   Labs on Admission: I have personally reviewed following labs and imaging studies  CBC: Recent Labs  Lab 12/15/20 0556  WBC 29.0*  NEUTROABS 23.1*  HGB 16.3*  HCT 49.5*  MCV 95.0  PLT 937   Basic Metabolic Panel: Recent Labs  Lab 12/15/20 0556  NA 138  K 3.6  CL 102  CO2 26  GLUCOSE 152*  BUN 22*  CREATININE 0.82  CALCIUM 8.4*  MG 2.1   GFR: Estimated Creatinine Clearance: 77.2 mL/min (by C-G formula based on SCr of 0.82 mg/dL). Liver Function Tests: Recent Labs  Lab 12/15/20 0556  AST 21  ALT 29  ALKPHOS 83  BILITOT 0.7  PROT 3.9*  ALBUMIN 4.1   No results for input(s): LIPASE, AMYLASE in the last 168 hours. No results for input(s): AMMONIA in the last 168 hours. Coagulation Profile: No results for input(s): INR, PROTIME in the last 168 hours. Cardiac Enzymes: Recent Labs  Lab 12/15/20 0556  CKTOTAL 30*   BNP (last 3 results) No results for input(s): PROBNP in the last 8760 hours. HbA1C: No results for input(s): HGBA1C in the last 72 hours. CBG: Recent Labs  Lab 12/15/20 0555  GLUCAP 166*   Lipid Profile: No results for input(s): CHOL, HDL, LDLCALC, TRIG, CHOLHDL, LDLDIRECT in the last 72 hours. Thyroid Function Tests: No results for input(s): TSH, T4TOTAL, FREET4, T3FREE, THYROIDAB in the last 72 hours. Anemia Panel: No results for input(s): VITAMINB12, FOLATE, FERRITIN, TIBC, IRON, RETICCTPCT in the last 72 hours. Urine analysis:    Component Value Date/Time   COLORURINE YELLOW 12/15/2020 [place] 12/15/2020 0817   LABSPEC 1.021  12/15/2020 0817   PHURINE 5.0 12/15/2020 0817   GLUCOSEU NEGATIVE 12/15/2020 0817   HGBUR MODERATE (A) 12/15/2020 0817   BILIRUBINUR NEGATIVE 12/15/2020 0817   KETONESUR NEGATIVE 12/15/2020 0817   PROTEINUR NEGATIVE 12/15/2020 0817   UROBILINOGEN 0.2 02/21/2012 0055   NITRITE NEGATIVE 12/15/2020 0817   LEUKOCYTESUR NEGATIVE 12/15/2020 0817    Radiological Exams on Admission: DG Chest 1 View  Result Date: 12/15/2020 CLINICAL DATA:  60 year old female with seizures since 0500 hours. Altered mental status. EXAM: CHEST  1 VIEW COMPARISON:  Low-dose screening chest CT 12/14/2020 and earlier. FINDINGS: AP supine view at 0611 hours. Lower lung volumes. Mediastinal contours remain within normal limits. Visualized tracheal air column is within normal limits. Allowing for portable technique the lungs are clear. No pneumothorax or pleural effusion. No acute osseous abnormality identified. IMPRESSION: Lower lung volumes.  No acute cardiopulmonary abnormality. Electronically Signed   By: Genevie Ann M.D.   On: 12/15/2020 06:43   CT Head Wo Contrast  Result Date: 12/15/2020 CLINICAL DATA:  60 year old female with seizures since 0500 hours. Altered mental status. EXAM: CT HEAD WITHOUT CONTRAST TECHNIQUE: Contiguous axial images were obtained from the base of the skull through the vertex without intravenous contrast. COMPARISON:  Brain MRI and head CT 12/26/2018. FINDINGS: Brain: Stable non contrast CT appearance of the brain. No midline shift, ventriculomegaly, mass effect, evidence of mass lesion, intracranial hemorrhage or evidence of cortically based acute infarction. Gray-white matter differentiation is within normal limits throughout the brain. Vascular: No suspicious intracranial vascular hyperdensity. Skull: Stable hyperostosis. No acute osseous abnormality identified. Sinuses/Orbits: Left sphenoid sinus fluid or mucosal thickening has only mildly increased since 2020. Other Visualized paranasal sinuses and  mastoids are stable and well aerated. Other: Negative orbit and scalp soft tissues. IMPRESSION: 1. Stable and negative noncontrast CT appearance of the brain. 2. Mild chronic left sphenoid sinus disease. Electronically Signed   By: Genevie Ann M.D.   On: 12/15/2020 06:41   MR BRAIN WO CONTRAST  Result Date: 12/15/2020 CLINICAL DATA:  Mental status change, unknown cause. Altered mental status. EXAM: MRI HEAD WITHOUT CONTRAST TECHNIQUE: Multiplanar, multiecho pulse sequences of the brain and surrounding structures were obtained without intravenous contrast. COMPARISON:  Head CT same day.  MRI  12/26/2018. FINDINGS: Brain: Diffusion imaging does not show any acute or subacute infarction or other cause of restricted diffusion. The brainstem and cerebellum are normal. Cerebral hemispheres are normal. No evidence of old or recent small or large vessel infarction, mass lesion, hemorrhage, hydrocephalus or extra-axial collection. Mesial temporal lobes appear normal. Vascular: Major vessels at the base of the brain show flow. Skull and upper cervical spine: Negative Sinuses/Orbits: Clear/normal Other: None IMPRESSION: Normal MRI of the brain. Electronically Signed   By: Nelson Chimes M.D.   On: 12/15/2020 09:02   DG Foot Complete Right  Result Date: 12/15/2020 CLINICAL DATA:  Redness and swelling.  Missing toenails. EXAM: RIGHT FOOT COMPLETE - 3+ VIEW COMPARISON:  Right foot series 07/19/2018. FINDINGS: Diffuse degenerative change. Old nonunited cuboid fracture cannot be excluded. Old nonunited medial malleolar fracture. No acute bony or joint abnormality identified. No focal bony erosion noted. No radiopaque foreign body. IMPRESSION: 1. Diffuse degenerative change. No acute bony abnormality. No radiopaque foreign body. 2. Old nonunited cuboid fracture cannot be excluded. Old nonunited medial malleolar fracture. Electronically Signed   By: Marcello Moores  Register   On: 12/15/2020 08:37    EKG: Independently reviewed. No acute  ST-T wave abnormalities.   Assessment/Plan Principal Problem:   Altered mental state Active Problems:   Acute encephalopathy   Asthma   Depression   COPD (chronic obstructive pulmonary disease) (HCC)   GERD (gastroesophageal reflux disease)   Chronic pain   Hyperlipidemia   Leukocytosis   Myoclonic jerking   Polycythemia   Acute metabolic encephalopathy  - cause unknown but given history reported by husband patient could be post-ictal if she did have seizures early this morning.  Also concerned about polypharmacy given history of taking multiple sedating medications.  Home med rec has not been reconciled.  I have asked for it to be completed by pharmacy as soon as possible.  UDS positive for opioids.  I have given 1 dose of naloxone but no improvement in mentation.  Continue supportive measures as ordered.  MRI brain ordered.  Neuro checks ordered.  IV fluid.    Epilepsy - Pt has not been taking her antiepileptics at least in the last year.  I am resuming keppra 500 mg IV BID for now until we can get an inpatient neurology consultation.  Adult EEG has been ordered.  Seizure precautions ordered.     Leukocytosis - unknown cause, had previously been on steroids but until we have her home medications reconciled I cannot assume that she is currently taking steroids.  Obtain blood cultures x 2.  Send blood for peripheral smear.  No signs of meningitis with supple neck and rectal temp of 99.  Follow CBC with differential.  I have recently learned that patient had some toenails removed from right foot on 7/20 by her podiatrist. Monitor for signs of infection.  This could possibly be some reactive leukocytosis from procedure?? Will follow closely with daily cbc/diff testing and await for pathologist smear review. ** Update: husband confirms patient recently completed a course of prednisone prescribed by her podiatrist about 2 days ago.**   Chronic pain - she has opioid in her UDS.  I will try another  dose of naloxone.  Hold all opioid at this time.  Reconcile home medication.   Myoclonic jerking - ED noted she has been having these movements.  ED did not think she had a seizure but I worry that as her husband stated he witnessed seizures at home that prompted him  to bring her into the hospital.  Follow up EEG.  Continue with plan to restart keppra.   Polycythemia - unknown cause, send blood for peripheral smear.  Check ferritin. Recheck in AM after IV fluid hydration.    GERD - IV protonix ordered while patient is NPO.    History of dysphagia - SLP evaluation requested.     DVT prophylaxis: SCDs  Code Status: Full   Family Communication: husband updated telephone 7/26 Disposition Plan: TBD  Consults called: neurology   Admission status: INP  Level of care: Telemetry Irwin Brakeman MD [facility] How to contact the [facility] Attending or Consulting provider [place] or covering provider during after hours [place], for this patient?  Check the care team in [facility] and look for a) attending/consulting [facility] provider listed and b) the [facility] team listed Log into [email or web address] and use [place]'s universal password to access. If you do not have the password, please contact the hospital operator. Locate the [facility] provider you are looking for under [facility] and page to a number that you can be directly reached. If you still have difficulty reaching the provider, please page the [facility] (Director on Call) for the Hospitalists listed on amion for assistance.   If 7PM-7AM, please contact night-coverage [email or web address] Password [facility]  12/15/2020, 10:26 AM

## 2020-12-15 NOTE — Progress Notes (Signed)
Patient frustrated because her pain medication and sleep medications have been discontinued.  Explained to patient multiple times that because she was altered when she arrived that any sedating medications had been discontinued. Also explained to patient that we were checking her neurological status and because of this, by sedating her with medications, we wouldn't be able to notice any subtle changes.  Patient on the phone at this time, complaining that these medications have been discontinued.

## 2020-12-15 NOTE — ED Notes (Signed)
No response to Narcan noted

## 2020-12-15 NOTE — Evaluation (Signed)
Clinical/Bedside Swallow Evaluation Patient Details  Name: Theresa Pace MRN: [id number] Date of Birth: 04-Jun-1960  Today's Date: 12/15/2020 Time: SLP Start Time (ACUTE ONLY): 1500 SLP Stop Time (ACUTE ONLY): 1520 SLP Time Calculation (min) (ACUTE ONLY): 20 min  Past Medical History:  Past Medical History:  Diagnosis Date   Arthritis    Asthma    COPD (chronic obstructive pulmonary disease) (HCC)    Depression    Fibromyalgia    Frequent urination at night    GERD (gastroesophageal reflux disease)    Hypercholesteremia    IBS (irritable bowel syndrome)    Tachycardia    treated with Cardizem   Tendonitis    Past Surgical History:  Past Surgical History:  Procedure Laterality Date   ABDOMINAL HYSTERECTOMY     APPENDECTOMY     KNEE ARTHROPLASTY Right 05/23/2017   HPI:  Theresa Pace is a 60 y.o. female with medical history significant for prior hospitalization in 2020 for what was thought at the time unprovoked tonic-clonic seizures but possibly related to psychotropic medications.  She was treated with keppra 500 mg twice daily at the time.  Unfortunately she has been off of her seizure medication for at least the last year.  No known seizures in the last year.  Husband reports that she started having seizures at 5am. He says that he believes that it is associated with her taking "muscle relaxers."  This patient has a well documented history of chronic pain, fibromyalgia, opioid dependence, COPD, HTN, IBS, depression with psychosis, tachycardia (on cardizem), hyperlipidemia and arthritis. BSE requested   Assessment / Plan / Recommendation Clinical Impression  Clinical swallowing evaluation completed while Pt was sitting upright in bed; Pt required mod verbal cues to rouse but became and remained appropriate for PO trials requiring occasional verbal cues to remain alert. Pt has dentures but they are not here at the hospital, she reports that she often eats without them. Pt consumed  thin liquids, regular textures and puree textures without overt s/sx of aspiration. Recommend initiate regular diet with thin liquids; meds are ok whole with liquids. Please ensure Pt is alert and responsive when PO is provided. There are no further needs at this time, ST will sign off SLP Visit Diagnosis: Dysphagia, unspecified (R13.10)    Aspiration Risk       Diet Recommendation Regular;Thin liquid   Liquid Administration via: Cup;Straw Medication Administration: Whole meds with liquid Supervision: Patient able to self feed;Staff to assist with self feeding;Intermittent supervision to cue for compensatory strategies Compensations: Minimize environmental distractions;Slow rate;Small sips/bites Postural Changes: Seated upright at 90 degrees    Other  Recommendations Oral Care Recommendations: Oral care BID   Follow up Recommendations None        Swallow Study   General HPI: Theresa Pace is a 60 y.o. female with medical history significant for prior hospitalization in 2020 for what was thought at the time unprovoked tonic-clonic seizures but possibly related to psychotropic medications.  She was treated with keppra 500 mg twice daily at the time.  Unfortunately she has been off of her seizure medication for at least the last year.  No known seizures in the last year.  Husband reports that she started having seizures at 5am. He says that he believes that it is associated with her taking "muscle relaxers."  This patient has a well documented history of chronic pain, fibromyalgia, opioid dependence, COPD, HTN, IBS, depression with psychosis, tachycardia (on cardizem), hyperlipidemia and arthritis. BSE requested  Type of Study: Bedside Swallow Evaluation Previous Swallow Assessment: BSE 2020 Diet Prior to this Study: NPO Temperature Spikes Noted: No Respiratory Status: Room air History of Recent Intubation: No Behavior/Cognition: Alert;Cooperative;Lethargic/Drowsy Oral Cavity Assessment:  Within Functional Limits Oral Cavity - Dentition: Edentulous;Dentures, not available Vision: Functional for self-feeding Self-Feeding Abilities: Able to feed self;Needs assist;Needs set up Patient Positioning: Upright in bed Baseline Vocal Quality: Normal Volitional Cough: Strong Volitional Swallow: Able to elicit    Oral/Motor/Sensory Function Overall Oral Motor/Sensory Function: Within functional limits   Ice Chips Ice chips: Within functional limits   Thin Liquid Thin Liquid: Within functional limits    Nectar Thick Nectar Thick Liquid: Not tested   Honey Thick Honey Thick Liquid: Not tested   Puree Puree: Within functional limits   Solid     Solid: Within functional limits     Carmela Piechowski H. Roddie Mc, CCC-SLP Speech Language Pathologist  Wende Bushy 12/15/2020,3:20 PM

## 2020-12-15 NOTE — ED Notes (Signed)
Call placed to RT for ABG draw.

## 2020-12-16 ENCOUNTER — Inpatient Hospital Stay (HOSPITAL_COMMUNITY)
Admit: 2020-12-16 | Discharge: 2020-12-16 | Disposition: A | Discharge status: Home / Self Care | Payer: Medicaid Other | Attending: Family Medicine | Admitting: Family Medicine

## 2020-12-16 DIAGNOSIS — R4182 Altered mental status, unspecified: Secondary | ICD-10-CM

## 2020-12-16 DIAGNOSIS — J449 Chronic obstructive pulmonary disease, unspecified: Secondary | ICD-10-CM | POA: Diagnosis not present

## 2020-12-16 LAB — CBC WITH DIFFERENTIAL/PLATELET
Abs Immature Granulocytes: 0.05 10*3/uL (ref 0.00–0.07)
Basophils Absolute: 0 10*3/uL (ref 0.0–0.1)
Basophils Relative: 0 %
Eosinophils Absolute: 0.2 10*3/uL (ref 0.0–0.5)
Eosinophils Relative: 1 %
HCT: 42.6 % (ref 36.0–46.0)
Hemoglobin: 13.9 g/dL (ref 12.0–15.0)
Immature Granulocytes: 0 %
Lymphocytes Relative: 22 %
Lymphs Abs: 2.6 10*3/uL (ref 0.7–4.0)
MCH: 31.4 pg (ref 26.0–34.0)
MCHC: 32.6 g/dL (ref 30.0–36.0)
MCV: 96.4 fL (ref 80.0–100.0)
Monocytes Absolute: 0.7 10*3/uL (ref 0.1–1.0)
Monocytes Relative: 6 %
Neutro Abs: 8.6 10*3/uL — ABNORMAL HIGH (ref 1.7–7.7)
Neutrophils Relative %: 71 %
Platelets: 194 10*3/uL (ref 150–400)
RBC: 4.42 MIL/uL (ref 3.87–5.11)
RDW: 13.9 % (ref 11.5–15.5)
WBC: 12.3 10*3/uL — ABNORMAL HIGH (ref 4.0–10.5)
nRBC: 0 % (ref 0.0–0.2)

## 2020-12-16 LAB — COMPREHENSIVE METABOLIC PANEL
ALT: 26 U/L (ref 0–44)
AST: 18 U/L (ref 15–41)
Albumin: 3.2 g/dL — ABNORMAL LOW (ref 3.5–5.0)
Alkaline Phosphatase: 62 U/L (ref 38–126)
Anion gap: 7 (ref 5–15)
BUN: 18 mg/dL (ref 6–20)
CO2: 25 mmol/L (ref 22–32)
Calcium: 8.3 mg/dL — ABNORMAL LOW (ref 8.9–10.3)
Chloride: 106 mmol/L (ref 98–111)
Creatinine, Ser: 0.7 mg/dL (ref 0.44–1.00)
GFR, Estimated: >60 mL/min (ref >60)
Glucose, Bld: 104 mg/dL — ABNORMAL HIGH (ref 70–99)
Potassium: 4.2 mmol/L (ref 3.5–5.1)
Sodium: 138 mmol/L (ref 135–145)
Total Bilirubin: 0.7 mg/dL (ref 0.3–1.2)
Total Protein: 5.7 g/dL — ABNORMAL LOW (ref 6.5–8.1)

## 2020-12-16 LAB — MAGNESIUM: Magnesium: 2.4 mg/dL (ref 1.7–2.4)

## 2020-12-16 MED ORDER — LEVETIRACETAM 500 MG PO TABS: 500.0000 mg | ORAL_TABLET | Freq: Two times a day (BID) | ORAL | 2 refills | Status: DC

## 2020-12-16 NOTE — Progress Notes (Signed)
Nsg Discharge Note  Admit Date:  12/15/2020 Discharge date: 12/16/2020   Theresa Pace to be D/C'd Home per MD order.  AVS completed.  Copy for chart, and copy for patient signed, and dated. Patient/caregiver able to verbalize understanding.  Discharge Medication: Allergies as of 12/16/2020       Reactions   Methadone Hives   Aspirin Other (See Comments)   Stomach Pain        Medication List     TAKE these medications    albuterol 108 (90 Base) MCG/ACT inhaler Commonly known as: VENTOLIN HFA Inhale 2 puffs into the lungs every 6 (six) hours as needed for wheezing or shortness of breath.   amphetamine-dextroamphetamine 30 MG tablet Commonly known as: ADDERALL Take 30 mg by mouth 3 (three) times daily.   aspirin EC 81 MG tablet Take 81 mg by mouth daily.   baclofen 10 MG tablet Commonly known as: LIORESAL Take 5 mg by mouth 2 (two) times daily.   budesonide 0.5 MG/2ML nebulizer solution Commonly known as: Pulmicort Take 2 mLs (0.5 mg total) by nebulization daily. What changed:  when to take this reasons to take this   diltiazem 120 MG 24 hr capsule Commonly known as: DILACOR XR Take 120 mg by mouth daily.   halobetasol 0.05 % ointment Commonly known as: ULTRAVATE Apply 1 application topically daily as needed (rash).   HYDROcodone-acetaminophen 5-325 MG tablet Commonly known as: NORCO/VICODIN Take 1 tablet by mouth 2 (two) times daily.   ipratropium-albuterol 0.5-2.5 (3) MG/3ML Soln Commonly known as: DUONEB Take 3 mLs by nebulization 4 (four) times daily. What changed: when to take this   levETIRAcetam 500 MG tablet Commonly known as: Keppra Take 1 tablet (500 mg total) by mouth 2 (two) times daily.   oxybutynin 15 MG 24 hr tablet Commonly known as: DITROPAN XL Take 15 mg by mouth daily.   pantoprazole 40 MG tablet Commonly known as: PROTONIX Take 1 tablet (40 mg total) by mouth 2 (two) times daily before a meal.   risperiDONE 0.5 MG  tablet Commonly known as: RISPERDAL Take 0.5 mg by mouth 2 (two) times daily.   rosuvastatin 20 MG tablet Commonly known as: CRESTOR Take 20 mg by mouth daily.   traZODone 50 MG tablet Commonly known as: DESYREL Take 150 mg by mouth at bedtime.   Trelegy Ellipta 100-62.5-25 MCG/INH Aepb Generic drug: Fluticasone-Umeclidin-Vilant Inhale 1 puff into the lungs daily.        Discharge Assessment: Vitals:   12/16/20 0814 12/16/20 1318  BP:  132/67  Pulse:  67  Resp:  20  Temp:  98 F (36.7 C)  SpO2: 96% 96%   Skin clean, dry and intact without evidence of skin break down, no evidence of skin tears noted. IV catheter discontinued intact. Site without signs and symptoms of complications - no redness or edema noted at insertion site, patient denies c/o pain - only slight tenderness at site.  Dressing with slight pressure applied.  D/c Instructions-Education: Discharge instructions given to patient/family with verbalized understanding. D/c education completed with patient/family including follow up instructions, medication list, d/c activities limitations if indicated, with other d/c instructions as indicated by MD - patient able to verbalize understanding, all questions fully answered. Patient instructed to return to ED, call 911, or call MD for any changes in condition.  Patient escorted via [place], and D/C home via private auto.  Dorcas Mcmurray, LPN 10/18/4130 4:40 PM

## 2020-12-16 NOTE — Procedures (Signed)
Patient Name: Theresa Pace  MRN: [id number]  Epilepsy Attending: Lora Havens  Referring Physician/Provider: Dr Irwin Brakeman Date: 12/16/2020 Duration: 23.56 mins  Patient history: 60yo F with h/o epilepsy presented with breakthrough seizure. EEG to evaluate for seizure.   Level of alertness: Awake, asleep  AEDs during EEG study: LEV  Technical aspects: This EEG study was done with scalp electrodes positioned according to the 10-20 International system of electrode placement. Electrical activity was acquired at a sampling rate of 500Hz  and reviewed with a high frequency filter of 70Hz  and a low frequency filter of 1Hz . EEG data were recorded continuously and digitally stored.   Description: The posterior dominant rhythm consists of 8Hz  activity of moderate voltage (25-35 uV) seen predominantly in posterior head regions, symmetric and reactive to eye opening and eye closing. Sleep was characterized by vertex waves, sleep spindles (12 to 14 Hz), maximal frontocentral region. Hyperventilation and photic stimulation were not performed.     IMPRESSION: This study is within normal limits. No seizures or epileptiform discharges were seen throughout the recording.  Aniruddh Ciavarella Barbra Sarks

## 2020-12-16 NOTE — Progress Notes (Signed)
EEG complete - results pending 

## 2020-12-16 NOTE — Discharge Summary (Signed)
Physician Discharge Summary  Theresa Pace YQM:[id number] DOB: 1961-04-20 DOA: 12/15/2020  PCP: [facility]  Admit date: 12/15/2020  Discharge date: 12/16/2020  Admitted From: Home  Disposition: Home  Recommendations for Outpatient Follow-up:  Follow up with PCP in 1-2 weeks Follow-up with Dr. Merlene Laughter with neurology outpatient in 6 weeks Continue on Keppra 500 mg twice daily as noted below Continue on home medications as prior  Home Health: None  Equipment/Devices: None  Discharge Condition:Stable  CODE STATUS: Full  Diet recommendation: Heart Healthy  Brief/Interim Summary: Per HPI: Theresa Pace is a 60 y.o. female with medical history significant for prior hospitalization in 2020 for what was thought at the time unprovoked tonic-clonic seizures but possibly related to psychotropic medications.  She was treated with keppra 500 mg twice daily at the time.  Unfortunately she has been off of her seizure medication for at least the last year.  No known seizures in the last year.  Husband reports that she started having seizures at 5am. He says that he believes that it is associated with her taking "muscle relaxers."     This patient has a well documented history of chronic pain, fibromyalgia, opioid dependence, COPD, HTN, IBS, depression with psychosis, tachycardia (on cardizem), hyperlipidemia and arthritis.   -Patient was admitted with concern for acute metabolic encephalopathy, but this is simply related to seizure.  She has been seen by neurology with recommendations to continue on Keppra 500 mg twice daily to prevent seizure activity.  MRI brain with no acute findings noted.  She initially had some leukocytosis that was likely related to recent foot procedure, but this is resolving and should be followed up outpatient.  No other acute events noted throughout the course of the stay and EEG does not demonstrate any further seizure activity.  Discharge Diagnoses:   Principal Problem:   Altered mental state Active Problems:   Acute encephalopathy   Asthma   Depression   COPD (chronic obstructive pulmonary disease) (HCC)   GERD (gastroesophageal reflux disease)   Chronic pain   Hyperlipidemia   Acute metabolic encephalopathy   Leukocytosis   Myoclonic jerking   Polycythemia  Principal discharge diagnosis: Seizure secondary to medication noncompliance.  Discharge Instructions   Allergies as of 12/16/2020       Reactions   Methadone Hives   Aspirin Other (See Comments)   Stomach Pain        Medication List     TAKE these medications    albuterol 108 (90 Base) MCG/ACT inhaler Commonly known as: VENTOLIN HFA Inhale 2 puffs into the lungs every 6 (six) hours as needed for wheezing or shortness of breath.   amphetamine-dextroamphetamine 30 MG tablet Commonly known as: ADDERALL Take 30 mg by mouth 3 (three) times daily.   aspirin EC 81 MG tablet Take 81 mg by mouth daily.   baclofen 10 MG tablet Commonly known as: LIORESAL Take 5 mg by mouth 2 (two) times daily.   budesonide 0.5 MG/2ML nebulizer solution Commonly known as: Pulmicort Take 2 mLs (0.5 mg total) by nebulization daily. What changed:  when to take this reasons to take this   diltiazem 120 MG 24 hr capsule Commonly known as: DILACOR XR Take 120 mg by mouth daily.   halobetasol 0.05 % ointment Commonly known as: ULTRAVATE Apply 1 application topically daily as needed (rash).   HYDROcodone-acetaminophen 5-325 MG tablet Commonly known as: NORCO/VICODIN Take 1 tablet by mouth 2 (two) times daily.   ipratropium-albuterol 0.5-2.5 (3)  MG/3ML Soln Commonly known as: DUONEB Take 3 mLs by nebulization 4 (four) times daily. What changed: when to take this   levETIRAcetam 500 MG tablet Commonly known as: Keppra Take 1 tablet (500 mg total) by mouth 2 (two) times daily.   oxybutynin 15 MG 24 hr tablet Commonly known as: DITROPAN XL Take 15 mg by mouth  daily.   pantoprazole 40 MG tablet Commonly known as: PROTONIX Take 1 tablet (40 mg total) by mouth 2 (two) times daily before a meal.   risperiDONE 0.5 MG tablet Commonly known as: RISPERDAL Take 0.5 mg by mouth 2 (two) times daily.   rosuvastatin 20 MG tablet Commonly known as: CRESTOR Take 20 mg by mouth daily.   traZODone 50 MG tablet Commonly known as: DESYREL Take 150 mg by mouth at bedtime.   Trelegy Ellipta 100-62.5-25 MCG/INH Aepb Generic drug: Fluticasone-Umeclidin-Vilant Inhale 1 puff into the lungs daily.        Allergies  Allergen Reactions   Methadone Hives   Aspirin Other (See Comments)    Stomach Pain    Consultations: Neurology   Procedures/Studies: DG Chest 1 View  Result Date: 12/15/2020 CLINICAL DATA:  60 year old female with seizures since 0500 hours. Altered mental status. EXAM: CHEST  1 VIEW COMPARISON:  Low-dose screening chest CT 12/14/2020 and earlier. FINDINGS: AP supine view at 0611 hours. Lower lung volumes. Mediastinal contours remain within normal limits. Visualized tracheal air column is within normal limits. Allowing for portable technique the lungs are clear. No pneumothorax or pleural effusion. No acute osseous abnormality identified. IMPRESSION: Lower lung volumes.  No acute cardiopulmonary abnormality. Electronically Signed   By: Genevie Ann M.D.   On: 12/15/2020 06:43   DG Ankle Complete Left  Result Date: 12/07/2020 CLINICAL DATA:  Left foot and ankle pain after injury today EXAM: LEFT ANKLE COMPLETE - 3+ VIEW; LEFT FOOT - COMPLETE 3+ VIEW COMPARISON:  None. FINDINGS: No acute fracture or dislocation of the left ankle. Ankle mortise is congruent without widening. Large os trigonum. Pes cavovarus alignment. No evidence of acute fracture or dislocation within the left foot. Mild soft tissue prominence diffusely. IMPRESSION: No acute fracture or dislocation of the left foot or ankle. Electronically Signed   By: Davina Poke D.O.   On:  12/07/2020 18:42   CT Head Wo Contrast  Result Date: 12/15/2020 CLINICAL DATA:  60 year old female with seizures since 0500 hours. Altered mental status. EXAM: CT HEAD WITHOUT CONTRAST TECHNIQUE: Contiguous axial images were obtained from the base of the skull through the vertex without intravenous contrast. COMPARISON:  Brain MRI and head CT 12/26/2018. FINDINGS: Brain: Stable non contrast CT appearance of the brain. No midline shift, ventriculomegaly, mass effect, evidence of mass lesion, intracranial hemorrhage or evidence of cortically based acute infarction. Gray-white matter differentiation is within normal limits throughout the brain. Vascular: No suspicious intracranial vascular hyperdensity. Skull: Stable hyperostosis. No acute osseous abnormality identified. Sinuses/Orbits: Left sphenoid sinus fluid or mucosal thickening has only mildly increased since 2020. Other Visualized paranasal sinuses and mastoids are stable and well aerated. Other: Negative orbit and scalp soft tissues. IMPRESSION: 1. Stable and negative noncontrast CT appearance of the brain. 2. Mild chronic left sphenoid sinus disease. Electronically Signed   By: Genevie Ann M.D.   On: 12/15/2020 06:41   MR BRAIN WO CONTRAST  Result Date: 12/15/2020 CLINICAL DATA:  Mental status change, unknown cause. Altered mental status. EXAM: MRI HEAD WITHOUT CONTRAST TECHNIQUE: Multiplanar, multiecho pulse sequences of the brain and surrounding  structures were obtained without intravenous contrast. COMPARISON:  Head CT same day.  MRI 12/26/2018. FINDINGS: Brain: Diffusion imaging does not show any acute or subacute infarction or other cause of restricted diffusion. The brainstem and cerebellum are normal. Cerebral hemispheres are normal. No evidence of old or recent small or large vessel infarction, mass lesion, hemorrhage, hydrocephalus or extra-axial collection. Mesial temporal lobes appear normal. Vascular: Major vessels at the base of the brain show  flow. Skull and upper cervical spine: Negative Sinuses/Orbits: Clear/normal Other: None IMPRESSION: Normal MRI of the brain. Electronically Signed   By: Nelson Chimes M.D.   On: 12/15/2020 09:02   DG Foot Complete Left  Result Date: 12/07/2020 CLINICAL DATA:  Left foot and ankle pain after injury today EXAM: LEFT ANKLE COMPLETE - 3+ VIEW; LEFT FOOT - COMPLETE 3+ VIEW COMPARISON:  None. FINDINGS: No acute fracture or dislocation of the left ankle. Ankle mortise is congruent without widening. Large os trigonum. Pes cavovarus alignment. No evidence of acute fracture or dislocation within the left foot. Mild soft tissue prominence diffusely. IMPRESSION: No acute fracture or dislocation of the left foot or ankle. Electronically Signed   By: Davina Poke D.O.   On: 12/07/2020 18:42   DG Foot Complete Right  Result Date: 12/15/2020 CLINICAL DATA:  Redness and swelling.  Missing toenails. EXAM: RIGHT FOOT COMPLETE - 3+ VIEW COMPARISON:  Right foot series 07/19/2018. FINDINGS: Diffuse degenerative change. Old nonunited cuboid fracture cannot be excluded. Old nonunited medial malleolar fracture. No acute bony or joint abnormality identified. No focal bony erosion noted. No radiopaque foreign body. IMPRESSION: 1. Diffuse degenerative change. No acute bony abnormality. No radiopaque foreign body. 2. Old nonunited cuboid fracture cannot be excluded. Old nonunited medial malleolar fracture. Electronically Signed   By: Marcello Moores  Register   On: 12/15/2020 08:37     Discharge Exam: Vitals:   12/16/20 0814 12/16/20 1318  BP:  132/67  Pulse:  67  Resp:  20  Temp:  98 F (36.7 C)  SpO2: 96% 96%   Vitals:   12/16/20 0237 12/16/20 0639 12/16/20 0814 12/16/20 1318  BP: (!) 145/69 (!) 146/98  132/67  Pulse: 63 85  67  Resp: 16 19  20   Temp: 97.9 F (36.6 C) 97.9 F (36.6 C)  98 F (36.7 C)  TempSrc: Oral Oral  Oral  SpO2: 94% 95% 96% 96%  Weight:      Height:        General: Pt is alert, awake, not in  acute distress Cardiovascular: RRR, S1/S2 +, no rubs, no gallops Respiratory: CTA bilaterally, no wheezing, no rhonchi Abdominal: Soft, NT, ND, bowel sounds + Extremities: no edema, no cyanosis    The results of significant diagnostics from this hospitalization (including imaging, microbiology, ancillary and laboratory) are listed below for reference.     Microbiology: Recent Results (from the past 240 hour(s))  Resp Panel by RT-PCR (Flu A&B, Covid) Nasopharyngeal Swab     Status: None   Collection Time: 12/15/20  8:16 AM   Specimen: Nasopharyngeal Swab; Nasopharyngeal(NP) swabs in vial transport medium  Result Value Ref Range Status   SARS Coronavirus 2 by RT PCR NEGATIVE NEGATIVE Final    Comment: (NOTE) SARS-CoV-2 target nucleic acids are NOT DETECTED.  The SARS-CoV-2 RNA is generally detectable in upper respiratory specimens during the acute phase of infection. The lowest concentration of SARS-CoV-2 viral copies this assay can detect is 138 copies/mL. A negative result does not preclude SARS-Cov-2 infection and should not  be used as the sole basis for treatment or other patient management decisions. A negative result may occur with  improper specimen collection/handling, submission of specimen other than nasopharyngeal swab, presence of viral mutation(s) within the areas targeted by this assay, and inadequate number of viral copies(<138 copies/mL). A negative result must be combined with clinical observations, patient history, and epidemiological information. The expected result is Negative.  Fact Sheet for Patients:  EntrepreneurPulse.com.au  Fact Sheet for Healthcare Providers:  IncredibleEmployment.be  This test is no t yet approved or cleared by the [place] FDA and  has been authorized for detection and/or diagnosis of SARS-CoV-2 by FDA under an Emergency Use Authorization (EUA). This EUA will remain  in effect (meaning this  test can be used) for the duration of the COVID-19 declaration under Section 564(b)(1) of the Act, 21 U.S.C.section 360bbb-3(b)(1), unless the authorization is terminated  or revoked sooner.       Influenza A by PCR NEGATIVE NEGATIVE Final   Influenza B by PCR NEGATIVE NEGATIVE Final    Comment: (NOTE) The Xpert Xpress SARS-CoV-2/FLU/RSV plus assay is intended as an aid in the diagnosis of influenza from Nasopharyngeal swab specimens and should not be used as a sole basis for treatment. Nasal washings and aspirates are unacceptable for Xpert Xpress SARS-CoV-2/FLU/RSV testing.  Fact Sheet for Patients: EntrepreneurPulse.com.au  Fact Sheet for Healthcare Providers: IncredibleEmployment.be  This test is not yet approved or cleared by the [place] FDA and has been authorized for detection and/or diagnosis of SARS-CoV-2 by FDA under an Emergency Use Authorization (EUA). This EUA will remain in effect (meaning this test can be used) for the duration of the COVID-19 declaration under Section 564(b)(1) of the Act, 21 U.S.C. section 360bbb-3(b)(1), unless the authorization is terminated or revoked.  Performed at [facility], [address] [place], [place] [address]   MRSA Next Gen by PCR, Nasal     Status: None   Collection Time: 12/15/20  3:16 PM   Specimen: Nasal Mucosa; Nasal Swab  Result Value Ref Range Status   MRSA by PCR Next Gen NOT DETECTED NOT DETECTED Final    Comment: (NOTE) The GeneXpert MRSA Assay (FDA approved for NASAL specimens only), is one component of a comprehensive MRSA colonization surveillance program. It is not intended to diagnose MRSA infection nor to guide or monitor treatment for MRSA infections. Test performance is not FDA approved in patients less than 39 years old. Performed at [facility], [address], [place], [place] [address]      Labs: BNP (last 3 results) No results for input(s): BNP in the last  8760 hours. Basic Metabolic Panel: Recent Labs  Lab 12/15/20 0556 12/16/20 0401  NA 138 138  K 3.6 4.2  CL 102 106  CO2 26 25  GLUCOSE 152* 104*  BUN 22* 18  CREATININE 0.82 0.70  CALCIUM 8.4* 8.3*  MG 2.1 2.4   Liver Function Tests: Recent Labs  Lab 12/15/20 0556 12/16/20 0401  AST 21 18  ALT 29 26  ALKPHOS 83 62  BILITOT 0.7 0.7  PROT 3.9* 5.7*  ALBUMIN 4.1 3.2*   No results for input(s): LIPASE, AMYLASE in the last 168 hours. No results for input(s): AMMONIA in the last 168 hours. CBC: Recent Labs  Lab 12/15/20 0556 12/16/20 0401  WBC 29.0* 12.3*  NEUTROABS 23.1* 8.6*  HGB 16.3* 13.9  HCT 49.5* 42.6  MCV 95.0 96.4  PLT 318 194   Cardiac Enzymes: Recent Labs  Lab 12/15/20  0556  CKTOTAL 30*   BNP: Invalid input(s): POCBNP CBG: Recent Labs  Lab 12/15/20 0555  GLUCAP 166*   D-Dimer No results for input(s): DDIMER in the last 72 hours. Hgb A1c No results for input(s): HGBA1C in the last 72 hours. Lipid Profile No results for input(s): CHOL, HDL, LDLCALC, TRIG, CHOLHDL, LDLDIRECT in the last 72 hours. Thyroid function studies No results for input(s): TSH, T4TOTAL, T3FREE, THYROIDAB in the last 72 hours.  Invalid input(s): FREET3 Anemia work up No results for input(s): VITAMINB12, FOLATE, FERRITIN, TIBC, IRON, RETICCTPCT in the last 72 hours. Urinalysis    Component Value Date/Time   COLORURINE YELLOW 12/15/2020 0817   APPEARANCEUR CLEAR 12/15/2020 0817   LABSPEC 1.021 12/15/2020 0817   PHURINE 5.0 12/15/2020 0817   GLUCOSEU NEGATIVE 12/15/2020 0817   HGBUR MODERATE (A) 12/15/2020 0817   BILIRUBINUR NEGATIVE 12/15/2020 0817   KETONESUR NEGATIVE 12/15/2020 0817   PROTEINUR NEGATIVE 12/15/2020 0817   UROBILINOGEN 0.2 02/21/2012 0055   NITRITE NEGATIVE 12/15/2020 0817   LEUKOCYTESUR NEGATIVE 12/15/2020 0817   Sepsis Labs Invalid input(s): PROCALCITONIN,  WBC,  LACTICIDVEN Microbiology Recent Results (from the past 240 hour(s))  Resp  Panel by RT-PCR (Flu A&B, Covid) Nasopharyngeal Swab     Status: None   Collection Time: 12/15/20  8:16 AM   Specimen: Nasopharyngeal Swab; Nasopharyngeal(NP) swabs in vial transport medium  Result Value Ref Range Status   SARS Coronavirus 2 by RT PCR NEGATIVE NEGATIVE Final    Comment: (NOTE) SARS-CoV-2 target nucleic acids are NOT DETECTED.  The SARS-CoV-2 RNA is generally detectable in upper respiratory specimens during the acute phase of infection. The lowest concentration of SARS-CoV-2 viral copies this assay can detect is 138 copies/mL. A negative result does not preclude SARS-Cov-2 infection and should not be used as the sole basis for treatment or other patient management decisions. A negative result may occur with  improper specimen collection/handling, submission of specimen other than nasopharyngeal swab, presence of viral mutation(s) within the areas targeted by this assay, and inadequate number of viral copies(<138 copies/mL). A negative result must be combined with clinical observations, patient history, and epidemiological information. The expected result is Negative.  Fact Sheet for Patients:  EntrepreneurPulse.com.au  Fact Sheet for Healthcare Providers:  IncredibleEmployment.be  This test is no t yet approved or cleared by the [place] FDA and  has been authorized for detection and/or diagnosis of SARS-CoV-2 by FDA under an Emergency Use Authorization (EUA). This EUA will remain  in effect (meaning this test can be used) for the duration of the COVID-19 declaration under Section 564(b)(1) of the Act, 21 U.S.C.section 360bbb-3(b)(1), unless the authorization is terminated  or revoked sooner.       Influenza A by PCR NEGATIVE NEGATIVE Final   Influenza B by PCR NEGATIVE NEGATIVE Final    Comment: (NOTE) The Xpert Xpress SARS-CoV-2/FLU/RSV plus assay is intended as an aid in the diagnosis of influenza from Nasopharyngeal  swab specimens and should not be used as a sole basis for treatment. Nasal washings and aspirates are unacceptable for Xpert Xpress SARS-CoV-2/FLU/RSV testing.  Fact Sheet for Patients: EntrepreneurPulse.com.au  Fact Sheet for Healthcare Providers: IncredibleEmployment.be  This test is not yet approved or cleared by the [place] FDA and has been authorized for detection and/or diagnosis of SARS-CoV-2 by FDA under an Emergency Use Authorization (EUA). This EUA will remain in effect (meaning this test can be used) for the duration of the COVID-19 declaration under Section 564(b)(1) of the Act,  21 U.S.C. section 360bbb-3(b)(1), unless the authorization is terminated or revoked.  Performed at [facility], [address], [place], [place] [address]   MRSA Next Gen by PCR, Nasal     Status: None   Collection Time: 12/15/20  3:16 PM   Specimen: Nasal Mucosa; Nasal Swab  Result Value Ref Range Status   MRSA by PCR Next Gen NOT DETECTED NOT DETECTED Final    Comment: (NOTE) The GeneXpert MRSA Assay (FDA approved for NASAL specimens only), is one component of a comprehensive MRSA colonization surveillance program. It is not intended to diagnose MRSA infection nor to guide or monitor treatment for MRSA infections. Test performance is not FDA approved in patients less than 102 years old. Performed at [facility], [address] [place], [place] [address]      Time coordinating discharge: 35 minutes  SIGNED:   Rodena Goldmann, DO [facility] 12/16/2020, 3:44 PM  If 7PM-7AM, please contact night-coverage [email or web address]

## 2020-12-16 NOTE — Progress Notes (Signed)
Told patient that she had been downgraded and would be moved to telemetry tonight.  Patient was agreeable with this, but wanted to know if she was moving upstairs if she would be able to get her pain pill that she takes at home.  Explained to patient that it would be up to the doctor, but that the doctor tonight had already given her a one time dose of ultram and did not feel comfortable adding her regular medication back at this time.  She would need to discuss that with her daytime doctor.

## 2020-12-17 LAB — PATHOLOGIST SMEAR REVIEW

## 2021-05-11 ENCOUNTER — Ambulatory Visit: Payer: Medicaid Other | Admitting: Gastroenterology

## 2021-05-11 ENCOUNTER — Encounter: Payer: Self-pay | Admitting: Gastroenterology

## 2021-05-11 ENCOUNTER — Other Ambulatory Visit: Payer: Self-pay

## 2021-05-11 ENCOUNTER — Ambulatory Visit: Payer: Medicaid Other | Admitting: Internal Medicine

## 2021-05-11 VITALS — BP 119/78 | HR 91 | Temp 97.1°F | Ht 62.0 in | Wt 172.8 lb

## 2021-05-11 DIAGNOSIS — K580 Irritable bowel syndrome with diarrhea: Secondary | ICD-10-CM | POA: Diagnosis not present

## 2021-05-11 DIAGNOSIS — K625 Hemorrhage of anus and rectum: Secondary | ICD-10-CM

## 2021-05-11 DIAGNOSIS — Z86010 Personal history of colonic polyps: Secondary | ICD-10-CM

## 2021-05-11 MED ORDER — CLENPIQ 10-3.5-12 MG-GM -GM/160ML PO SOLN: 1.0000 | Freq: Once | ORAL | 0 refills | Status: AC

## 2021-05-11 MED ORDER — HYDROCORTISONE (PERIANAL) 2.5 % EX CREA: 1.0000 "application " | TOPICAL_CREAM | Freq: Two times a day (BID) | CUTANEOUS | 1 refills | Status: DC

## 2021-05-11 NOTE — H&P (View-Only) (Signed)
Primary Care Physician:  [facility] Referring Physician: [place] Primary Gastroenterologist:  Dr. Gala Romney  Chief Complaint  Patient presents with   Rectal Bleeding    Going on over 1 year, leaks blood throughout the day, bowels with blood also.    HPI:   Theresa Pace is a 60 y.o. female presenting today at the request of [place] due to rectal bleeding. She was last seen here in 2020 with plans for colonoscopy. Last colonoscopy in 2016 in [place] with tubular adenomas, diverticulosis, and internal hemorrhoids.   Notes prolapsing hemorrhoids. Rectal bleeding with every BM. History of IBS. Has 3-4 loose stools a day. Chronic. Takes OTC anti-diarrheal medication like "candy". Dicyclomine didn't help. Has abdominal cramping followed by diarrhea.    Past Medical History:  Diagnosis Date   Arthritis    Asthma    COPD (chronic obstructive pulmonary disease) ([place])    Depression    patient states she is no longer depressed   Fibromyalgia    Frequent urination at night    GERD (gastroesophageal reflux disease)    Hypercholesteremia    IBS (irritable bowel syndrome)    Tachycardia    treated with Cardizem   Tendonitis     Past Surgical History:  Procedure Laterality Date   ABDOMINAL HYSTERECTOMY     APPENDECTOMY     COLONOSCOPY  05/2014   2 sessile polyps, 3-7 mm in size (Tubular adenomas) and internal hemorrhoids. diverticulosis   KNEE ARTHROPLASTY Right 05/23/2017   skin cancer removal from back      Current Outpatient Medications  Medication Sig Dispense Refill   albuterol (PROVENTIL HFA;VENTOLIN HFA) 108 (90 BASE) MCG/ACT inhaler Inhale 2 puffs into the lungs every 6 (six) hours as needed for wheezing or shortness of breath.      amphetamine-dextroamphetamine (ADDERALL) 30 MG tablet Take 30 mg by mouth 3 (three) times daily.     aspirin EC 81 MG tablet Take 81 mg by mouth daily.     baclofen (LIORESAL) 10 MG tablet  Take 5 mg by mouth 2 (two) times daily.     budesonide (PULMICORT) 0.5 MG/2ML nebulizer solution Take 2 mLs (0.5 mg total) by nebulization daily. 50 mL 0   diltiazem (DILACOR XR) 120 MG 24 hr capsule Take 120 mg by mouth daily.     halobetasol (ULTRAVATE) 0.05 % ointment Apply 1 application topically daily as needed (rash).     HYDROcodone-acetaminophen (NORCO) 7.5-325 MG tablet Take 1 tablet by mouth in the morning, at noon, and at bedtime.     ipratropium-albuterol (DUONEB) 0.5-2.5 (3) MG/3ML SOLN Take 3 mLs by nebulization 4 (four) times daily.     oxybutynin (DITROPAN XL) 15 MG 24 hr tablet Take 15 mg by mouth daily.     pantoprazole (PROTONIX) 40 MG tablet Take 1 tablet (40 mg total) by mouth 2 (two) times daily before a meal. 60 tablet 3   risperiDONE (RISPERDAL) 0.5 MG tablet Take 0.5 mg by mouth 2 (two) times daily.     rosuvastatin (CRESTOR) 20 MG tablet Take 20 mg by mouth daily.     traZODone (DESYREL) 50 MG tablet Take 150 mg by mouth at bedtime.      TRELEGY ELLIPTA 100-62.5-25 MCG/INH AEPB Inhale 1 puff into the lungs daily.     No current facility-administered medications for this visit.    Allergies as of 05/11/2021 - Review Complete 05/11/2021  Allergen Reaction Noted   Methadone  Hives 11/21/2012   Aspirin Other (See Comments) 02/21/2012    Family History  Problem Relation Age of Onset   Lung cancer Mother        deceased at 58   Diabetes Brother    Colon cancer Neg Hx     Social History   Socioeconomic History   Marital status: Married    Spouse name: Not on file   Number of children: Not on file   Years of education: Not on file   Highest education level: Not on file  Occupational History   Not on file  Tobacco Use   Smoking status: Every Day    Packs/day: 0.25    Types: Cigarettes    Last attempt to quit: 05/07/2019    Years since quitting: 2.0   Smokeless tobacco: Never  Vaping Use   Vaping Use: Never used  Substance and Sexual Activity   Alcohol  use: No   Drug use: No   Sexual activity: Yes    Birth control/protection: Surgical  Other Topics Concern   Not on file  Social History Narrative   Not on file   Social Determinants of Health   Financial Resource Strain: Not on file  Food Insecurity: Not on file  Transportation Needs: Not on file  Physical Activity: Not on file  Stress: Not on file  Social Connections: Not on file  Intimate Partner Violence: Not on file    Review of Systems: Gen: Denies any fever, chills, fatigue, weight loss, lack of appetite.  CV: Denies chest pain, heart palpitations, peripheral edema, syncope.  Resp: Denies shortness of breath at rest or with exertion. Denies wheezing or cough.  GI: see HPI GU : Denies urinary burning, urinary frequency, urinary hesitancy MS: Denies joint pain, muscle weakness, cramps, or limitation of movement.  Derm: Denies rash, itching, dry skin Psych: Denies depression, anxiety, memory loss, and confusion Heme: see HPI  Physical Exam: BP 119/78    Pulse 91    Temp (!) 97.1 F (36.2 C)    Ht 5\' 2"  (1.575 m)    Wt 172 lb 12.8 oz (78.4 kg)    BMI 31.61 kg/m  General:   Alert and oriented. Pleasant and cooperative. Well-nourished and well-developed.  Head:  Normocephalic and atraumatic. Eyes:  Without icterus, sclera clear and conjunctiva pink.  Ears:  Normal auditory acuity. Mouth:  mask in place Lungs:  Clear to auscultation bilaterally. No wheezes, rales, or rhonchi. No distress.  Heart:  S1, S2 present without murmurs appreciated.  Abdomen:  +BS, soft, non-tender and non-distended. No HSM noted. No guarding or rebound. No masses appreciated.  Rectal:  Declined  Msk:  Symmetrical without gross deformities. Normal posture. Extremities:  Without edema. Neurologic:  Alert and  oriented x4;  grossly normal neurologically. Skin:  Intact without significant lesions or rashes. Psych:  Alert and cooperative. Normal mood and affect.  ASSESSMENT: Theresa Pace is a 60  y.o. female presenting today at the request of [place] due to rectal bleeding. She was last seen here in 2020 with plans for colonoscopy. Last colonoscopy in 2016 in [place] with tubular adenomas, diverticulosis, and internal hemorrhoids. No family history of colorectal cancer.  Likely bleeding due to hemorrhoids. However, she is overdue for colonoscopy with history of adenomas.   Long-standing history of IBS-D: failed dicyclomine and OTC agents. Will trial Viberzi as gallbladder still present and no contraindications. I have given her samples of 75 mg to take once to twice daily  with food. Discussed potential side effects.   PLAN: Proceed with colonoscopy by Dr. Gala Romney in near future with Propofol: the risks, benefits, and alternatives have been discussed with the patient in detail. The patient states understanding and desires to proceed.  Trial of Viberzi 75 mg daily to BID with food  Anusol cream sent to pharmacy  Call with update  Return in follow-up thereafter  Annitta Needs, PhD, ANP-BC [facility] Gastroenterology

## 2021-05-11 NOTE — Progress Notes (Signed)
Primary Care Physician:  [facility] Referring Physician: [place] Primary Gastroenterologist:  Dr. Gala Romney  Chief Complaint  Patient presents with   Rectal Bleeding    Going on over 1 year, leaks blood throughout the day, bowels with blood also.    HPI:   Theresa Pace is a 60 y.o. female presenting today at the request of [place] due to rectal bleeding. She was last seen here in 2020 with plans for colonoscopy. Last colonoscopy in 2016 in [place] with tubular adenomas, diverticulosis, and internal hemorrhoids.   Notes prolapsing hemorrhoids. Rectal bleeding with every BM. History of IBS. Has 3-4 loose stools a day. Chronic. Takes OTC anti-diarrheal medication like "candy". Dicyclomine didn't help. Has abdominal cramping followed by diarrhea.    Past Medical History:  Diagnosis Date   Arthritis    Asthma    COPD (chronic obstructive pulmonary disease) ([place])    Depression    patient states she is no longer depressed   Fibromyalgia    Frequent urination at night    GERD (gastroesophageal reflux disease)    Hypercholesteremia    IBS (irritable bowel syndrome)    Tachycardia    treated with Cardizem   Tendonitis     Past Surgical History:  Procedure Laterality Date   ABDOMINAL HYSTERECTOMY     APPENDECTOMY     COLONOSCOPY  05/2014   2 sessile polyps, 3-7 mm in size (Tubular adenomas) and internal hemorrhoids. diverticulosis   KNEE ARTHROPLASTY Right 05/23/2017   skin cancer removal from back      Current Outpatient Medications  Medication Sig Dispense Refill   albuterol (PROVENTIL HFA;VENTOLIN HFA) 108 (90 BASE) MCG/ACT inhaler Inhale 2 puffs into the lungs every 6 (six) hours as needed for wheezing or shortness of breath.      amphetamine-dextroamphetamine (ADDERALL) 30 MG tablet Take 30 mg by mouth 3 (three) times daily.     aspirin EC 81 MG tablet Take 81 mg by mouth daily.     baclofen (LIORESAL) 10 MG tablet  Take 5 mg by mouth 2 (two) times daily.     budesonide (PULMICORT) 0.5 MG/2ML nebulizer solution Take 2 mLs (0.5 mg total) by nebulization daily. 50 mL 0   diltiazem (DILACOR XR) 120 MG 24 hr capsule Take 120 mg by mouth daily.     halobetasol (ULTRAVATE) 0.05 % ointment Apply 1 application topically daily as needed (rash).     HYDROcodone-acetaminophen (NORCO) 7.5-325 MG tablet Take 1 tablet by mouth in the morning, at noon, and at bedtime.     ipratropium-albuterol (DUONEB) 0.5-2.5 (3) MG/3ML SOLN Take 3 mLs by nebulization 4 (four) times daily.     oxybutynin (DITROPAN XL) 15 MG 24 hr tablet Take 15 mg by mouth daily.     pantoprazole (PROTONIX) 40 MG tablet Take 1 tablet (40 mg total) by mouth 2 (two) times daily before a meal. 60 tablet 3   risperiDONE (RISPERDAL) 0.5 MG tablet Take 0.5 mg by mouth 2 (two) times daily.     rosuvastatin (CRESTOR) 20 MG tablet Take 20 mg by mouth daily.     traZODone (DESYREL) 50 MG tablet Take 150 mg by mouth at bedtime.      TRELEGY ELLIPTA 100-62.5-25 MCG/INH AEPB Inhale 1 puff into the lungs daily.     No current facility-administered medications for this visit.    Allergies as of 05/11/2021 - Review Complete 05/11/2021  Allergen Reaction Noted   Methadone  Hives 11/21/2012   Aspirin Other (See Comments) 02/21/2012    Family History  Problem Relation Age of Onset   Lung cancer Mother        deceased at 75   Diabetes Brother    Colon cancer Neg Hx     Social History   Socioeconomic History   Marital status: Married    Spouse name: Not on file   Number of children: Not on file   Years of education: Not on file   Highest education level: Not on file  Occupational History   Not on file  Tobacco Use   Smoking status: Every Day    Packs/day: 0.25    Types: Cigarettes    Last attempt to quit: 05/07/2019    Years since quitting: 2.0   Smokeless tobacco: Never  Vaping Use   Vaping Use: Never used  Substance and Sexual Activity   Alcohol  use: No   Drug use: No   Sexual activity: Yes    Birth control/protection: Surgical  Other Topics Concern   Not on file  Social History Narrative   Not on file   Social Determinants of Health   Financial Resource Strain: Not on file  Food Insecurity: Not on file  Transportation Needs: Not on file  Physical Activity: Not on file  Stress: Not on file  Social Connections: Not on file  Intimate Partner Violence: Not on file    Review of Systems: Gen: Denies any fever, chills, fatigue, weight loss, lack of appetite.  CV: Denies chest pain, heart palpitations, peripheral edema, syncope.  Resp: Denies shortness of breath at rest or with exertion. Denies wheezing or cough.  GI: see HPI GU : Denies urinary burning, urinary frequency, urinary hesitancy MS: Denies joint pain, muscle weakness, cramps, or limitation of movement.  Derm: Denies rash, itching, dry skin Psych: Denies depression, anxiety, memory loss, and confusion Heme: see HPI  Physical Exam: BP 119/78    Pulse 91    Temp (!) 97.1 F (36.2 C)    Ht 5\' 2"  (1.575 m)    Wt 172 lb 12.8 oz (78.4 kg)    BMI 31.61 kg/m  General:   Alert and oriented. Pleasant and cooperative. Well-nourished and well-developed.  Head:  Normocephalic and atraumatic. Eyes:  Without icterus, sclera clear and conjunctiva pink.  Ears:  Normal auditory acuity. Mouth:  mask in place Lungs:  Clear to auscultation bilaterally. No wheezes, rales, or rhonchi. No distress.  Heart:  S1, S2 present without murmurs appreciated.  Abdomen:  +BS, soft, non-tender and non-distended. No HSM noted. No guarding or rebound. No masses appreciated.  Rectal:  Declined  Msk:  Symmetrical without gross deformities. Normal posture. Extremities:  Without edema. Neurologic:  Alert and  oriented x4;  grossly normal neurologically. Skin:  Intact without significant lesions or rashes. Psych:  Alert and cooperative. Normal mood and affect.  ASSESSMENT: Theresa Pace is a 60  y.o. female presenting today at the request of [place] due to rectal bleeding. She was last seen here in 2020 with plans for colonoscopy. Last colonoscopy in 2016 in [place] with tubular adenomas, diverticulosis, and internal hemorrhoids. No family history of colorectal cancer.  Likely bleeding due to hemorrhoids. However, she is overdue for colonoscopy with history of adenomas.   Long-standing history of IBS-D: failed dicyclomine and OTC agents. Will trial Viberzi as gallbladder still present and no contraindications. I have given her samples of 75 mg to take once to twice daily  with food. Discussed potential side effects.   PLAN: Proceed with colonoscopy by Dr. Gala Romney in near future with Propofol: the risks, benefits, and alternatives have been discussed with the patient in detail. The patient states understanding and desires to proceed.  Trial of Viberzi 75 mg daily to BID with food  Anusol cream sent to pharmacy  Call with update  Return in follow-up thereafter  Annitta Needs, PhD, ANP-BC [facility] Gastroenterology

## 2021-05-11 NOTE — Patient Instructions (Addendum)
Please have blood work done today.  To help with your diarrhea, I have given you samples called Viberzi. Take one tablet with food once daily. You can increase this to twice a day with food if you tolerate it well. Watch for severe abdominal pain, nausea, and vomiting. Make sure not to drink any alcohol while taking this. We can always increase the dosage if needed. Let us know how this works for you!  I have sent in a cream to your pharmacy to use per rectum twice a day.   We are arranging a colonoscopy with Dr. Gala Romney in the near future!  It was a pleasure to see you today. I want to create trusting relationships with patients to provide genuine, compassionate, and quality care. I value your feedback. If you receive a survey regarding your visit,  I greatly appreciate you taking time to fill this out.   Annitta Needs, PhD, ANP-BC [facility] Gastroenterology

## 2021-05-31 ENCOUNTER — Other Ambulatory Visit: Payer: Self-pay

## 2021-05-31 ENCOUNTER — Ambulatory Visit (HOSPITAL_COMMUNITY): Payer: Medicaid Other | Admitting: Anesthesiology

## 2021-05-31 ENCOUNTER — Encounter (HOSPITAL_COMMUNITY): Payer: Self-pay | Admitting: Internal Medicine

## 2021-05-31 ENCOUNTER — Ambulatory Visit (HOSPITAL_COMMUNITY)
Admission: RE | Admit: 2021-05-31 | Discharge: 2021-05-31 | Disposition: A | Discharge status: Home / Self Care | Payer: Medicaid Other | Attending: Internal Medicine | Admitting: Internal Medicine

## 2021-05-31 ENCOUNTER — Encounter (HOSPITAL_COMMUNITY)
Admission: RE | Disposition: A | Discharge status: Home / Self Care | Payer: Self-pay | Source: Home / Self Care | Attending: Internal Medicine

## 2021-05-31 DIAGNOSIS — K620 Anal polyp: Secondary | ICD-10-CM | POA: Insufficient documentation

## 2021-05-31 DIAGNOSIS — G709 Myoneural disorder, unspecified: Secondary | ICD-10-CM | POA: Insufficient documentation

## 2021-05-31 DIAGNOSIS — D124 Benign neoplasm of descending colon: Secondary | ICD-10-CM | POA: Diagnosis not present

## 2021-05-31 DIAGNOSIS — K219 Gastro-esophageal reflux disease without esophagitis: Secondary | ICD-10-CM | POA: Insufficient documentation

## 2021-05-31 DIAGNOSIS — K921 Melena: Secondary | ICD-10-CM | POA: Diagnosis not present

## 2021-05-31 DIAGNOSIS — K635 Polyp of colon: Secondary | ICD-10-CM | POA: Diagnosis not present

## 2021-05-31 DIAGNOSIS — Z79899 Other long term (current) drug therapy: Secondary | ICD-10-CM | POA: Insufficient documentation

## 2021-05-31 DIAGNOSIS — K589 Irritable bowel syndrome without diarrhea: Secondary | ICD-10-CM | POA: Insufficient documentation

## 2021-05-31 DIAGNOSIS — F419 Anxiety disorder, unspecified: Secondary | ICD-10-CM | POA: Insufficient documentation

## 2021-05-31 DIAGNOSIS — D120 Benign neoplasm of cecum: Secondary | ICD-10-CM | POA: Diagnosis not present

## 2021-05-31 DIAGNOSIS — K648 Other hemorrhoids: Secondary | ICD-10-CM | POA: Insufficient documentation

## 2021-05-31 DIAGNOSIS — K529 Noninfective gastroenteritis and colitis, unspecified: Secondary | ICD-10-CM | POA: Insufficient documentation

## 2021-05-31 DIAGNOSIS — J449 Chronic obstructive pulmonary disease, unspecified: Secondary | ICD-10-CM | POA: Insufficient documentation

## 2021-05-31 DIAGNOSIS — F17210 Nicotine dependence, cigarettes, uncomplicated: Secondary | ICD-10-CM | POA: Insufficient documentation

## 2021-05-31 HISTORY — PX: BIOPSY: SHX5522

## 2021-05-31 HISTORY — PX: COLONOSCOPY WITH PROPOFOL: SHX5780

## 2021-05-31 HISTORY — PX: POLYPECTOMY: SHX5525

## 2021-05-31 SURGERY — COLONOSCOPY WITH PROPOFOL
Anesthesia: General

## 2021-05-31 MED ORDER — PROPOFOL 10 MG/ML IV BOLUS
INTRAVENOUS | Status: DC | PRN
Start: 2021-05-31 — End: 2021-05-31
  Administered 2021-05-31: 40 mg via INTRAVENOUS
  Administered 2021-05-31: 150 mg via INTRAVENOUS

## 2021-05-31 MED ORDER — LACTATED RINGERS IV SOLN: INTRAVENOUS | Status: DC

## 2021-05-31 MED ORDER — PROPOFOL 500 MG/50ML IV EMUL
INTRAVENOUS | Status: DC | PRN
  Administered 2021-05-31: 75 ug/kg/min via INTRAVENOUS

## 2021-05-31 MED ORDER — LIDOCAINE HCL (CARDIAC) PF 50 MG/5ML IV SOSY
PREFILLED_SYRINGE | INTRAVENOUS | Status: DC | PRN
  Administered 2021-05-31: 50 mg via INTRAVENOUS

## 2021-05-31 NOTE — Op Note (Signed)
[facility] Patient Name: Theresa Pace Procedure Date: 05/31/2021 12:43 PM MRN: [id number] Date of Birth: 1960-09-30 Attending MD: Norvel Richards , MD CSN: [id number] Age: 61 Admit Type: Outpatient Procedure:                Colonoscopy Indications:              Chronic diarrhea, Hematochezia Providers:                Norvel Richards, MD, Charlsie Quest. Theda Sers RN, RN,                            Aram Candela Referring MD:              Medicines:                Propofol per Anesthesia Complications:            No immediate complications. Estimated Blood Loss:     Estimated blood loss was minimal. Procedure:                Pre-Anesthesia Assessment:                           - Prior to the procedure, a History and Physical                            was performed, and patient medications and                            allergies were reviewed. The patient's tolerance of                            previous anesthesia was also reviewed. The risks                            and benefits of the procedure and the sedation                            options and risks were discussed with the patient.                            All questions were answered, and informed consent                            was obtained. Prior Anticoagulants: The patient has                            taken no previous anticoagulant or antiplatelet                            agents. ASA Grade Assessment: II - A patient with                            mild systemic disease. After reviewing the risks  and benefits, the patient was deemed in                            satisfactory condition to undergo the procedure.                           After obtaining informed consent, the colonoscope                            was passed under direct vision. Throughout the                            procedure, the patient's blood pressure, pulse, and                            oxygen  saturations were monitored continuously. The                            [id number] scope was introduced through the                            anus and advanced to the 10 cm into the ileum. The                            colonoscopy was performed without difficulty. The                            patient tolerated the procedure well. The quality                            of the bowel preparation was adequate. Scope In: 1:03:42 PM Scope Out: 1:24:32 PM Scope Withdrawal Time: 0 hours 17 minutes 19 seconds  Total Procedure Duration: 0 hours 20 minutes 50 seconds  Findings:      The perianal and digital rectal examinations were normal.      Three semi-pedunculated polyps were found in the descending colon and       cecum. The polyps were 4 to 6 mm in size. These polyps were removed with       a cold snare. Resection and retrieval were complete. Estimated blood       loss was minimal.      Distal 10 cm of terminal ileum appeared normal. The remainder of the       colon appeared normal. Segmental biopsies right and left colon taken to       rule out the possibility of microscopic colitis playing a role here      Patient had significantly engorged internal hemorrhoids with 1 area of       mucosa just distal to the anal verge had a large overlying verrucous       appearance. This area was biopsied. There was minimal bleeding. Impression:               - Three 4 to 6 mm polyps in the descending colon  and in the cecum, removed with a cold snare.                            Resected and retrieved. Abnormal anorectum on                            retroflexion as described. Significant hemorrhoids.                            Focal area of abnormalitylikely not clinically                            significant status post biopsy                           -Status post segmental biopsy of the colon.                            Normal-appearing terminal ileum                            -Patient's bleeding likely emanating from                            hemorrhoids. May be a very good hemorrhoid banding                            candidate. Viberzi 75 mg twice daily has improved                            diarrhea to a significant degree but she still has                            it. Your biopsies are negative for microscopic                            colitis and I would recommend Viberzi be increased                            to 100 mg twice daily. Pamphlet on hemorrhoid                            banding provided to the patient today. Office visit                            with [place] in 8 weeks. Follow-up on pathology. Moderate Sedation:      Moderate (conscious) sedation was personally administered by an       anesthesia professional. The following parameters were monitored: oxygen       saturation, heart rate, blood pressure, respiratory rate, EKG, adequacy       of pulmonary ventilation, and response to care. Recommendation:            Procedure Code(s):        --- Professional ---  45385, Colonoscopy, flexible; with removal of                            tumor(s), polyp(s), or other lesion(s) by snare                            technique Diagnosis Code(s):        --- Professional ---                           K63.5, Polyp of colon                           K52.9, Noninfective gastroenteritis and colitis,                            unspecified                           K92.1, Melena (includes Hematochezia) CPT copyright 2019 American Medical Association. All rights reserved. The codes documented in this report are preliminary and upon coder review may  be revised to meet current compliance requirements. Cristopher Estimable. Nirel Babler, MD Norvel Richards, MD 05/31/2021 1:40:12 PM This report has been signed electronically. Number of Addenda: 0

## 2021-05-31 NOTE — Anesthesia Preprocedure Evaluation (Signed)
Anesthesia Evaluation  Patient identified by MRN, date of birth, ID band Patient awake    Reviewed: Allergy & Precautions, H&P , NPO status , Patient's Chart, lab work & pertinent test results, reviewed documented beta blocker date and time   Airway Mallampati: II  TM Distance: >3 FB Neck ROM: full    Dental no notable dental hx.    Pulmonary asthma , COPD, Current Smoker,    Pulmonary exam normal breath sounds clear to auscultation       Cardiovascular Exercise Tolerance: Good negative cardio ROS   Rhythm:regular Rate:Normal     Neuro/Psych PSYCHIATRIC DISORDERS Anxiety  Neuromuscular disease    GI/Hepatic Neg liver ROS, GERD  Medicated,  Endo/Other  negative endocrine ROS  Renal/GU negative Renal ROS  negative genitourinary   Musculoskeletal   Abdominal   Peds  Hematology negative hematology ROS (+)   Anesthesia Other Findings   Reproductive/Obstetrics negative OB ROS                             Anesthesia Physical Anesthesia Plan  ASA: 2  Anesthesia Plan: General   Post-op Pain Management:    Induction:   PONV Risk Score and Plan: Propofol infusion  Airway Management Planned:   Additional Equipment:   Intra-op Plan:   Post-operative Plan:   Informed Consent: I have reviewed the patients History and Physical, chart, labs and discussed the procedure including the risks, benefits and alternatives for the proposed anesthesia with the patient or authorized representative who has indicated his/her understanding and acceptance.     Dental Advisory Given  Plan Discussed with: CRNA  Anesthesia Plan Comments:         Anesthesia Quick Evaluation

## 2021-05-31 NOTE — Transfer of Care (Signed)
Immediate Anesthesia Transfer of Care Note  Patient: Theresa Pace  Procedure(s) Performed: COLONOSCOPY WITH PROPOFOL POLYPECTOMY BIOPSY  Patient Location: Endoscopy Unit  Anesthesia Type:General  Level of Consciousness: awake and patient cooperative  Airway & Oxygen Therapy: Patient Spontanous Breathing  Post-op Assessment: Report given to RN and Post -op Vital signs reviewed and stable  Post vital signs: Reviewed and stable  Last Vitals:  Vitals Value Taken Time  BP    Temp    Pulse    Resp    SpO2      Last Pain:  Vitals:   05/31/21 1300  TempSrc:   PainSc: 0-No pain      Patients Stated Pain Goal: 8 (96/88/64 8472)  Complications: No notable events documented.

## 2021-05-31 NOTE — Discharge Instructions (Signed)
°  Colonoscopy Discharge Instructions  Read the instructions outlined below and refer to this sheet in the next few weeks. These discharge instructions provide you with general information on caring for yourself after you leave the hospital. Your doctor may also give you specific instructions. While your treatment has been planned according to the most current medical practices available, unavoidable complications occasionally occur. If you have any problems or questions after discharge, call Dr. Gala Romney at [phone]. ACTIVITY You may resume your regular activity, but move at a slower pace for the next 24 hours.  Take frequent rest periods for the next 24 hours.  Walking will help get rid of the air and reduce the bloated feeling in your belly (abdomen).  No driving for 24 hours (because of the medicine (anesthesia) used during the test).   Do not sign any important legal documents or operate any machinery for 24 hours (because of the anesthesia used during the test).  NUTRITION Drink plenty of fluids.  You may resume your normal diet as instructed by your doctor.  Begin with a light meal and progress to your normal diet. Heavy or fried foods are harder to digest and may make you feel sick to your stomach (nauseated).  Avoid alcoholic beverages for 24 hours or as instructed.  MEDICATIONS You may resume your normal medications unless your doctor tells you otherwise.  WHAT YOU CAN EXPECT TODAY Some feelings of bloating in the abdomen.  Passage of more gas than usual.  Spotting of blood in your stool or on the toilet paper.  IF YOU HAD POLYPS REMOVED DURING THE COLONOSCOPY: No aspirin products for 7 days or as instructed.  No alcohol for 7 days or as instructed.  Eat a soft diet for the next 24 hours.  FINDING OUT THE RESULTS OF YOUR TEST Not all test results are available during your visit. If your test results are not back during the visit, make an appointment with your caregiver to find out the  results. Do not assume everything is normal if you have not heard from your caregiver or the medical facility. It is important for you to follow up on all of your test results.  SEEK IMMEDIATE MEDICAL ATTENTION IF: You have more than a spotting of blood in your stool.  Your belly is swollen (abdominal distention).  You are nauseated or vomiting.  You have a temperature over 101.  You have abdominal pain or discomfort that is severe or gets worse throughout the day.    You have significant hemorrhoids  3 polyps removed from your colon.  Biopsies also taken  You may benefit from a higher dose of Viberzi but need to check biopsies first  You may also benefit from hemorrhoid banding in the office.  Pamphlet on hemorrhoid banding provided.  Office visit with Roseanne Kaufman in 2 months  Further recommendations to follow pending review of pathology report  Patient request, I called Anaia Frith at [phone] to reach.

## 2021-05-31 NOTE — Interval H&P Note (Signed)
History and Physical Interval Note:  05/31/2021 12:54 PM  [name]  has presented today for surgery, with the diagnosis of rectal bleeding, history of polyps.  The various methods of treatment have been discussed with the patient and family. After consideration of risks, benefits and other options for treatment, the patient has consented to  Procedure(s) with comments: COLONOSCOPY WITH PROPOFOL (N/A) - 2:00pm as a surgical intervention.  The patient's history has been reviewed, patient examined, no change in status, stable for surgery.  I have reviewed the patient's chart and labs.  Questions were answered to the patient's satisfaction.     Theresa Pace  No change.  Exceedingly rare to have a formed stool.  No prior biopsies per patient report.  Diagnostic colonoscopy per plan. The risks, benefits, limitations, alternatives and imponderables have been reviewed with the patient. Questions have been answered. All parties are agreeable.

## 2021-05-31 NOTE — Anesthesia Postprocedure Evaluation (Signed)
Anesthesia Post Note  Patient: Theresa Pace  Procedure(s) Performed: COLONOSCOPY WITH PROPOFOL POLYPECTOMY BIOPSY  Patient location during evaluation: Phase II Anesthesia Type: General Level of consciousness: awake Pain management: pain level controlled Vital Signs Assessment: post-procedure vital signs reviewed and stable Respiratory status: spontaneous breathing and respiratory function stable Cardiovascular status: blood pressure returned to baseline and stable Postop Assessment: no headache and no apparent nausea or vomiting Anesthetic complications: no Comments: Late entry   No notable events documented.   Last Vitals:  Vitals:   05/31/21 1240 05/31/21 1329  BP: 127/78 (!) 103/59  Pulse: 92 78  Resp: (!) 22 17  Temp: 36.6 C 36.5 C  SpO2: 94% 93%    Last Pain:  Vitals:   05/31/21 1335  TempSrc:   PainSc: [place]

## 2021-06-01 LAB — SURGICAL PATHOLOGY

## 2021-06-02 ENCOUNTER — Encounter: Payer: Self-pay | Admitting: Internal Medicine

## 2021-06-03 ENCOUNTER — Encounter (HOSPITAL_COMMUNITY): Payer: Self-pay | Admitting: Internal Medicine

## 2021-09-14 ENCOUNTER — Encounter: Payer: Medicaid Other | Admitting: Gastroenterology

## 2021-09-14 NOTE — Progress Notes (Deleted)
-   Three 4 to 6 mm polyps in the descending colon                            and in the cecum, removed with a cold snare.                            Resected and retrieved. Abnormal anorectum on                            retroflexion as described. Significant hemorrhoids.                            Focal area of abnormality?"likely not clinically                            significant status post biopsy                           -Status post segmental biopsy of the colon.                            Normal-appearing terminal ileum                           -Patient's bleeding likely emanating from                            hemorrhoids. May be a very good hemorrhoid banding                            candidate. Viberzi 75 mg twice daily has improved                            diarrhea to a significant degree but she still has                            it. Your biopsies are negative for microscopic                            colitis and I would recommend Viberzi be increased                            to 100 mg twice daily.   Adenomas. No microscopic colitis.

## 2021-09-16 ENCOUNTER — Encounter: Payer: Medicaid Other | Admitting: Gastroenterology

## 2021-09-23 ENCOUNTER — Encounter: Payer: Self-pay | Admitting: Gastroenterology

## 2021-09-23 ENCOUNTER — Ambulatory Visit: Payer: Medicaid Other | Admitting: Gastroenterology

## 2021-09-23 VITALS — BP 130/90 | HR 96 | Temp 97.8°F | Ht 62.0 in | Wt 172.0 lb

## 2021-09-23 DIAGNOSIS — K580 Irritable bowel syndrome with diarrhea: Secondary | ICD-10-CM | POA: Diagnosis not present

## 2021-09-23 MED ORDER — VIBERZI 100 MG PO TABS: 1.0000 | ORAL_TABLET | Freq: Two times a day (BID) | ORAL | 5 refills | Status: DC

## 2021-09-23 NOTE — Patient Instructions (Signed)
I have given you samples of Viberzi to take twice a day with food. Please call if you have abdominal pain, nausea, vomiting with this. ? ?I have given a prescription as well. ? ?Please complete the blood work. ? ?We will see you back for banding! ? ?I enjoyed seeing you again today! As you know, I value our relationship and want to provide genuine, compassionate, and quality care. I welcome your feedback. If you receive a survey regarding your visit,  I greatly appreciate you taking time to fill this out. See you next time! ? ?Annitta Needs, PhD, ANP-BC ?[facility]  ? ?

## 2021-09-23 NOTE — Progress Notes (Signed)
? ? ? ? ?Gastroenterology Office Note   ? ? ?Primary Care Physician:  [facility]  ?Primary Gastroenterologist: Dr. Gala Romney  ? ? ?Chief Complaint  ? ?Chief Complaint  ?Patient presents with  ? Follow-up  ?  Pt states that her IBS-D has been acting up, tried the Viberzi samples that she was given at her last visit and states they worked in the beginning but not towards the end.   ? ? ? ?History of Present Illness  ? ?Theresa Pace is a 61 y.o. female presenting today in follow-up with a long-standing history of  ?IBS-D. Failed dicyclomine and OTC agents. Viberzi 75 mg po BID worked well initially but lost effect.  ? ?Colonoscopy Jan 2023 with three 4-6 mm polyps, segmental biopsies of colon, normal TI. Tubular adenomas and hyperplastic. Negative colonic biopsies for microscopic colitis. Surveillance in 5 years. ? ?History of malignant melanoma, now with concerning new nodule in right perihilar region for metastatic disease and left adrenal gland. Mammogram with concern for left brass mass. Close follow-up already arranged.  ? ? ?Interested in banding but not today. 2 loose stools a day. Worse with nerves. At first on the 75 mg BID was good. Better formed stools. Then lost efficacy.  ? ? ? ? ?Past Medical History:  ?Diagnosis Date  ? Arthritis   ? Asthma   ? COPD (chronic obstructive pulmonary disease) ([place])   ? Depression   ? patient states she is no longer depressed  ? Fibromyalgia   ? Frequent urination at night   ? GERD (gastroesophageal reflux disease)   ? Hypercholesteremia   ? IBS (irritable bowel syndrome)   ? Tachycardia   ? treated with Cardizem  ? Tendonitis   ? ? ?Past Surgical History:  ?Procedure Laterality Date  ? ABDOMINAL HYSTERECTOMY    ? APPENDECTOMY    ? BIOPSY  05/31/2021  ? Procedure: BIOPSY;  Surgeon: Daneil Dolin, MD;  [facility];  Service: Endoscopy;;  ? COLONOSCOPY  05/2014  ? 2 sessile polyps, 3-7 mm in size (Tubular adenomas) and internal hemorrhoids.  diverticulosis  ? COLONOSCOPY WITH PROPOFOL N/A 05/31/2021  ? three 4-6 mm polyps, segmental biopsies of colon, normal TI. Tubular adenomas and hyperplastic. Negative colonic biopsies for microscopic colitis. Surveillance in 5 years.  ? KNEE ARTHROPLASTY Right 05/23/2017  ? POLYPECTOMY  05/31/2021  ? Procedure: POLYPECTOMY;  Surgeon: Daneil Dolin, MD;  Location: [facility];  Service: Endoscopy;;  ? skin cancer removal from back    ? ? ?Current Outpatient Medications  ?Medication Sig Dispense Refill  ? ADDERALL XR 30 MG 24 hr capsule Take 30 mg by mouth in the morning, at noon, and at bedtime.    ? albuterol (PROVENTIL HFA;VENTOLIN HFA) 108 (90 BASE) MCG/ACT inhaler Inhale 2 puffs into the lungs every 6 (six) hours as needed for wheezing or shortness of breath.     ? aspirin EC 81 MG tablet Take 81 mg by mouth every evening.    ? baclofen (LIORESAL) 10 MG tablet Take 10 mg by mouth 3 (three) times daily.    ? budesonide (PULMICORT) 0.5 MG/2ML nebulizer solution Take 2 mLs (0.5 mg total) by nebulization daily. (Patient taking differently: Take 0.5 mg by nebulization daily as needed (wheezing/shortness of breath).) 50 mL 0  ? diltiazem (CARDIZEM CD) 120 MG 24 hr capsule Take 120 mg by mouth in the morning.    ? Eluxadoline (VIBERZI) 100 MG TABS Take 1 tablet (100  mg total) by mouth 2 (two) times daily with a meal. 60 tablet 5  ? halobetasol (ULTRAVATE) 0.05 % ointment Apply 1 application topically daily as needed (hand irritation).    ? HYDROcodone-acetaminophen (NORCO) 7.5-325 MG tablet Take 1 tablet by mouth in the morning, at noon, and at bedtime.    ? hydrocortisone (ANUSOL-HC) 2.5 % rectal cream Place 1 application rectally 2 (two) times daily. 30 g 1  ? ipratropium-albuterol (DUONEB) 0.5-2.5 (3) MG/3ML SOLN Take 3 mLs by nebulization 4 (four) times daily. (Patient taking differently: Take 3 mLs by nebulization 4 (four) times daily as needed (wheezing/shortness of breath).)    ? oxybutynin (DITROPAN XL) 15  MG 24 hr tablet Take 15 mg by mouth daily as needed (overactive bladder).    ? pantoprazole (PROTONIX) 40 MG tablet Take 1 tablet (40 mg total) by mouth 2 (two) times daily before a meal. 60 tablet 3  ? Polyethyl Glycol-Propyl Glycol (LUBRICANT EYE DROPS) 0.4-0.3 % SOLN Place 1-2 drops into both eyes 3 (three) times daily as needed (dry/irritated eyes.).    ? risperiDONE (RISPERDAL) 2 MG tablet Take 2 mg by mouth 2 (two) times daily.    ? rosuvastatin (CRESTOR) 20 MG tablet Take 20 mg by mouth in the morning.    ? traZODone (DESYREL) 50 MG tablet Take 150 mg by mouth at bedtime.     ? TRELEGY ELLIPTA 100-62.5-25 MCG/INH AEPB Inhale 1 puff into the lungs daily.    ? ?No current facility-administered medications for this visit.  ? ? ?Allergies as of 09/23/2021 - Review Complete 09/23/2021  ?Allergen Reaction Noted  ? Methadone Hives 11/21/2012  ? Aspirin Other (See Comments) 02/21/2012  ? ? ?Family History  ?Problem Relation Age of Onset  ? Lung cancer Mother   ?     deceased at 34  ? Diabetes Brother   ? Colon cancer Neg Hx   ? ? ?Social History  ? ?Socioeconomic History  ? Marital status: Married  ?  Spouse name: Not on file  ? Number of children: Not on file  ? Years of education: Not on file  ? Highest education level: Not on file  ?Occupational History  ? Not on file  ?Tobacco Use  ? Smoking status: Former  ?  Types: Cigarettes  ?  Quit date: 08/21/2021  ?  Years since quitting: 0.0  ? Smokeless tobacco: Never  ?Vaping Use  ? Vaping Use: Never used  ?Substance and Sexual Activity  ? Alcohol use: No  ? Drug use: No  ? Sexual activity: Yes  ?  Birth control/protection: Surgical  ?Other Topics Concern  ? Not on file  ?Social History Narrative  ? Not on file  ? ?Social Determinants of Health  ? ?Financial Resource Strain: Not on file  ?Food Insecurity: Not on file  ?Transportation Needs: Not on file  ?Physical Activity: Not on file  ?Stress: Not on file  ?Social Connections: Not on file  ?Intimate Partner Violence: Not  on file  ? ? ? ?Review of Systems  ? ?Gen: Denies any fever, chills, fatigue, weight loss, lack of appetite.  ?CV: Denies chest pain, heart palpitations, peripheral edema, syncope.  ?Resp: Denies shortness of breath at rest or with exertion. Denies wheezing or cough.  ?GI: see HPI ?GU : Denies urinary burning, urinary frequency, urinary hesitancy ?MS: Denies joint pain, muscle weakness, cramps, or limitation of movement.  ?Derm: Denies rash, itching, dry skin ?Psych: Denies depression, anxiety, memory loss, and confusion ?Heme:  Denies bruising, bleeding, and enlarged lymph nodes. ? ? ?Physical Exam  ? ?BP 130/90 (BP Location: Left Arm, Patient Position: Sitting, Cuff Size: Normal)   Pulse 96   Temp 97.8 ?F (36.6 ?C) (Oral)   Ht 5\' 2"  (1.575 m)   Wt 172 lb (78 kg)   SpO2 96%   BMI 31.46 kg/m?  ?General:   Alert and oriented. Pleasant and cooperative. Well-nourished and well-developed.  ?Head:  Normocephalic and atraumatic. ?Eyes:  Without icterus ?Abdomen:  +BS, soft, non-tender and non-distended. No HSM noted. No guarding or rebound. No masses appreciated.  ?Rectal:  Deferred  ?Msk:  Symmetrical without gross deformities. Normal posture. ?Extremities:  Without edema. ?Neurologic:  Alert and  oriented x4;  grossly normal neurologically. ?Skin:  Intact without significant lesions or rashes. ?Psych:  Alert and cooperative. Normal mood and affect. ? ? ?Assessment  ? ?Theresa Pace is a 61 y.o. female presenting today in follow-up with a long-standing history of  ?IBS-D. Failed dicyclomine and OTC agents. Viberzi 75 mg po BID worked well initially but lost effect.  ? ?In interim from last appt, colonoscopy was negative for microscopic colitis. Surveillance will be due in 5 years due to adenomas.  ? ?We will increase Viberzi to 100 mg po BID. Samples provided. Will also check celiac serologies.  ? ? ? ? ?PLAN  ? ? ?Viberzi 100 mg po BID. Samples and prescription provided ?Celiac serologies ?Follow-up for  banding ? ? ?Annitta Needs, PhD, ANP-BC ?[facility]  ? ? ? ? ? ?

## 2021-10-07 ENCOUNTER — Encounter: Payer: Medicaid Other | Admitting: Gastroenterology

## 2021-10-21 ENCOUNTER — Encounter: Payer: Medicaid Other | Admitting: Gastroenterology

## 2021-11-30 NOTE — Progress Notes (Unsigned)
   [place] Banding Procedure Note:   Theresa Pace is a 61 y.o. female presenting today for consideration of hemorrhoid banding. Last colonoscopy 05/31/2021  Patient with reports of 3 BMs per day, rectal bleeding and rectal pain. Sits on the toilet for a while because she feels that she is not finished. Has history of IBS. She sometimes sits up to 20 minutes to try and defecate. She feels that hemorrhoids protrude during BM, she has to manually reduce hemorrhoids. Feels that protrusion is circumferential.   The patient presents with symptomatic grade III hemorrhoids, unresponsive to maximal medical therapy, requesting rubber band ligation of his/her hemorrhoidal disease. All risks, benefits, and alternative forms of therapy were described and informed consent was obtained.  The decision was made to band the Right Anterior internal hemorrhoid, and the [place] was used to perform band ligation without complication. Digital anorectal examination was then performed to assure proper positioning of the band, and to adjust the banded tissue as required. The patient was discharged home without pain or other issues. Dietary and behavioral recommendations were given and (if necessary prescriptions were given), along with follow-up instructions. The patient will return on 12/28/21 for followup and possible additional banding as required.  No complications were encountered and the patient tolerated the procedure well.    Chelsea L. Alver Sorrow, MSN, APRN, AGNP-C Adult-Gerontology Nurse Practitioner [facility]

## 2021-12-02 ENCOUNTER — Ambulatory Visit (INDEPENDENT_AMBULATORY_CARE_PROVIDER_SITE_OTHER): Payer: Medicaid Other | Admitting: Gastroenterology

## 2021-12-02 ENCOUNTER — Encounter: Payer: Self-pay | Admitting: Gastroenterology

## 2021-12-02 VITALS — BP 125/81 | HR 123 | Temp 99.6°F | Ht 62.0 in | Wt 172.0 lb

## 2021-12-02 DIAGNOSIS — K642 Third degree hemorrhoids: Secondary | ICD-10-CM

## 2021-12-02 NOTE — Procedures (Signed)
  [place] Banding Procedure Note:   Theresa Pace is a 61 y.o. female presenting today for consideration of hemorrhoid banding. Last colonoscopy 05/31/2021  Patient with reports of 3 BMs per day, rectal bleeding and rectal pain. Sits on the toilet for a while because she feels that she is not finished. Has history of IBS. She sometimes sits up to 20 minutes to try and defecate. She feels that hemorrhoids protrude during BM, she has to manually reduce hemorrhoids. Feels that protrusion is circumferential.   The patient presents with symptomatic grade III hemorrhoids, unresponsive to maximal medical therapy, requesting rubber band ligation of his/her hemorrhoidal disease. All risks, benefits, and alternative forms of therapy were described and informed consent was obtained.  The decision was made to band the Right Anterior internal hemorrhoid, and the [place] was used to perform band ligation without complication. Digital anorectal examination was then performed to assure proper positioning of the band, and to adjust the banded tissue as required. The patient was discharged home without pain or other issues. Dietary and behavioral recommendations were given and (if necessary prescriptions were given), along with follow-up instructions. The patient will return on 12/28/21 for followup and possible additional banding as required.  No complications were encountered and the patient tolerated the procedure well.    Rudra Hobbins L. Alver Sorrow, MSN, APRN, AGNP-C Adult-Gerontology Nurse Practitioner [facility]

## 2021-12-02 NOTE — Patient Instructions (Signed)
You should feel some pressure or the urge to defecate after the hemorrhoid banding procedure, you should not feel pain or pinching. You may resume normal activity immediately.   Please continue to avoid straining.  You should limit your toilet time to 2-3 minutes at the most.   Continue to avoid constipation with increased water intake and high fiber diet.   Please call me with any concerns or issues!  I will see you in follow-up for additional banding in several weeks.

## 2021-12-27 NOTE — Progress Notes (Unsigned)
   [place] Banding Procedure Note:   Theresa Pace is a 61 y.o. female presenting today for consideration of hemorrhoid banding. Last colonoscopy 05/31/21.  Has been doing well up until last night where she began experiencing some pain/discomfort with sitting down. She has noticed some slight bleeding that remains and had some last night. Denies constipation, reports looser stools. She had some external perineal discomfort on exam as well.   The patient presents with symptomatic grade III hemorrhoids, unresponsive to maximal medical therapy, requesting rubber band ligation of his/her hemorrhoidal disease. All risks, benefits, and alternative forms of therapy were described and informed consent was obtained.  The decision was made to band the right posterior internal hemorrhoid, and the [place] was used to perform band ligation without complication. Digital anorectal examination was then performed to assure proper positioning of the band, and to adjust the banded tissue as required. The patient was discharged home without pain or other issues. Dietary and behavioral recommendations were given and (if necessary prescriptions were given), along with follow-up instructions. The patient will return in 2 weeks for followup and possible additional banding as required.  No complications were encountered and the patient tolerated the procedure well.    Venetia Night, MSN, FNP-BC, AGACNP-BC [facility]

## 2021-12-28 ENCOUNTER — Encounter: Payer: Self-pay | Admitting: Gastroenterology

## 2021-12-28 ENCOUNTER — Ambulatory Visit (INDEPENDENT_AMBULATORY_CARE_PROVIDER_SITE_OTHER): Payer: Medicaid Other | Admitting: Gastroenterology

## 2021-12-28 VITALS — BP 115/75 | HR 86 | Temp 99.3°F | Ht 62.0 in | Wt 166.5 lb

## 2021-12-28 DIAGNOSIS — K642 Third degree hemorrhoids: Secondary | ICD-10-CM | POA: Diagnosis not present

## 2021-12-28 NOTE — Patient Instructions (Signed)
I want you to begin applying a zinc paste to your perineum to help with irritation. You can apply this once or twice daily to heal the area.   Continue to avoid straining.   Limit toilet time to 2-3 minutes at the most.   Avoid constipation. Take 2 tablespoons of natural wheat bran, natural oat bran, flax, Benefiber or any over the counter fiber supplement and increase your water intake to 7-8 glasses daily.  Occasionally, you may have more bleeding than usual after the banding procedure. This is often from the untreated hemorrhoids rather than the treated one. Don't be concerned if there is a tablespoon or so of blood. If there is more blood than this, lie flat with your bottom higher than your head and apply an ice pack to the area. If the bleeding does not stop within a half an hour or if you feel faint, have severe pain, chills, fever or difficulty passing urine (very rare) or other problems, you should call us at [phone] or report to the nearest emergency room.call our office at [phone] or go to the emergency room.  Please call me with any concerns!  The procedure you have had should have been relatively painless since the banding of the area involved does not have nerve endings and there is no pain sensation. The rubber band cuts off the blood supply to the hemorrhoid and the band may fall off as soon as 48 hours after the banding (the band may occasionally be seen in the toilet bowl following a bowel movement). You may notice a temporary feeling of fullness in the rectum which should respond adequately to plain Tylenol or Motrin.  I will see you back in follow-up and/or for additional banding.   Venetia Night, MSN, FNP-BC, AGACNP-BC [facility]

## 2022-01-18 ENCOUNTER — Encounter: Payer: Medicaid Other | Admitting: Gastroenterology

## 2022-01-20 ENCOUNTER — Encounter: Payer: Medicaid Other | Admitting: Gastroenterology

## 2022-02-03 ENCOUNTER — Ambulatory Visit (INDEPENDENT_AMBULATORY_CARE_PROVIDER_SITE_OTHER): Payer: Medicaid Other | Admitting: Gastroenterology

## 2022-02-03 ENCOUNTER — Encounter: Payer: Self-pay | Admitting: Gastroenterology

## 2022-02-03 VITALS — BP 128/82 | HR 79 | Temp 98.4°F | Ht 62.0 in | Wt 171.0 lb

## 2022-02-03 DIAGNOSIS — K642 Third degree hemorrhoids: Secondary | ICD-10-CM | POA: Diagnosis not present

## 2022-02-03 MED ORDER — VIBERZI 100 MG PO TABS: 1.0000 | ORAL_TABLET | Freq: Two times a day (BID) | ORAL | 5 refills | Status: DC

## 2022-02-03 NOTE — Progress Notes (Signed)
       [place] BANDING PROCEDURE NOTE  Theresa Pace is a 61 y.o. female presenting today for consideration of hemorrhoid banding. Last colonoscopy January 2023. She has had Right anterior, Right posterior. Still with bleeding and prolapse.    The patient presents with symptomatic grade 3 hemorrhoids, unresponsive to maximal medical therapy, requesting rubber band ligation of her hemorrhoidal disease. All risks, benefits, and alternative forms of therapy were described and informed consent was obtained.  The decision was made to band the left lateral internal hemorrhoid, and the [place] was used to perform band ligation without complication. Digital anorectal examination was then performed to assure proper positioning of the band, and to adjust the banded tissue as required. The patient was discharged home without pain or other issues. Dietary and behavioral recommendations were given, along with follow-up instructions. The patient will return in several weeks for followup and possible additional banding as required. I suspect she may need neutral banding. Will do anoscopy at next visit.   No complications were encountered and the patient tolerated the procedure well.   Annitta Needs, PhD, ANP-BC [facility]

## 2022-02-03 NOTE — Patient Instructions (Signed)
  Please avoid straining.  You should limit your toilet time to 2-3 minutes at the most.   I recommend Benefiber 2 teaspoons each morning in the beverage of your choice!  Please call me with any concerns or issues!  I will see you in follow-up for additional banding in several weeks.  I enjoyed seeing you again today! As you know, I value our relationship and want to provide genuine, compassionate, and quality care. I welcome your feedback. If you receive a survey regarding your visit,  I greatly appreciate you taking time to fill this out. See you next time!  Annitta Needs, PhD, ANP-BC [facility]

## 2022-02-24 ENCOUNTER — Encounter: Payer: Medicaid Other | Admitting: Gastroenterology

## 2022-03-13 ENCOUNTER — Ambulatory Visit
Admission: EM | Admit: 2022-03-13 | Discharge: 2022-03-13 | Disposition: A | Discharge status: Home / Self Care | Payer: Medicaid Other | Attending: Family Medicine | Admitting: Family Medicine

## 2022-03-13 ENCOUNTER — Encounter: Payer: Self-pay | Admitting: Emergency Medicine

## 2022-03-13 ENCOUNTER — Other Ambulatory Visit: Payer: Self-pay

## 2022-03-13 DIAGNOSIS — S29019A Strain of muscle and tendon of unspecified wall of thorax, initial encounter: Secondary | ICD-10-CM

## 2022-03-13 MED ORDER — NAPROXEN 375 MG PO TABS: 375.0000 mg | ORAL_TABLET | Freq: Two times a day (BID) | ORAL | 0 refills | Status: DC | PRN

## 2022-03-13 MED ORDER — CYCLOBENZAPRINE HCL 5 MG PO TABS: 5.0000 mg | ORAL_TABLET | Freq: Three times a day (TID) | ORAL | 0 refills | Status: DC | PRN

## 2022-03-13 NOTE — ED Triage Notes (Signed)
Pt reports left flank pain that is worse with movement since Thursday. Denies any urinary frequency, dysuria, abd pain, known injury. Pt reports started therapy last week for knee and reports "not sure if I pulled something there."

## 2022-03-13 NOTE — ED Provider Notes (Signed)
[facility]    CSN: [id number] Arrival date & time: 03/13/22  0909      History   Chief Complaint Chief Complaint  Patient presents with   Flank Pain    HPI Theresa Pace is a 61 y.o. female.   Patient presenting today with 4-day history of left lateral mid back pain worse with movement, coughing, deep breathing.  Denies any known injuries to the area, heavy lifting or other strenuous activities.  No radiation on the legs or arms, numbness, tingling, weakness, chest pain, shortness of breath, abdominal pain, nausea vomiting or diarrhea, urinary symptoms.  So far not trying anything over-the-counter for symptoms.    Past Medical History:  Diagnosis Date   Arthritis    Asthma    COPD (chronic obstructive pulmonary disease) ([place])    Depression    patient states she is no longer depressed   Fibromyalgia    Frequent urination at night    GERD (gastroesophageal reflux disease)    Hypercholesteremia    IBS (irritable bowel syndrome)    Tachycardia    treated with Cardizem   Tendonitis     Patient Active Problem List   Diagnosis Date Noted   Grade III hemorrhoids 02/03/2022   Rectal bleeding 05/11/2021   Altered mental state 12/15/2020   Myoclonic jerking 12/15/2020   Polycythemia 12/15/2020   Leukocytosis 01/29/2020   Hyperplastic rectal polyp 10/15/2019   Closed fracture of right ankle 12/25/2018 03/21/2019   Primary osteoarthritis of left knee 29/93/7169   Acute metabolic encephalopathy 67/89/3810   Altered mental status    Seizure-like activity (HCC)    Hypokalemia    Elevated glucose level    Prolonged Q-T interval on ECG    IBS (irritable bowel syndrome) 06/06/2018   Hx of adenomatous colonic polyps 06/06/2018   History of total left knee replacement 06/28/2017   Primary localized osteoarthrosis, lower leg 02/20/2017   Acute respiratory distress 07/07/2016   Asthma exacerbation 07/07/2016   Sinus tachycardia 10/22/2015   Acute exacerbation of  chronic obstructive pulmonary disease (COPD) ([place]) 10/21/2015   Anxiety 10/21/2015   GERD (gastroesophageal reflux disease) 10/21/2015   Chronic pain 10/21/2015   Hyperlipidemia 10/21/2015   Acute respiratory failure with hypoxia ([place]) 02/18/2015   COPD (chronic obstructive pulmonary disease) ([place]) 02/16/2015   COPD exacerbation ([place]) 02/16/2015   Mycoplasma pneumonia 02/16/2015   Right knee pain 03/13/2013   High risk medication use 02/14/2013   Spondylolisthesis of lumbosacral region 02/14/2013   Chronic pain syndrome 02/14/2013   Seizure ([place]) 02/22/2012   Bleeding hemorrhoid 02/22/2012   Grade I internal hemorrhoids 02/22/2012   Acute encephalopathy 02/21/2012   Asthma 02/21/2012   Depression 02/21/2012    Past Surgical History:  Procedure Laterality Date   ABDOMINAL HYSTERECTOMY     APPENDECTOMY     BIOPSY  05/31/2021   Procedure: BIOPSY;  Surgeon: Daneil Dolin, MD;  [facility];  Service: Endoscopy;;   COLONOSCOPY  05/2014   2 sessile polyps, 3-7 mm in size (Tubular adenomas) and internal hemorrhoids. diverticulosis   COLONOSCOPY WITH PROPOFOL N/A 05/31/2021   three 4-6 mm polyps, segmental biopsies of colon, normal TI. Tubular adenomas and hyperplastic. Negative colonic biopsies for microscopic colitis. Surveillance in 5 years.   KNEE ARTHROPLASTY Right 05/23/2017   POLYPECTOMY  05/31/2021   Procedure: POLYPECTOMY;  Surgeon: Daneil Dolin, MD;  Location: [facility];  Service: Endoscopy;;   skin cancer removal from back  OB History   No obstetric history on file.      Home Medications    Prior to Admission medications   Medication Sig Start Date End Date Taking? Authorizing Provider  cyclobenzaprine (FLEXERIL) 5 MG tablet Take 1 tablet (5 mg total) by mouth 3 (three) times daily as needed for muscle spasms. Do not drink alcohol or drive while taking this medication.  May cause drowsiness. 03/13/22  Yes Volney American, PA-C   naproxen (NAPROSYN) 375 MG tablet Take 1 tablet (375 mg total) by mouth 2 (two) times daily as needed. 03/13/22  Yes Volney American, PA-C  ADDERALL XR 30 MG 24 hr capsule Take 30 mg by mouth in the morning, at noon, and at bedtime. 04/22/21   [provider]  albuterol (PROVENTIL HFA;VENTOLIN HFA) 108 (90 BASE) MCG/ACT inhaler Inhale 2 puffs into the lungs every 6 (six) hours as needed for wheezing or shortness of breath.     [provider]  ARIPiprazole (ABILIFY) 10 MG tablet Take 10 mg by mouth daily. 01/21/22   [provider]  aspirin EC 81 MG tablet Take 81 mg by mouth every evening.    [provider]  baclofen (LIORESAL) 10 MG tablet Take 10 mg by mouth 3 (three) times daily.    [provider]  budesonide (PULMICORT) 0.5 MG/2ML nebulizer solution Take 2 mLs (0.5 mg total) by nebulization daily. Patient taking differently: Take 0.5 mg by nebulization daily as needed (wheezing/shortness of breath). 06/27/16   Isla Pence, MD  diltiazem (CARDIZEM CD) 120 MG 24 hr capsule Take 120 mg by mouth in the morning. 04/22/21   [provider]  Eluxadoline (VIBERZI) 100 MG TABS Take 1 tablet (100 mg total) by mouth 2 (two) times daily with a meal. 02/03/22   Annitta Needs, NP  halobetasol (ULTRAVATE) 0.05 % ointment Apply 1 application topically daily as needed (hand irritation). 11/26/20   [provider]  HYDROcodone-acetaminophen (NORCO) 7.5-325 MG tablet Take 1 tablet by mouth in the morning, at noon, and at bedtime.    [provider]  hydrocortisone (ANUSOL-HC) 2.5 % rectal cream Place 1 application rectally 2 (two) times daily. 05/11/21   Annitta Needs, NP  ipratropium-albuterol (DUONEB) 0.5-2.5 (3) MG/3ML SOLN Take 3 mLs by nebulization 4 (four) times daily. Patient taking differently: Take 3 mLs by nebulization 4 (four) times daily as needed (wheezing/shortness of breath). 02/20/15   Samuella Cota, MD  oxybutynin  (DITROPAN XL) 15 MG 24 hr tablet Take 15 mg by mouth daily as needed (overactive bladder). 11/03/20   [provider]  pantoprazole (PROTONIX) 40 MG tablet Take 1 tablet (40 mg total) by mouth 2 (two) times daily before a meal. 07/11/16   Orvan Falconer, MD  Polyethyl Glycol-Propyl Glycol (LUBRICANT EYE DROPS) 0.4-0.3 % SOLN Place 1-2 drops into both eyes 3 (three) times daily as needed (dry/irritated eyes.).    [provider]  rosuvastatin (CRESTOR) 20 MG tablet Take 20 mg by mouth in the morning.    [provider]  traZODone (DESYREL) 50 MG tablet Take 150 mg by mouth at bedtime.     [provider]  TRELEGY ELLIPTA 100-62.5-25 MCG/INH AEPB Inhale 1 puff into the lungs daily. 11/26/20   [provider]    Family History Family History  Problem Relation Age of Onset   Lung cancer Mother        deceased at 2   Diabetes Brother    Colon cancer  Neg Hx     Social History Social History   Tobacco Use   Smoking status: Former    Types: Cigarettes    Quit date: 08/21/2021    Years since quitting: 0.5    Passive exposure: Past   Smokeless tobacco: Never  Vaping Use   Vaping Use: Never used  Substance Use Topics   Alcohol use: No   Drug use: No     Allergies   Methadone and Aspirin   Review of Systems Review of Systems Per HPI  Physical Exam Triage Vital Signs ED Triage Vitals  Enc Vitals Group     BP 03/13/22 0917 129/85     Pulse Rate 03/13/22 0917 93     Resp 03/13/22 0917 20     Temp 03/13/22 0917 97.6 F (36.4 C)     Temp Source 03/13/22 0917 Oral     SpO2 03/13/22 0917 95 %     Weight --      Height --      Head Circumference --      Peak Flow --      Pain Score 03/13/22 0918 10     Pain Loc --      Pain Edu? --      Excl. in [place]? --    No data found.  Updated Vital Signs BP 129/85 (BP Location: Right Arm)   Pulse 93   Temp 97.6 F (36.4 C) (Oral)   Resp 20   SpO2 95%   Visual Acuity Right Eye Distance:    Left Eye Distance:   Bilateral Distance:    Right Eye Near:   Left Eye Near:    Bilateral Near:     Physical Exam Vitals and nursing note reviewed.  Constitutional:      Appearance: Normal appearance. She is not ill-appearing.  HENT:     Head: Atraumatic.     Mouth/Throat:     Mouth: Mucous membranes are moist.  Eyes:     Extraocular Movements: Extraocular movements intact.     Conjunctiva/sclera: Conjunctivae normal.  Cardiovascular:     Rate and Rhythm: Normal rate and regular rhythm.     Heart sounds: Normal heart sounds.  Pulmonary:     Effort: Pulmonary effort is normal.     Breath sounds: Normal breath sounds. No wheezing or rales.  Abdominal:     General: Bowel sounds are normal. There is no distension.     Palpations: Abdomen is soft.     Tenderness: There is no abdominal tenderness. There is no guarding.  Musculoskeletal:        General: Tenderness present. No swelling. Normal range of motion.     Cervical back: Normal range of motion and neck supple.     Comments: Left lateral thoracic musculature tender to palpation.  No midline spinal tenderness to palpation diffusely.  Negative straight leg raise, normal gait and range of motion diffusely  Skin:    General: Skin is warm and dry.  Neurological:     Mental Status: She is alert and oriented to person, place, and time.     Comments: All 4 extremities neurovascularly intact  Psychiatric:        Mood and Affect: Mood normal.        Thought Content: Thought content normal.        Judgment: Judgment normal.      UC Treatments / Results  Labs (all labs ordered are listed, but only abnormal results are displayed) Labs  Reviewed - No data to display  EKG   Radiology No results found.  Procedures Procedures (including critical care time)  Medications Ordered in UC Medications - No data to display  Initial Impression / Assessment and Plan / UC Course  I have reviewed the triage vital signs and the  nursing notes.  Pertinent labs & imaging results that were available during my care of the patient were reviewed by me and considered in my medical decision making (see chart for details).     Consistent with muscular strain, treat with Flexeril, naproxen, heat, massage, stretches.  Return for worsening symptoms.  Final Clinical Impressions(s) / UC Diagnoses   Final diagnoses:  Thoracic myofascial strain, initial encounter   Discharge Instructions   None    ED Prescriptions     Medication Sig Dispense Auth. Provider   cyclobenzaprine (FLEXERIL) 5 MG tablet Take 1 tablet (5 mg total) by mouth 3 (three) times daily as needed for muscle spasms. Do not drink alcohol or drive while taking this medication.  May cause drowsiness. 15 tablet Volney American, [place]   naproxen (NAPROSYN) 375 MG tablet Take 1 tablet (375 mg total) by mouth 2 (two) times daily as needed. 20 tablet Volney American, [place]      PDMP not reviewed this encounter.   Merrie Roof Jones, [place] 03/13/22 [phone]

## 2022-04-11 ENCOUNTER — Encounter: Payer: Self-pay | Admitting: Internal Medicine

## 2022-04-11 ENCOUNTER — Ambulatory Visit (INDEPENDENT_AMBULATORY_CARE_PROVIDER_SITE_OTHER): Payer: Medicaid Other | Admitting: Internal Medicine

## 2022-04-11 VITALS — BP 140/86 | HR 104 | Temp 98.2°F | Ht 62.0 in | Wt 168.2 lb

## 2022-04-11 DIAGNOSIS — F17210 Nicotine dependence, cigarettes, uncomplicated: Secondary | ICD-10-CM | POA: Insufficient documentation

## 2022-04-11 DIAGNOSIS — J449 Chronic obstructive pulmonary disease, unspecified: Secondary | ICD-10-CM

## 2022-04-11 MED ORDER — BREZTRI AEROSPHERE 160-9-4.8 MCG/ACT IN AERO: INHALATION_SPRAY | RESPIRATORY_TRACT | 11 refills | Status: DC

## 2022-04-11 MED ORDER — PREDNISONE 10 MG PO TABS: ORAL_TABLET | ORAL | 0 refills | Status: DC

## 2022-04-11 MED ORDER — AMOXICILLIN-POT CLAVULANATE 875-125 MG PO TABS: 1.0000 | ORAL_TABLET | Freq: Two times a day (BID) | ORAL | 0 refills | Status: DC

## 2022-04-11 NOTE — Assessment & Plan Note (Addendum)
Counseled re importance of smoking cessation but did not meet time criteria for separate billing       Each maintenance medication was reviewed in detail including emphasizing most importantly the difference between maintenance and prns and under what circumstances the prns are to be triggered using an action plan format where appropriate.  Total time for H and P, chart review, counseling, reviewing hfa/neb device(s) and generating customized AVS unique to this office visit / same day charting > 60 min new pt eval

## 2022-04-11 NOTE — Assessment & Plan Note (Addendum)
Active smoker - 04/11/2022  After extensive coaching inhaler device,  effectiveness =    75% from a baseline of < 25%  so try breztri 2bid   Having a mild aecopd presently and recurrently on trelegy  > rec augmentin and pred x 6 days and try a new maint rx(DPI's tend to cause more cough than hfa/ which is one of her concerns)    Group D (now reclassified as E) in terms of symptom/risk and laba/lama/ICS  therefore appropriate rx at this point >>>  breztri and approp saba   Re SABA :  I spent extra time with pt today reviewing appropriate use of albuterol for prn use on exertion with the following points: 1) saba is for relief of sob that does not improve by walking a slower pace or resting but rather if the pt does not improve after trying this first. 2) If the pt is convinced, as many are, that saba helps recover from activity faster then it's easy to tell if this is the case by re-challenging : ie stop, take the inhaler, then p 5 minutes try the exact same activity (intensity of workload) that just caused the symptoms and see if they are substantially diminished or not after saba 3) if there is an activity that reproducibly causes the symptoms, try the saba 15 min before the activity on alternate days   If in fact the saba really does help, then fine to continue to use it prn but advised may need to look closer at the maintenance regimen being used to achieve better control of airways disease with exertion.

## 2022-04-11 NOTE — Progress Notes (Unsigned)
Theresa Pace, female    DOB: [birth year]    MRN: [id number]   Brief patient profile:  61   yowf  started smoking age 61 with onset asthma during IUP x 4 then more of a chronic problem with doe and aecopd   after that [place] admit 2012 and none since  referred to pulmonary clinic in [facility]  04/11/2022 by Quillian Quince  for copd eval with last pft in [place] pt does not know results or when done    History of Present Illness  04/11/2022  Pulmonary/ 1st office eval/ [name] / [facility] / trelegy  Chief Complaint  Patient presents with   Consult    Ref by Dr. Olena Heckle for COPD- SOB and cough   Dyspnea:  no problem at grocery store, [employer] wouid be pushing it, steps are a problem walks about same speed  Cough: more than usual x 6 m, esp in am  slt greenish better p augmentin  No assoc sincus  Sleep: bed is flat/ 2 pillows  SABA use: hfa each am  02: cont  Lung cancer screen: vax max   No obvious day to day or daytime pattern/variability or assoc excess/ purulent sputum or mucus plugs or hemoptysis or cp or chest tightness, subjective wheeze or overt sinus or hb symptoms.   *** without nocturnal  or early am exacerbation  of respiratory  c/o's or need for noct saba. Also denies any obvious fluctuation of symptoms with weather or environmental changes or other aggravating or alleviating factors except as outlined above   No unusual exposure hx or h/o childhood pna/ asthma or knowledge of premature birth.  Current Allergies, Complete Past Medical History, Past Surgical History, Family History, and Social History were reviewed in [employer] record.  ROS  The following are not active complaints unless bolded Hoarseness, sore throat, dysphagia, dental problems, itching, sneezing,  nasal congestion or discharge of excess mucus or purulent secretions, ear ache,   fever, chills, sweats, unintended wt loss or wt gain, classically pleuritic or exertional cp,  orthopnea pnd  or arm/hand swelling  or leg swelling, presyncope, palpitations, abdominal pain, anorexia, nausea, vomiting, diarrhea  or change in bowel habits or change in bladder habits, change in stools or change in urine, dysuria, hematuria,  rash, arthralgias, visual complaints, headache, numbness, weakness or ataxia or problems with walking or coordination,  change in mood or  memory.            Past Medical History:  Diagnosis Date   Arthritis    Asthma    COPD (chronic obstructive pulmonary disease) ([place])    Depression    patient states she is no longer depressed   Fibromyalgia    Frequent urination at night    GERD (gastroesophageal reflux disease)    Hypercholesteremia    IBS (irritable bowel syndrome)    Tachycardia    treated with Cardizem   Tendonitis     Outpatient Medications Prior to Visit  Medication Sig Dispense Refill   ADDERALL XR 30 MG 24 hr capsule Take 30 mg by mouth in the morning, at noon, and at bedtime.     albuterol (PROVENTIL HFA;VENTOLIN HFA) 108 (90 BASE) MCG/ACT inhaler Inhale 2 puffs into the lungs every 6 (six) hours as needed for wheezing or shortness of breath.      ARIPiprazole (ABILIFY) 10 MG tablet Take 10 mg by mouth daily.     aspirin EC 81 MG tablet Take 81 mg  by mouth every evening.     baclofen (LIORESAL) 10 MG tablet Take 10 mg by mouth 3 (three) times daily.     budesonide (PULMICORT) 0.5 MG/2ML nebulizer solution Take 2 mLs (0.5 mg total) by nebulization daily. (Patient taking differently: Take 0.5 mg by nebulization daily as needed (wheezing/shortness of breath).) 50 mL 0   cyclobenzaprine (FLEXERIL) 5 MG tablet Take 1 tablet (5 mg total) by mouth 3 (three) times daily as needed for muscle spasms. Do not drink alcohol or drive while taking this medication.  May cause drowsiness. 15 tablet 0   diltiazem (CARDIZEM CD) 120 MG 24 hr capsule Take 120 mg by mouth in the morning.     Eluxadoline (VIBERZI) 100 MG TABS Take 1 tablet (100 mg total) by mouth 2  (two) times daily with a meal. 60 tablet 5   halobetasol (ULTRAVATE) 0.05 % ointment Apply 1 application topically daily as needed (hand irritation).     HYDROcodone-acetaminophen (NORCO) 7.5-325 MG tablet Take 1 tablet by mouth in the morning, at noon, and at bedtime.     hydrocortisone (ANUSOL-HC) 2.5 % rectal cream Place 1 application rectally 2 (two) times daily. 30 g 1   ipratropium-albuterol (DUONEB) 0.5-2.5 (3) MG/3ML SOLN Take 3 mLs by nebulization 4 (four) times daily. (Patient taking differently: Take 3 mLs by nebulization 4 (four) times daily as needed (wheezing/shortness of breath).)     naproxen (NAPROSYN) 375 MG tablet Take 1 tablet (375 mg total) by mouth 2 (two) times daily as needed. 20 tablet 0   oxybutynin (DITROPAN XL) 15 MG 24 hr tablet Take 15 mg by mouth daily as needed (overactive bladder).     pantoprazole (PROTONIX) 40 MG tablet Take 1 tablet (40 mg total) by mouth 2 (two) times daily before a meal. 60 tablet 3   Polyethyl Glycol-Propyl Glycol (LUBRICANT EYE DROPS) 0.4-0.3 % SOLN Place 1-2 drops into both eyes 3 (three) times daily as needed (dry/irritated eyes.).     rosuvastatin (CRESTOR) 20 MG tablet Take 20 mg by mouth in the morning.     traZODone (DESYREL) 50 MG tablet Take 150 mg by mouth at bedtime.      TRELEGY ELLIPTA 100-62.5-25 MCG/INH AEPB Inhale 1 puff into the lungs daily.     No facility-administered medications prior to visit.     Objective:     BP (!) 140/86   Pulse (!) 104   Temp 98.2 F (36.8 C)   Ht 5\' 2"  (1.575 m)   Wt 168 lb 3.2 oz (76.3 kg)   SpO2 96% Comment: ra  BMI 30.76 kg/m   SpO2: 96 % (ra)  Amb wf nad/ edentulous   HEENT : Oropharynx  clear  Nasal turbinates nl    NECK :  without  apparent JVD/ palpable Nodes/TM    LUNGS: no acc muscle use,  Min barrel  contour chest wall with bilateral  insp /exp rhonchi  and  without cough on insp or exp maneuvers and min  Hyperresonant  to  percussion bilaterally    CV:  RRR  no s3  or murmur or increase in P2, and no edema   ABD:  soft and nontender with pos end  insp Hoover's  in the supine position.  No bruits or organomegaly appreciated   MS:  Nl gait/ ext warm without deformities Or obvious joint restrictions  calf tenderness, cyanosis or clubbing     SKIN: warm and dry without lesions    NEURO:  alert, approp,  nl sensorium with  no motor or cerebellar deficits apparent.        Cxr was done at [facility] 2023 ok and [facility] Dec each year     Assessment   No problem-specific Assessment & Plan notes found for this encounter.     Christinia Gully, MD 04/11/2022

## 2022-04-11 NOTE — Patient Instructions (Signed)
Plan A = Automatic = Always=   Breztri Take 2 puffs first thing in am and then another 2 puffs about 12 hours later.    Work on inhaler technique:  relax and gently blow all the way out then take a nice smooth full deep breath back in, triggering the inhaler at same time you start breathing in.  Hold breath in for at least  5 seconds if you can. Blow out breztri thru nose. Rinse and gargle with water when done.  If mouth or throat bother you at all,  try brushing teeth/gums/tongue with arm and hammer toothpaste/ make a slurry and gargle and spit out.    Plan B = Backup (to supplement plan A, not to replace it) Only use your albuterol inhaler as a rescue medication to be used if you can't catch your breath by resting or doing a relaxed purse lip breathing pattern.  - The less you use it, the better it will work when you need it. - Ok to use the inhaler up to 2 puffs  every 4 hours if you must but call for appointment if use goes up over your usual need - Don't leave home without it !!  (think of it like the spare tire for your car)   Plan C = Crisis (instead of Plan B but only if Plan B stops working) - only use your albuterol nebulizer if you first try Plan B and it fails to help > ok to use the nebulizer up to every 4 hours but if start needing it regularly call for immediate appointment    Prednisone 10 mg take  4 each am x 2 days,   2 each am x 2 days,  1 each am x 2 days and stop   Augmentin 875 mg take one pill twice daily  X 10 days - take at breakfast and supper with large glass of water.  It would help reduce the usual side effects (diarrhea and yeast infections) if you ate cultured yogurt at lunch.   Please schedule a follow up office visit in 6 weeks, call sooner if needed PFTs next available

## 2022-04-13 ENCOUNTER — Encounter: Payer: Self-pay | Admitting: Internal Medicine

## 2022-04-19 IMAGING — MG MM BREAST BX W LOC DEV 1ST LESION IMAGE BX SPEC STEREO GUIDE*L*
8 of 10 series · 8 of 18 positions shown · non-contrast
Comparison: Previous exams.
COMPARISON: Previous exams.

Addendum:
CLINICAL DATA: 59-year-old female with indeterminate left breast
calcifications.

EXAM:
LEFT BREAST STEREOTACTIC CORE NEEDLE BIOPSY

[L (1 of 7)]
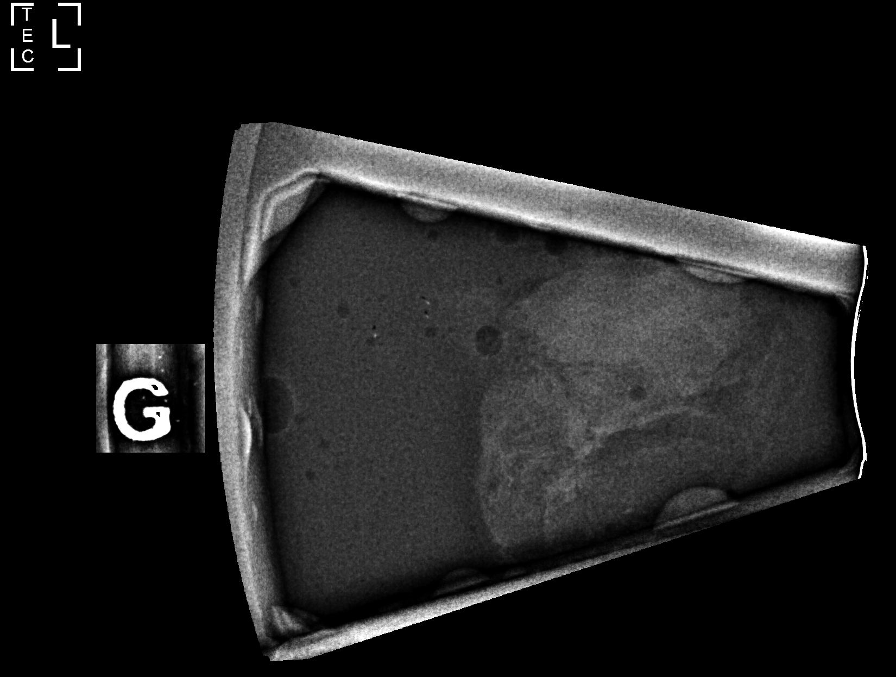

[L (2 of 7)]
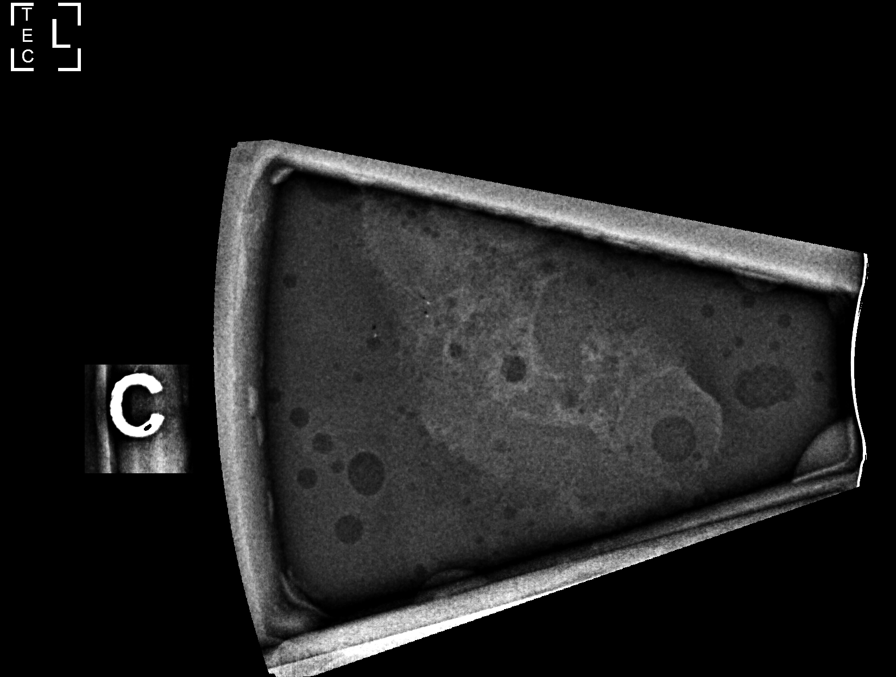

[L (3 of 7)]
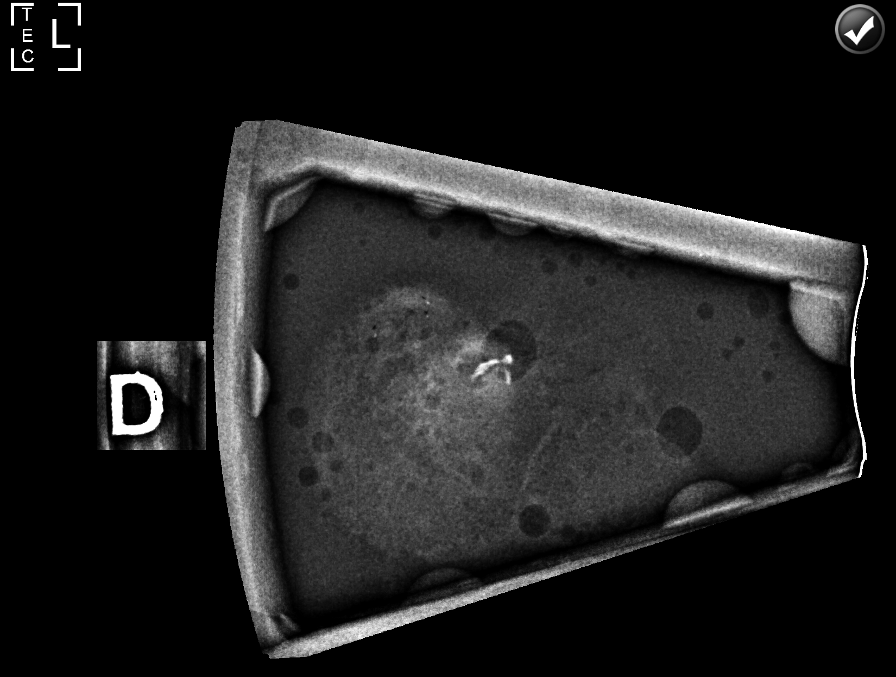

[L (4 of 7)]
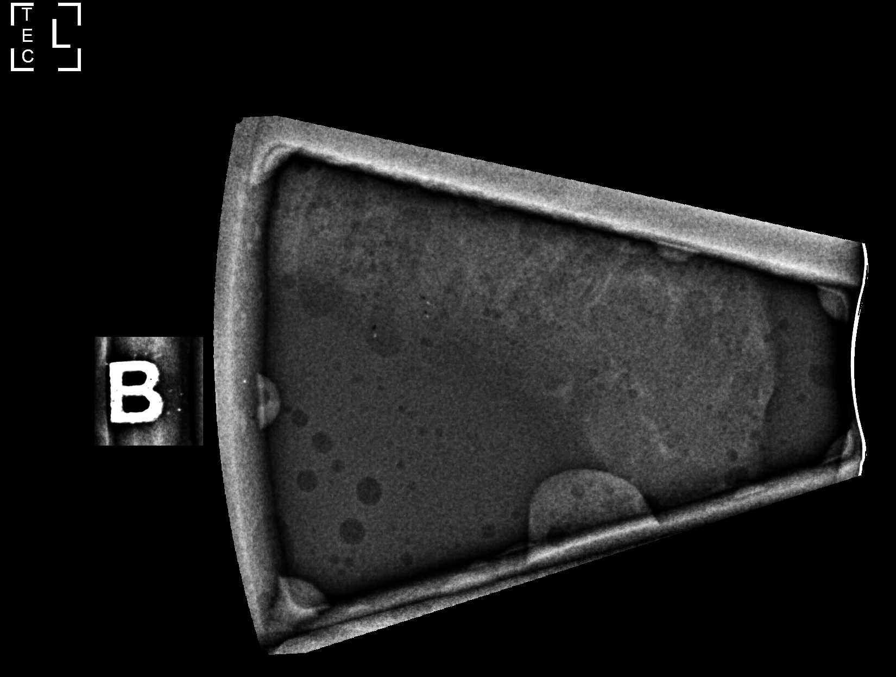

[L (5 of 7)]
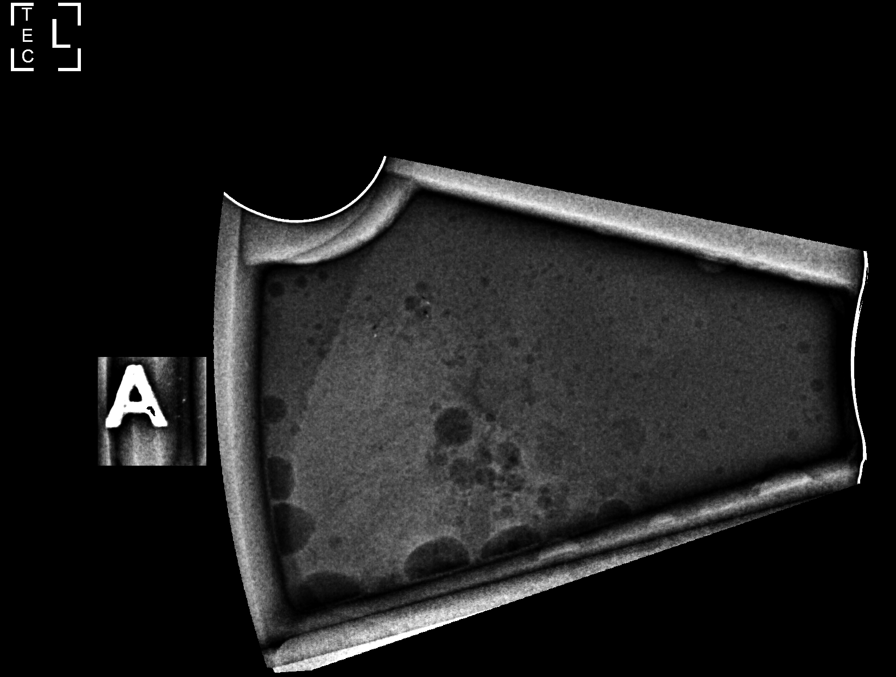

[L (6 of 7)]
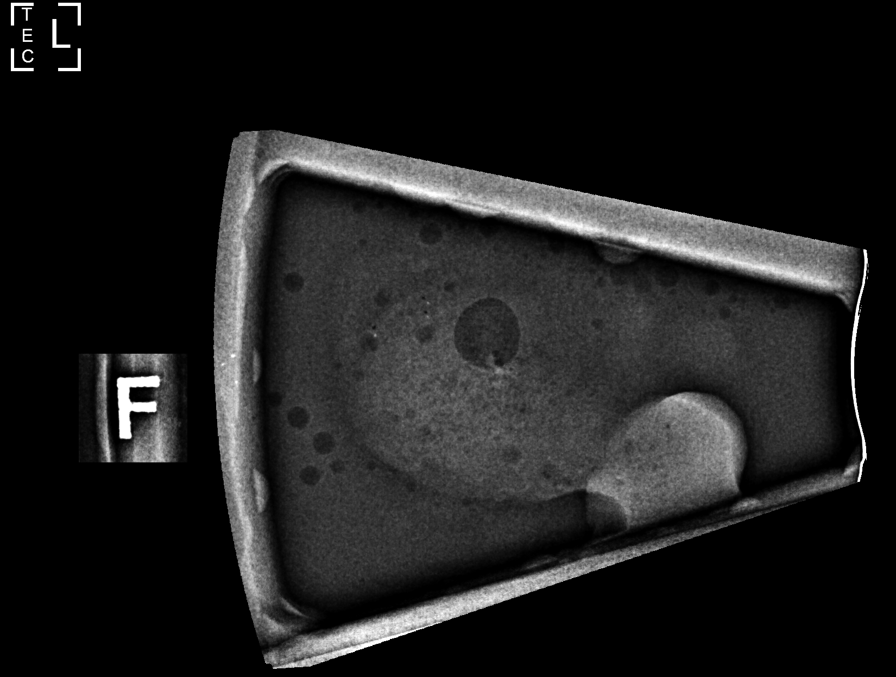

[L (7 of 7)]
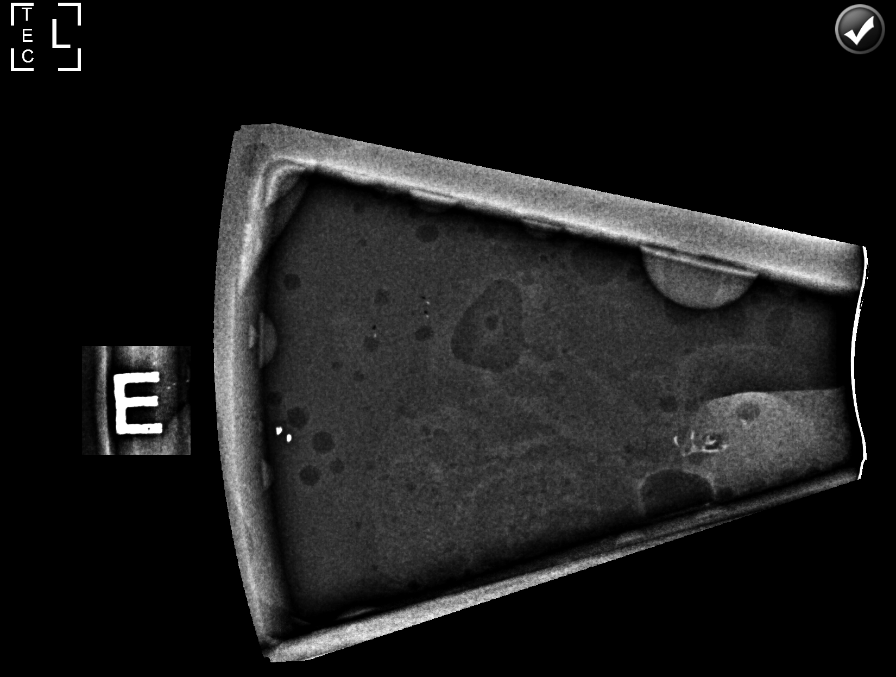

[L CC]
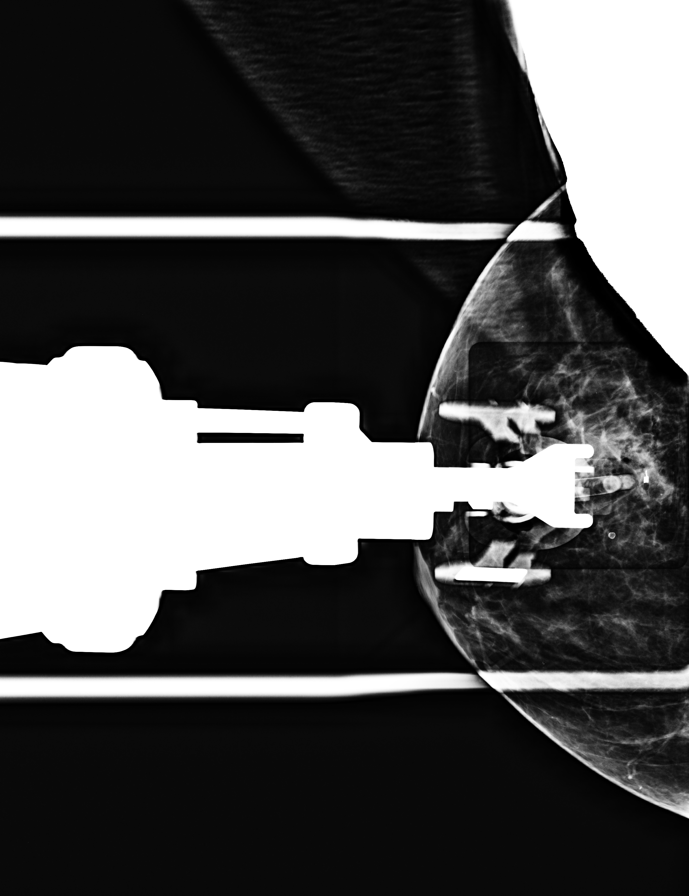

[8 of 18 positions shown; findings below may reference images not displayed]



Using sterile technique and 1% Lidocaine as local anesthetic, under
stereotactic guidance, a 9 gauge vacuum assisted device was used to
perform core needle biopsy of calcifications in the upper inner
quadrant of the left breast using a superior approach. Specimen
radiograph was performed showing calcifications in several
specimens. Specimens with calcifications are identified for
pathology.

Lesion quadrant: Upper inner quadrant

At the conclusion of the procedure, a coil shaped tissue marker clip
was deployed into the biopsy cavity. Follow-up 2-view mammogram was
performed and dictated separately.
IMPRESSION: Stereotactic-guided biopsy of the left breast. No apparent
complications.

ADDENDUM:
Pathology revealed FIBROADENOMATOID CHANGE WITH CALCIFICATIONS of
the Left breast, upper inner quadrant, posterior. This was found to
be concordant by Dr. Frantz Amador.

Pathology results were discussed with the patient by telephone by
Wilson Sena, RN Nurse Navigator. The patient reported doing well
after the biopsy with tenderness at the site. Post biopsy
instructions and care were reviewed and questions were answered. The
patient was encouraged to call [REDACTED] for any additional concerns.

The patient was instructed to return for annual screening
mammography.

Pathology results reported by Malin Ake, RN on 07/09/2020.



Using sterile technique and 1% Lidocaine as local anesthetic, under
stereotactic guidance, a 9 gauge vacuum assisted device was used to
perform core needle biopsy of calcifications in the upper inner
quadrant of the left breast using a superior approach. Specimen
radiograph was performed showing calcifications in several
specimens. Specimens with calcifications are identified for
pathology.

Lesion quadrant: Upper inner quadrant

At the conclusion of the procedure, a coil shaped tissue marker clip
was deployed into the biopsy cavity. Follow-up 2-view mammogram was
performed and dictated separately.
IMPRESSION: Stereotactic-guided biopsy of the left breast. No apparent
complications.

## 2022-05-25 ENCOUNTER — Ambulatory Visit: Payer: Medicaid Other | Admitting: Internal Medicine

## 2022-05-25 ENCOUNTER — Emergency Department (HOSPITAL_COMMUNITY): Payer: Medicaid Other

## 2022-05-25 ENCOUNTER — Encounter (HOSPITAL_COMMUNITY): Payer: Self-pay

## 2022-05-25 ENCOUNTER — Emergency Department (HOSPITAL_COMMUNITY)
Admission: EM | Admit: 2022-05-25 | Discharge: 2022-05-26 | Disposition: A | Discharge status: Home / Self Care | Payer: Medicaid Other | Attending: Emergency Medicine | Admitting: Emergency Medicine

## 2022-05-25 ENCOUNTER — Other Ambulatory Visit: Payer: Self-pay

## 2022-05-25 DIAGNOSIS — R0602 Shortness of breath: Secondary | ICD-10-CM | POA: Diagnosis not present

## 2022-05-25 DIAGNOSIS — Z7982 Long term (current) use of aspirin: Secondary | ICD-10-CM | POA: Insufficient documentation

## 2022-05-25 DIAGNOSIS — E876 Hypokalemia: Secondary | ICD-10-CM

## 2022-05-25 DIAGNOSIS — J449 Chronic obstructive pulmonary disease, unspecified: Secondary | ICD-10-CM | POA: Insufficient documentation

## 2022-05-25 DIAGNOSIS — Z20822 Contact with and (suspected) exposure to covid-19: Secondary | ICD-10-CM | POA: Insufficient documentation

## 2022-05-25 DIAGNOSIS — J441 Chronic obstructive pulmonary disease with (acute) exacerbation: Secondary | ICD-10-CM

## 2022-05-25 HISTORY — DX: Malignant (primary) neoplasm, unspecified: C80.1

## 2022-05-25 LAB — CBC WITH DIFFERENTIAL/PLATELET
Abs Immature Granulocytes: 0.05 10*3/uL (ref 0.00–0.07)
Basophils Absolute: 0 10*3/uL (ref 0.0–0.1)
Basophils Relative: 0 %
Eosinophils Absolute: 0 10*3/uL (ref 0.0–0.5)
Eosinophils Relative: 0 %
HCT: 44.4 % (ref 36.0–46.0)
Hemoglobin: 14.9 g/dL (ref 12.0–15.0)
Immature Granulocytes: 1 %
Lymphocytes Relative: 27 %
Lymphs Abs: 1.8 10*3/uL (ref 0.7–4.0)
MCH: 29.9 pg (ref 26.0–34.0)
MCHC: 33.6 g/dL (ref 30.0–36.0)
MCV: 89 fL (ref 80.0–100.0)
Monocytes Absolute: 0.5 10*3/uL (ref 0.1–1.0)
Monocytes Relative: 8 %
Neutro Abs: 4.4 10*3/uL (ref 1.7–7.7)
Neutrophils Relative %: 64 %
Platelets: 200 10*3/uL (ref 150–400)
RBC: 4.99 MIL/uL (ref 3.87–5.11)
RDW: 13 % (ref 11.5–15.5)
WBC: 6.8 10*3/uL (ref 4.0–10.5)
nRBC: 0 % (ref 0.0–0.2)

## 2022-05-25 MED ORDER — PREDNISONE 50 MG PO TABS
60.0000 mg | ORAL_TABLET | Freq: Once | ORAL | Status: AC
  Administered 2022-05-26: 60 mg via ORAL
  Filled 2022-05-25: qty 1

## 2022-05-25 MED ORDER — IPRATROPIUM-ALBUTEROL 0.5-2.5 (3) MG/3ML IN SOLN
3.0000 mL | Freq: Once | RESPIRATORY_TRACT | Status: AC
  Administered 2022-05-25: 3 mL via RESPIRATORY_TRACT
  Filled 2022-05-25: qty 3

## 2022-05-25 NOTE — ED Provider Notes (Signed)
[facility] EMERGENCY DEPARTMENT Provider Note   CSN: [id number] Arrival date & time: 05/25/22  1903     History  Chief Complaint  Patient presents with   Shortness of Breath    Theresa Pace is a 62 y.o. female.  She history of COPD, states she was diagnosed with the flu last week and has been feeling short of breath and generally unwell since that time.  Denies abdominal pain nausea vomiting or diarrhea.  She does admit to some generalized malaise and weakness.  Denies any numbness or tingling, no chest pain.   Shortness of Breath Associated symptoms: cough and wheezing   Associated symptoms: no fever        Home Medications Prior to Admission medications   Medication Sig Start Date End Date Taking? Authorizing Provider  ADDERALL XR 30 MG 24 hr capsule Take 30 mg by mouth in the morning, at noon, and at bedtime. 04/22/21   [provider]  albuterol (PROVENTIL HFA;VENTOLIN HFA) 108 (90 BASE) MCG/ACT inhaler Inhale 2 puffs into the lungs every 6 (six) hours as needed for wheezing or shortness of breath.     [provider]  amoxicillin-clavulanate (AUGMENTIN) 875-125 MG tablet Take 1 tablet by mouth 2 (two) times daily. 04/11/22   Nyoka Cowden, MD  ARIPiprazole (ABILIFY) 10 MG tablet Take 10 mg by mouth daily. 01/21/22   [provider]  aspirin EC 81 MG tablet Take 81 mg by mouth every evening.    [provider]  baclofen (LIORESAL) 10 MG tablet Take 10 mg by mouth 3 (three) times daily.    [provider]  Budeson-Glycopyrrol-Formoterol (BREZTRI AEROSPHERE) 160-9-4.8 MCG/ACT AERO Take 2 puffs first thing in am and then another 2 puffs about 12 hours later. 04/11/22   Nyoka Cowden, MD  cyclobenzaprine (FLEXERIL) 5 MG tablet Take 1 tablet (5 mg total) by mouth 3 (three) times daily as needed for muscle spasms. Do not drink alcohol or drive while taking this medication.  May cause drowsiness. 03/13/22   Particia Nearing, PA-C   diltiazem (CARDIZEM CD) 120 MG 24 hr capsule Take 120 mg by mouth in the morning. 04/22/21   [provider]  Eluxadoline (VIBERZI) 100 MG TABS Take 1 tablet (100 mg total) by mouth 2 (two) times daily with a meal. 02/03/22   Gelene Mink, NP  halobetasol (ULTRAVATE) 0.05 % ointment Apply 1 application topically daily as needed (hand irritation). 11/26/20   [provider]  HYDROcodone-acetaminophen (NORCO) 7.5-325 MG tablet Take 1 tablet by mouth in the morning, at noon, and at bedtime.    [provider]  hydrocortisone (ANUSOL-HC) 2.5 % rectal cream Place 1 application rectally 2 (two) times daily. 05/11/21   Gelene Mink, NP  ipratropium-albuterol (DUONEB) 0.5-2.5 (3) MG/3ML SOLN Take 3 mLs by nebulization 4 (four) times daily. Patient taking differently: Take 3 mLs by nebulization 4 (four) times daily as needed (wheezing/shortness of breath). 02/20/15   Standley Brooking, MD  naproxen (NAPROSYN) 375 MG tablet Take 1 tablet (375 mg total) by mouth 2 (two) times daily as needed. 03/13/22   Particia Nearing, PA-C  oxybutynin (DITROPAN XL) 15 MG 24 hr tablet Take 15 mg by mouth daily as needed (overactive bladder). 11/03/20   [provider]  pantoprazole (PROTONIX) 40 MG tablet Take 1 tablet (40 mg total) by mouth 2 (two) times daily before a meal. 07/11/16   Houston Siren, MD  Polyethyl Glycol-Propyl Glycol (LUBRICANT EYE  DROPS) 0.4-0.3 % SOLN Place 1-2 drops into both eyes 3 (three) times daily as needed (dry/irritated eyes.).    [provider]  predniSONE (DELTASONE) 10 MG tablet Take  4 each am x 2 days,   2 each am x 2 days,  1 each am x 2 days and stop 04/11/22   Nyoka Cowden, MD  rosuvastatin (CRESTOR) 20 MG tablet Take 20 mg by mouth in the morning.    [provider]  traZODone (DESYREL) 50 MG tablet Take 150 mg by mouth at bedtime.     [provider]      Allergies    Methadone and Aspirin    Review of Systems    Review of Systems  Constitutional:  Negative for fever.  Respiratory:  Positive for cough, shortness of breath and wheezing.     Physical Exam Updated Vital Signs BP 130/88   Pulse 85   Temp 98 F (36.7 C) (Oral)   Resp (!) 22   Ht 5\' 2"  (1.575 m)   Wt 69.9 kg   SpO2 96%   BMI 28.17 kg/m  Physical Exam Vitals and nursing note reviewed.  Constitutional:      General: She is not in acute distress.    Appearance: She is well-developed.  HENT:     Head: Normocephalic and atraumatic.  Eyes:     Extraocular Movements: Extraocular movements intact.     Conjunctiva/sclera: Conjunctivae normal.     Pupils: Pupils are equal, round, and reactive to light.  Cardiovascular:     Rate and Rhythm: Normal rate and regular rhythm.     Heart sounds: No murmur heard. Pulmonary:     Effort: Pulmonary effort is normal. No respiratory distress.     Breath sounds: Examination of the right-upper field reveals wheezing. Examination of the left-upper field reveals wheezing. Examination of the right-middle field reveals wheezing. Examination of the left-middle field reveals wheezing. Examination of the right-lower field reveals wheezing. Examination of the left-lower field reveals wheezing. Wheezing present. No rhonchi or rales.  Abdominal:     Palpations: Abdomen is soft.     Tenderness: There is no abdominal tenderness.  Musculoskeletal:        General: No swelling.     Cervical back: Neck supple.  Skin:    General: Skin is warm and dry.     Capillary Refill: Capillary refill takes less than 2 seconds.  Neurological:     Mental Status: She is alert.  Psychiatric:        Mood and Affect: Mood normal.     ED Results / Procedures / Treatments   Labs (all labs ordered are listed, but only abnormal results are displayed) Labs Reviewed  RESP PANEL BY RT-PCR (RSV, FLU A&B, COVID)  [id number]  BASIC METABOLIC PANEL  BRAIN NATRIURETIC PEPTIDE  CBC WITH DIFFERENTIAL/PLATELET     EKG None  Radiology DG Chest 2 View  Result Date: 05/25/2022 CLINICAL DATA:  Shortness of breath. EXAM: CHEST - 2 VIEW COMPARISON:  December 15, 2020 FINDINGS: The heart size and mediastinal contours are within normal limits. Both lungs are clear. The visualized skeletal structures are unremarkable. IMPRESSION: No active cardiopulmonary disease. Electronically Signed   By: Aram Candela M.D.   On: 05/25/2022 19:58    Procedures Procedures    Medications Ordered in ED Medications  ipratropium-albuterol (DUONEB) 0.5-2.5 (3) MG/3ML nebulizer solution 3 mL (has no administration in time range)  predniSONE (DELTASONE) tablet 60 mg (has no administration  in time range)    ED Course/ Medical Decision Making/ A&P Clinical Course as of 05/25/22 2323  Wed May 25, 2022  2322 Patient here for generalized malaise, shortness of breath and cough for the past week, she was diagnosed with the flu last week..  He has history of COPD, wheezing on exam, nontoxic in appearance, neb treatment ordered.  Awaiting labs. [CB]    Clinical Course User Index [CB] Ma Rings, PA-C                           Medical Decision Making Patient feeling unwell since she was diagnosed with the flu.  Labs are pending and ECG is pending.    X-ray interpreted by me, no pneumonia or pulmonary edema.  Differential diagnose includes but not limited to COPD exacerbation, pneumonia, PE, influenza, other signed out to oncoming team at this time pending the remainder of her workup.  Amount and/or Complexity of Data Reviewed Labs: ordered. Radiology: ordered.  Risk Prescription drug management.           Final Clinical Impression(s) / ED Diagnoses Final diagnoses:  None    Rx / DC Orders ED Discharge Orders     None         Ma Rings, PA-C 05/26/22 0028    Gilda Crease, MD 05/26/22 [phone]

## 2022-05-25 NOTE — ED Triage Notes (Signed)
Pt reports diagnosed with Flu last week and was taking tamiflu.  Went back to pcp today because  still sob and decreased appetite.  Says feels lightheaded when she moves around.  O2 sat 91% on room air.

## 2022-05-25 NOTE — ED Notes (Signed)
Placed on 02 at 2liters nasal cannula,  o2 sat remained 91%.  Increased to 3liters o2 sat 94%

## 2022-05-25 NOTE — Progress Notes (Deleted)
Theresa Pace, female    DOB: 12/04/1960    MRN: [id number]   Brief patient profile:  62   yowf  active smoker since  age 62 with onset asthma during IUP x 4 then more of a chronic problem with doe and aecopd   after that > [facility] admit 2012 and none since  referred to pulmonary clinic in [facility]  04/11/2022 by Reuel Boom  for copd eval with last pft in [place] pt does not know results or when done    History of Present Illness  04/11/2022  Pulmonary/ 1st office eval/ Theresa Pace / [facility] / trelegy  Chief Complaint  Patient presents with   Consult    Ref by Dr. Garner Nash for COPD- SOB and cough   Dyspnea:  no problem at grocery store, [employer] would be tough, steps are a problem says walks about same speed as others  Cough: more than usual x 6 m, esp in am  slt greenish better p augmentin  No assoc sinus complaints  Sleep: bed is flat/ 2 pillows  SABA use: hfa each am  Rec Plan A = Automatic = Always=   Breztri Take 2 puffs first thing in am and then another 2 puffs about 12 hours later.  Work on inhaler technique:  Plan B = Backup (to supplement plan A, not to replace it) Only use your albuterol inhaler as a rescue medication  Plan C = Crisis (instead of Plan B but only if Plan B stops working) - only use your albuterol nebulizer if you first try Plan B  Prednisone 10 mg take  4 each am x 2 days,   2 each am x 2 days,  1 each am x 2 days and stop  Augmentin 875 mg take one pill twice daily  X 10 days Please schedule a follow up office visit in 6 weeks, call sooner if needed PFTs next available    05/25/2022  f/u ov/[place] office/Theresa Pace re: *** maint on ***  No chief complaint on file.   Dyspnea:  *** Cough: *** Sleeping: *** SABA use: *** 02: *** Covid status: *** Lung cancer screening: ***   No obvious day to day or daytime variability or assoc excess/ purulent sputum or mucus plugs or hemoptysis or cp or chest tightness, subjective wheeze or overt sinus or hb symptoms.    *** without nocturnal  or early am exacerbation  of respiratory  c/o's or need for noct saba. Also denies any obvious fluctuation of symptoms with weather or environmental changes or other aggravating or alleviating factors except as outlined above   No unusual exposure hx or h/o childhood pna/ asthma or knowledge of premature birth.  Current Allergies, Complete Past Medical History, Past Surgical History, Family History, and Social History were reviewed in [employer] record.  ROS  The following are not active complaints unless bolded Hoarseness, sore throat, dysphagia, dental problems, itching, sneezing,  nasal congestion or discharge of excess mucus or purulent secretions, ear ache,   fever, chills, sweats, unintended wt loss or wt gain, classically pleuritic or exertional cp,  orthopnea pnd or arm/hand swelling  or leg swelling, presyncope, palpitations, abdominal pain, anorexia, nausea, vomiting, diarrhea  or change in bowel habits or change in bladder habits, change in stools or change in urine, dysuria, hematuria,  rash, arthralgias, visual complaints, headache, numbness, weakness or ataxia or problems with walking or coordination,  change in mood or  memory.  No outpatient medications have been marked as taking for the 05/25/22 encounter (Appointment) with Nyoka Cowden, MD.                Past Medical History:  Diagnosis Date   Arthritis    Asthma    COPD (chronic obstructive pulmonary disease) (HCC)    Depression    patient states she is no longer depressed   Fibromyalgia    Frequent urination at night    GERD (gastroesophageal reflux disease)    Hypercholesteremia    IBS (irritable bowel syndrome)    Tachycardia    treated with Cardizem   Tendonitis        Objective:    Wt Readings from Last 3 Encounters:  04/11/22 168 lb 3.2 oz (76.3 kg)  02/03/22 171 lb (77.6 kg)  12/28/21 166 lb 8 oz (75.5 kg)      Vital signs reviewed   05/25/2022  - Note at rest 02 sats  ***% on ***   General appearance:    ***  edentulous/smoker's  rattle    Min bar***      [facility] and [facility] Dec each year     Assessment

## 2022-05-26 LAB — RESP PANEL BY RT-PCR (RSV, FLU A&B, COVID)  RVPGX2
Influenza A by PCR: POSITIVE — AB
Influenza B by PCR: NEGATIVE
Resp Syncytial Virus by PCR: NEGATIVE
SARS Coronavirus 2 by RT PCR: NEGATIVE

## 2022-05-26 LAB — BASIC METABOLIC PANEL
Anion gap: 8 (ref 5–15)
BUN: 24 mg/dL — ABNORMAL HIGH (ref 6–20)
CO2: 28 mmol/L (ref 22–32)
Calcium: 8.1 mg/dL — ABNORMAL LOW (ref 8.9–10.3)
Chloride: 101 mmol/L (ref 98–111)
Creatinine, Ser: 0.82 mg/dL (ref 0.44–1.00)
GFR, Estimated: >60 mL/min (ref >60)
Glucose, Bld: 99 mg/dL (ref 70–99)
Potassium: 3 mmol/L — ABNORMAL LOW (ref 3.5–5.1)
Sodium: 137 mmol/L (ref 135–145)

## 2022-05-26 LAB — BRAIN NATRIURETIC PEPTIDE: B Natriuretic Peptide: 133 pg/mL — ABNORMAL HIGH (ref 0.0–100.0)

## 2022-05-26 LAB — TROPONIN I (HIGH SENSITIVITY)
Troponin I (High Sensitivity): 12 ng/L (ref <18)
Troponin I (High Sensitivity): 9 ng/L (ref <18)

## 2022-05-26 MED ORDER — POTASSIUM CHLORIDE CRYS ER 20 MEQ PO TBCR
40.0000 meq | EXTENDED_RELEASE_TABLET | Freq: Once | ORAL | Status: AC
  Administered 2022-05-26: 40 meq via ORAL
  Filled 2022-05-26: qty 2

## 2022-05-26 MED ORDER — POTASSIUM CHLORIDE 20 MEQ PO PACK
20.0000 meq | PACK | Freq: Once | ORAL | Status: AC
  Administered 2022-05-26: 20 meq via ORAL
  Filled 2022-05-26: qty 1

## 2022-06-08 ENCOUNTER — Other Ambulatory Visit: Payer: Self-pay

## 2022-06-08 DIAGNOSIS — J449 Chronic obstructive pulmonary disease, unspecified: Secondary | ICD-10-CM

## 2022-06-09 ENCOUNTER — Ambulatory Visit: Payer: Medicaid Other | Admitting: Internal Medicine

## 2022-06-09 NOTE — Progress Notes (Deleted)
Theresa Pace, female    DOB: 06-11-1960    MRN: [id number]   Brief patient profile:  62 yowf  active smoker since  age 62 with onset asthma during IUP x 4 then more of a chronic problem with doe and aecopd   after that > [facility] admit 2012 and none since  referred to pulmonary clinic in [facility]  04/11/2022 by Reuel Boom  for copd eval with last pft in [place] pt does not know results or when done    History of Present Illness  04/11/2022  Pulmonary/ 1st office eval/ Aking Klabunde / [facility] / trelegy  Chief Complaint  Patient presents with   Consult    Ref by Dr. Garner Nash for COPD- SOB and cough   Dyspnea:  no problem at grocery store, [employer] would be tough, steps are a problem says walks about same speed as others  Cough: more than usual x 6 m, esp in am  slt greenish better p augmentin  No assoc sinus complaints  Sleep: bed is flat/ 2 pillows  SABA use: hfa each am  Rec Plan A = Automatic = Always=   Breztri Take 2 puffs first thing in am and then another 2 puffs about 12 hours later.  Work on inhaler technique:  Plan B = Backup (to supplement plan A, not to replace it) Only use your albuterol inhaler as a rescue medication  Plan C = Crisis (instead of Plan B but only if Plan B stops working) - only use your albuterol nebulizer if you first try Plan B  Prednisone 10 mg take  4 each am x 2 days,   2 each am x 2 days,  1 each am x 2 days and stop  Augmentin 875 mg take one pill twice daily  X 10 days Please schedule a follow up office visit in 6 weeks, call sooner if needed PFTs next available    06/09/2022  ACUTE  ov/ office/Anikin Prosser re: *** maint on ***  No chief complaint on file.   Dyspnea:  *** Cough: *** Sleeping: *** SABA use: *** 02: *** Covid status: *** Lung cancer screening: ***   No obvious day to day or daytime variability or assoc excess/ purulent sputum or mucus plugs or hemoptysis or cp or chest tightness, subjective wheeze or overt sinus or hb symptoms.    *** without nocturnal  or early am exacerbation  of respiratory  c/o's or need for noct saba. Also denies any obvious fluctuation of symptoms with weather or environmental changes or other aggravating or alleviating factors except as outlined above   No unusual exposure hx or h/o childhood pna/ asthma or knowledge of premature birth.  Current Allergies, Complete Past Medical History, Past Surgical History, Family History, and Social History were reviewed in [employer] record.  ROS  The following are not active complaints unless bolded Hoarseness, sore throat, dysphagia, dental problems, itching, sneezing,  nasal congestion or discharge of excess mucus or purulent secretions, ear ache,   fever, chills, sweats, unintended wt loss or wt gain, classically pleuritic or exertional cp,  orthopnea pnd or arm/hand swelling  or leg swelling, presyncope, palpitations, abdominal pain, anorexia, nausea, vomiting, diarrhea  or change in bowel habits or change in bladder habits, change in stools or change in urine, dysuria, hematuria,  rash, arthralgias, visual complaints, headache, numbness, weakness or ataxia or problems with walking or coordination,  change in mood or  memory.  No outpatient medications have been marked as taking for the 06/09/22 encounter (Appointment) with Nyoka Cowden, MD.                Past Medical History:  Diagnosis Date   Arthritis    Asthma    COPD (chronic obstructive pulmonary disease) (HCC)    Depression    patient states she is no longer depressed   Fibromyalgia    Frequent urination at night    GERD (gastroesophageal reflux disease)    Hypercholesteremia    IBS (irritable bowel syndrome)    Tachycardia    treated with Cardizem   Tendonitis        Objective:    06/09/2022        ***   05/25/22 154 lb (69.9 kg)  04/11/22 168 lb 3.2 oz (76.3 kg)  02/03/22 171 lb (77.6 kg)      Vital signs reviewed  06/09/2022  - Note at  rest 02 sats  ***% on ***   General appearance:    ***  edentulous/smoker's  rattle    Min bar***      [facility] and [facility] Dec each year     Assessment

## 2022-07-01 ENCOUNTER — Emergency Department (HOSPITAL_BASED_OUTPATIENT_CLINIC_OR_DEPARTMENT_OTHER)
Admission: EM | Admit: 2022-07-01 | Discharge: 2022-07-01 | Disposition: A | Discharge status: Home / Self Care | Payer: Medicaid Other | Attending: Emergency Medicine | Admitting: Emergency Medicine

## 2022-07-01 ENCOUNTER — Encounter (HOSPITAL_BASED_OUTPATIENT_CLINIC_OR_DEPARTMENT_OTHER): Payer: Self-pay

## 2022-07-01 ENCOUNTER — Emergency Department (HOSPITAL_BASED_OUTPATIENT_CLINIC_OR_DEPARTMENT_OTHER): Payer: Medicaid Other

## 2022-07-01 ENCOUNTER — Other Ambulatory Visit: Payer: Self-pay

## 2022-07-01 DIAGNOSIS — C3490 Malignant neoplasm of unspecified part of unspecified bronchus or lung: Secondary | ICD-10-CM | POA: Diagnosis not present

## 2022-07-01 DIAGNOSIS — Z7982 Long term (current) use of aspirin: Secondary | ICD-10-CM | POA: Diagnosis not present

## 2022-07-01 DIAGNOSIS — R000 Tachycardia, unspecified: Secondary | ICD-10-CM

## 2022-07-01 LAB — BASIC METABOLIC PANEL
Anion gap: 12 (ref 5–15)
BUN: 11 mg/dL (ref 6–20)
CO2: 25 mmol/L (ref 22–32)
Calcium: 8.7 mg/dL — ABNORMAL LOW (ref 8.9–10.3)
Chloride: 99 mmol/L (ref 98–111)
Creatinine, Ser: 0.76 mg/dL (ref 0.44–1.00)
GFR, Estimated: >60 mL/min (ref >60)
Glucose, Bld: 97 mg/dL (ref 70–99)
Potassium: 3.9 mmol/L (ref 3.5–5.1)
Sodium: 136 mmol/L (ref 135–145)

## 2022-07-01 LAB — HEPATIC FUNCTION PANEL
ALT: 7 U/L (ref 0–44)
AST: 12 U/L — ABNORMAL LOW (ref 15–41)
Albumin: 2.9 g/dL — ABNORMAL LOW (ref 3.5–5.0)
Alkaline Phosphatase: 103 U/L (ref 38–126)
Bilirubin, Direct: 0.1 mg/dL (ref 0.0–0.2)
Indirect Bilirubin: 0.3 mg/dL (ref 0.3–0.9)
Total Bilirubin: 0.4 mg/dL (ref 0.3–1.2)
Total Protein: 6.3 g/dL — ABNORMAL LOW (ref 6.5–8.1)

## 2022-07-01 LAB — CBC
HCT: 38.5 % (ref 36.0–46.0)
Hemoglobin: 12.7 g/dL (ref 12.0–15.0)
MCH: 30.1 pg (ref 26.0–34.0)
MCHC: 33 g/dL (ref 30.0–36.0)
MCV: 91.2 fL (ref 80.0–100.0)
Platelets: 416 10*3/uL — ABNORMAL HIGH (ref 150–400)
RBC: 4.22 MIL/uL (ref 3.87–5.11)
RDW: 16 % — ABNORMAL HIGH (ref 11.5–15.5)
WBC: 11.1 10*3/uL — ABNORMAL HIGH (ref 4.0–10.5)
nRBC: 0 % (ref 0.0–0.2)

## 2022-07-01 LAB — TROPONIN I (HIGH SENSITIVITY): Troponin I (High Sensitivity): 5 ng/L (ref <18)

## 2022-07-01 MED ORDER — SODIUM CHLORIDE 0.9 % IV BOLUS
1000.0000 mL | Freq: Once | INTRAVENOUS | Status: AC
  Administered 2022-07-01: 1000 mL via INTRAVENOUS

## 2022-07-01 MED ORDER — IOHEXOL 350 MG/ML SOLN
100.0000 mL | Freq: Once | INTRAVENOUS | Status: AC | PRN
  Administered 2022-07-01: 70 mL via INTRAVENOUS

## 2022-07-01 MED ORDER — HYDROMORPHONE HCL 1 MG/ML IJ SOLN
1.0000 mg | Freq: Once | INTRAMUSCULAR | Status: AC
  Administered 2022-07-01: 1 mg via INTRAVENOUS
  Filled 2022-07-01: qty 1

## 2022-07-01 NOTE — ED Triage Notes (Signed)
Patient here POV from Home.  Endorses being at [facility] to have a Refill on Medications and having an ECG completed due to tachycardia and Hypertension.   No SOB. No CP.  NAD Noted during Triage. A&Ox4. GCS 15. Ambulatory.

## 2022-07-01 NOTE — ED Provider Notes (Signed)
[facility] Provider Note   CSN: [id number] Arrival date & time: 07/01/22  1507     History  Chief Complaint  Patient presents with   Abnormal ECG    Theresa Pace is a 62 y.o. female history of lung cancer, chronic pain, here presenting with tachycardia.  Patient went to her primary care doctor today to refill her hydrocodone.  She states that she last filled her medicine January 11 and is due for a refill in 2 days.  She went there and her heart rate was noted to be in the 140s.  Patient states that she has no chest pain or shortness of breath.  Patient states that she has diffuse pain and does feel some palpitations.  Denies any fevers or chills.  Patient states that she is on maintenance therapy for lung cancer.  She follows up with [facility] for her oncology.  The history is provided by the patient.       Home Medications Prior to Admission medications   Medication Sig Start Date End Date Taking? Authorizing Provider  ADDERALL XR 30 MG 24 hr capsule Take 30 mg by mouth in the morning, at noon, and at bedtime. 04/22/21   [provider]  albuterol (PROVENTIL HFA;VENTOLIN HFA) 108 (90 BASE) MCG/ACT inhaler Inhale 2 puffs into the lungs every 6 (six) hours as needed for wheezing or shortness of breath.     [provider]  amoxicillin-clavulanate (AUGMENTIN) 875-125 MG tablet Take 1 tablet by mouth 2 (two) times daily. 04/11/22   Tanda Rockers, MD  ARIPiprazole (ABILIFY) 10 MG tablet Take 10 mg by mouth daily. 01/21/22   [provider]  aspirin EC 81 MG tablet Take 81 mg by mouth every evening.    [provider]  baclofen (LIORESAL) 10 MG tablet Take 10 mg by mouth 3 (three) times daily.    [provider]  Budeson-Glycopyrrol-Formoterol (BREZTRI AEROSPHERE) 160-9-4.8 MCG/ACT AERO Take 2 puffs first thing in am and then another 2 puffs about 12 hours later. 04/11/22   Tanda Rockers, MD   cyclobenzaprine (FLEXERIL) 5 MG tablet Take 1 tablet (5 mg total) by mouth 3 (three) times daily as needed for muscle spasms. Do not drink alcohol or drive while taking this medication.  May cause drowsiness. 03/13/22   Volney American, PA-C  diltiazem (CARDIZEM CD) 120 MG 24 hr capsule Take 120 mg by mouth in the morning. 04/22/21   [provider]  Eluxadoline (VIBERZI) 100 MG TABS Take 1 tablet (100 mg total) by mouth 2 (two) times daily with a meal. 02/03/22   Annitta Needs, NP  halobetasol (ULTRAVATE) 0.05 % ointment Apply 1 application topically daily as needed (hand irritation). 11/26/20   [provider]  HYDROcodone-acetaminophen (NORCO) 7.5-325 MG tablet Take 1 tablet by mouth in the morning, at noon, and at bedtime.    [provider]  hydrocortisone (ANUSOL-HC) 2.5 % rectal cream Place 1 application rectally 2 (two) times daily. 05/11/21   Annitta Needs, NP  ipratropium-albuterol (DUONEB) 0.5-2.5 (3) MG/3ML SOLN Take 3 mLs by nebulization 4 (four) times daily. Patient taking differently: Take 3 mLs by nebulization 4 (four) times daily as needed (wheezing/shortness of breath). 02/20/15   Samuella Cota, MD  naproxen (NAPROSYN) 375 MG tablet Take 1 tablet (375 mg total) by mouth 2 (two) times daily as needed. 03/13/22   Volney American, PA-C  oxybutynin (DITROPAN XL) 15 MG 24  hr tablet Take 15 mg by mouth daily as needed (overactive bladder). 11/03/20   [provider]  pantoprazole (PROTONIX) 40 MG tablet Take 1 tablet (40 mg total) by mouth 2 (two) times daily before a meal. 07/11/16   Orvan Falconer, MD  Polyethyl Glycol-Propyl Glycol (LUBRICANT EYE DROPS) 0.4-0.3 % SOLN Place 1-2 drops into both eyes 3 (three) times daily as needed (dry/irritated eyes.).    [provider]  predniSONE (DELTASONE) 10 MG tablet Take  4 each am x 2 days,   2 each am x 2 days,  1 each am x 2 days and stop 04/11/22   Tanda Rockers, MD  rosuvastatin  (CRESTOR) 20 MG tablet Take 20 mg by mouth in the morning.    [provider]  traZODone (DESYREL) 50 MG tablet Take 150 mg by mouth at bedtime.     [provider]      Allergies    Methadone and Aspirin    Review of Systems   Review of Systems  Cardiovascular:  Positive for palpitations.  All other systems reviewed and are negative.   Physical Exam Updated Vital Signs BP 105/77 (BP Location: Right Arm)   Pulse (!) 159   Temp 97.7 F (36.5 C) (Oral)   Resp 18   Ht 5\' 2"  (1.575 m)   Wt 69.9 kg   SpO2 96%   BMI 28.19 kg/m  Physical Exam Vitals and nursing note reviewed.  Constitutional:      Comments: Chronically ill  HENT:     Head: Normocephalic.     Nose: Nose normal.     Mouth/Throat:     Mouth: Mucous membranes are moist.  Eyes:     Extraocular Movements: Extraocular movements intact.     Pupils: Pupils are equal, round, and reactive to light.  Cardiovascular:     Rate and Rhythm: Regular rhythm. Tachycardia present.     Pulses: Normal pulses.     Heart sounds: Normal heart sounds.  Pulmonary:     Effort: Pulmonary effort is normal.     Breath sounds: Normal breath sounds.  Abdominal:     General: Abdomen is flat.     Palpations: Abdomen is soft.  Musculoskeletal:        General: Normal range of motion.     Cervical back: Normal range of motion and neck supple.  Skin:    General: Skin is warm.     Capillary Refill: Capillary refill takes less than 2 seconds.  Neurological:     General: No focal deficit present.     Mental Status: She is alert and oriented to person, place, and time.  Psychiatric:        Mood and Affect: Mood normal.        Behavior: Behavior normal.     ED Results / Procedures / Treatments   Labs (all labs ordered are listed, but only abnormal results are displayed) Labs Reviewed  CBC - Abnormal; Notable for the following components:      Result Value   WBC 11.1 (*)    RDW 16.0 (*)    Platelets 416 (*)     All other components within normal limits  BASIC METABOLIC PANEL  HEPATIC FUNCTION PANEL  RAPID URINE DRUG SCREEN, HOSP PERFORMED  TROPONIN I (HIGH SENSITIVITY)    EKG EKG Interpretation  Date/Time:  Friday July 01 2022 15:24:20 EST Ventricular Rate:  156 PR Interval:    QRS Duration: 118 QT Interval:  326  QTC Calculation: 525 R Axis:   -19 Text Interpretation: Supraventricular tachycardia Incomplete left bundle branch block Minimal voltage criteria for LVH, may be normal variant ( [employer] product ) Nonspecific ST abnormality Abnormal ECG When compared with ECG of 25-May-2022 19:37, Since last tracing rate faster Confirmed by Wandra Arthurs [phone]) on 07/01/2022 3:43:22 PM  Radiology No results found.  Procedures Procedures    Medications Ordered in ED Medications  HYDROmorphone (DILAUDID) injection 1 mg (has no administration in time range)  sodium chloride 0.9 % bolus 1,000 mL (1,000 mLs Intravenous New Bag/Given 07/01/22 1552)    ED Course/ Medical Decision Making/ A&P                             Medical Decision Making SHAGUN WORDELL is a 62 y.o. female here with tachycardia.  High suspicion for PE given her lung cancer history.  Patient is tachycardic in the 150s.  Unclear if she has sinus tachycardia versus atrial fibrillation.  Patient does not have a history of A-fib and is not anticoagulated.  Plan to get CTA to rule out PE.  Will also get CBC and CMP and troponin.  Will hydrate and give pain medicine and reassess.  5:01 PM I reviewed patient's labs and independently interpreted CT scan.  Labs unremarkable.  Troponin negative.  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  Patient's heart rate is down to 100.  Blood pressure remained stable at 110/64.  Patient had Cardizem on her med list but she states that she no longer is taking it.  Patient does not have atrial fibrillation on her EKG.  She received IV fluids and pain medicine.  I think her  tachycardia is likely from dehydration and pain related.  At this point, I think she is stable for discharge and she can follow-up with her pain management doctor and her oncologist.  Problems Addressed: Primary malignant neoplasm of lung metastatic to other site, unspecified laterality [facility]): acute illness or injury Tachycardia: acute illness or injury  Amount and/or Complexity of Data Reviewed Labs: ordered. Decision-making details documented in ED Course. Radiology: ordered and independent interpretation performed. Decision-making details documented in ED Course. ECG/medicine tests: ordered and independent interpretation performed. Decision-making details documented in ED Course.  Risk Prescription drug management.    Final Clinical Impression(s) / ED Diagnoses Final diagnoses:  None    Rx / DC Orders ED Discharge Orders     None         Drenda Freeze, MD 07/01/22 1702

## 2022-07-01 NOTE — ED Notes (Signed)
Pt A&OX4 ambulatory at d/c with independent steady gait. Pt verbalized understanding of d/c instructions and follow up care. 

## 2022-07-01 NOTE — ED Notes (Signed)
Dr. Yao at bedside. 

## 2022-07-01 NOTE — Discharge Instructions (Signed)
As we discussed, your heart rate was elevated likely from mild dehydration and pain.  Please stay hydrated.  You have metastatic lung cancer and needs to follow-up with your oncologist  Please call your pain management doctor tomorrow about refill of your pain medicines.

## 2022-07-19 ENCOUNTER — Emergency Department (HOSPITAL_COMMUNITY): Payer: Medicaid Other

## 2022-07-19 ENCOUNTER — Inpatient Hospital Stay (HOSPITAL_COMMUNITY)
Admission: EM | Admit: 2022-07-19 | Discharge: 2022-08-22 | DRG: 028 | Disposition: E | Payer: Medicaid Other | Attending: Internal Medicine | Admitting: Internal Medicine

## 2022-07-19 ENCOUNTER — Encounter (HOSPITAL_COMMUNITY): Payer: Self-pay

## 2022-07-19 ENCOUNTER — Other Ambulatory Visit: Payer: Self-pay

## 2022-07-19 DIAGNOSIS — Z1152 Encounter for screening for COVID-19: Secondary | ICD-10-CM

## 2022-07-19 DIAGNOSIS — Z6827 Body mass index (BMI) 27.0-27.9, adult: Secondary | ICD-10-CM

## 2022-07-19 DIAGNOSIS — Z515 Encounter for palliative care: Secondary | ICD-10-CM

## 2022-07-19 DIAGNOSIS — Z79899 Other long term (current) drug therapy: Secondary | ICD-10-CM

## 2022-07-19 DIAGNOSIS — C7931 Secondary malignant neoplasm of brain: Secondary | ICD-10-CM | POA: Diagnosis present

## 2022-07-19 DIAGNOSIS — E869 Volume depletion, unspecified: Secondary | ICD-10-CM | POA: Diagnosis present

## 2022-07-19 DIAGNOSIS — M4804 Spinal stenosis, thoracic region: Secondary | ICD-10-CM | POA: Diagnosis present

## 2022-07-19 DIAGNOSIS — J4489 Other specified chronic obstructive pulmonary disease: Secondary | ICD-10-CM | POA: Diagnosis present

## 2022-07-19 DIAGNOSIS — Z886 Allergy status to analgesic agent status: Secondary | ICD-10-CM

## 2022-07-19 DIAGNOSIS — C7949 Secondary malignant neoplasm of other parts of nervous system: Secondary | ICD-10-CM | POA: Diagnosis present

## 2022-07-19 DIAGNOSIS — J42 Unspecified chronic bronchitis: Secondary | ICD-10-CM | POA: Diagnosis not present

## 2022-07-19 DIAGNOSIS — N390 Urinary tract infection, site not specified: Secondary | ICD-10-CM | POA: Diagnosis present

## 2022-07-19 DIAGNOSIS — M1990 Unspecified osteoarthritis, unspecified site: Secondary | ICD-10-CM | POA: Diagnosis present

## 2022-07-19 DIAGNOSIS — C7951 Secondary malignant neoplasm of bone: Secondary | ICD-10-CM | POA: Diagnosis present

## 2022-07-19 DIAGNOSIS — R000 Tachycardia, unspecified: Secondary | ICD-10-CM | POA: Diagnosis not present

## 2022-07-19 DIAGNOSIS — I480 Paroxysmal atrial fibrillation: Secondary | ICD-10-CM | POA: Diagnosis present

## 2022-07-19 DIAGNOSIS — G9520 Unspecified cord compression: Secondary | ICD-10-CM | POA: Diagnosis present

## 2022-07-19 DIAGNOSIS — C801 Malignant (primary) neoplasm, unspecified: Secondary | ICD-10-CM | POA: Diagnosis not present

## 2022-07-19 DIAGNOSIS — G40909 Epilepsy, unspecified, not intractable, without status epilepticus: Secondary | ICD-10-CM | POA: Diagnosis not present

## 2022-07-19 DIAGNOSIS — M8458XA Pathological fracture in neoplastic disease, other specified site, initial encounter for fracture: Secondary | ICD-10-CM | POA: Diagnosis present

## 2022-07-19 DIAGNOSIS — G992 Myelopathy in diseases classified elsewhere: Secondary | ICD-10-CM | POA: Diagnosis present

## 2022-07-19 DIAGNOSIS — E440 Moderate protein-calorie malnutrition: Secondary | ICD-10-CM | POA: Diagnosis present

## 2022-07-19 DIAGNOSIS — F32A Depression, unspecified: Secondary | ICD-10-CM | POA: Diagnosis present

## 2022-07-19 DIAGNOSIS — Z833 Family history of diabetes mellitus: Secondary | ICD-10-CM

## 2022-07-19 DIAGNOSIS — F909 Attention-deficit hyperactivity disorder, unspecified type: Secondary | ICD-10-CM | POA: Diagnosis present

## 2022-07-19 DIAGNOSIS — Z7189 Other specified counseling: Secondary | ICD-10-CM | POA: Diagnosis not present

## 2022-07-19 DIAGNOSIS — S22000A Wedge compression fracture of unspecified thoracic vertebra, initial encounter for closed fracture: Principal | ICD-10-CM

## 2022-07-19 DIAGNOSIS — R54 Age-related physical debility: Secondary | ICD-10-CM | POA: Diagnosis present

## 2022-07-19 DIAGNOSIS — G9341 Metabolic encephalopathy: Secondary | ICD-10-CM | POA: Diagnosis not present

## 2022-07-19 DIAGNOSIS — Z87891 Personal history of nicotine dependence: Secondary | ICD-10-CM | POA: Diagnosis not present

## 2022-07-19 DIAGNOSIS — G928 Other toxic encephalopathy: Secondary | ICD-10-CM | POA: Diagnosis not present

## 2022-07-19 DIAGNOSIS — Z96651 Presence of right artificial knee joint: Secondary | ICD-10-CM | POA: Diagnosis present

## 2022-07-19 DIAGNOSIS — Z885 Allergy status to narcotic agent status: Secondary | ICD-10-CM

## 2022-07-19 DIAGNOSIS — M797 Fibromyalgia: Secondary | ICD-10-CM | POA: Diagnosis present

## 2022-07-19 DIAGNOSIS — E7800 Pure hypercholesterolemia, unspecified: Secondary | ICD-10-CM | POA: Diagnosis present

## 2022-07-19 DIAGNOSIS — Z781 Physical restraint status: Secondary | ICD-10-CM

## 2022-07-19 DIAGNOSIS — R569 Unspecified convulsions: Secondary | ICD-10-CM | POA: Diagnosis not present

## 2022-07-19 DIAGNOSIS — D630 Anemia in neoplastic disease: Secondary | ICD-10-CM | POA: Diagnosis present

## 2022-07-19 DIAGNOSIS — G894 Chronic pain syndrome: Secondary | ICD-10-CM | POA: Diagnosis present

## 2022-07-19 DIAGNOSIS — R627 Adult failure to thrive: Secondary | ICD-10-CM | POA: Diagnosis present

## 2022-07-19 DIAGNOSIS — I10 Essential (primary) hypertension: Secondary | ICD-10-CM | POA: Diagnosis present

## 2022-07-19 DIAGNOSIS — J449 Chronic obstructive pulmonary disease, unspecified: Secondary | ICD-10-CM | POA: Diagnosis not present

## 2022-07-19 DIAGNOSIS — E876 Hypokalemia: Secondary | ICD-10-CM | POA: Diagnosis not present

## 2022-07-19 DIAGNOSIS — Z85820 Personal history of malignant melanoma of skin: Secondary | ICD-10-CM

## 2022-07-19 DIAGNOSIS — K589 Irritable bowel syndrome without diarrhea: Secondary | ICD-10-CM | POA: Diagnosis present

## 2022-07-19 DIAGNOSIS — T40605A Adverse effect of unspecified narcotics, initial encounter: Secondary | ICD-10-CM | POA: Diagnosis not present

## 2022-07-19 DIAGNOSIS — I4891 Unspecified atrial fibrillation: Secondary | ICD-10-CM | POA: Diagnosis not present

## 2022-07-19 DIAGNOSIS — R4182 Altered mental status, unspecified: Secondary | ICD-10-CM | POA: Diagnosis not present

## 2022-07-19 DIAGNOSIS — K219 Gastro-esophageal reflux disease without esophagitis: Secondary | ICD-10-CM | POA: Diagnosis present

## 2022-07-19 DIAGNOSIS — C439 Malignant melanoma of skin, unspecified: Secondary | ICD-10-CM | POA: Diagnosis not present

## 2022-07-19 DIAGNOSIS — Z801 Family history of malignant neoplasm of trachea, bronchus and lung: Secondary | ICD-10-CM

## 2022-07-19 DIAGNOSIS — Z79891 Long term (current) use of opiate analgesic: Secondary | ICD-10-CM

## 2022-07-19 DIAGNOSIS — Z66 Do not resuscitate: Secondary | ICD-10-CM | POA: Diagnosis not present

## 2022-07-19 DIAGNOSIS — R52 Pain, unspecified: Secondary | ICD-10-CM | POA: Diagnosis not present

## 2022-07-19 DIAGNOSIS — Z7951 Long term (current) use of inhaled steroids: Secondary | ICD-10-CM

## 2022-07-19 LAB — COMPREHENSIVE METABOLIC PANEL
ALT: 13 U/L (ref 0–44)
AST: 17 U/L (ref 15–41)
Albumin: 2.1 g/dL — ABNORMAL LOW (ref 3.5–5.0)
Alkaline Phosphatase: 95 U/L (ref 38–126)
Anion gap: 11 (ref 5–15)
BUN: 23 mg/dL — ABNORMAL HIGH (ref 6–20)
CO2: 24 mmol/L (ref 22–32)
Calcium: 7.6 mg/dL — ABNORMAL LOW (ref 8.9–10.3)
Chloride: 101 mmol/L (ref 98–111)
Creatinine, Ser: 0.54 mg/dL (ref 0.44–1.00)
GFR, Estimated: >60 mL/min (ref >60)
Glucose, Bld: 123 mg/dL — ABNORMAL HIGH (ref 70–99)
Potassium: 3.6 mmol/L (ref 3.5–5.1)
Sodium: 136 mmol/L (ref 135–145)
Total Bilirubin: 0.5 mg/dL (ref 0.3–1.2)
Total Protein: 5.8 g/dL — ABNORMAL LOW (ref 6.5–8.1)

## 2022-07-19 LAB — URINALYSIS, W/ REFLEX TO CULTURE (INFECTION SUSPECTED)
Bilirubin Urine: NEGATIVE
Glucose, UA: NEGATIVE mg/dL
Hgb urine dipstick: NEGATIVE
Ketones, ur: NEGATIVE mg/dL
Nitrite: POSITIVE — AB
Protein, ur: 30 mg/dL — AB
Specific Gravity, Urine: >1.046 — ABNORMAL HIGH (ref 1.005–1.030)
pH: 5 (ref 5.0–8.0)

## 2022-07-19 LAB — CBC WITH DIFFERENTIAL/PLATELET
Abs Immature Granulocytes: 0.07 10*3/uL (ref 0.00–0.07)
Basophils Absolute: 0.1 10*3/uL (ref 0.0–0.1)
Basophils Relative: 1 %
Eosinophils Absolute: 0.1 10*3/uL (ref 0.0–0.5)
Eosinophils Relative: 1 %
HCT: 34.7 % — ABNORMAL LOW (ref 36.0–46.0)
Hemoglobin: 11.1 g/dL — ABNORMAL LOW (ref 12.0–15.0)
Immature Granulocytes: 1 %
Lymphocytes Relative: 13 %
Lymphs Abs: 1.5 10*3/uL (ref 0.7–4.0)
MCH: 30.1 pg (ref 26.0–34.0)
MCHC: 32 g/dL (ref 30.0–36.0)
MCV: 94 fL (ref 80.0–100.0)
Monocytes Absolute: 0.8 10*3/uL (ref 0.1–1.0)
Monocytes Relative: 7 %
Neutro Abs: 8.6 10*3/uL — ABNORMAL HIGH (ref 1.7–7.7)
Neutrophils Relative %: 77 %
Platelets: 535 10*3/uL — ABNORMAL HIGH (ref 150–400)
RBC: 3.69 MIL/uL — ABNORMAL LOW (ref 3.87–5.11)
RDW: 17.4 % — ABNORMAL HIGH (ref 11.5–15.5)
WBC: 11.1 10*3/uL — ABNORMAL HIGH (ref 4.0–10.5)
nRBC: 0 % (ref 0.0–0.2)

## 2022-07-19 LAB — RESP PANEL BY RT-PCR (RSV, FLU A&B, COVID)  RVPGX2
Influenza A by PCR: NEGATIVE
Influenza B by PCR: NEGATIVE
Resp Syncytial Virus by PCR: NEGATIVE
SARS Coronavirus 2 by RT PCR: NEGATIVE

## 2022-07-19 LAB — TROPONIN I (HIGH SENSITIVITY)
Troponin I (High Sensitivity): 7 ng/L (ref <18)
Troponin I (High Sensitivity): 8 ng/L (ref <18)

## 2022-07-19 MED ORDER — GADOBUTROL 1 MMOL/ML IV SOLN
7.0000 mL | Freq: Once | INTRAVENOUS | Status: AC | PRN
  Administered 2022-07-19: 7 mL via INTRAVENOUS

## 2022-07-19 MED ORDER — SODIUM CHLORIDE 0.9 % IV SOLN: 250.0000 mL | INTRAVENOUS | Status: DC | PRN

## 2022-07-19 MED ORDER — ACETAMINOPHEN 325 MG PO TABS: 650.0000 mg | ORAL_TABLET | Freq: Four times a day (QID) | ORAL | Status: DC | PRN

## 2022-07-19 MED ORDER — DEXAMETHASONE SODIUM PHOSPHATE 10 MG/ML IJ SOLN
10.0000 mg | Freq: Once | INTRAMUSCULAR | Status: AC
  Administered 2022-07-19: 10 mg via INTRAVENOUS
  Filled 2022-07-19: qty 1

## 2022-07-19 MED ORDER — HYDROMORPHONE HCL 1 MG/ML IJ SOLN
1.0000 mg | Freq: Once | INTRAMUSCULAR | Status: AC
  Administered 2022-07-19: 1 mg via INTRAVENOUS
  Filled 2022-07-19: qty 1

## 2022-07-19 MED ORDER — IOHEXOL 300 MG/ML  SOLN
100.0000 mL | Freq: Once | INTRAMUSCULAR | Status: AC | PRN
  Administered 2022-07-19: 100 mL via INTRAVENOUS

## 2022-07-19 MED ORDER — POLYETHYLENE GLYCOL 3350 17 G PO PACK: 17.0000 g | PACK | Freq: Every day | ORAL | Status: DC | PRN

## 2022-07-19 MED ORDER — SODIUM CHLORIDE 0.9% FLUSH: 3.0000 mL | INTRAVENOUS | Status: DC | PRN

## 2022-07-19 MED ORDER — LACTATED RINGERS IV BOLUS
1000.0000 mL | Freq: Once | INTRAVENOUS | Status: AC
  Administered 2022-07-19: 1000 mL via INTRAVENOUS

## 2022-07-19 MED ORDER — METHOCARBAMOL 1000 MG/10ML IJ SOLN
500.0000 mg | Freq: Four times a day (QID) | INTRAVENOUS | Status: DC | PRN
  Administered 2022-07-21 – 2022-07-24 (×3): 500 mg via INTRAVENOUS
  Filled 2022-07-19 (×3): qty 500

## 2022-07-19 MED ORDER — HYDROMORPHONE HCL 1 MG/ML IJ SOLN
0.5000 mg | INTRAMUSCULAR | Status: DC | PRN
  Administered 2022-07-20: 0.5 mg via INTRAVENOUS
  Administered 2022-07-20 – 2022-07-24 (×17): 1 mg via INTRAVENOUS
  Filled 2022-07-19 (×18): qty 1

## 2022-07-19 MED ORDER — FENTANYL CITRATE PF 50 MCG/ML IJ SOSY
50.0000 ug | PREFILLED_SYRINGE | Freq: Once | INTRAMUSCULAR | Status: AC | PRN
  Administered 2022-07-19: 50 ug via INTRAVENOUS
  Filled 2022-07-19: qty 1

## 2022-07-19 MED ORDER — METOPROLOL TARTRATE 5 MG/5ML IV SOLN
5.0000 mg | Freq: Four times a day (QID) | INTRAVENOUS | Status: DC | PRN
  Administered 2022-07-24 – 2022-07-26 (×3): 5 mg via INTRAVENOUS
  Filled 2022-07-19 (×2): qty 5

## 2022-07-19 MED ORDER — SODIUM CHLORIDE 0.9% FLUSH
3.0000 mL | Freq: Two times a day (BID) | INTRAVENOUS | Status: DC
  Administered 2022-07-20: 3 mL via INTRAVENOUS

## 2022-07-19 MED ORDER — ACETAMINOPHEN 650 MG RE SUPP: 650.0000 mg | Freq: Four times a day (QID) | RECTAL | Status: DC | PRN

## 2022-07-19 MED ORDER — DOCUSATE SODIUM 100 MG PO CAPS
100.0000 mg | ORAL_CAPSULE | Freq: Two times a day (BID) | ORAL | Status: DC
  Administered 2022-07-20 – 2022-07-28 (×9): 100 mg via ORAL
  Filled 2022-07-19 (×11): qty 1

## 2022-07-19 MED ORDER — BISACODYL 10 MG RE SUPP
10.0000 mg | Freq: Every day | RECTAL | Status: DC | PRN
  Administered 2022-07-24: 10 mg via RECTAL
  Filled 2022-07-19: qty 1

## 2022-07-19 MED ORDER — FLEET ENEMA 7-19 GM/118ML RE ENEM: 1.0000 | ENEMA | Freq: Once | RECTAL | Status: DC | PRN

## 2022-07-19 MED ORDER — HYDROCODONE-ACETAMINOPHEN 5-325 MG PO TABS
1.0000 | ORAL_TABLET | ORAL | Status: DC | PRN
  Administered 2022-07-19 – 2022-07-25 (×6): 2 via ORAL
  Filled 2022-07-19 (×7): qty 2

## 2022-07-19 MED ORDER — DEXAMETHASONE SODIUM PHOSPHATE 4 MG/ML IJ SOLN
4.0000 mg | Freq: Four times a day (QID) | INTRAMUSCULAR | Status: DC
  Administered 2022-07-20 – 2022-07-21 (×5): 4 mg via INTRAVENOUS
  Filled 2022-07-19 (×5): qty 1

## 2022-07-19 NOTE — ED Notes (Signed)
Pt arrived to [facility] ED.

## 2022-07-19 NOTE — H&P (Signed)
Theresa Pace is an 62 y.o. female.   Chief Complaint: Back Pain HPI: Theresa Pace is a 62 year old female with a past medical history for metastatic melanoma, tachycardia, seizures, COPD, left bundle branch block, tachycardia, fibromyalgia, GERD, hypercholesterolemia, and IBS. She is currently being treated at [facility] for her metastatic melanoma. She has about a two week history of worsening back pain, with weakness of bilateral lower extremities.  She initially was ambulating with a cane.  She progressed to a walker, and most recently has been nonambulatory. She denies numbness or tingling of her lower extremities.  She denies bowel or bladder dysfunction. She presented to the [facility] emergency department where thoracic and lumbar MRIs were performed.  These demonstrated multiple osseous metastases involving the thoracic and lumbar spine. She has osseous metastases involving the T8 and T9 vertebral bodies with associated pathologic compression fractures. She has associated extraosseous and epidural extension of tumor into the ventral epidural space at the level of T9 with resultant severe spinal stenosis and cord compression. She was given 10 mg dexamethasone in the [facility] ED. She feels this has helped her pain and weakness some. She was transferred to [facility] in anticipation of thoracic decompressive surgery.    Past Medical History:  Diagnosis Date   Arthritis    Asthma    Cancer ([place])    COPD (chronic obstructive pulmonary disease) ([place])    Depression    patient states she is no longer depressed   Fibromyalgia    Frequent urination at night    GERD (gastroesophageal reflux disease)    Hypercholesteremia    IBS (irritable bowel syndrome)    Tachycardia    treated with Cardizem   Tendonitis     Past Surgical History:  Procedure Laterality Date   ABDOMINAL HYSTERECTOMY     APPENDECTOMY     BIOPSY  05/31/2021   Procedure: BIOPSY;  Surgeon: Daneil Dolin, MD;  Location: [facility]  SU[facility];  Service: Endoscopy;;   COLONOSCOPY  05/2014   2 sessile polyps, 3-7 mm in size (Tubular adenomas) and internal hemorrhoids. diverticulosis   COLONOSCOPY WITH PROPOFOL N/A 05/31/2021   three 4-6 mm polyps, segmental biopsies of colon, normal TI. Tubular adenomas and hyperplastic. Negative colonic biopsies for microscopic colitis. Surveillance in 5 years.   KNEE ARTHROPLASTY Right 05/23/2017   POLYPECTOMY  05/31/2021   Procedure: POLYPECTOMY;  Surgeon: Daneil Dolin, MD;  Location: [facility];  Service: Endoscopy;;   skin cancer removal from back      Family History  Problem Relation Age of Onset   Lung cancer Mother        deceased at 68   Diabetes Brother    Colon cancer Neg Hx    Social History:  reports that she quit smoking about 10 months ago. Her smoking use included cigarettes. She smoked an average of .5 packs per day. She has been exposed to tobacco smoke. She has never used smokeless tobacco. She reports that she does not drink alcohol and does not use drugs.  Allergies:  Allergies  Allergen Reactions   Methadone Hives   Aspirin Other (See Comments)    Stomach Pain    (Not in a hospital admission)   Results for orders placed or performed during the hospital encounter of 07/20/2022 (from the past 48 hour(s))  Comprehensive metabolic panel     Status: Abnormal   Collection Time: 07/03/2022  3:55 PM  Result Value Ref Range   Sodium  136 135 - 145 mmol/L   Potassium 3.6 3.5 - 5.1 mmol/L   Chloride 101 98 - 111 mmol/L   CO2 24 22 - 32 mmol/L   Glucose, Bld 123 (H) 70 - 99 mg/dL    Comment: Glucose reference range applies only to samples taken after fasting for at least 8 hours.   BUN 23 (H) 6 - 20 mg/dL   Creatinine, Ser 0.54 0.44 - 1.00 mg/dL   Calcium 7.6 (L) 8.9 - 10.3 mg/dL   Total Protein 5.8 (L) 6.5 - 8.1 g/dL   Albumin 2.1 (L) 3.5 - 5.0 g/dL   AST 17 15 - 41 U/L   ALT 13 0 - 44 U/L   Alkaline Phosphatase 95 38 - 126 U/L   Total Bilirubin 0.5 0.3 -  1.2 mg/dL   GFR, Estimated >60 >60 mL/min    Comment: (NOTE) Calculated using the CKD-EPI Creatinine Equation (2021)    Anion gap 11 5 - 15    Comment: Performed at [facility], [address] [place], [place] [address]  Troponin I (High Sensitivity)     Status: None   Collection Time: 06/26/2022  3:55 PM  Result Value Ref Range   Troponin I (High Sensitivity) 7 <18 ng/L    Comment: (NOTE) Elevated high sensitivity troponin I (hsTnI) values and significant  changes across serial measurements may suggest ACS but many other  chronic and acute conditions are known to elevate hsTnI results.  Refer to the "Links" section for chest pain algorithms and additional  guidance. Performed at [facility], [address], [place], [place] [address]   CBC with Differential     Status: Abnormal   Collection Time: 07/17/2022  3:55 PM  Result Value Ref Range   WBC 11.1 (H) 4.0 - 10.5 K/uL   RBC 3.69 (L) 3.87 - 5.11 MIL/uL   Hemoglobin 11.1 (L) 12.0 - 15.0 g/dL   HCT 34.7 (L) 36.0 - 46.0 %   MCV 94.0 80.0 - 100.0 fL   MCH 30.1 26.0 - 34.0 pg   MCHC 32.0 30.0 - 36.0 g/dL   RDW 17.4 (H) 11.5 - 15.5 %   Platelets 535 (H) 150 - 400 K/uL   nRBC 0.0 0.0 - 0.2 %   Neutrophils Relative % 77 %   Neutro Abs 8.6 (H) 1.7 - 7.7 K/uL   Lymphocytes Relative 13 %   Lymphs Abs 1.5 0.7 - 4.0 K/uL   Monocytes Relative 7 %   Monocytes Absolute 0.8 0.1 - 1.0 K/uL   Eosinophils Relative 1 %   Eosinophils Absolute 0.1 0.0 - 0.5 K/uL   Basophils Relative 1 %   Basophils Absolute 0.1 0.0 - 0.1 K/uL   Immature Granulocytes 1 %   Abs Immature Granulocytes 0.07 0.00 - 0.07 K/uL    Comment: Performed at [facility], [address], [place], [place] [address]  Resp panel by RT-PCR (RSV, Flu A&B, Covid) Anterior Nasal Swab     Status: None   Collection Time: 07/10/2022  5:15 PM   Specimen: Anterior Nasal Swab  Result Value Ref Range   SARS Coronavirus 2 by RT PCR NEGATIVE NEGATIVE    Comment: (NOTE) SARS-CoV-2 target  nucleic acids are NOT DETECTED.  The SARS-CoV-2 RNA is generally detectable in upper respiratory specimens during the acute phase of infection. The lowest concentration of SARS-CoV-2 viral copies this assay can detect is 138 copies/mL. A negative result does not preclude SARS-Cov-2 infection and should not be used as the  sole basis for treatment or other patient management decisions. A negative result may occur with  improper specimen collection/handling, submission of specimen other than nasopharyngeal swab, presence of viral mutation(s) within the areas targeted by this assay, and inadequate number of viral copies(<138 copies/mL). A negative result must be combined with clinical observations, patient history, and epidemiological information. The expected result is Negative.  Fact Sheet for Patients:  EntrepreneurPulse.com.au  Fact Sheet for Healthcare Providers:  IncredibleEmployment.be  This test is no t yet approved or cleared by the [place] FDA and  has been authorized for detection and/or diagnosis of SARS-CoV-2 by FDA under an Emergency Use Authorization (EUA). This EUA will remain  in effect (meaning this test can be used) for the duration of the COVID-19 declaration under Section 564(b)(1) of the Act, 21 U.S.C.section 360bbb-3(b)(1), unless the authorization is terminated  or revoked sooner.       Influenza A by PCR NEGATIVE NEGATIVE   Influenza B by PCR NEGATIVE NEGATIVE    Comment: (NOTE) The Xpert Xpress SARS-CoV-2/FLU/RSV plus assay is intended as an aid in the diagnosis of influenza from Nasopharyngeal swab specimens and should not be used as a sole basis for treatment. Nasal washings and aspirates are unacceptable for Xpert Xpress SARS-CoV-2/FLU/RSV testing.  Fact Sheet for Patients: EntrepreneurPulse.com.au  Fact Sheet for Healthcare Providers: IncredibleEmployment.be  This test  is not yet approved or cleared by the [place] FDA and has been authorized for detection and/or diagnosis of SARS-CoV-2 by FDA under an Emergency Use Authorization (EUA). This EUA will remain in effect (meaning this test can be used) for the duration of the COVID-19 declaration under Section 564(b)(1) of the Act, 21 U.S.C. section 360bbb-3(b)(1), unless the authorization is terminated or revoked.     Resp Syncytial Virus by PCR NEGATIVE NEGATIVE    Comment: (NOTE) Fact Sheet for Patients: EntrepreneurPulse.com.au  Fact Sheet for Healthcare Providers: IncredibleEmployment.be  This test is not yet approved or cleared by the [place] FDA and has been authorized for detection and/or diagnosis of SARS-CoV-2 by FDA under an Emergency Use Authorization (EUA). This EUA will remain in effect (meaning this test can be used) for the duration of the COVID-19 declaration under Section 564(b)(1) of the Act, 21 U.S.C. section 360bbb-3(b)(1), unless the authorization is terminated or revoked.  Performed at [facility], [address], [place], [place] [address]   Troponin I (High Sensitivity)     Status: None   Collection Time: 06/30/2022  5:15 PM  Result Value Ref Range   Troponin I (High Sensitivity) 8 <18 ng/L    Comment: (NOTE) Elevated high sensitivity troponin I (hsTnI) values and significant  changes across serial measurements may suggest ACS but many other  chronic and acute conditions are known to elevate hsTnI results.  Refer to the "Links" section for chest pain algorithms and additional  guidance. Performed at [facility], [address], [place], [place] [address]   Urinalysis, w/ Reflex to Culture (Infection Suspected) -Urine, Clean Catch     Status: Abnormal   Collection Time: 07/10/2022  8:05 PM  Result Value Ref Range   Specimen Source URINE, CLEAN CATCH    Color, Urine YELLOW YELLOW   APPearance HAZY (A) CLEAR   Specific Gravity,  Urine >1.046 (H) 1.005 - 1.030   pH 5.0 5.0 - 8.0   Glucose, UA NEGATIVE NEGATIVE mg/dL   Hgb urine dipstick NEGATIVE NEGATIVE   Bilirubin Urine NEGATIVE NEGATIVE   Ketones, ur NEGATIVE NEGATIVE mg/dL  Protein, ur 30 (A) NEGATIVE mg/dL   Nitrite POSITIVE (A) NEGATIVE   Leukocytes,Ua TRACE (A) NEGATIVE   RBC / HPF 0-5 0 - 5 RBC/hpf   WBC, UA 6-10 0 - 5 WBC/hpf    Comment:        Reflex urine culture not performed if WBC <=10, OR if Squamous epithelial cells >5. If Squamous epithelial cells >5 suggest recollection.    Bacteria, UA MANY (A) NONE SEEN   Squamous Epithelial / HPF 11-20 0 - 5 /HPF   Mucus PRESENT    Hyaline Casts, UA PRESENT     Comment: Performed at [facility], [address], [place], [place] [address]   MR Lumbar Spine W Wo Contrast  Result Date: 07/01/2022 CLINICAL DATA:  Initial evaluation for low back pain. History of metastatic disease. EXAM: MRI LUMBAR SPINE WITHOUT AND WITH CONTRAST TECHNIQUE: Multiplanar and multiecho pulse sequences of the lumbar spine were obtained without and with intravenous contrast. CONTRAST:  88m GADAVIST GADOBUTROL 1 MMOL/ML IV SOLN COMPARISON:  Prior CT from earlier the same day. FINDINGS: Segmentation: Standard. Lowest well-formed disc space labeled the L5-S1 level. Alignment: Straightening of the normal lumbar lordosis. Trace degenerative anterolisthesis of L2 on L3. Vertebrae: Multiple osseous metastases seen involving the lumbar spine. These are seen involving the L1, L2, and L5 vertebral bodies. Largest of the lesion seen at L2 and measures 1.9 cm (series 29, image 10). No extra osseous or epidural extension of tumor. No pathologic compression fracture. Conus medullaris and cauda equina: Conus extends to the L1-2 level. Conus and cauda equina appear normal. No abnormal enhancement. Paraspinal and other soft tissues: Multiple scattered soft tissue and mesenteric metastatic implants seen throughout the visualized external soft tissues in  abdomen. Presumed metastatic lesion involving the left kidney noted. Findings are better characterized on prior CT. Disc levels: L1-2: Normal interspace. Mild right-sided facet hypertrophy. No canal or foraminal stenosis. L2-3: Trace anterolisthesis. Disc desiccation with mild disc bulge. Moderate right with mild left facet hypertrophy. No spinal stenosis. Foramina remain adequately patent. L3-4: Disc desiccation with minimal disc bulge. Mild right greater than left facet hypertrophy. No spinal stenosis. Foramina remain adequately patent. L4-5: Disc desiccation with mild disc bulge. Superimposed shallow left subarticular disc protrusion mildly indents the left ventral thecal sac (series 31, image 28). Mild to moderate bilateral facet hypertrophy. No significant canal or lateral recess stenosis. Foramina remain patent. L5-S1:  Unremarkable. IMPRESSION: 1. Multiple osseous metastases involving the lumbar spine as above, largest of which measures 1.9 cm at the L2 vertebral body. No extra osseous or epidural extension of tumor. No pathologic compression fracture. 2. Multiple scattered soft tissue and mesenteric metastatic implants, better characterized on prior CT. 3. Underlying mild for age multilevel degenerative disc disease and facet hypertrophy as above. No significant stenosis or overt neural impingement. Electronically Signed   By: BJeannine BogaM.D.   On: 07/20/2022 19:54   MR THORACIC SPINE W WO CONTRAST  Result Date: 07/11/2022 CLINICAL DATA:  Initial evaluation for mid back pain. History of metastatic disease. EXAM: MRI THORACIC WITHOUT AND WITH CONTRAST TECHNIQUE: Multiplanar and multiecho pulse sequences of the thoracic spine were obtained without and with intravenous contrast. CONTRAST:  738mGADAVIST GADOBUTROL 1 MMOL/ML IV SOLN COMPARISON:  Prior CTs from earlier the same day as well as 07/01/2022. FINDINGS: Alignment: Physiologic with preservation of the normal thoracic kyphosis. No listhesis.  Vertebrae: Osseous metastases involving the T8 and T9 vertebral bodies are seen. There is diffuse involvement of the  T8 and T9 vertebral bodies. Extension into the posterior elements, greater at T9. Abnormal posterior convex bowing of the posterior vertebral margin. Associated extraosseous and epidural extension of tumor into the ventral epidural space at the level of T9. Associated severe spinal stenosis at this level, with the thecal sac measuring 4-5 mm in AP diameter at its most narrow point. Secondary cord flattening with subtle cord signal changes, concerning for edema and/or myelomalacia (series 3, image 10). Early epidural extension into the T9-10 and to a lesser extent the T8-9 neural foramina noted as well. Associated pathologic compression fractures of T8 and T9 noted as well, most pronounced at T9 or height loss measures up to approximately 50%. Additional heterogeneous T1/T2 hyperintense lesion at T5 noted, favored to reflect a heterogeneous hemangioma. Few additional scattered subcentimeter benign hemangiomata noted. No other definite metastatic lesions. Cord: Severe spinal stenosis with cord compression at T9. Subtle cord signal changes concerning for edema and/or myelomalacia. No other abnormal intracanalicular enhancement. Paraspinal and other soft tissues: Multiple scattered soft tissue metastatic implants present throughout the posterior paraspinous soft tissues. Largest of these are seen near the left supraclavicular region and measure up to approximately 4 cm (series 4, image 1). Patient's known right upper lobe mass is partially visualized. Few additional scattered pulmonary nodules noted as well. Metastatic liver lesions partially visualize, better seen on prior CT. Disc levels: Multilevel degenerative spondylosis noted within the visualized cervical spine on counter sequence, but without high-grade spinal stenosis. No significant disc pathology seen within the thoracic spine itself for  patient age. No other spinal stenosis. Foramina remain patent. IMPRESSION: 1. 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 Associated extraosseous and epidural extension of tumor into the ventral epidural space at the level of T9 with resultant severe spinal stenosis and cord compression. Subtle cord signal changes concerning for edema and/or myelomalacia. Emergent neuro surgical and/or radiation oncology consultation recommended. 2. Partial visualization of patient's known right upper lobe mass. Multiple metastatic soft tissue implants with additional hepatic metastases, better evaluated on prior CTs. 3. Heterogeneous lesion involving the T5 vertebral body, favored to reflect a hemangioma. Attention at follow-up recommended. Electronically Signed   By: Jeannine Boga M.D.   On: 07/05/2022 19:44   CT ABDOMEN PELVIS W CONTRAST  Result Date: 06/28/2022 CLINICAL DATA:  Abdominal pain, metastatic cancer not otherwise specified EXAM: CT ABDOMEN AND PELVIS WITH CONTRAST TECHNIQUE: Multidetector CT imaging of the abdomen and pelvis was performed using the standard protocol following bolus administration of intravenous contrast. RADIATION DOSE REDUCTION: This exam was performed according to the departmental dose-optimization program which includes automated exposure control, adjustment of the mA and/or kV according to patient size and/or use of iterative reconstruction technique. CONTRAST:  157m OMNIPAQUE IOHEXOL 300 MG/ML  SOLN COMPARISON:  07/01/2022 FINDINGS: Lower chest: Stable subcentimeter bilateral lower lobe pulmonary nodules consistent with known metastatic disease. Trace pericardial effusion, with soft tissue density in the pericardium anteriorly measuring up to 1.8 cm, concerning for pericardial implant. Pleural base metastatic disease within the left anterior costophrenic sulcus measures 4.1 x 3.5 cm, previously measuring 3.3 x 2.8 cm.  Hepatobiliary: Multiple hepatic hypodensities are identified, consistent with diffuse liver metastases. Largest in the right lobe reference image 11/2 measures 3.5 x 3.1 cm. There are multiple small noncalcified gallstones versus gallbladder polyps. No evidence of acute cholecystitis. No biliary duct dilation. Pancreas: There are multiple indeterminate hypodensities throughout the pancreatic parenchyma, measuring up to 1.4 cm in the pancreatic head  and 1.3 cm in the pancreatic tail. No pancreatic duct dilation. No acute inflammatory change. Spleen: Normal in size without focal abnormality. Adrenals/Urinary Tract: There is a 3.2 x 2.9 cm hypodense mass within the posterior mid left kidney, either primary or metastatic disease. No urinary tract calculi or obstructive uropathy. The adrenals appear grossly normal. Bladder is unremarkable. Stomach/Bowel: There is no bowel obstruction or ileus. There are numerous mesenteric masses which displaced loops of bowel, largest in the right mid abdomen measuring 8.4 x 6.6 cm, consistent with diffuse peritoneal metastases. Vascular/Lymphatic: Atherosclerosis throughout the aorta and its branches. There is no discrete adenopathy, though numerous soft tissue masses are seen throughout the subcutaneous tissues, mesentery, and retroperitoneum, consistent with diffuse metastatic disease. Reproductive: Status post hysterectomy. No adnexal masses. Other: There are multiple soft tissue masses throughout the subcutaneous tissues, mesentery, retroperitoneum, consistent with diffuse metastatic disease. Several index lesions seen on prior chest CT from earlier this month have enlarged, within the upper anterior abdominal wall reference image 19/2 measuring 2.9 x 2.3 cm, and within the left posterolateral chest wall image 1/2 measuring 3.2 x 2.5 cm. Largest mesenteric mass is described above. Largest mass in the lower pelvis right adnexal region measures 6.3 x 5.0 cm reference image 67/2.  Trace pelvic free fluid.  No free intraperitoneal gas. Musculoskeletal: Stable metastatic disease is seen within the T8 and T9 vertebral bodies, with associated pathologic fractures. No retropulsion. Lucent lesions are also seen within the bilateral iliac bones consistent with additional metastatic foci. No new fractures since prior exam. Reconstructed images demonstrate no additional findings. IMPRESSION: 1. Diffuse progressive metastatic disease throughout the lower chest, abdomen, and pelvis. There are new and enlarging soft tissue masses throughout the lungs, pleura, subcutaneous tissues, mesentery, and retroperitoneum as described above. 2. Hypodense lesions throughout the liver, left kidney, and pancreas, consistent with diffuse metastatic disease. 3. Trace pericardial effusion, with suspected pericardial implants seen along the anterior pericardial border. 4. Multiple bony metastases as above, with stable pathologic fractures of the T8 and T9 vertebral bodies. 5. Noncalcified gallstones versus gallbladder polyps. No evidence of acute cholecystitis. 6.  Aortic Atherosclerosis (ICD10-I70.0). Electronically Signed   By: Randa Ngo M.D.   On: 07/10/2022 17:58   DG Chest 2 View  Result Date: 07/01/2022 CLINICAL DATA:  Weakness. EXAM: CHEST - 2 VIEW COMPARISON:  Radiograph of May 25, 2022. CT scan of July 01, 2022. FINDINGS: Normal cardiac size. Lobular right perihilar mass is noted concerning for malignancy. There is associated atelectasis of the right upper lobe. Left lung is clear. Bony thorax is unremarkable. IMPRESSION: Lobular right perihilar mass is noted concerning for neoplasm or malignancy as noted on recent CT scan. There is associated subsegmental atelectasis of the right upper lobe. Electronically Signed   By: Marijo Conception M.D.   On: 06/24/2022 16:32    Review of Systems  Constitutional:  Positive for activity change.  HENT: Negative.    Eyes: Negative.   Respiratory:  Positive  for cough. Negative for shortness of breath.        Chronic cough  Cardiovascular: Negative.   Gastrointestinal: Negative.   Endocrine: Negative.   Genitourinary:  Positive for frequency.  Musculoskeletal:  Positive for arthralgias, back pain and gait problem. Negative for myalgias and neck pain.  Skin:        Multiple nodules on her chest and upper back  Allergic/Immunologic: Negative.   Neurological:  Positive for weakness.  Psychiatric/Behavioral: Negative.      Blood pressure  122/75, pulse (!) 114, temperature 98.4 F (36.9 C), temperature source Oral, resp. rate 20, height '5\' 2"'$  (1.575 m), weight 69.9 kg, SpO2 94 %. Physical Exam Constitutional:      Appearance: Normal appearance.  HENT:     Head: Normocephalic.     Nose: Nose normal.     Mouth/Throat:     Mouth: Mucous membranes are dry.  Eyes:     Extraocular Movements: Extraocular movements intact.     Conjunctiva/sclera: Conjunctivae normal.     Pupils: Pupils are equal, round, and reactive to light.  Cardiovascular:     Rate and Rhythm: Tachycardia present.  Pulmonary:     Effort: Pulmonary effort is normal. No respiratory distress.  Abdominal:     Palpations: Abdomen is soft.  Musculoskeletal:     Cervical back: Normal range of motion and neck supple.     Comments: Hip flexion 4/5 bilaterally Knee extension 4/5 bilaterally Plantar and dorsiflexion 4/5 bilaterally  Skin:    General: Skin is warm and dry.     Capillary Refill: Capillary refill takes less than 2 seconds.     Comments: Multiple nodules  Neurological:     Mental Status: She is alert and oriented to person, place, and time.     GCS: GCS verbal subscore is 5. GCS motor subscore is 6.     Motor: Weakness present.      Assessment/Plan Patient with severe thoracic spinal stenosis related to pathological fractures of the T8 and T9 vertebrae and epidural extension of the tumors. She will need to be taken to the OR by Dr. Annette Stable for decompression of her  thoracic spine. Dr. Annette Stable will further discuss this procedure with the patient. She should be made NPO at 5:00 am on 07/20/2022. Will continue IV steroids and pain management.  Patricia Nettle, NP 07/18/2022, 11:17 PM

## 2022-07-19 NOTE — ED Provider Notes (Signed)
[facility] Provider Note   CSN: [id number] Arrival date & time: 06/30/2022  1357     History  Chief Complaint  Patient presents with   Weakness    Theresa Pace is a 62 y.o. female.  HPI 62 year old female with a history of melanoma who is currently receiving treatment with her first treatment about 3 weeks ago presents with bilateral leg weakness.  Over the past 3 weeks she has gone from needing to walk with a cane to walking with a walker to being unable to walk.  Both legs feel weak.  She has chronic back pain for 6+ months in her lumbar region and states she has a compressed vertebrae.  She had previous surgery for a thoracic melanoma mass.  She has a chronic cough but no new or worsening cough.  She has had some on and off chest pain over the last few days though none right now.  For the past week or so she has had abdominal pain.  She has had some vomiting and gagging when trying to eat.  No diarrhea.  Her oncologist told her to come to the ER. No numbness or incontinence  Home Medications Prior to Admission medications   Medication Sig Start Date End Date Taking? Authorizing Provider  acetaminophen (TYLENOL) 325 MG tablet Take by mouth. 01/18/21  Yes [provider]  ADDERALL XR 30 MG 24 hr capsule Take 30 mg by mouth in the morning, at noon, and at bedtime. 04/22/21  Yes [provider]  albuterol (PROVENTIL HFA;VENTOLIN HFA) 108 (90 BASE) MCG/ACT inhaler Inhale 2 puffs into the lungs every 6 (six) hours as needed for wheezing or shortness of breath.    Yes [provider]  albuterol (PROVENTIL) (2.5 MG/3ML) 0.083% nebulizer solution Inhale into the lungs. 02/14/13  Yes [provider]  ARIPiprazole (ABILIFY) 10 MG tablet Take 10 mg by mouth daily. 01/21/22  Yes [provider]  aspirin EC 81 MG tablet Take 81 mg by mouth every evening.   Yes [provider]  baclofen (LIORESAL) 10 MG  tablet Take 10 mg by mouth 3 (three) times daily.   Yes [provider]  budesonide (PULMICORT) 0.5 MG/2ML nebulizer solution Inhale into the lungs. 11/29/18  Yes [provider]  cyclobenzaprine (FLEXERIL) 5 MG tablet Take 1 tablet (5 mg total) by mouth 3 (three) times daily as needed for muscle spasms. Do not drink alcohol or drive while taking this medication.  May cause drowsiness. 03/13/22  Yes Volney American, PA-C  diltiazem (CARDIZEM CD) 120 MG 24 hr capsule Take 120 mg by mouth in the morning. 04/22/21  Yes [provider]  Eluxadoline (VIBERZI) 100 MG TABS Take 1 tablet (100 mg total) by mouth 2 (two) times daily with a meal. 02/03/22  Yes Annitta Needs, NP  Fluticasone-Umeclidin-Vilant (TRELEGY ELLIPTA) 100-62.5-25 MCG/ACT AEPB Inhale into the lungs. 01/13/21  Yes [provider]  HYDROcodone-acetaminophen (NORCO) 10-325 MG tablet Take 1 tablet by mouth 3 (three) times daily.   Yes [provider]  naloxone [facility]) nasal spray 4 mg/0.1 mL SMARTSIG:Both Nares 06/02/22  Yes [provider]  ondansetron (ZOFRAN-ODT) 8 MG disintegrating tablet Take 8 mg by mouth 3 (three) times daily. 05/31/22  Yes [provider]  rosuvastatin (CRESTOR) 20 MG tablet Take 20 mg by mouth every evening.   Yes [provider]  traZODone (DESYREL) 50 MG tablet Take 150 mg by mouth at bedtime.  Yes [provider]  amoxicillin-clavulanate (AUGMENTIN) 875-125 MG tablet Take 1 tablet by mouth 2 (two) times daily. Patient not taking: Reported on 06/25/2022 04/11/22   Tanda Rockers, MD  naproxen (NAPROSYN) 375 MG tablet Take 1 tablet (375 mg total) by mouth 2 (two) times daily as needed. Patient not taking: Reported on 06/29/2022 03/13/22   Volney American, PA-C  Polyethyl Glycol-Propyl Glycol (LUBRICANT EYE DROPS) 0.4-0.3 % SOLN Place 1-2 drops into both eyes 3 (three) times daily as needed (dry/irritated eyes.).    [provider]      Allergies    Methadone and Aspirin    Review of Systems   Review of Systems  Constitutional:  Negative for fever.  Respiratory:  Positive for cough. Negative for shortness of breath.   Cardiovascular:  Positive for chest pain.  Gastrointestinal:  Positive for abdominal pain and vomiting. Negative for diarrhea.  Genitourinary:  Negative for dysuria.  Neurological:  Positive for weakness. Negative for numbness.    Physical Exam Updated Vital Signs BP 106/70   Pulse (!) 119   Temp 98.4 F (36.9 C)   Resp 20   Ht '5\' 2"'$  (1.575 m)   Wt 69.9 kg   SpO2 95%   BMI 28.19 kg/m  Physical Exam Vitals and nursing note reviewed.  Constitutional:      General: She is not in acute distress.    Appearance: She is well-developed. She is not ill-appearing or diaphoretic.  HENT:     Head: Normocephalic and atraumatic.  Cardiovascular:     Rate and Rhythm: Normal rate and regular rhythm.     Pulses:          Posterior tibial pulses are 2+ on the right side and 2+ on the left side.     Heart sounds: Normal heart sounds.  Pulmonary:     Effort: Pulmonary effort is normal.     Breath sounds: Rhonchi present.  Abdominal:     Palpations: Abdomen is soft.     Tenderness: There is abdominal tenderness.  Musculoskeletal:     Lumbar back: Tenderness present.       Back:  Skin:    General: Skin is warm and dry.  Neurological:     Mental Status: She is alert.     Comments: Patient seems to have good strength in her bilateral upper extremities but has 3/5 strength in her lower extremities.  Grossly normal sensation throughout.     ED Results / Procedures / Treatments   Labs (all labs ordered are listed, but only abnormal results are displayed) Labs Reviewed  COMPREHENSIVE METABOLIC PANEL - Abnormal; Notable for the following components:      Result Value   Glucose, Bld 123 (*)    BUN 23 (*)    Calcium 7.6 (*)    Total Protein 5.8 (*)    Albumin 2.1 (*)    All  other components within normal limits  CBC WITH DIFFERENTIAL/PLATELET - Abnormal; Notable for the following components:   WBC 11.1 (*)    RBC 3.69 (*)    Hemoglobin 11.1 (*)    HCT 34.7 (*)    RDW 17.4 (*)    Platelets 535 (*)    Neutro Abs 8.6 (*)    All other components within normal limits  RESP PANEL BY RT-PCR (RSV, FLU A&B, COVID)  [id number]  URINALYSIS, W/ REFLEX TO CULTURE (INFECTION SUSPECTED)  TROPONIN I (HIGH SENSITIVITY)  TROPONIN I (HIGH SENSITIVITY)  EKG EKG Interpretation  Date/Time:  Tuesday July 19 2022 18:48:51 EST Ventricular Rate:  132 PR Interval:  115 QRS Duration: 100 QT Interval:  354 QTC Calculation: 525 R Axis:   54 Text Interpretation: Sinus tachycardia Abnormal T, consider ischemia, diffuse leads Prolonged QT interval Baseline wander in lead(s) II similar to Jul 01 2022 Confirmed by Sherwood Gambler [phone]) on 06/24/2022 7:31:15 PM  Radiology MR Lumbar Spine W Wo Contrast  Result Date: 07/01/2022 CLINICAL DATA:  Initial evaluation for low back pain. History of metastatic disease. EXAM: MRI LUMBAR SPINE WITHOUT AND WITH CONTRAST TECHNIQUE: Multiplanar and multiecho pulse sequences of the lumbar spine were obtained without and with intravenous contrast. CONTRAST:  30m GADAVIST GADOBUTROL 1 MMOL/ML IV SOLN COMPARISON:  Prior CT from earlier the same day. FINDINGS: Segmentation: Standard. Lowest well-formed disc space labeled the L5-S1 level. Alignment: Straightening of the normal lumbar lordosis. Trace degenerative anterolisthesis of L2 on L3. Vertebrae: Multiple osseous metastases seen involving the lumbar spine. These are seen involving the L1, L2, and L5 vertebral bodies. Largest of the lesion seen at L2 and measures 1.9 cm (series 29, image 10). No extra osseous or epidural extension of tumor. No pathologic compression fracture. Conus medullaris and cauda equina: Conus extends to the L1-2 level. Conus and cauda equina appear normal. No abnormal enhancement.  Paraspinal and other soft tissues: Multiple scattered soft tissue and mesenteric metastatic implants seen throughout the visualized external soft tissues in abdomen. Presumed metastatic lesion involving the left kidney noted. Findings are better characterized on prior CT. Disc levels: L1-2: Normal interspace. Mild right-sided facet hypertrophy. No canal or foraminal stenosis. L2-3: Trace anterolisthesis. Disc desiccation with mild disc bulge. Moderate right with mild left facet hypertrophy. No spinal stenosis. Foramina remain adequately patent. L3-4: Disc desiccation with minimal disc bulge. Mild right greater than left facet hypertrophy. No spinal stenosis. Foramina remain adequately patent. L4-5: Disc desiccation with mild disc bulge. Superimposed shallow left subarticular disc protrusion mildly indents the left ventral thecal sac (series 31, image 28). Mild to moderate bilateral facet hypertrophy. No significant canal or lateral recess stenosis. Foramina remain patent. L5-S1:  Unremarkable. IMPRESSION: 1. Multiple osseous metastases involving the lumbar spine as above, largest of which measures 1.9 cm at the L2 vertebral body. No extra osseous or epidural extension of tumor. No pathologic compression fracture. 2. Multiple scattered soft tissue and mesenteric metastatic implants, better characterized on prior CT. 3. Underlying mild for age multilevel degenerative disc disease and facet hypertrophy as above. No significant stenosis or overt neural impingement. Electronically Signed   By: BJeannine BogaM.D.   On: 06/27/2022 19:54   MR THORACIC SPINE W WO CONTRAST  Result Date: 07/01/2022 CLINICAL DATA:  Initial evaluation for mid back pain. History of metastatic disease. EXAM: MRI THORACIC WITHOUT AND WITH CONTRAST TECHNIQUE: Multiplanar and multiecho pulse sequences of the thoracic spine were obtained without and with intravenous contrast. CONTRAST:  771mGADAVIST GADOBUTROL 1 MMOL/ML IV SOLN COMPARISON:   Prior CTs from earlier the same day as well as 07/01/2022. FINDINGS: Alignment: Physiologic with preservation of the normal thoracic kyphosis. No listhesis. Vertebrae: Osseous metastases involving the T8 and T9 vertebral bodies are seen. There is diffuse involvement of the T8 and T9 vertebral bodies. Extension into the posterior elements, greater at T9. Abnormal posterior convex bowing of the posterior vertebral margin. Associated extraosseous and epidural extension of tumor into the ventral epidural space at the level of T9. Associated severe spinal stenosis at this level, with the thecal  sac measuring 4-5 mm in AP diameter at its most narrow point. Secondary cord flattening with subtle cord signal changes, concerning for edema and/or myelomalacia (series 3, image 10). Early epidural extension into the T9-10 and to a lesser extent the T8-9 neural foramina noted as well. Associated pathologic compression fractures of T8 and T9 noted as well, most pronounced at T9 or height loss measures up to approximately 50%. Additional heterogeneous T1/T2 hyperintense lesion at T5 noted, favored to reflect a heterogeneous hemangioma. Few additional scattered subcentimeter benign hemangiomata noted. No other definite metastatic lesions. Cord: Severe spinal stenosis with cord compression at T9. Subtle cord signal changes concerning for edema and/or myelomalacia. No other abnormal intracanalicular enhancement. Paraspinal and other soft tissues: Multiple scattered soft tissue metastatic implants present throughout the posterior paraspinous soft tissues. Largest of these are seen near the left supraclavicular region and measure up to approximately 4 cm (series 4, image 1). Patient's known right upper lobe mass is partially visualized. Few additional scattered pulmonary nodules noted as well. Metastatic liver lesions partially visualize, better seen on prior CT. Disc levels: Multilevel degenerative spondylosis noted within the  visualized cervical spine on counter sequence, but without high-grade spinal stenosis. No significant disc pathology seen within the thoracic spine itself for patient age. No other spinal stenosis. Foramina remain patent. IMPRESSION: 1. 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 Associated extraosseous and epidural extension of tumor into the ventral epidural space at the level of T9 with resultant severe spinal stenosis and cord compression. Subtle cord signal changes concerning for edema and/or myelomalacia. Emergent neuro surgical and/or radiation oncology consultation recommended. 2. Partial visualization of patient's known right upper lobe mass. Multiple metastatic soft tissue implants with additional hepatic metastases, better evaluated on prior CTs. 3. Heterogeneous lesion involving the T5 vertebral body, favored to reflect a hemangioma. Attention at follow-up recommended. Electronically Signed   By: Jeannine Boga M.D.   On: 07/02/2022 19:44   CT ABDOMEN PELVIS W CONTRAST  Result Date: 06/28/2022 CLINICAL DATA:  Abdominal pain, metastatic cancer not otherwise specified EXAM: CT ABDOMEN AND PELVIS WITH CONTRAST TECHNIQUE: Multidetector CT imaging of the abdomen and pelvis was performed using the standard protocol following bolus administration of intravenous contrast. RADIATION DOSE REDUCTION: This exam was performed according to the departmental dose-optimization program which includes automated exposure control, adjustment of the mA and/or kV according to patient size and/or use of iterative reconstruction technique. CONTRAST:  166m OMNIPAQUE IOHEXOL 300 MG/ML  SOLN COMPARISON:  07/01/2022 FINDINGS: Lower chest: Stable subcentimeter bilateral lower lobe pulmonary nodules consistent with known metastatic disease. Trace pericardial effusion, with soft tissue density in the pericardium anteriorly measuring up to 1.8 cm, concerning  for pericardial implant. Pleural base metastatic disease within the left anterior costophrenic sulcus measures 4.1 x 3.5 cm, previously measuring 3.3 x 2.8 cm. Hepatobiliary: Multiple hepatic hypodensities are identified, consistent with diffuse liver metastases. Largest in the right lobe reference image 11/2 measures 3.5 x 3.1 cm. There are multiple small noncalcified gallstones versus gallbladder polyps. No evidence of acute cholecystitis. No biliary duct dilation. Pancreas: There are multiple indeterminate hypodensities throughout the pancreatic parenchyma, measuring up to 1.4 cm in the pancreatic head and 1.3 cm in the pancreatic tail. No pancreatic duct dilation. No acute inflammatory change. Spleen: Normal in size without focal abnormality. Adrenals/Urinary Tract: There is a 3.2 x 2.9 cm hypodense mass within the posterior mid left kidney, either primary or metastatic disease. No urinary tract calculi or  obstructive uropathy. The adrenals appear grossly normal. Bladder is unremarkable. Stomach/Bowel: There is no bowel obstruction or ileus. There are numerous mesenteric masses which displaced loops of bowel, largest in the right mid abdomen measuring 8.4 x 6.6 cm, consistent with diffuse peritoneal metastases. Vascular/Lymphatic: Atherosclerosis throughout the aorta and its branches. There is no discrete adenopathy, though numerous soft tissue masses are seen throughout the subcutaneous tissues, mesentery, and retroperitoneum, consistent with diffuse metastatic disease. Reproductive: Status post hysterectomy. No adnexal masses. Other: There are multiple soft tissue masses throughout the subcutaneous tissues, mesentery, retroperitoneum, consistent with diffuse metastatic disease. Several index lesions seen on prior chest CT from earlier this month have enlarged, within the upper anterior abdominal wall reference image 19/2 measuring 2.9 x 2.3 cm, and within the left posterolateral chest wall image 1/2 measuring  3.2 x 2.5 cm. Largest mesenteric mass is described above. Largest mass in the lower pelvis right adnexal region measures 6.3 x 5.0 cm reference image 67/2. Trace pelvic free fluid.  No free intraperitoneal gas. Musculoskeletal: Stable metastatic disease is seen within the T8 and T9 vertebral bodies, with associated pathologic fractures. No retropulsion. Lucent lesions are also seen within the bilateral iliac bones consistent with additional metastatic foci. No new fractures since prior exam. Reconstructed images demonstrate no additional findings. IMPRESSION: 1. Diffuse progressive metastatic disease throughout the lower chest, abdomen, and pelvis. There are new and enlarging soft tissue masses throughout the lungs, pleura, subcutaneous tissues, mesentery, and retroperitoneum as described above. 2. Hypodense lesions throughout the liver, left kidney, and pancreas, consistent with diffuse metastatic disease. 3. Trace pericardial effusion, with suspected pericardial implants seen along the anterior pericardial border. 4. Multiple bony metastases as above, with stable pathologic fractures of the T8 and T9 vertebral bodies. 5. Noncalcified gallstones versus gallbladder polyps. No evidence of acute cholecystitis. 6.  Aortic Atherosclerosis (ICD10-I70.0). Electronically Signed   By: Randa Ngo M.D.   On: 07/01/2022 17:58   DG Chest 2 View  Result Date: 07/05/2022 CLINICAL DATA:  Weakness. EXAM: CHEST - 2 VIEW COMPARISON:  Radiograph of May 25, 2022. CT scan of July 01, 2022. FINDINGS: Normal cardiac size. Lobular right perihilar mass is noted concerning for malignancy. There is associated atelectasis of the right upper lobe. Left lung is clear. Bony thorax is unremarkable. IMPRESSION: Lobular right perihilar mass is noted concerning for neoplasm or malignancy as noted on recent CT scan. There is associated subsegmental atelectasis of the right upper lobe. Electronically Signed   By: Marijo Conception M.D.    On: 06/28/2022 16:32    Procedures .Critical Care  Performed by: Sherwood Gambler, MD Authorized by: Sherwood Gambler, MD   Critical care provider statement:    Critical care time (minutes):  60   Critical care time was exclusive of:  Separately billable procedures and treating other patients   Critical care was necessary to treat or prevent imminent or life-threatening deterioration of the following conditions:  CNS failure or compromise   Critical care was time spent personally by me on the following activities:  Development of treatment plan with patient or surrogate, discussions with consultants, evaluation of patient's response to treatment, examination of patient, ordering and review of laboratory studies, ordering and review of radiographic studies, ordering and performing treatments and interventions, pulse oximetry, re-evaluation of patient's condition and review of old charts     Medications Ordered in ED Medications  fentaNYL (SUBLIMAZE) injection 50 mcg (has no administration in time range)  lactated ringers bolus 1,000 mL (1,000  mLs Intravenous New Bag/Given 07/16/2022 1620)  iohexol (OMNIPAQUE) 300 MG/ML solution 100 mL (100 mLs Intravenous Contrast Given 07/07/2022 1730)  gadobutrol (GADAVIST) 1 MMOL/ML injection 7 mL (7 mLs Intravenous Contrast Given 06/27/2022 1849)  HYDROmorphone (DILAUDID) injection 1 mg (1 mg Intravenous Given 07/07/2022 1912)  dexamethasone (DECADRON) injection 10 mg (10 mg Intravenous Given 07/10/2022 2041)    ED Course/ Medical Decision Making/ A&P Clinical Course as of 07/12/2022 2119  Tue Jul 19, 2022  2037 I discussed with neurosurgery, Dr. Annette Stable.  Patient will need surgery tonight to relieve spinal cord compression.  He agrees with Decadron now, 10 mg.  However, given she has had extensive treatment at [facility], it makes sense to consider transferring to [facility] if she would like.  She would prefer to do that if possible so we will call [facility] and see  but if they have no availability then she would have to have surgery in [place] and Dr. Annette Stable will operate tonight if needed. [SG]  2047 Discussed with oncology at [facility], Dr Vashti Hey.  They do not have any beds and she would need a surgical procedure first.  Also discussed with neurosurgery there but because of no beds he cannot except. [SG]  2059 Discussed again with Dr. Annette Stable.  He advises ED to ED, he will see and evaluate and likely take to the OR.  Patient updated. [SG]    Clinical Course User Index [SG] Sherwood Gambler, MD                             Medical Decision Making Amount and/or Complexity of Data Reviewed Labs: ordered.    Details: Troponin unremarkable.  Electrolytes unremarkable.  Mild leukocytosis is probably reactive to her situation. Radiology: ordered and independent interpretation performed.    Details: Multiple metastases on the CT but the thoracic spine MRI shows cord compression. ECG/medicine tests: ordered and independent interpretation performed.    Details: No acute ischemia.  Risk Prescription drug management.   As above, patient's weakness seems to be coming from cord compression.  Neurosurgery will ultimately take to the OR at [facility].  [facility] does not have capacity to except given no beds.  She was given Decadron.  She was given a dose of Dilaudid for pain after she returned from MRI.  I updated family.  Patient will will be transferred urgently ED to ED and I made EDP, Dr. Zenia Resides aware.        Final Clinical Impression(s) / ED Diagnoses Final diagnoses:  Compression fracture of body of thoracic vertebra ([place])  Spinal cord compression [facility])    Rx / DC Orders ED Discharge Orders     None         Sherwood Gambler, MD 07/09/2022 2120

## 2022-07-19 NOTE — ED Notes (Signed)
Pt has been in CT and MRI and now has returned.

## 2022-07-19 NOTE — ED Notes (Signed)
Pt given turkey sandwich bag 

## 2022-07-19 NOTE — ED Notes (Signed)
Pt placed on purewick 

## 2022-07-19 NOTE — ED Notes (Signed)
ED TO INPATIENT HANDOFF REPORT  ED Nurse Name and Phone #: Annie Main [phone]  S Name/Age/Gender Theresa Pace 62 y.o. female Room/Bed: 016C/016C  Code Status   Code Status: Full Code  Home/SNF/Other Home Patient oriented to: self, place, time, and situation Is this baseline? Yes   Triage Complete: Triage complete  Chief Complaint Thoracic spinal stenosis [M48.04]  Triage Note Pt reports she is undergoing chemo for cancer and she has become very weak and is having trouble standing now.   Allergies Allergies  Allergen Reactions   Methadone Hives   Aspirin Other (See Comments)    Stomach Pain    Level of Care/Admitting Diagnosis ED Disposition     ED Disposition  Admit   Condition  --   Comment  Hospital Area: [facility] [[id number]]  Level of Care: Progressive [102]  Admit to Progressive based on following criteria: NEUROLOGICAL AND NEUROSURGICAL complex patients with significant risk of instability, who do not meet ICU criteria, yet require close observation or frequent assessment (< / = every 2 - 4 hours) with medical / nursing intervention.  May admit patient to [facility] or [facility] if equivalent level of care is available:: No  Covid Evaluation: Asymptomatic - no recent exposure (last 10 days) testing not required  Diagnosis: Thoracic spinal stenosis [id number]  Admitting Physician: [place]  Attending Physician: Earnie Larsson [[id number]]  Bed request comments: 4NP  Certification:: I certify this patient will need inpatient services for at least 2 midnights  Estimated Length of Stay: 3          B Medical/Surgery History Past Medical History:  Diagnosis Date   Arthritis    Asthma    Cancer ([place])    COPD (chronic obstructive pulmonary disease) ([place])    Depression    patient states she is no longer depressed   Fibromyalgia    Frequent urination at night    GERD (gastroesophageal reflux disease)    Hypercholesteremia    IBS  (irritable bowel syndrome)    Tachycardia    treated with Cardizem   Tendonitis    Past Surgical History:  Procedure Laterality Date   ABDOMINAL HYSTERECTOMY     APPENDECTOMY     BIOPSY  05/31/2021   Procedure: BIOPSY;  Surgeon: Daneil Dolin, MD;  [facility];  Service: Endoscopy;;   COLONOSCOPY  05/2014   2 sessile polyps, 3-7 mm in size (Tubular adenomas) and internal hemorrhoids. diverticulosis   COLONOSCOPY WITH PROPOFOL N/A 05/31/2021   three 4-6 mm polyps, segmental biopsies of colon, normal TI. Tubular adenomas and hyperplastic. Negative colonic biopsies for microscopic colitis. Surveillance in 5 years.   KNEE ARTHROPLASTY Right 05/23/2017   POLYPECTOMY  05/31/2021   Procedure: POLYPECTOMY;  Surgeon: Daneil Dolin, MD;  Location[facility];  Service: Endoscopy;;   skin cancer removal from back       A IV Location/Drains/Wounds Patient Lines/Drains/Airways Status     Active Line/Drains/Airways     Name Placement date Placement time Site Days   Peripheral IV 07/20/2022 22 G Anterior;Left Forearm 07/06/2022  2136  Forearm  less than 1            Intake/Output Last 24 hours No intake or output data in the 24 hours ending 07/04/2022 2317  Labs/Imaging Results for orders placed or performed during the hospital encounter of 07/13/2022 (from the past 48 hour(s))  Comprehensive metabolic panel     Status: Abnormal   Collection  Time: 07/06/2022  3:55 PM  Result Value Ref Range   Sodium 136 135 - 145 mmol/L   Potassium 3.6 3.5 - 5.1 mmol/L   Chloride 101 98 - 111 mmol/L   CO2 24 22 - 32 mmol/L   Glucose, Bld 123 (H) 70 - 99 mg/dL    Comment: Glucose reference range applies only to samples taken after fasting for at least 8 hours.   BUN 23 (H) 6 - 20 mg/dL   Creatinine, Ser 0.54 0.44 - 1.00 mg/dL   Calcium 7.6 (L) 8.9 - 10.3 mg/dL   Total Protein 5.8 (L) 6.5 - 8.1 g/dL   Albumin 2.1 (L) 3.5 - 5.0 g/dL   AST 17 15 - 41 U/L   ALT 13 0 - 44 U/L   Alkaline  Phosphatase 95 38 - 126 U/L   Total Bilirubin 0.5 0.3 - 1.2 mg/dL   GFR, Estimated >60 >60 mL/min    Comment: (NOTE) Calculated using the CKD-EPI Creatinine Equation (2021)    Anion gap 11 5 - 15    Comment: Performed at [facility], [address] [place], [place] [address]  Troponin I (High Sensitivity)     Status: None   Collection Time: 07/20/2022  3:55 PM  Result Value Ref Range   Troponin I (High Sensitivity) 7 <18 ng/L    Comment: (NOTE) Elevated high sensitivity troponin I (hsTnI) values and significant  changes across serial measurements may suggest ACS but many other  chronic and acute conditions are known to elevate hsTnI results.  Refer to the "Links" section for chest pain algorithms and additional  guidance. Performed at [facility], [address], [place], [place] [address]   CBC with Differential     Status: Abnormal   Collection Time: 07/16/2022  3:55 PM  Result Value Ref Range   WBC 11.1 (H) 4.0 - 10.5 K/uL   RBC 3.69 (L) 3.87 - 5.11 MIL/uL   Hemoglobin 11.1 (L) 12.0 - 15.0 g/dL   HCT 34.7 (L) 36.0 - 46.0 %   MCV 94.0 80.0 - 100.0 fL   MCH 30.1 26.0 - 34.0 pg   MCHC 32.0 30.0 - 36.0 g/dL   RDW 17.4 (H) 11.5 - 15.5 %   Platelets 535 (H) 150 - 400 K/uL   nRBC 0.0 0.0 - 0.2 %   Neutrophils Relative % 77 %   Neutro Abs 8.6 (H) 1.7 - 7.7 K/uL   Lymphocytes Relative 13 %   Lymphs Abs 1.5 0.7 - 4.0 K/uL   Monocytes Relative 7 %   Monocytes Absolute 0.8 0.1 - 1.0 K/uL   Eosinophils Relative 1 %   Eosinophils Absolute 0.1 0.0 - 0.5 K/uL   Basophils Relative 1 %   Basophils Absolute 0.1 0.0 - 0.1 K/uL   Immature Granulocytes 1 %   Abs Immature Granulocytes 0.07 0.00 - 0.07 K/uL    Comment: Performed at [facility], [address], [place], [place] [address]  Resp panel by RT-PCR (RSV, Flu A&B, Covid) Anterior Nasal Swab     Status: None   Collection Time: 07/09/2022  5:15 PM   Specimen: Anterior Nasal Swab  Result Value Ref Range   SARS Coronavirus 2 by RT PCR  NEGATIVE NEGATIVE    Comment: (NOTE) SARS-CoV-2 target nucleic acids are NOT DETECTED.  The SARS-CoV-2 RNA is generally detectable in upper respiratory specimens during the acute phase of infection. The lowest concentration of SARS-CoV-2 viral copies this assay can detect is 138 copies/mL. A negative  result does not preclude SARS-Cov-2 infection and should not be used as the sole basis for treatment or other patient management decisions. A negative result may occur with  improper specimen collection/handling, submission of specimen other than nasopharyngeal swab, presence of viral mutation(s) within the areas targeted by this assay, and inadequate number of viral copies(<138 copies/mL). A negative result must be combined with clinical observations, patient history, and epidemiological information. The expected result is Negative.  Fact Sheet for Patients:  EntrepreneurPulse.com.au  Fact Sheet for Healthcare Providers:  IncredibleEmployment.be  This test is no t yet approved or cleared by the [place] FDA and  has been authorized for detection and/or diagnosis of SARS-CoV-2 by FDA under an Emergency Use Authorization (EUA). This EUA will remain  in effect (meaning this test can be used) for the duration of the COVID-19 declaration under Section 564(b)(1) of the Act, 21 U.S.C.section 360bbb-3(b)(1), unless the authorization is terminated  or revoked sooner.       Influenza A by PCR NEGATIVE NEGATIVE   Influenza B by PCR NEGATIVE NEGATIVE    Comment: (NOTE) The Xpert Xpress SARS-CoV-2/FLU/RSV plus assay is intended as an aid in the diagnosis of influenza from Nasopharyngeal swab specimens and should not be used as a sole basis for treatment. Nasal washings and aspirates are unacceptable for Xpert Xpress SARS-CoV-2/FLU/RSV testing.  Fact Sheet for Patients: EntrepreneurPulse.com.au  Fact Sheet for Healthcare  Providers: IncredibleEmployment.be  This test is not yet approved or cleared by the [place] FDA and has been authorized for detection and/or diagnosis of SARS-CoV-2 by FDA under an Emergency Use Authorization (EUA). This EUA will remain in effect (meaning this test can be used) for the duration of the COVID-19 declaration under Section 564(b)(1) of the Act, 21 U.S.C. section 360bbb-3(b)(1), unless the authorization is terminated or revoked.     Resp Syncytial Virus by PCR NEGATIVE NEGATIVE    Comment: (NOTE) Fact Sheet for Patients: EntrepreneurPulse.com.au  Fact Sheet for Healthcare Providers: IncredibleEmployment.be  This test is not yet approved or cleared by the [place] FDA and has been authorized for detection and/or diagnosis of SARS-CoV-2 by FDA under an Emergency Use Authorization (EUA). This EUA will remain in effect (meaning this test can be used) for the duration of the COVID-19 declaration under Section 564(b)(1) of the Act, 21 U.S.C. section 360bbb-3(b)(1), unless the authorization is terminated or revoked.  Performed at [facility], [address], [place], [place] [address]   Troponin I (High Sensitivity)     Status: None   Collection Time: 06/30/2022  5:15 PM  Result Value Ref Range   Troponin I (High Sensitivity) 8 <18 ng/L    Comment: (NOTE) Elevated high sensitivity troponin I (hsTnI) values and significant  changes across serial measurements may suggest ACS but many other  chronic and acute conditions are known to elevate hsTnI results.  Refer to the "Links" section for chest pain algorithms and additional  guidance. Performed at [facility], [address], [place], [place] [address]   Urinalysis, w/ Reflex to Culture (Infection Suspected) -Urine, Clean Catch     Status: Abnormal   Collection Time: 07/10/2022  8:05 PM  Result Value Ref Range   Specimen Source URINE, CLEAN CATCH    Color,  Urine YELLOW YELLOW   APPearance HAZY (A) CLEAR   Specific Gravity, Urine >1.046 (H) 1.005 - 1.030   pH 5.0 5.0 - 8.0   Glucose, UA NEGATIVE NEGATIVE mg/dL   Hgb urine dipstick NEGATIVE NEGATIVE  Bilirubin Urine NEGATIVE NEGATIVE   Ketones, ur NEGATIVE NEGATIVE mg/dL   Protein, ur 30 (A) NEGATIVE mg/dL   Nitrite POSITIVE (A) NEGATIVE   Leukocytes,Ua TRACE (A) NEGATIVE   RBC / HPF 0-5 0 - 5 RBC/hpf   WBC, UA 6-10 0 - 5 WBC/hpf    Comment:        Reflex urine culture not performed if WBC <=10, OR if Squamous epithelial cells >5. If Squamous epithelial cells >5 suggest recollection.    Bacteria, UA MANY (A) NONE SEEN   Squamous Epithelial / HPF 11-20 0 - 5 /HPF   Mucus PRESENT    Hyaline Casts, UA PRESENT     Comment: Performed at [facility], [address] [place], [place] [address]   MR Lumbar Spine W Wo Contrast  Result Date: 06/30/2022 CLINICAL DATA:  Initial evaluation for low back pain. History of metastatic disease. EXAM: MRI LUMBAR SPINE WITHOUT AND WITH CONTRAST TECHNIQUE: Multiplanar and multiecho pulse sequences of the lumbar spine were obtained without and with intravenous contrast. CONTRAST:  4m GADAVIST GADOBUTROL 1 MMOL/ML IV SOLN COMPARISON:  Prior CT from earlier the same day. FINDINGS: Segmentation: Standard. Lowest well-formed disc space labeled the L5-S1 level. Alignment: Straightening of the normal lumbar lordosis. Trace degenerative anterolisthesis of L2 on L3. Vertebrae: Multiple osseous metastases seen involving the lumbar spine. These are seen involving the L1, L2, and L5 vertebral bodies. Largest of the lesion seen at L2 and measures 1.9 cm (series 29, image 10). No extra osseous or epidural extension of tumor. No pathologic compression fracture. Conus medullaris and cauda equina: Conus extends to the L1-2 level. Conus and cauda equina appear normal. No abnormal enhancement. Paraspinal and other soft tissues: Multiple scattered soft tissue and mesenteric  metastatic implants seen throughout the visualized external soft tissues in abdomen. Presumed metastatic lesion involving the left kidney noted. Findings are better characterized on prior CT. Disc levels: L1-2: Normal interspace. Mild right-sided facet hypertrophy. No canal or foraminal stenosis. L2-3: Trace anterolisthesis. Disc desiccation with mild disc bulge. Moderate right with mild left facet hypertrophy. No spinal stenosis. Foramina remain adequately patent. L3-4: Disc desiccation with minimal disc bulge. Mild right greater than left facet hypertrophy. No spinal stenosis. Foramina remain adequately patent. L4-5: Disc desiccation with mild disc bulge. Superimposed shallow left subarticular disc protrusion mildly indents the left ventral thecal sac (series 31, image 28). Mild to moderate bilateral facet hypertrophy. No significant canal or lateral recess stenosis. Foramina remain patent. L5-S1:  Unremarkable. IMPRESSION: 1. Multiple osseous metastases involving the lumbar spine as above, largest of which measures 1.9 cm at the L2 vertebral body. No extra osseous or epidural extension of tumor. No pathologic compression fracture. 2. Multiple scattered soft tissue and mesenteric metastatic implants, better characterized on prior CT. 3. Underlying mild for age multilevel degenerative disc disease and facet hypertrophy as above. No significant stenosis or overt neural impingement. Electronically Signed   By: BJeannine BogaM.D.   On: 06/28/2022 19:54   MR THORACIC SPINE W WO CONTRAST  Result Date: 07/06/2022 CLINICAL DATA:  Initial evaluation for mid back pain. History of metastatic disease. EXAM: MRI THORACIC WITHOUT AND WITH CONTRAST TECHNIQUE: Multiplanar and multiecho pulse sequences of the thoracic spine were obtained without and with intravenous contrast. CONTRAST:  76mGADAVIST GADOBUTROL 1 MMOL/ML IV SOLN COMPARISON:  Prior CTs from earlier the same day as well as 07/01/2022. FINDINGS: Alignment:  Physiologic with preservation of the normal thoracic kyphosis. No listhesis. Vertebrae: Osseous metastases involving the  T8 and T9 vertebral bodies are seen. There is diffuse involvement of the T8 and T9 vertebral bodies. Extension into the posterior elements, greater at T9. Abnormal posterior convex bowing of the posterior vertebral margin. Associated extraosseous and epidural extension of tumor into the ventral epidural space at the level of T9. Associated severe spinal stenosis at this level, with the thecal sac measuring 4-5 mm in AP diameter at its most narrow point. Secondary cord flattening with subtle cord signal changes, concerning for edema and/or myelomalacia (series 3, image 10). Early epidural extension into the T9-10 and to a lesser extent the T8-9 neural foramina noted as well. Associated pathologic compression fractures of T8 and T9 noted as well, most pronounced at T9 or height loss measures up to approximately 50%. Additional heterogeneous T1/T2 hyperintense lesion at T5 noted, favored to reflect a heterogeneous hemangioma. Few additional scattered subcentimeter benign hemangiomata noted. No other definite metastatic lesions. Cord: Severe spinal stenosis with cord compression at T9. Subtle cord signal changes concerning for edema and/or myelomalacia. No other abnormal intracanalicular enhancement. Paraspinal and other soft tissues: Multiple scattered soft tissue metastatic implants present throughout the posterior paraspinous soft tissues. Largest of these are seen near the left supraclavicular region and measure up to approximately 4 cm (series 4, image 1). Patient's known right upper lobe mass is partially visualized. Few additional scattered pulmonary nodules noted as well. Metastatic liver lesions partially visualize, better seen on prior CT. Disc levels: Multilevel degenerative spondylosis noted within the visualized cervical spine on counter sequence, but without high-grade spinal stenosis.  No significant disc pathology seen within the thoracic spine itself for patient age. No other spinal stenosis. Foramina remain patent. IMPRESSION: 1. 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 Associated extraosseous and epidural extension of tumor into the ventral epidural space at the level of T9 with resultant severe spinal stenosis and cord compression. Subtle cord signal changes concerning for edema and/or myelomalacia. Emergent neuro surgical and/or radiation oncology consultation recommended. 2. Partial visualization of patient's known right upper lobe mass. Multiple metastatic soft tissue implants with additional hepatic metastases, better evaluated on prior CTs. 3. Heterogeneous lesion involving the T5 vertebral body, favored to reflect a hemangioma. Attention at follow-up recommended. Electronically Signed   By: Jeannine Boga M.D.   On: 07/03/2022 19:44   CT ABDOMEN PELVIS W CONTRAST  Result Date: 07/14/2022 CLINICAL DATA:  Abdominal pain, metastatic cancer not otherwise specified EXAM: CT ABDOMEN AND PELVIS WITH CONTRAST TECHNIQUE: Multidetector CT imaging of the abdomen and pelvis was performed using the standard protocol following bolus administration of intravenous contrast. RADIATION DOSE REDUCTION: This exam was performed according to the departmental dose-optimization program which includes automated exposure control, adjustment of the mA and/or kV according to patient size and/or use of iterative reconstruction technique. CONTRAST:  148m OMNIPAQUE IOHEXOL 300 MG/ML  SOLN COMPARISON:  07/01/2022 FINDINGS: Lower chest: Stable subcentimeter bilateral lower lobe pulmonary nodules consistent with known metastatic disease. Trace pericardial effusion, with soft tissue density in the pericardium anteriorly measuring up to 1.8 cm, concerning for pericardial implant. Pleural base metastatic disease within the left anterior  costophrenic sulcus measures 4.1 x 3.5 cm, previously measuring 3.3 x 2.8 cm. Hepatobiliary: Multiple hepatic hypodensities are identified, consistent with diffuse liver metastases. Largest in the right lobe reference image 11/2 measures 3.5 x 3.1 cm. There are multiple small noncalcified gallstones versus gallbladder polyps. No evidence of acute cholecystitis. No biliary duct dilation. Pancreas: There are multiple indeterminate hypodensities  throughout the pancreatic parenchyma, measuring up to 1.4 cm in the pancreatic head and 1.3 cm in the pancreatic tail. No pancreatic duct dilation. No acute inflammatory change. Spleen: Normal in size without focal abnormality. Adrenals/Urinary Tract: There is a 3.2 x 2.9 cm hypodense mass within the posterior mid left kidney, either primary or metastatic disease. No urinary tract calculi or obstructive uropathy. The adrenals appear grossly normal. Bladder is unremarkable. Stomach/Bowel: There is no bowel obstruction or ileus. There are numerous mesenteric masses which displaced loops of bowel, largest in the right mid abdomen measuring 8.4 x 6.6 cm, consistent with diffuse peritoneal metastases. Vascular/Lymphatic: Atherosclerosis throughout the aorta and its branches. There is no discrete adenopathy, though numerous soft tissue masses are seen throughout the subcutaneous tissues, mesentery, and retroperitoneum, consistent with diffuse metastatic disease. Reproductive: Status post hysterectomy. No adnexal masses. Other: There are multiple soft tissue masses throughout the subcutaneous tissues, mesentery, retroperitoneum, consistent with diffuse metastatic disease. Several index lesions seen on prior chest CT from earlier this month have enlarged, within the upper anterior abdominal wall reference image 19/2 measuring 2.9 x 2.3 cm, and within the left posterolateral chest wall image 1/2 measuring 3.2 x 2.5 cm. Largest mesenteric mass is described above. Largest mass in the  lower pelvis right adnexal region measures 6.3 x 5.0 cm reference image 67/2. Trace pelvic free fluid.  No free intraperitoneal gas. Musculoskeletal: Stable metastatic disease is seen within the T8 and T9 vertebral bodies, with associated pathologic fractures. No retropulsion. Lucent lesions are also seen within the bilateral iliac bones consistent with additional metastatic foci. No new fractures since prior exam. Reconstructed images demonstrate no additional findings. IMPRESSION: 1. Diffuse progressive metastatic disease throughout the lower chest, abdomen, and pelvis. There are new and enlarging soft tissue masses throughout the lungs, pleura, subcutaneous tissues, mesentery, and retroperitoneum as described above. 2. Hypodense lesions throughout the liver, left kidney, and pancreas, consistent with diffuse metastatic disease. 3. Trace pericardial effusion, with suspected pericardial implants seen along the anterior pericardial border. 4. Multiple bony metastases as above, with stable pathologic fractures of the T8 and T9 vertebral bodies. 5. Noncalcified gallstones versus gallbladder polyps. No evidence of acute cholecystitis. 6.  Aortic Atherosclerosis (ICD10-I70.0). Electronically Signed   By: Randa Ngo M.D.   On: 06/25/2022 17:58   DG Chest 2 View  Result Date: 06/30/2022 CLINICAL DATA:  Weakness. EXAM: CHEST - 2 VIEW COMPARISON:  Radiograph of May 25, 2022. CT scan of July 01, 2022. FINDINGS: Normal cardiac size. Lobular right perihilar mass is noted concerning for malignancy. There is associated atelectasis of the right upper lobe. Left lung is clear. Bony thorax is unremarkable. IMPRESSION: Lobular right perihilar mass is noted concerning for neoplasm or malignancy as noted on recent CT scan. There is associated subsegmental atelectasis of the right upper lobe. Electronically Signed   By: Marijo Conception M.D.   On: 07/09/2022 16:32    Pending Labs Unresulted Labs (From admission, onward)      Start     Ordered   07/20/22 [id number]  Basic metabolic panel  Tomorrow morning,   R        07/11/2022 2315   07/20/22 0500  CBC  Tomorrow morning,   R        07/20/2022 2315   07/10/2022 2316  HIV Antibody (routine testing w rflx)  (HIV Antibody (Routine testing w reflex) panel)  Once,   R        06/24/2022 2315  Vitals/Pain Today's Vitals   07/16/2022 2235 07/09/2022 2237 07/04/2022 2245 07/13/2022 2300  BP: 109/83  110/81 122/75  Pulse: (!) 115  (!) 111 (!) 114  Resp: '16  20 20  '$ Temp: 98.4 F (36.9 C)     TempSrc: Oral     SpO2: 94%  95% 94%  Weight:      Height:      PainSc:  5       Isolation Precautions No active isolations  Medications Medications  sodium chloride flush (NS) 0.9 % injection 3 mL (3 mLs Intravenous Not Given 06/26/2022 2316)  sodium chloride flush (NS) 0.9 % injection 3 mL (has no administration in time range)  0.9 %  sodium chloride infusion (has no administration in time range)  acetaminophen (TYLENOL) tablet 650 mg (has no administration in time range)    Or  acetaminophen (TYLENOL) suppository 650 mg (has no administration in time range)  HYDROcodone-acetaminophen (NORCO/VICODIN) 5-325 MG per tablet 1-2 tablet (has no administration in time range)  HYDROmorphone (DILAUDID) injection 0.5-1 mg (has no administration in time range)  methocarbamol (ROBAXIN) 500 mg in dextrose 5 % 50 mL IVPB (has no administration in time range)  docusate sodium (COLACE) capsule 100 mg (has no administration in time range)  polyethylene glycol (MIRALAX / GLYCOLAX) packet 17 g (has no administration in time range)  bisacodyl (DULCOLAX) suppository 10 mg (has no administration in time range)  sodium phosphate (FLEET) 7-19 GM/118ML enema 1 enema (has no administration in time range)  metoprolol tartrate (LOPRESSOR) injection 5 mg (has no administration in time range)  lactated ringers bolus 1,000 mL (1,000 mLs Intravenous New Bag/Given 06/30/2022 1620)  iohexol (OMNIPAQUE) 300  MG/ML solution 100 mL (100 mLs Intravenous Contrast Given 07/20/2022 1730)  gadobutrol (GADAVIST) 1 MMOL/ML injection 7 mL (7 mLs Intravenous Contrast Given 07/11/2022 1849)  HYDROmorphone (DILAUDID) injection 1 mg (1 mg Intravenous Given 07/14/2022 1912)  dexamethasone (DECADRON) injection 10 mg (10 mg Intravenous Given 07/01/2022 2041)  fentaNYL (SUBLIMAZE) injection 50 mcg (50 mcg Intravenous Given 07/03/2022 2138)    Mobility walks with person assist     Focused Assessments Neuro Assessment Handoff:  Swallow screen pass? Yes          Neuro Assessment:   Neuro Checks:      Has TPA been given? No If patient is a Neuro Trauma and patient is going to OR before floor call report to [place] nurse: [phone] or [phone]   R Recommendations: See Admitting Provider Note  Report given to:   Additional Notes: Pt A&Ox4, on room air, pt has on purewick.

## 2022-07-19 NOTE — ED Triage Notes (Signed)
Pt reports she is undergoing chemo for cancer and she has become very weak and is having trouble standing now.

## 2022-07-19 NOTE — ED Notes (Signed)
ED Provider at bedside. 

## 2022-07-19 NOTE — ED Provider Notes (Signed)
  10:37 PM Patient arrives from [facility] ED due to cord compression.  She denies any change in her symptoms from time of transport.  Pain is well controlled.  Will notify neurosurgery that she has arrived.  Discussed with NP Meghan-- has evaluated in the ED, will admit for ongoing care.   Larene Pickett, PA-C 07/19/22 2322    Lacretia Leigh, MD 07/21/22 [phone]

## 2022-07-20 ENCOUNTER — Encounter (HOSPITAL_COMMUNITY): Admission: EM | Disposition: E | Payer: Self-pay | Source: Home / Self Care | Attending: Internal Medicine

## 2022-07-20 ENCOUNTER — Inpatient Hospital Stay (HOSPITAL_COMMUNITY): Payer: Medicaid Other

## 2022-07-20 ENCOUNTER — Encounter (HOSPITAL_COMMUNITY): Payer: Self-pay | Admitting: Neurosurgery

## 2022-07-20 ENCOUNTER — Other Ambulatory Visit: Payer: Self-pay | Admitting: Radiation Therapy

## 2022-07-20 ENCOUNTER — Other Ambulatory Visit: Payer: Self-pay

## 2022-07-20 ENCOUNTER — Inpatient Hospital Stay (HOSPITAL_COMMUNITY): Payer: Medicaid Other | Admitting: Anesthesiology

## 2022-07-20 DIAGNOSIS — C801 Malignant (primary) neoplasm, unspecified: Secondary | ICD-10-CM | POA: Diagnosis not present

## 2022-07-20 DIAGNOSIS — C7949 Secondary malignant neoplasm of other parts of nervous system: Secondary | ICD-10-CM | POA: Diagnosis present

## 2022-07-20 DIAGNOSIS — J449 Chronic obstructive pulmonary disease, unspecified: Secondary | ICD-10-CM | POA: Diagnosis not present

## 2022-07-20 DIAGNOSIS — C7951 Secondary malignant neoplasm of bone: Secondary | ICD-10-CM | POA: Diagnosis not present

## 2022-07-20 DIAGNOSIS — M8458XA Pathological fracture in neoplastic disease, other specified site, initial encounter for fracture: Secondary | ICD-10-CM

## 2022-07-20 DIAGNOSIS — Z87891 Personal history of nicotine dependence: Secondary | ICD-10-CM

## 2022-07-20 HISTORY — PX: LAMINECTOMY: SHX219

## 2022-07-20 LAB — CBC
HCT: 33.4 % — ABNORMAL LOW (ref 36.0–46.0)
Hemoglobin: 10.5 g/dL — ABNORMAL LOW (ref 12.0–15.0)
MCH: 29.7 pg (ref 26.0–34.0)
MCHC: 31.4 g/dL (ref 30.0–36.0)
MCV: 94.4 fL (ref 80.0–100.0)
Platelets: 474 10*3/uL — ABNORMAL HIGH (ref 150–400)
RBC: 3.54 MIL/uL — ABNORMAL LOW (ref 3.87–5.11)
RDW: 17.4 % — ABNORMAL HIGH (ref 11.5–15.5)
WBC: 10.1 10*3/uL (ref 4.0–10.5)
nRBC: 0 % (ref 0.0–0.2)

## 2022-07-20 LAB — TYPE AND SCREEN
ABO/RH(D): AB NEG
Antibody Screen: NEGATIVE

## 2022-07-20 LAB — BASIC METABOLIC PANEL
Anion gap: 9 (ref 5–15)
BUN: 19 mg/dL (ref 6–20)
CO2: 24 mmol/L (ref 22–32)
Calcium: 7.7 mg/dL — ABNORMAL LOW (ref 8.9–10.3)
Chloride: 104 mmol/L (ref 98–111)
Creatinine, Ser: 0.53 mg/dL (ref 0.44–1.00)
GFR, Estimated: >60 mL/min (ref >60)
Glucose, Bld: 144 mg/dL — ABNORMAL HIGH (ref 70–99)
Potassium: 4.5 mmol/L (ref 3.5–5.1)
Sodium: 137 mmol/L (ref 135–145)

## 2022-07-20 LAB — SURGICAL PCR SCREEN
MRSA, PCR: NEGATIVE
Staphylococcus aureus: NEGATIVE

## 2022-07-20 LAB — ABO/RH: ABO/RH(D): AB NEG

## 2022-07-20 LAB — HIV ANTIBODY (ROUTINE TESTING W REFLEX): HIV Screen 4th Generation wRfx: NONREACTIVE

## 2022-07-20 SURGERY — THORACIC LAMINECTOMY FOR TUMOR
Anesthesia: General | Site: Spine Thoracic

## 2022-07-20 MED ORDER — DEXMEDETOMIDINE HCL IN NACL 80 MCG/20ML IV SOLN
INTRAVENOUS | Status: DC | PRN
  Administered 2022-07-20 (×2): 8 ug via BUCCAL
  Administered 2022-07-20: 4 ug via BUCCAL

## 2022-07-20 MED ORDER — FENTANYL CITRATE (PF) 100 MCG/2ML IJ SOLN
INTRAMUSCULAR | Status: AC
  Administered 2022-07-20: 50 ug
  Filled 2022-07-20: qty 2

## 2022-07-20 MED ORDER — 0.9 % SODIUM CHLORIDE (POUR BTL) OPTIME
TOPICAL | Status: DC | PRN
  Administered 2022-07-20: 1000 mL

## 2022-07-20 MED ORDER — SUGAMMADEX SODIUM 200 MG/2ML IV SOLN
INTRAVENOUS | Status: DC | PRN
  Administered 2022-07-20: 200 mg via INTRAVENOUS

## 2022-07-20 MED ORDER — ALBUTEROL SULFATE (2.5 MG/3ML) 0.083% IN NEBU: 2.5000 mg | INHALATION_SOLUTION | Freq: Four times a day (QID) | RESPIRATORY_TRACT | Status: DC | PRN

## 2022-07-20 MED ORDER — FENTANYL CITRATE (PF) 250 MCG/5ML IJ SOLN
INTRAMUSCULAR | Status: DC | PRN
  Administered 2022-07-20: 50 ug via INTRAVENOUS
  Administered 2022-07-20 (×2): 75 ug via INTRAVENOUS
  Administered 2022-07-20: 50 ug via INTRAVENOUS

## 2022-07-20 MED ORDER — BUPIVACAINE HCL (PF) 0.25 % IJ SOLN
INTRAMUSCULAR | Status: DC | PRN
  Administered 2022-07-20: 20 mL

## 2022-07-20 MED ORDER — ACETAMINOPHEN 10 MG/ML IV SOLN
INTRAVENOUS | Status: AC
  Filled 2022-07-20: qty 100

## 2022-07-20 MED ORDER — ONDANSETRON HCL 4 MG/2ML IJ SOLN
INTRAMUSCULAR | Status: DC | PRN
  Administered 2022-07-20: 4 mg via INTRAVENOUS

## 2022-07-20 MED ORDER — CHLORHEXIDINE GLUCONATE 0.12 % MT SOLN: 15.0000 mL | Freq: Once | OROMUCOSAL | Status: AC

## 2022-07-20 MED ORDER — ONDANSETRON HCL 4 MG/2ML IJ SOLN
INTRAMUSCULAR | Status: AC
  Filled 2022-07-20: qty 2

## 2022-07-20 MED ORDER — FLUTICASONE FUROATE-VILANTEROL 100-25 MCG/ACT IN AEPB
1.0000 | INHALATION_SPRAY | Freq: Every day | RESPIRATORY_TRACT | Status: DC
  Administered 2022-07-20 – 2022-07-21 (×2): 1 via RESPIRATORY_TRACT
  Filled 2022-07-20: qty 28

## 2022-07-20 MED ORDER — HYDROMORPHONE HCL 1 MG/ML IJ SOLN
INTRAMUSCULAR | Status: AC
  Filled 2022-07-20: qty 0.5

## 2022-07-20 MED ORDER — THROMBIN 20000 UNITS EX SOLR
CUTANEOUS | Status: AC
  Filled 2022-07-20: qty 20000

## 2022-07-20 MED ORDER — KETOROLAC TROMETHAMINE 15 MG/ML IJ SOLN
15.0000 mg | Freq: Four times a day (QID) | INTRAMUSCULAR | Status: AC
  Administered 2022-07-21 (×4): 15 mg via INTRAVENOUS
  Filled 2022-07-20 (×4): qty 1

## 2022-07-20 MED ORDER — THROMBIN 20000 UNITS EX SOLR: CUTANEOUS | Status: DC | PRN

## 2022-07-20 MED ORDER — ARIPIPRAZOLE 10 MG PO TABS
10.0000 mg | ORAL_TABLET | Freq: Every day | ORAL | Status: DC
  Administered 2022-07-21 – 2022-07-24 (×2): 10 mg via ORAL
  Filled 2022-07-20 (×6): qty 1

## 2022-07-20 MED ORDER — SODIUM CHLORIDE 0.9% FLUSH
3.0000 mL | Freq: Two times a day (BID) | INTRAVENOUS | Status: DC
  Administered 2022-07-20 – 2022-07-21 (×2): 3 mL via INTRAVENOUS

## 2022-07-20 MED ORDER — HYDROCODONE-ACETAMINOPHEN 5-325 MG PO TABS: 1.0000 | ORAL_TABLET | ORAL | Status: DC | PRN

## 2022-07-20 MED ORDER — METOPROLOL TARTRATE 5 MG/5ML IV SOLN
INTRAVENOUS | Status: AC
  Filled 2022-07-20: qty 5

## 2022-07-20 MED ORDER — DILTIAZEM HCL ER COATED BEADS 120 MG PO CP24
120.0000 mg | ORAL_CAPSULE | Freq: Every day | ORAL | Status: DC
  Administered 2022-07-20 – 2022-07-21 (×2): 120 mg via ORAL
  Filled 2022-07-20 (×3): qty 1

## 2022-07-20 MED ORDER — METOPROLOL TARTRATE 5 MG/5ML IV SOLN
2.5000 mg | Freq: Once | INTRAVENOUS | Status: AC
  Administered 2022-07-20: 2.5 mg via INTRAVENOUS

## 2022-07-20 MED ORDER — BACLOFEN 10 MG PO TABS
10.0000 mg | ORAL_TABLET | Freq: Three times a day (TID) | ORAL | Status: DC
  Administered 2022-07-20 – 2022-07-28 (×17): 10 mg via ORAL
  Filled 2022-07-20 (×18): qty 1

## 2022-07-20 MED ORDER — PROPOFOL 10 MG/ML IV BOLUS
INTRAVENOUS | Status: DC | PRN
  Administered 2022-07-20: 140 mg via INTRAVENOUS

## 2022-07-20 MED ORDER — DEXAMETHASONE SODIUM PHOSPHATE 10 MG/ML IJ SOLN
INTRAMUSCULAR | Status: DC | PRN
  Administered 2022-07-20: 10 mg via INTRAVENOUS

## 2022-07-20 MED ORDER — ELUXADOLINE 100 MG PO TABS: 1.0000 | ORAL_TABLET | Freq: Two times a day (BID) | ORAL | Status: DC

## 2022-07-20 MED ORDER — BUPIVACAINE HCL (PF) 0.25 % IJ SOLN
INTRAMUSCULAR | Status: AC
  Filled 2022-07-20: qty 30

## 2022-07-20 MED ORDER — FENTANYL CITRATE PF 50 MCG/ML IJ SOSY
25.0000 ug | PREFILLED_SYRINGE | INTRAMUSCULAR | Status: DC | PRN
  Filled 2022-07-20: qty 1

## 2022-07-20 MED ORDER — CHLORHEXIDINE GLUCONATE 0.12 % MT SOLN
OROMUCOSAL | Status: AC
  Administered 2022-07-20: 15 mL via OROMUCOSAL
  Filled 2022-07-20: qty 15

## 2022-07-20 MED ORDER — DEXMEDETOMIDINE HCL IN NACL 80 MCG/20ML IV SOLN
INTRAVENOUS | Status: AC
  Filled 2022-07-20: qty 40

## 2022-07-20 MED ORDER — ONDANSETRON HCL 4 MG/2ML IJ SOLN
4.0000 mg | Freq: Four times a day (QID) | INTRAMUSCULAR | Status: DC | PRN
  Administered 2022-07-20: 4 mg via INTRAVENOUS

## 2022-07-20 MED ORDER — SODIUM CHLORIDE 0.9% FLUSH: 3.0000 mL | INTRAVENOUS | Status: DC | PRN

## 2022-07-20 MED ORDER — MIDAZOLAM HCL 2 MG/2ML IJ SOLN
INTRAMUSCULAR | Status: AC
  Filled 2022-07-20: qty 2

## 2022-07-20 MED ORDER — FENTANYL CITRATE (PF) 250 MCG/5ML IJ SOLN
INTRAMUSCULAR | Status: AC
  Filled 2022-07-20: qty 5

## 2022-07-20 MED ORDER — LACTATED RINGERS IV SOLN: INTRAVENOUS | Status: DC

## 2022-07-20 MED ORDER — LIDOCAINE 2% (20 MG/ML) 5 ML SYRINGE
INTRAMUSCULAR | Status: DC | PRN
  Administered 2022-07-20: 80 mg via INTRAVENOUS

## 2022-07-20 MED ORDER — METOPROLOL TARTRATE 5 MG/5ML IV SOLN
INTRAVENOUS | Status: DC | PRN
  Administered 2022-07-20: 1 mg via INTRAVENOUS

## 2022-07-20 MED ORDER — CEFAZOLIN SODIUM-DEXTROSE 1-4 GM/50ML-% IV SOLN
1.0000 g | Freq: Three times a day (TID) | INTRAVENOUS | Status: AC
  Administered 2022-07-20 – 2022-07-21 (×2): 1 g via INTRAVENOUS
  Filled 2022-07-20 (×2): qty 50

## 2022-07-20 MED ORDER — ORAL CARE MOUTH RINSE: 15.0000 mL | Freq: Once | OROMUCOSAL | Status: AC

## 2022-07-20 MED ORDER — UMECLIDINIUM BROMIDE 62.5 MCG/ACT IN AEPB
1.0000 | INHALATION_SPRAY | Freq: Every day | RESPIRATORY_TRACT | Status: DC
  Administered 2022-07-20 – 2022-07-21 (×2): 1 via RESPIRATORY_TRACT
  Filled 2022-07-20: qty 7

## 2022-07-20 MED ORDER — PHENOL 1.4 % MT LIQD: 1.0000 | OROMUCOSAL | Status: DC | PRN

## 2022-07-20 MED ORDER — ESMOLOL HCL 100 MG/10ML IV SOLN
INTRAVENOUS | Status: DC | PRN
  Administered 2022-07-20 (×2): 30 mg via INTRAVENOUS

## 2022-07-20 MED ORDER — HYDROMORPHONE HCL 1 MG/ML IJ SOLN
INTRAMUSCULAR | Status: DC | PRN
  Administered 2022-07-20 (×2): .5 mg via INTRAVENOUS

## 2022-07-20 MED ORDER — ACETAMINOPHEN 10 MG/ML IV SOLN
INTRAVENOUS | Status: DC | PRN
  Administered 2022-07-20: 1000 mg via INTRAVENOUS

## 2022-07-20 MED ORDER — CEFAZOLIN SODIUM-DEXTROSE 2-4 GM/100ML-% IV SOLN
2.0000 g | INTRAVENOUS | Status: AC
  Administered 2022-07-20: 2 g via INTRAVENOUS
  Filled 2022-07-20: qty 100

## 2022-07-20 MED ORDER — AMPHETAMINE-DEXTROAMPHET ER 10 MG PO CP24
30.0000 mg | ORAL_CAPSULE | Freq: Every day | ORAL | Status: DC
  Administered 2022-07-20: 30 mg via ORAL
  Filled 2022-07-20: qty 3

## 2022-07-20 MED ORDER — KETAMINE HCL 10 MG/ML IJ SOLN
INTRAMUSCULAR | Status: DC | PRN
  Administered 2022-07-20: 20 mg via INTRAVENOUS
  Administered 2022-07-20: 30 mg via INTRAVENOUS
  Administered 2022-07-20: 20 mg via INTRAVENOUS

## 2022-07-20 MED ORDER — MENTHOL 3 MG MT LOZG: 1.0000 | LOZENGE | OROMUCOSAL | Status: DC | PRN

## 2022-07-20 MED ORDER — ONDANSETRON 4 MG PO TBDP: 4.0000 mg | ORAL_TABLET | Freq: Four times a day (QID) | ORAL | Status: DC | PRN

## 2022-07-20 MED ORDER — ROSUVASTATIN CALCIUM 20 MG PO TABS
20.0000 mg | ORAL_TABLET | Freq: Every evening | ORAL | Status: DC
  Administered 2022-07-21 – 2022-07-27 (×4): 20 mg via ORAL
  Filled 2022-07-20 (×4): qty 1

## 2022-07-20 MED ORDER — PROPOFOL 10 MG/ML IV BOLUS
INTRAVENOUS | Status: AC
  Filled 2022-07-20: qty 20

## 2022-07-20 MED ORDER — SODIUM CHLORIDE 0.9 % IV SOLN
250.0000 mL | INTRAVENOUS | Status: DC
  Administered 2022-07-20: 250 mL via INTRAVENOUS

## 2022-07-20 MED ORDER — ACETAMINOPHEN 650 MG RE SUPP: 650.0000 mg | RECTAL | Status: DC | PRN

## 2022-07-20 MED ORDER — ROCURONIUM BROMIDE 10 MG/ML (PF) SYRINGE
PREFILLED_SYRINGE | INTRAVENOUS | Status: DC | PRN
  Administered 2022-07-20: 60 mg via INTRAVENOUS

## 2022-07-20 MED ORDER — ACETAMINOPHEN 325 MG PO TABS: 650.0000 mg | ORAL_TABLET | ORAL | Status: DC | PRN

## 2022-07-20 MED ORDER — HYDROMORPHONE HCL 1 MG/ML IJ SOLN
1.0000 mg | INTRAMUSCULAR | Status: DC | PRN
  Administered 2022-07-20 – 2022-07-21 (×3): 1 mg via INTRAVENOUS
  Filled 2022-07-20 (×3): qty 1

## 2022-07-20 MED ORDER — KETAMINE HCL 50 MG/5ML IJ SOSY
PREFILLED_SYRINGE | INTRAMUSCULAR | Status: AC
  Filled 2022-07-20: qty 5

## 2022-07-20 MED ORDER — TRAZODONE HCL 50 MG PO TABS
150.0000 mg | ORAL_TABLET | Freq: Every day | ORAL | Status: DC
  Administered 2022-07-20 – 2022-07-21 (×3): 150 mg via ORAL
  Filled 2022-07-20: qty 1
  Filled 2022-07-20: qty 3
  Filled 2022-07-20: qty 1

## 2022-07-20 SURGICAL SUPPLY — 62 items
ADH SKN CLS APL DERMABOND .7 (GAUZE/BANDAGES/DRESSINGS) ×1
APL SKNCLS STERI-STRIP NONHPOA (GAUZE/BANDAGES/DRESSINGS) ×1
BAG COUNTER SPONGE SURGICOUNT (BAG) ×1 IMPLANT
BAG DECANTER FOR FLEXI CONT (MISCELLANEOUS) ×1 IMPLANT
BAG SPNG CNTER NS LX DISP (BAG) ×1
BAND INSRT 18 STRL LF DISP RB (MISCELLANEOUS) ×2
BAND RUBBER #18 3X1/16 STRL (MISCELLANEOUS) ×2 IMPLANT
BENZOIN TINCTURE PRP APPL 2/3 (GAUZE/BANDAGES/DRESSINGS) ×1 IMPLANT
BLADE CLIPPER SURG (BLADE) IMPLANT
BLADE SURG 11 STRL SS (BLADE) IMPLANT
BUR CUTTER 7.0 ROUND (BURR) IMPLANT
BUR MATCHSTICK NEURO 3.0 LAGG (BURR) IMPLANT
CANISTER SUCT 3000ML PPV (MISCELLANEOUS) ×1 IMPLANT
DERMABOND ADVANCED .7 DNX12 (GAUZE/BANDAGES/DRESSINGS) ×1 IMPLANT
DRAPE LAPAROTOMY 100X72X124 (DRAPES) ×1 IMPLANT
DRAPE LAPAROTOMY T 102X78X121 (DRAPES) IMPLANT
DRAPE MICROSCOPE SLANT 54X150 (MISCELLANEOUS) ×1 IMPLANT
DRAPE SURG 17X23 STRL (DRAPES) ×2 IMPLANT
DRSG OPSITE POSTOP 4X6 (GAUZE/BANDAGES/DRESSINGS) IMPLANT
DRSG TEGADERM 4X4.75 (GAUZE/BANDAGES/DRESSINGS) IMPLANT
ELECT REM PT RETURN 9FT ADLT (ELECTROSURGICAL) ×1
ELECTRODE REM PT RTRN 9FT ADLT (ELECTROSURGICAL) ×1 IMPLANT
GAUZE 4X4 16PLY ~~LOC~~+RFID DBL (SPONGE) IMPLANT
GAUZE SPONGE 4X4 12PLY STRL (GAUZE/BANDAGES/DRESSINGS) ×1 IMPLANT
GLOVE BIO SURGEON STRL SZ 6.5 (GLOVE) ×1 IMPLANT
GLOVE BIOGEL PI IND STRL 6.5 (GLOVE) ×1 IMPLANT
GLOVE ECLIPSE 9.0 STRL (GLOVE) ×1 IMPLANT
GOWN STRL REUS W/ TWL LRG LVL3 (GOWN DISPOSABLE) IMPLANT
GOWN STRL REUS W/ TWL XL LVL3 (GOWN DISPOSABLE) IMPLANT
GOWN STRL REUS W/TWL 2XL LVL3 (GOWN DISPOSABLE) IMPLANT
GOWN STRL REUS W/TWL LRG LVL3 (GOWN DISPOSABLE)
GOWN STRL REUS W/TWL XL LVL3 (GOWN DISPOSABLE)
HEMOSTAT SURGICEL 2X14 (HEMOSTASIS) IMPLANT
KIT BASIN OR (CUSTOM PROCEDURE TRAY) ×1 IMPLANT
KIT TURNOVER KIT B (KITS) ×1 IMPLANT
NDL HYPO 25X1 1.5 SAFETY (NEEDLE) ×1 IMPLANT
NDL SPNL 20GX3.5 QUINCKE YW (NEEDLE) IMPLANT
NEEDLE HYPO 22GX1.5 SAFETY (NEEDLE) ×1 IMPLANT
NEEDLE HYPO 25X1 1.5 SAFETY (NEEDLE) ×1 IMPLANT
NEEDLE SPNL 20GX3.5 QUINCKE YW (NEEDLE) IMPLANT
NS IRRIG 1000ML POUR BTL (IV SOLUTION) ×1 IMPLANT
PACK LAMINECTOMY NEURO (CUSTOM PROCEDURE TRAY) ×1 IMPLANT
PATTIES SURGICAL .5 X.5 (GAUZE/BANDAGES/DRESSINGS) IMPLANT
PATTIES SURGICAL .5 X3 (DISPOSABLE) ×1 IMPLANT
SPONGE SURGIFOAM ABS GEL 100 (HEMOSTASIS) IMPLANT
SPONGE T-LAP 4X18 ~~LOC~~+RFID (SPONGE) IMPLANT
STAPLER VISISTAT 35W (STAPLE) ×1 IMPLANT
STRIP CLOSURE SKIN 1/2X4 (GAUZE/BANDAGES/DRESSINGS) ×1 IMPLANT
SUT BONE WAX W31G (SUTURE) IMPLANT
SUT ETHILON 4 0 PS 2 18 (SUTURE) ×1 IMPLANT
SUT NURALON 4 0 TR CR/8 (SUTURE) IMPLANT
SUT PROLENE 6 0 BV (SUTURE) IMPLANT
SUT VIC AB 0 CT1 18XCR BRD8 (SUTURE) IMPLANT
SUT VIC AB 0 CT1 8-18 (SUTURE)
SUT VIC AB 2-0 CT1 18 (SUTURE) ×1 IMPLANT
SUT VIC AB 3-0 SH 8-18 (SUTURE) IMPLANT
SUT VICRYL 4-0 PS2 18IN ABS (SUTURE) IMPLANT
TOWEL GREEN STERILE (TOWEL DISPOSABLE) ×1 IMPLANT
TOWEL GREEN STERILE FF (TOWEL DISPOSABLE) ×1 IMPLANT
TRAY FOLEY MTR SLVR 16FR STAT (SET/KITS/TRAYS/PACK) IMPLANT
TUBE CONNECTING 12X1/4 (SUCTIONS) IMPLANT
WATER STERILE IRR 1000ML POUR (IV SOLUTION) ×1 IMPLANT

## 2022-07-20 NOTE — Anesthesia Postprocedure Evaluation (Signed)
Anesthesia Post Note  Patient: Aliya Klopfenstein Flye  Procedure(s) Performed: THORACIC EIGHT-THORACIC NINE LAMINECTOMY FOR RESECTION OF TUMOR (Spine Thoracic)     Patient location during evaluation: PACU Anesthesia Type: General Level of consciousness: awake and alert Pain management: pain level controlled Vital Signs Assessment: post-procedure vital signs reviewed and stable Respiratory status: spontaneous breathing, nonlabored ventilation, respiratory function stable and patient connected to nasal cannula oxygen Cardiovascular status: blood pressure returned to baseline and stable Postop Assessment: no apparent nausea or vomiting Anesthetic complications: no   No notable events documented.  Last Vitals:  Vitals:   07/20/22 2115 07/20/22 2119  BP: 114/80 95/77  Pulse: (!) 118 (!) 116  Resp: 17 14  Temp: 36.7 C 36.5 C  SpO2: 94% 95%    Last Pain:  Vitals:   07/20/22 2119  TempSrc: Oral  PainSc:     LLE Motor Response: Purposeful movement (07/20/22 2115) LLE Sensation: Full sensation (07/20/22 2115) RLE Motor Response: Purposeful movement (07/20/22 2115) RLE Sensation: Pain (07/20/22 2115)      Santa Lighter

## 2022-07-20 NOTE — Op Note (Signed)
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  Asst. Surgeon: Reinaldo Meeker, NP  Anesthesia: General  Indication: 62 year old female with history of metastatic melanoma under immunotherapy treatment.  Patient with back pain for greater than 6 months.  Known to have mild compression deformities of T8 and T9 which been stable over time.  Patient has not received radiation treatment however.  She now presents with worsening weakness.  Workup demonstrates evidence of progressive of ventral epidural tumor with cord compression at T8-T9.  Patient with mild weakness in both lower extremities and some gait instability and some pain and numbness.  Patient presents now for decompression and resection of tumor in hopes of improving her symptoms.  Operative note: After induction of anesthesia, patient positioned prone onto bolsters and appropriately padded.  Thoracic region prepped and draped sterilely.  Localizing x-ray was taken.  Incision made over T8 and T9.  Dissection performed bilaterally.  Retractor placed.  X-ray taken.  The T10 pedicle was identified.  Decompressive laminectomy was then performed using Leksell rongeurs, high-speed drill and Kerrison rongeurs to remove the entire lamina of T8 and T9 and the medial aspect of the T8-9 and T7-8 facet joints.  Ligamentum flavum elevated and resected.  Underlying thecal sac and exiting T8 and T9 nerve roots identified.  Microscope for field used microdissection.  Working laterally along the thecal sac epidural tumor was present predominantly around the levels of the pedicles of T8 and T9 but somewhat behind the body of T8 as well.  Tumor was  dissected free using nerve hooks and removed using pituitary micro rongeurs.  Care was taken to not cause undue compression of the thecal sac or spinal cord.  After a good debulking of the epidural tumor had been achieved.  The wound was irrigated.  Hemostasis was achieved with Gelfoam.  Wound is irrigated 1 final time.  Hemovac drain was left in the deep wound space.  Wound is then closed in layers with Vicryl sutures.  Steri-Strips and sterile dressing were applied.  No apparent complications.  Patient tolerated the procedure well and she returns to the recovery room postop.

## 2022-07-20 NOTE — ED Notes (Signed)
ED TO INPATIENT HANDOFF REPORT  ED Nurse Name and Phone #: Sonia Baller [phone]  S Name/Age/Gender Theresa Pace 62 y.o. female Room/Bed: 007C/007C  Code Status   Code Status: Full Code  Home/SNF/Other Home Patient oriented to: self, place, time, and situation Is this baseline? Yes   Triage Complete: Triage complete  Chief Complaint Thoracic spinal stenosis [M48.04]  Triage Note Pt reports she is undergoing chemo for cancer and she has become very weak and is having trouble standing now.   Allergies Allergies  Allergen Reactions   Methadone Hives   Aspirin Other (See Comments)    Stomach Pain    Level of Care/Admitting Diagnosis ED Disposition     ED Disposition  Admit   Condition  --   Comment  Hospital Area: [facility] [[id number]]  Level of Care: Progressive [102]  Admit to Progressive based on following criteria: NEUROLOGICAL AND NEUROSURGICAL complex patients with significant risk of instability, who do not meet ICU criteria, yet require close observation or frequent assessment (< / = every 2 - 4 hours) with medical / nursing intervention.  May admit patient to [facility] or [facility] if equivalent level of care is available:: No  Covid Evaluation: Asymptomatic - no recent exposure (last 10 days) testing not required  Diagnosis: Thoracic spinal stenosis [id number]  Admitting Physician: [place]  Attending Physician: Earnie Larsson [[id number]]  Bed request comments: 4NP  Certification:: I certify this patient will need inpatient services for at least 2 midnights  Estimated Length of Stay: 3          B Medical/Surgery History Past Medical History:  Diagnosis Date   Arthritis    Asthma    Cancer ([place])    COPD (chronic obstructive pulmonary disease) ([place])    Depression    patient states she is no longer depressed   Fibromyalgia    Frequent urination at night    GERD (gastroesophageal reflux disease)    Hypercholesteremia    IBS  (irritable bowel syndrome)    Tachycardia    treated with Cardizem   Tendonitis    Past Surgical History:  Procedure Laterality Date   ABDOMINAL HYSTERECTOMY     APPENDECTOMY     BIOPSY  05/31/2021   Procedure: BIOPSY;  Surgeon: Daneil Dolin, MD;  [facility];  Service: Endoscopy;;   COLONOSCOPY  05/2014   2 sessile polyps, 3-7 mm in size (Tubular adenomas) and internal hemorrhoids. diverticulosis   COLONOSCOPY WITH PROPOFOL N/A 05/31/2021   three 4-6 mm polyps, segmental biopsies of colon, normal TI. Tubular adenomas and hyperplastic. Negative colonic biopsies for microscopic colitis. Surveillance in 5 years.   KNEE ARTHROPLASTY Right 05/23/2017   POLYPECTOMY  05/31/2021   Procedure: POLYPECTOMY;  Surgeon: Daneil Dolin, MD;  [facility];  Service: Endoscopy;;   skin cancer removal from back       A IV Location/Drains/Wounds Patient Lines/Drains/Airways Status     Active Line/Drains/Airways     Name Placement date Placement time Site Days   Peripheral IV 07/13/2022 22 G Anterior;Left Forearm 06/26/2022  2136  Forearm  1            Intake/Output Last 24 hours No intake or output data in the 24 hours ending 07/20/22 1321  Labs/Imaging Results for orders placed or performed during the hospital encounter of 07/14/2022 (from the past 48 hour(s))  Comprehensive metabolic panel     Status: Abnormal   Collection Time: 07/17/2022  3:55 PM  Result Value Ref Range   Sodium 136 135 - 145 mmol/L   Potassium 3.6 3.5 - 5.1 mmol/L   Chloride 101 98 - 111 mmol/L   CO2 24 22 - 32 mmol/L   Glucose, Bld 123 (H) 70 - 99 mg/dL    Comment: Glucose reference range applies only to samples taken after fasting for at least 8 hours.   BUN 23 (H) 6 - 20 mg/dL   Creatinine, Ser 0.54 0.44 - 1.00 mg/dL   Calcium 7.6 (L) 8.9 - 10.3 mg/dL   Total Protein 5.8 (L) 6.5 - 8.1 g/dL   Albumin 2.1 (L) 3.5 - 5.0 g/dL   AST 17 15 - 41 U/L   ALT 13 0 - 44 U/L   Alkaline Phosphatase  95 38 - 126 U/L   Total Bilirubin 0.5 0.3 - 1.2 mg/dL   GFR, Estimated >60 >60 mL/min    Comment: (NOTE) Calculated using the CKD-EPI Creatinine Equation (2021)    Anion gap 11 5 - 15    Comment: Performed at [facility], [address], [place], [place] [address]  Troponin I (High Sensitivity)     Status: None   Collection Time: 06/25/2022  3:55 PM  Result Value Ref Range   Troponin I (High Sensitivity) 7 <18 ng/L    Comment: (NOTE) Elevated high sensitivity troponin I (hsTnI) values and significant  changes across serial measurements may suggest ACS but many other  chronic and acute conditions are known to elevate hsTnI results.  Refer to the "Links" section for chest pain algorithms and additional  guidance. Performed at [facility], [address], [place], [place] [address]   CBC with Differential     Status: Abnormal   Collection Time: 07/09/2022  3:55 PM  Result Value Ref Range   WBC 11.1 (H) 4.0 - 10.5 K/uL   RBC 3.69 (L) 3.87 - 5.11 MIL/uL   Hemoglobin 11.1 (L) 12.0 - 15.0 g/dL   HCT 34.7 (L) 36.0 - 46.0 %   MCV 94.0 80.0 - 100.0 fL   MCH 30.1 26.0 - 34.0 pg   MCHC 32.0 30.0 - 36.0 g/dL   RDW 17.4 (H) 11.5 - 15.5 %   Platelets 535 (H) 150 - 400 K/uL   nRBC 0.0 0.0 - 0.2 %   Neutrophils Relative % 77 %   Neutro Abs 8.6 (H) 1.7 - 7.7 K/uL   Lymphocytes Relative 13 %   Lymphs Abs 1.5 0.7 - 4.0 K/uL   Monocytes Relative 7 %   Monocytes Absolute 0.8 0.1 - 1.0 K/uL   Eosinophils Relative 1 %   Eosinophils Absolute 0.1 0.0 - 0.5 K/uL   Basophils Relative 1 %   Basophils Absolute 0.1 0.0 - 0.1 K/uL   Immature Granulocytes 1 %   Abs Immature Granulocytes 0.07 0.00 - 0.07 K/uL    Comment: Performed at [facility], [address] [place], [place] [address]  Resp panel by RT-PCR (RSV, Flu A&B, Covid) Anterior Nasal Swab     Status: None   Collection Time: 06/27/2022  5:15 PM   Specimen: Anterior Nasal Swab  Result Value Ref Range   SARS Coronavirus 2 by RT PCR NEGATIVE  NEGATIVE    Comment: (NOTE) SARS-CoV-2 target nucleic acids are NOT DETECTED.  The SARS-CoV-2 RNA is generally detectable in upper respiratory specimens during the acute phase of infection. The lowest concentration of SARS-CoV-2 viral copies this assay can detect is 138 copies/mL. A negative result does not  preclude SARS-Cov-2 infection and should not be used as the sole basis for treatment or other patient management decisions. A negative result may occur with  improper specimen collection/handling, submission of specimen other than nasopharyngeal swab, presence of viral mutation(s) within the areas targeted by this assay, and inadequate number of viral copies(<138 copies/mL). A negative result must be combined with clinical observations, patient history, and epidemiological information. The expected result is Negative.  Fact Sheet for Patients:  EntrepreneurPulse.com.au  Fact Sheet for Healthcare Providers:  IncredibleEmployment.be  This test is no t yet approved or cleared by the [place] FDA and  has been authorized for detection and/or diagnosis of SARS-CoV-2 by FDA under an Emergency Use Authorization (EUA). This EUA will remain  in effect (meaning this test can be used) for the duration of the COVID-19 declaration under Section 564(b)(1) of the Act, 21 U.S.C.section 360bbb-3(b)(1), unless the authorization is terminated  or revoked sooner.       Influenza A by PCR NEGATIVE NEGATIVE   Influenza B by PCR NEGATIVE NEGATIVE    Comment: (NOTE) The Xpert Xpress SARS-CoV-2/FLU/RSV plus assay is intended as an aid in the diagnosis of influenza from Nasopharyngeal swab specimens and should not be used as a sole basis for treatment. Nasal washings and aspirates are unacceptable for Xpert Xpress SARS-CoV-2/FLU/RSV testing.  Fact Sheet for Patients: EntrepreneurPulse.com.au  Fact Sheet for Healthcare  Providers: IncredibleEmployment.be  This test is not yet approved or cleared by the [place] FDA and has been authorized for detection and/or diagnosis of SARS-CoV-2 by FDA under an Emergency Use Authorization (EUA). This EUA will remain in effect (meaning this test can be used) for the duration of the COVID-19 declaration under Section 564(b)(1) of the Act, 21 U.S.C. section 360bbb-3(b)(1), unless the authorization is terminated or revoked.     Resp Syncytial Virus by PCR NEGATIVE NEGATIVE    Comment: (NOTE) Fact Sheet for Patients: EntrepreneurPulse.com.au  Fact Sheet for Healthcare Providers: IncredibleEmployment.be  This test is not yet approved or cleared by the [place] FDA and has been authorized for detection and/or diagnosis of SARS-CoV-2 by FDA under an Emergency Use Authorization (EUA). This EUA will remain in effect (meaning this test can be used) for the duration of the COVID-19 declaration under Section 564(b)(1) of the Act, 21 U.S.C. section 360bbb-3(b)(1), unless the authorization is terminated or revoked.  Performed at [facility], [address], [place], [place] [address]   Troponin I (High Sensitivity)     Status: None   Collection Time: 06/23/2022  5:15 PM  Result Value Ref Range   Troponin I (High Sensitivity) 8 <18 ng/L    Comment: (NOTE) Elevated high sensitivity troponin I (hsTnI) values and significant  changes across serial measurements may suggest ACS but many other  chronic and acute conditions are known to elevate hsTnI results.  Refer to the "Links" section for chest pain algorithms and additional  guidance. Performed at [facility], [address] [place],  [address]   Urinalysis, w/ Reflex to Culture (Infection Suspected) -Urine, Clean Catch     Status: Abnormal   Collection Time: 06/27/2022  8:05 PM  Result Value Ref Range   Specimen Source URINE, CLEAN CATCH    Color,  Urine YELLOW YELLOW   APPearance HAZY (A) CLEAR   Specific Gravity, Urine >1.046 (H) 1.005 - 1.030   pH 5.0 5.0 - 8.0   Glucose, UA NEGATIVE NEGATIVE mg/dL   Hgb urine dipstick NEGATIVE NEGATIVE   Bilirubin Urine NEGATIVE  NEGATIVE   Ketones, ur NEGATIVE NEGATIVE mg/dL   Protein, ur 30 (A) NEGATIVE mg/dL   Nitrite POSITIVE (A) NEGATIVE   Leukocytes,Ua TRACE (A) NEGATIVE   RBC / HPF 0-5 0 - 5 RBC/hpf   WBC, UA 6-10 0 - 5 WBC/hpf    Comment:        Reflex urine culture not performed if WBC <=10, OR if Squamous epithelial cells >5. If Squamous epithelial cells >5 suggest recollection.    Bacteria, UA MANY (A) NONE SEEN   Squamous Epithelial / HPF 11-20 0 - 5 /HPF   Mucus PRESENT    Hyaline Casts, UA PRESENT     Comment: Performed at [facility], [address], [place], [place] [address]  HIV Antibody (routine testing w rflx)     Status: None   Collection Time: 07/20/22 12:55 AM  Result Value Ref Range   HIV Screen 4th Generation wRfx Non Reactive Non Reactive    Comment: Performed at [facility], [place] [address], [place], [place] [id number]  Basic metabolic panel     Status: Abnormal   Collection Time: 07/20/22 12:55 AM  Result Value Ref Range   Sodium 137 135 - 145 mmol/L   Potassium 4.5 3.5 - 5.1 mmol/L    Comment: HEMOLYSIS AT THIS LEVEL MAY AFFECT RESULT DELTA CHECK NOTED    Chloride 104 98 - 111 mmol/L   CO2 24 22 - 32 mmol/L   Glucose, Bld 144 (H) 70 - 99 mg/dL    Comment: Glucose reference range applies only to samples taken after fasting for at least 8 hours.   BUN 19 6 - 20 mg/dL   Creatinine, Ser 0.53 0.44 - 1.00 mg/dL   Calcium 7.7 (L) 8.9 - 10.3 mg/dL   GFR, Estimated >60 >60 mL/min    Comment: (NOTE) Calculated using the CKD-EPI Creatinine Equation (2021)    Anion gap 9 5 - 15    Comment: Performed at [facility] [address] [place], [place] [address]  CBC     Status: Abnormal   Collection Time: 07/20/22 12:55 AM  Result Value Ref Range    WBC 10.1 4.0 - 10.5 K/uL   RBC 3.54 (L) 3.87 - 5.11 MIL/uL   Hemoglobin 10.5 (L) 12.0 - 15.0 g/dL   HCT 33.4 (L) 36.0 - 46.0 %   MCV 94.4 80.0 - 100.0 fL   MCH 29.7 26.0 - 34.0 pg   MCHC 31.4 30.0 - 36.0 g/dL   RDW 17.4 (H) 11.5 - 15.5 %   Platelets 474 (H) 150 - 400 K/uL   nRBC 0.0 0.0 - 0.2 %    Comment: Performed at [facility], [place] [address] [place], [place] [address]   MR Lumbar Spine W Wo Contrast  Result Date: 07/08/2022 CLINICAL DATA:  Initial evaluation for low back pain. History of metastatic disease. EXAM: MRI LUMBAR SPINE WITHOUT AND WITH CONTRAST TECHNIQUE: Multiplanar and multiecho pulse sequences of the lumbar spine were obtained without and with intravenous contrast. CONTRAST:  52m GADAVIST GADOBUTROL 1 MMOL/ML IV SOLN COMPARISON:  Prior CT from earlier the same day. FINDINGS: Segmentation: Standard. Lowest well-formed disc space labeled the L5-S1 level. Alignment: Straightening of the normal lumbar lordosis. Trace degenerative anterolisthesis of L2 on L3. Vertebrae: Multiple osseous metastases seen involving the lumbar spine. These are seen involving the L1, L2, and L5 vertebral bodies. Largest of the lesion seen at L2 and measures 1.9 cm (series 29, image 10). No  extra osseous or epidural extension of tumor. No pathologic compression fracture. Conus medullaris and cauda equina: Conus extends to the L1-2 level. Conus and cauda equina appear normal. No abnormal enhancement. Paraspinal and other soft tissues: Multiple scattered soft tissue and mesenteric metastatic implants seen throughout the visualized external soft tissues in abdomen. Presumed metastatic lesion involving the left kidney noted. Findings are better characterized on prior CT. Disc levels: L1-2: Normal interspace. Mild right-sided facet hypertrophy. No canal or foraminal stenosis. L2-3: Trace anterolisthesis. Disc desiccation with mild disc bulge. Moderate right with mild left facet hypertrophy. No spinal  stenosis. Foramina remain adequately patent. L3-4: Disc desiccation with minimal disc bulge. Mild right greater than left facet hypertrophy. No spinal stenosis. Foramina remain adequately patent. L4-5: Disc desiccation with mild disc bulge. Superimposed shallow left subarticular disc protrusion mildly indents the left ventral thecal sac (series 31, image 28). Mild to moderate bilateral facet hypertrophy. No significant canal or lateral recess stenosis. Foramina remain patent. L5-S1:  Unremarkable. IMPRESSION: 1. Multiple osseous metastases involving the lumbar spine as above, largest of which measures 1.9 cm at the L2 vertebral body. No extra osseous or epidural extension of tumor. No pathologic compression fracture. 2. Multiple scattered soft tissue and mesenteric metastatic implants, better characterized on prior CT. 3. Underlying mild for age multilevel degenerative disc disease and facet hypertrophy as above. No significant stenosis or overt neural impingement. Electronically Signed   By: Jeannine Boga M.D.   On: 07/02/2022 19:54   MR THORACIC SPINE W WO CONTRAST  Result Date: 07/12/2022 CLINICAL DATA:  Initial evaluation for mid back pain. History of metastatic disease. EXAM: MRI THORACIC WITHOUT AND WITH CONTRAST TECHNIQUE: Multiplanar and multiecho pulse sequences of the thoracic spine were obtained without and with intravenous contrast. CONTRAST:  66m GADAVIST GADOBUTROL 1 MMOL/ML IV SOLN COMPARISON:  Prior CTs from earlier the same day as well as 07/01/2022. FINDINGS: Alignment: Physiologic with preservation of the normal thoracic kyphosis. No listhesis. Vertebrae: Osseous metastases involving the T8 and T9 vertebral bodies are seen. There is diffuse involvement of the T8 and T9 vertebral bodies. Extension into the posterior elements, greater at T9. Abnormal posterior convex bowing of the posterior vertebral margin. Associated extraosseous and epidural extension of tumor into the ventral epidural  space at the level of T9. Associated severe spinal stenosis at this level, with the thecal sac measuring 4-5 mm in AP diameter at its most narrow point. Secondary cord flattening with subtle cord signal changes, concerning for edema and/or myelomalacia (series 3, image 10). Early epidural extension into the T9-10 and to a lesser extent the T8-9 neural foramina noted as well. Associated pathologic compression fractures of T8 and T9 noted as well, most pronounced at T9 or height loss measures up to approximately 50%. Additional heterogeneous T1/T2 hyperintense lesion at T5 noted, favored to reflect a heterogeneous hemangioma. Few additional scattered subcentimeter benign hemangiomata noted. No other definite metastatic lesions. Cord: Severe spinal stenosis with cord compression at T9. Subtle cord signal changes concerning for edema and/or myelomalacia. No other abnormal intracanalicular enhancement. Paraspinal and other soft tissues: Multiple scattered soft tissue metastatic implants present throughout the posterior paraspinous soft tissues. Largest of these are seen near the left supraclavicular region and measure up to approximately 4 cm (series 4, image 1). Patient's known right upper lobe mass is partially visualized. Few additional scattered pulmonary nodules noted as well. Metastatic liver lesions partially visualize, better seen on prior CT. Disc levels: Multilevel degenerative spondylosis noted within the visualized cervical spine  on counter sequence, but without high-grade spinal stenosis. No significant disc pathology seen within the thoracic spine itself for patient age. No other spinal stenosis. Foramina remain patent. IMPRESSION: 1. 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 Associated extraosseous and epidural extension of tumor into the ventral epidural space at the level of T9 with resultant severe spinal stenosis and cord compression.  Subtle cord signal changes concerning for edema and/or myelomalacia. Emergent neuro surgical and/or radiation oncology consultation recommended. 2. Partial visualization of patient's known right upper lobe mass. Multiple metastatic soft tissue implants with additional hepatic metastases, better evaluated on prior CTs. 3. Heterogeneous lesion involving the T5 vertebral body, favored to reflect a hemangioma. Attention at follow-up recommended. Electronically Signed   By: Jeannine Boga M.D.   On: 07/02/2022 19:44   CT ABDOMEN PELVIS W CONTRAST  Result Date: 07/04/2022 CLINICAL DATA:  Abdominal pain, metastatic cancer not otherwise specified EXAM: CT ABDOMEN AND PELVIS WITH CONTRAST TECHNIQUE: Multidetector CT imaging of the abdomen and pelvis was performed using the standard protocol following bolus administration of intravenous contrast. RADIATION DOSE REDUCTION: This exam was performed according to the departmental dose-optimization program which includes automated exposure control, adjustment of the mA and/or kV according to patient size and/or use of iterative reconstruction technique. CONTRAST:  114m OMNIPAQUE IOHEXOL 300 MG/ML  SOLN COMPARISON:  07/01/2022 FINDINGS: Lower chest: Stable subcentimeter bilateral lower lobe pulmonary nodules consistent with known metastatic disease. Trace pericardial effusion, with soft tissue density in the pericardium anteriorly measuring up to 1.8 cm, concerning for pericardial implant. Pleural base metastatic disease within the left anterior costophrenic sulcus measures 4.1 x 3.5 cm, previously measuring 3.3 x 2.8 cm. Hepatobiliary: Multiple hepatic hypodensities are identified, consistent with diffuse liver metastases. Largest in the right lobe reference image 11/2 measures 3.5 x 3.1 cm. There are multiple small noncalcified gallstones versus gallbladder polyps. No evidence of acute cholecystitis. No biliary duct dilation. Pancreas: There are multiple indeterminate  hypodensities throughout the pancreatic parenchyma, measuring up to 1.4 cm in the pancreatic head and 1.3 cm in the pancreatic tail. No pancreatic duct dilation. No acute inflammatory change. Spleen: Normal in size without focal abnormality. Adrenals/Urinary Tract: There is a 3.2 x 2.9 cm hypodense mass within the posterior mid left kidney, either primary or metastatic disease. No urinary tract calculi or obstructive uropathy. The adrenals appear grossly normal. Bladder is unremarkable. Stomach/Bowel: There is no bowel obstruction or ileus. There are numerous mesenteric masses which displaced loops of bowel, largest in the right mid abdomen measuring 8.4 x 6.6 cm, consistent with diffuse peritoneal metastases. Vascular/Lymphatic: Atherosclerosis throughout the aorta and its branches. There is no discrete adenopathy, though numerous soft tissue masses are seen throughout the subcutaneous tissues, mesentery, and retroperitoneum, consistent with diffuse metastatic disease. Reproductive: Status post hysterectomy. No adnexal masses. Other: There are multiple soft tissue masses throughout the subcutaneous tissues, mesentery, retroperitoneum, consistent with diffuse metastatic disease. Several index lesions seen on prior chest CT from earlier this month have enlarged, within the upper anterior abdominal wall reference image 19/2 measuring 2.9 x 2.3 cm, and within the left posterolateral chest wall image 1/2 measuring 3.2 x 2.5 cm. Largest mesenteric mass is described above. Largest mass in the lower pelvis right adnexal region measures 6.3 x 5.0 cm reference image 67/2. Trace pelvic free fluid.  No free intraperitoneal gas. Musculoskeletal: Stable metastatic disease is seen within the T8 and T9 vertebral bodies, with associated pathologic fractures. No retropulsion. Lucent lesions  are also seen within the bilateral iliac bones consistent with additional metastatic foci. No new fractures since prior exam. Reconstructed  images demonstrate no additional findings. IMPRESSION: 1. Diffuse progressive metastatic disease throughout the lower chest, abdomen, and pelvis. There are new and enlarging soft tissue masses throughout the lungs, pleura, subcutaneous tissues, mesentery, and retroperitoneum as described above. 2. Hypodense lesions throughout the liver, left kidney, and pancreas, consistent with diffuse metastatic disease. 3. Trace pericardial effusion, with suspected pericardial implants seen along the anterior pericardial border. 4. Multiple bony metastases as above, with stable pathologic fractures of the T8 and T9 vertebral bodies. 5. Noncalcified gallstones versus gallbladder polyps. No evidence of acute cholecystitis. 6.  Aortic Atherosclerosis (ICD10-I70.0). Electronically Signed   By: Randa Ngo M.D.   On: 06/23/2022 17:58   DG Chest 2 View  Result Date: 07/05/2022 CLINICAL DATA:  Weakness. EXAM: CHEST - 2 VIEW COMPARISON:  Radiograph of May 25, 2022. CT scan of July 01, 2022. FINDINGS: Normal cardiac size. Lobular right perihilar mass is noted concerning for malignancy. There is associated atelectasis of the right upper lobe. Left lung is clear. Bony thorax is unremarkable. IMPRESSION: Lobular right perihilar mass is noted concerning for neoplasm or malignancy as noted on recent CT scan. There is associated subsegmental atelectasis of the right upper lobe. Electronically Signed   By: Marijo Conception M.D.   On: 07/06/2022 16:32    Pending Labs Unresulted Labs (From admission, onward)    None       Vitals/Pain Today's Vitals   07/20/22 0830 07/20/22 0854 07/20/22 0930 07/20/22 1030  BP: 125/84  111/85   Pulse: (!) 106  (!) 106 (!) 119  Resp: '13  13 17  '$ Temp: 98.4 F (36.9 C)   98.5 F (36.9 C)  TempSrc: Oral   Oral  SpO2: 94%  94% 93%  Weight: 68 kg   68 kg  Height: '5\' 2"'$  (1.575 m)   '5\' 2"'$  (1.575 m)  PainSc:  4       Isolation Precautions No active  isolations  Medications Medications  sodium chloride flush (NS) 0.9 % injection 3 mL (3 mLs Intravenous Not Given 07/14/2022 2316)  sodium chloride flush (NS) 0.9 % injection 3 mL (has no administration in time range)  0.9 %  sodium chloride infusion (has no administration in time range)  acetaminophen (TYLENOL) tablet 650 mg (has no administration in time range)    Or  acetaminophen (TYLENOL) suppository 650 mg (has no administration in time range)  HYDROcodone-acetaminophen (NORCO/VICODIN) 5-325 MG per tablet 1-2 tablet (2 tablets Oral Given 07/20/22 0536)  HYDROmorphone (DILAUDID) injection 0.5-1 mg (1 mg Intravenous Given 07/20/22 1126)  methocarbamol (ROBAXIN) 500 mg in dextrose 5 % 50 mL IVPB (has no administration in time range)  docusate sodium (COLACE) capsule 100 mg (100 mg Oral Not Given 07/20/22 0824)  polyethylene glycol (MIRALAX / GLYCOLAX) packet 17 g (has no administration in time range)  bisacodyl (DULCOLAX) suppository 10 mg (has no administration in time range)  sodium phosphate (FLEET) 7-19 GM/118ML enema 1 enema (has no administration in time range)  metoprolol tartrate (LOPRESSOR) injection 5 mg (has no administration in time range)  dexamethasone (DECADRON) injection 4 mg (4 mg Intravenous Given 07/20/22 1054)  diltiazem (CARDIZEM CD) 24 hr capsule 120 mg (120 mg Oral Given 07/20/22 0824)  rosuvastatin (CRESTOR) tablet 20 mg (has no administration in time range)  traZODone (DESYREL) tablet 150 mg (150 mg Oral Given 07/20/22 0014)  ARIPiprazole (ABILIFY) tablet  10 mg (0 mg Oral Hold 07/20/22 0824)  amphetamine-dextroamphetamine (ADDERALL XR) 24 hr capsule 30 mg (30 mg Oral Given 07/20/22 1054)  baclofen (LIORESAL) tablet 10 mg (10 mg Oral Given 07/20/22 1054)  albuterol (PROVENTIL) (2.5 MG/3ML) 0.083% nebulizer solution 2.5 mg (has no administration in time range)  ondansetron (ZOFRAN) injection 4 mg (has no administration in time range)    Or  ondansetron (ZOFRAN-ODT)  disintegrating tablet 4 mg (has no administration in time range)  fluticasone furoate-vilanterol (BREO ELLIPTA) 100-25 MCG/ACT 1 puff (1 puff Inhalation Given 07/20/22 0826)    And  umeclidinium bromide (INCRUSE ELLIPTA) 62.5 MCG/ACT 1 puff (1 puff Inhalation Given 07/20/22 0826)  ceFAZolin (ANCEF) IVPB 2g/100 mL premix (has no administration in time range)  lactated ringers bolus 1,000 mL (0 mLs Intravenous Stopped 07/20/2022 2344)  iohexol (OMNIPAQUE) 300 MG/ML solution 100 mL (100 mLs Intravenous Contrast Given 07/18/2022 1730)  gadobutrol (GADAVIST) 1 MMOL/ML injection 7 mL (7 mLs Intravenous Contrast Given 06/25/2022 1849)  HYDROmorphone (DILAUDID) injection 1 mg (1 mg Intravenous Given 07/06/2022 1912)  dexamethasone (DECADRON) injection 10 mg (10 mg Intravenous Given 07/12/2022 2041)  fentaNYL (SUBLIMAZE) injection 50 mcg (50 mcg Intravenous Given 07/17/2022 2138)    Mobility walks     Focused Assessments Neuro    R Recommendations: See Admitting Provider Note  Report given to:

## 2022-07-20 NOTE — Brief Op Note (Signed)
07/20/2022 - 07/20/2022  7:47 PM  PATIENT:  Theresa Pace  62 y.o. female  PRE-OPERATIVE DIAGNOSIS:  Thoracic epidural mass  POST-OPERATIVE DIAGNOSIS:  Thoracic epidural mass  PROCEDURE:  Procedure(s) with comments: THORACIC EIGHT-THORACIC NINE LAMINECTOMY FOR RESECTION OF TUMOR (N/A) - T8-T9, available at 4 PM  SURGEON:  Surgeon(s) and Role:    * Earnie Larsson, MD - Primary  PHYSICIAN ASSISTANT:   ASSISTANTSMearl Latin   ANESTHESIA:   general  EBL:  100 mL   BLOOD ADMINISTERED:none  DRAINS: (med) Hemovact drain(s) in the deep wound space with  Suction Open   LOCAL MEDICATIONS USED:  MARCAINE     SPECIMEN: T8-T9 epidural space  DISPOSITION OF SPECIMEN:  PATHOLOGY  COUNTS:  YES  TOURNIQUET:  * No tourniquets in log *  DICTATION: .Dragon Dictation  PLAN OF CARE: Admit to inpatient   PATIENT DISPOSITION:  PACU - hemodynamically stable.   Delay start of Pharmacological VTE agent (>24hrs) due to surgical blood loss or risk of bleeding: yes

## 2022-07-20 NOTE — Transfer of Care (Signed)
Immediate Anesthesia Transfer of Care Note  Patient: Theresa Pace  Procedure(s) Performed: THORACIC EIGHT-THORACIC NINE LAMINECTOMY FOR RESECTION OF TUMOR (Spine Thoracic)  Patient Location: PACU  Anesthesia Type:General  Level of Consciousness: awake, drowsy, and patient cooperative  Airway & Oxygen Therapy: Patient Spontanous Breathing  Post-op Assessment: Report given to RN, Post -op Vital signs reviewed and stable, and Patient moving all extremities X 4  Post vital signs: Reviewed and stable  Last Vitals:  Vitals Value Taken Time  BP 95/75 07/20/22 2006  Temp    Pulse 128   Resp 12 07/20/22 2007  SpO2 92   Vitals shown include unvalidated device data.  Last Pain:  Vitals:   07/20/22 1700  TempSrc:   PainSc: 8       Patients Stated Pain Goal: 2 ([id number])  Complications: No notable events documented.

## 2022-07-20 NOTE — Anesthesia Preprocedure Evaluation (Addendum)
Anesthesia Evaluation  Patient identified by MRN, date of birth, ID band Patient awake    Reviewed: Allergy & Precautions, H&P , NPO status , Patient's Chart, lab work & pertinent test results  Airway Mallampati: II  TM Distance: >3 FB Neck ROM: full    Dental  (+) Dental Advisory Given, Edentulous Upper, Edentulous Lower   Pulmonary asthma , pneumonia, COPD, former smoker   Pulmonary exam normal breath sounds clear to auscultation       Cardiovascular Exercise Tolerance: Good negative cardio ROS  Rhythm:Irregular Rate:Normal  Echo 2021  1. Left ventricular ejection fraction, by visual estimation, is 55%. The left ventricle has normal function. There is mildly increased left ventricular hypertrophy.   2. Abnormal septal motion consistent with left bundle branch block.   3. Elevated left ventricular end-diastolic pressure.   4. Indeterminate diastolic filling due to E-A fusion.   5. The left ventricle has no regional wall motion abnormalities.   6. Global right ventricle has normal systolic function.The right ventricular size is normal. No increase in right ventricular wall thickness.   7. Left atrial size was normal.   8. Right atrial size was normal.   9. Mild mitral annular calcification.  10. The mitral valve is grossly normal. Mild mitral valve regurgitation.  11. The tricuspid valve is normal in structure.  12. The tricuspid valve is normal in structure. Tricuspid valve regurgitation is trivial.  13. The aortic valve was not well visualized. Aortic valve regurgitation is not visualized. No evidence of aortic valve sclerosis or stenosis.  14. The pulmonic valve was not well visualized. Pulmonic valve regurgitation is not visualized.  15. The inferior vena cava is normal in size with greater than 50% respiratory variability, suggesting right atrial pressure of 3 mmHg.     Neuro/Psych  PSYCHIATRIC DISORDERS Anxiety Depression      Neuromuscular disease    GI/Hepatic Neg liver ROS,GERD  Medicated,,  Endo/Other  negative endocrine ROS    Renal/GU negative Renal ROS  negative genitourinary   Musculoskeletal  (+) Arthritis ,  Fibromyalgia -  Abdominal   Peds  Hematology negative hematology ROS (+)   Anesthesia Other Findings   Reproductive/Obstetrics negative OB ROS                             Anesthesia Physical Anesthesia Plan  ASA: 3  Anesthesia Plan: General   Post-op Pain Management: Tylenol PO (pre-op)* and Gabapentin PO (pre-op)*   Induction:   PONV Risk Score and Plan: 4 or greater and Ondansetron, Dexamethasone and Treatment may vary due to age or medical condition  Airway Management Planned: Oral ETT  Additional Equipment: Arterial line  Intra-op Plan:   Post-operative Plan: Possible Post-op intubation/ventilation  Informed Consent: I have reviewed the patients History and Physical, chart, labs and discussed the procedure including the risks, benefits and alternatives for the proposed anesthesia with the patient or authorized representative who has indicated his/her understanding and acceptance.     Dental advisory given  Plan Discussed with: CRNA  Anesthesia Plan Comments: (2 x PIV (18 or greater))        Anesthesia Quick Evaluation

## 2022-07-20 NOTE — Interval H&P Note (Signed)
History and Physical Interval Note:  07/20/2022 5:03 PM  Theresa Pace  has presented today for surgery, with the diagnosis of Thoracic epidural mass static to murmur with mild with a.  The various methods of treatment have been discussed with the patient and family. After consideration of risks, benefits and other options for treatment, the patient has consented to  Procedure(s) with comments: THORACIC LAMINECTOMY FOR TUMOR (N/A) - T8-T9, available at 4 PM as a surgical intervention.  The patient's history has been reviewed, patient examined, no change in status, stable for surgery.  I have reviewed the patient's chart and labs.  Questions were answered to the patient's satisfaction.     Cooper Render Marciano Mundt

## 2022-07-20 NOTE — Plan of Care (Signed)
  Problem: Education: Goal: Knowledge of General Education information will improve Description: Including pain rating scale, medication(s)/side effects and non-pharmacologic comfort measures Outcome: Progressing   Problem: Safety: Goal: Ability to remain free from injury will improve Outcome: Progressing   

## 2022-07-20 NOTE — Anesthesia Procedure Notes (Addendum)
Arterial Line Insertion Start/End2/28/2024 6:05 PM, 07/20/2022 6:10 PM Performed by: Santa Lighter, MD, anesthesiologist  Patient location: OR. Preanesthetic checklist: patient identified, IV checked, site marked, risks and benefits discussed, surgical consent, monitors and equipment checked, pre-op evaluation, timeout performed and anesthesia consent Lidocaine 1% used for infiltration Right, radial was placed Catheter size: 20 G Hand hygiene performed  and maximum sterile barriers used   Attempts: 1 (attempted x 2 on L side with blood return but could not thread by Junie Bame, CRNA.) Procedure performed without using ultrasound guided technique. Following insertion, dressing applied and Biopatch. Post procedure assessment: normal and unchanged  Post procedure complications: second provider assisted. Patient tolerated the procedure well with no immediate complications.

## 2022-07-20 NOTE — Anesthesia Procedure Notes (Addendum)
Procedure Name: Intubation Date/Time: 07/20/2022 6:03 PM  Performed by: Rande Brunt, CRNAPre-anesthesia Checklist: Patient identified, Patient being monitored, Timeout performed, Emergency Drugs available and Suction available Patient Re-evaluated:Patient Re-evaluated prior to induction Oxygen Delivery Method: Circle system utilized Preoxygenation: Pre-oxygenation with 100% oxygen Induction Type: IV induction Ventilation: Mask ventilation without difficulty Laryngoscope Size: Mac and 4 Grade View: Grade I Tube type: Oral Tube size: 7.0 mm Number of attempts: 1 Airway Equipment and Method: Stylet Placement Confirmation: ETT inserted through vocal cords under direct vision, positive ETCO2 and breath sounds checked- equal and bilateral Secured at: 21 cm Tube secured with: Tape Dental Injury: Teeth and Oropharynx as per pre-operative assessment  Comments: Placed by Elvin So CRNA

## 2022-07-21 ENCOUNTER — Inpatient Hospital Stay (HOSPITAL_COMMUNITY): Payer: Medicaid Other

## 2022-07-21 ENCOUNTER — Encounter (HOSPITAL_COMMUNITY): Payer: Self-pay | Admitting: Neurosurgery

## 2022-07-21 DIAGNOSIS — N390 Urinary tract infection, site not specified: Secondary | ICD-10-CM

## 2022-07-21 DIAGNOSIS — R4182 Altered mental status, unspecified: Secondary | ICD-10-CM

## 2022-07-21 DIAGNOSIS — M4804 Spinal stenosis, thoracic region: Secondary | ICD-10-CM

## 2022-07-21 DIAGNOSIS — G9341 Metabolic encephalopathy: Secondary | ICD-10-CM

## 2022-07-21 DIAGNOSIS — J42 Unspecified chronic bronchitis: Secondary | ICD-10-CM

## 2022-07-21 LAB — PROCALCITONIN: Procalcitonin: <0.1 ng/mL

## 2022-07-21 LAB — BLOOD GAS, ARTERIAL
Acid-Base Excess: 2.4 mmol/L — ABNORMAL HIGH (ref 0.0–2.0)
Bicarbonate: 26.4 mmol/L (ref 20.0–28.0)
Drawn by: 27022
O2 Saturation: 96.4 %
Patient temperature: 36.5
pCO2 arterial: 37 mmHg (ref 32–48)
pH, Arterial: 7.46 — ABNORMAL HIGH (ref 7.35–7.45)
pO2, Arterial: 67 mmHg — ABNORMAL LOW (ref 83–108)

## 2022-07-21 LAB — URINALYSIS, W/ REFLEX TO CULTURE (INFECTION SUSPECTED)
Bilirubin Urine: NEGATIVE
Glucose, UA: NEGATIVE mg/dL
Hgb urine dipstick: NEGATIVE
Ketones, ur: 15 mg/dL — AB
Leukocytes,Ua: NEGATIVE
Nitrite: NEGATIVE
Protein, ur: 30 mg/dL — AB
Specific Gravity, Urine: >1.03 — ABNORMAL HIGH (ref 1.005–1.030)
pH: 5.5 (ref 5.0–8.0)

## 2022-07-21 LAB — CBC WITH DIFFERENTIAL/PLATELET
Abs Immature Granulocytes: 0.09 10*3/uL — ABNORMAL HIGH (ref 0.00–0.07)
Basophils Absolute: 0 10*3/uL (ref 0.0–0.1)
Basophils Relative: 0 %
Eosinophils Absolute: 0 10*3/uL (ref 0.0–0.5)
Eosinophils Relative: 0 %
HCT: 30.1 % — ABNORMAL LOW (ref 36.0–46.0)
Hemoglobin: 9.8 g/dL — ABNORMAL LOW (ref 12.0–15.0)
Immature Granulocytes: 1 %
Lymphocytes Relative: 8 %
Lymphs Abs: 1.2 10*3/uL (ref 0.7–4.0)
MCH: 30.6 pg (ref 26.0–34.0)
MCHC: 32.6 g/dL (ref 30.0–36.0)
MCV: 94.1 fL (ref 80.0–100.0)
Monocytes Absolute: 0.5 10*3/uL (ref 0.1–1.0)
Monocytes Relative: 3 %
Neutro Abs: 13.3 10*3/uL — ABNORMAL HIGH (ref 1.7–7.7)
Neutrophils Relative %: 88 %
Platelets: 500 10*3/uL — ABNORMAL HIGH (ref 150–400)
RBC: 3.2 MIL/uL — ABNORMAL LOW (ref 3.87–5.11)
RDW: 17.5 % — ABNORMAL HIGH (ref 11.5–15.5)
WBC: 15.1 10*3/uL — ABNORMAL HIGH (ref 4.0–10.5)
nRBC: 0 % (ref 0.0–0.2)

## 2022-07-21 LAB — COMPREHENSIVE METABOLIC PANEL
ALT: 12 U/L (ref 0–44)
AST: 20 U/L (ref 15–41)
Albumin: 1.8 g/dL — ABNORMAL LOW (ref 3.5–5.0)
Alkaline Phosphatase: 77 U/L (ref 38–126)
Anion gap: 13 (ref 5–15)
BUN: 29 mg/dL — ABNORMAL HIGH (ref 6–20)
CO2: 24 mmol/L (ref 22–32)
Calcium: 7.4 mg/dL — ABNORMAL LOW (ref 8.9–10.3)
Chloride: 99 mmol/L (ref 98–111)
Creatinine, Ser: 0.79 mg/dL (ref 0.44–1.00)
GFR, Estimated: >60 mL/min (ref >60)
Glucose, Bld: 273 mg/dL — ABNORMAL HIGH (ref 70–99)
Potassium: 4.2 mmol/L (ref 3.5–5.1)
Sodium: 136 mmol/L (ref 135–145)
Total Bilirubin: 0.4 mg/dL (ref 0.3–1.2)
Total Protein: 5.2 g/dL — ABNORMAL LOW (ref 6.5–8.1)

## 2022-07-21 LAB — BRAIN NATRIURETIC PEPTIDE: B Natriuretic Peptide: 118.2 pg/mL — ABNORMAL HIGH (ref 0.0–100.0)

## 2022-07-21 LAB — LACTIC ACID, PLASMA
Lactic Acid, Venous: 1.8 mmol/L (ref 0.5–1.9)
Lactic Acid, Venous: 1.5 mmol/L (ref 0.5–1.9)

## 2022-07-21 LAB — C-REACTIVE PROTEIN: CRP: 7.9 mg/dL — ABNORMAL HIGH (ref <1.0)

## 2022-07-21 MED ORDER — SODIUM CHLORIDE 0.9 % IV SOLN
2.0000 g | INTRAVENOUS | Status: AC
  Administered 2022-07-22 – 2022-07-24 (×3): 2 g via INTRAVENOUS
  Filled 2022-07-21 (×3): qty 20

## 2022-07-21 MED ORDER — LORAZEPAM 2 MG/ML IJ SOLN: 0.5000 mg | Freq: Once | INTRAMUSCULAR | Status: AC

## 2022-07-21 MED ORDER — LORAZEPAM 2 MG/ML IJ SOLN
0.5000 mg | Freq: Four times a day (QID) | INTRAMUSCULAR | Status: DC | PRN
  Filled 2022-07-21: qty 1

## 2022-07-21 MED ORDER — SODIUM CHLORIDE 0.9 % IV SOLN
1.0000 g | INTRAVENOUS | Status: DC
  Administered 2022-07-21: 1 g via INTRAVENOUS
  Filled 2022-07-21: qty 10

## 2022-07-21 MED ORDER — NALOXONE HCL 0.4 MG/ML IJ SOLN: 0.4000 mg | INTRAMUSCULAR | Status: DC | PRN

## 2022-07-21 MED ORDER — DEXAMETHASONE SODIUM PHOSPHATE 4 MG/ML IJ SOLN
2.0000 mg | Freq: Two times a day (BID) | INTRAMUSCULAR | Status: DC
  Administered 2022-07-21 – 2022-07-24 (×6): 2 mg via INTRAVENOUS
  Filled 2022-07-21 (×6): qty 1

## 2022-07-21 MED ORDER — PANTOPRAZOLE SODIUM 40 MG PO TBEC
40.0000 mg | DELAYED_RELEASE_TABLET | Freq: Every day | ORAL | Status: DC
  Administered 2022-07-21: 40 mg via ORAL
  Filled 2022-07-21 (×2): qty 1

## 2022-07-21 MED ORDER — LORAZEPAM 2 MG/ML IJ SOLN
INTRAMUSCULAR | Status: AC
  Administered 2022-07-21: 0.5 mg via INTRAMUSCULAR
  Filled 2022-07-21: qty 1

## 2022-07-21 MED ORDER — SODIUM CHLORIDE 0.9 % IV SOLN: INTRAVENOUS | Status: AC

## 2022-07-21 MED ORDER — MELATONIN 5 MG PO TABS
5.0000 mg | ORAL_TABLET | Freq: Every day | ORAL | Status: DC
  Administered 2022-07-21 – 2022-07-26 (×3): 5 mg via ORAL
  Filled 2022-07-21 (×4): qty 1

## 2022-07-21 MED ORDER — LORAZEPAM 2 MG/ML IJ SOLN: 0.5000 mg | Freq: Once | INTRAMUSCULAR | Status: DC

## 2022-07-21 NOTE — Consult Note (Addendum)
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  Patient also has a significant history of metastatic melanoma status postresection of lesion and ongoing immunotherapy for metastasis.  She has followed by [facility] by Dr. Baltazar Najjar.  According to the medical record and the patient's husband she had had issues with progressive difficulty walking, worsening back pain and weakness of lower extremities.  She maintain contact with the hematology team.  Over the past 2 weeks the symptoms have worsened and because of significant pain and inability to ambulate EMS was called and the patient was initially brought to [facility] emergency department on 2/27.  Imaging done at that time revealed osseous metastasis involving T8 and T9 vertebral bodies with associated pathological compression fractures with associated extraosseous and epidural extension of tumor into the ventral epidural space at the level of T9 with resultant severe spinal stenosis and cord compression.  Emergent neurosurgical consultation was arranged and patient was transferred to [facility] and admitted by the neurosurgical team.  In the evening of 2/28 she underwent T8-T9 decompressive laminectomy with resection of epidural tumor and microdissection by Dr. Trenton Gammon.  Postoperatively her neurological deficits are improved  on exam.  She has yet to ambulate.  Of concern to the primary team patient was having worsening confusion and hospitalist consultation was requested.  History was obtained from the patient the medical record and the patient's husband who is at the bedside.  Husband states that the patient does have intermittent episodes of confusion that typically only last for 24 hours.  Somewhat worse with administration of anesthesia and hospitalization.  Over the past 24 hours it has been determined that the patient has a UTI, she has been given steroids, she has received both anesthesia and pain medicines that are higher than her usual chronic dose.  Patient has not had any visualized seizure activity and upon review of the medical records seizures apparently were precipitated in the past by muscular skeletal relaxant agents.  Upon my initial evaluation of the patient she was very restless, tachypneic and tachycardic.  She was complaining of back pain.  EKG revealed sinus tachycardia with PVCs.  Patient has subsequently improved in regards to restlessness and tachypnea after administration of pain medications.  She was confused and answered "uh huh" to every question asked of her.  My attending Dr. Sloan Leiter was also at the bedside we discussed this patient.  An appropriate plan of care was formulated as below.  We will also assume attending responsibilities.  Assessment and Plan: Acute metabolic encephalopathy Appears to be an acute on chronic issue according to history obtained from patient's husband Acute issues likely multifactorial secondary to recent anesthesia, increase in baseline pain medications due to acute pain, UTI and concomitant use of steroids Continue to follow closely and provide for safe environment for patient Avoid benzodiazepines since these can precipitate agitation in older patients Treat underlying causes such as inadequately controlled pain and infection With seizure history will obtain EEG as  a  precaution Check electrolytes and CBC  Known metastatic melanoma with ongoing immunotherapy T8/T9 pathologic compression fracture secondary to tumor invasion status post decompressive laminectomy with tumor resection on 2/28 Patient followed by Dr. Baltazar Najjar at [facility] Initially dx'd July 2022: April 2023 was found to have hypermetabolic soft tissue lesions in the right upper lobe on PET with bronchoscopy in May 2023 found to be normal.  By December 2023 CT revealed multiple solid nodules in both lungs more on the right with fine-needle aspiration biopsy in interventional radiology demonstrating right metastatic melanoma. Tested positive for Stage IIB (T3bN0M0) melanoma back. BRAF V600E/D. Elevated LDH 855.  Immunotherapy with nivolumab started January 2024 [facility] ED provider attempted to transfer patient to [facility] but there were no beds available Neurosurgery has contacted radiation oncology regarding new changes in patient's status. \ Postoperative care including pain management per the neurosurgical team Anticipate will require PT/OT during this admission prior to discharge  COPD/asthma history Not actively wheezing on exam but on posterior exam she did have some crackles and appeared to be splinting secondary to back pain As a precaution for blood chest x-ray has been ordered as well as ABG. Order albuterol every 6 hours as needed shortness of breath or increased work of breathing Continue Breo Ellipta and Incruse Ellipta as prior to admission unless otherwise recommended by pharmacy Flutter valve  UTI Abdomen was tender to palpation in the pelvic region Almost 2 L positive but with only minimal urine output postoperatively RN aware needs to check bladder scan to ensure no urinary retention Empiric IV Rocephin Since patient is on chronic immunotherapy need to exclude SIRS/sepsis.  Will check CRP, lactic acid, urine culture, blood cultures, ABG and procalcitonin Patient did have  tachypnea which resolved with administration of pain medications If necessary can perform in and out catheterization for urinary retention-if recurrent will need to have Foley catheter inserted  Mild volume depletion Oral mucous membranes are dry and extremities are cool to the touch Will cautiously give 500 cc normal saline at 75 cc/h for 5 hours  History of inappropriate tachycardia Continue calcium channel blocker Patient no longer taking Adderall secondary to  Chronic pain/fibromyalgia/chronic depression Prior to admission patient was taking Norco 10-'3 25 3 '$ times daily, baclofen 10 mg 3 times daily, Flexeril 5 mg 3 times daily as needed for muscle spasms Given encephalopathy will not order Flexeril -continue preadmission Lioresal Continue Abilify and Desyrel Based on med rec appears to no longer be taking Viberzi  HLD Continue Crestor    Review of Systems: As mentioned in the history of present illness. All other systems reviewed and are negative. Past Medical History:  Diagnosis Date   Arthritis    Asthma    Cancer ([place])    COPD (chronic obstructive pulmonary disease) ([place])    Depression    patient states she is no longer depressed   Fibromyalgia    Frequent urination at night    GERD (gastroesophageal reflux disease)    Hypercholesteremia    IBS (irritable bowel syndrome)    Tachycardia    treated with Cardizem   Tendonitis    Past Surgical History:  Procedure Laterality Date   ABDOMINAL HYSTERECTOMY     APPENDECTOMY     BIOPSY  05/31/2021   Procedure: BIOPSY;  Surgeon: Daneil Dolin, MD;  Location: [facility];  Service: Endoscopy;;   COLONOSCOPY  05/2014   2 sessile polyps, 3-7 mm in size (Tubular adenomas) and internal hemorrhoids. diverticulosis   COLONOSCOPY  WITH PROPOFOL N/A 05/31/2021   three 4-6 mm polyps, segmental biopsies of colon, normal TI. Tubular adenomas and hyperplastic. Negative colonic biopsies for microscopic colitis. Surveillance in 5  years.   KNEE ARTHROPLASTY Right 05/23/2017   LAMINECTOMY N/A 07/20/2022   Procedure: THORACIC EIGHT-THORACIC NINE LAMINECTOMY FOR RESECTION OF TUMOR;  Surgeon: Earnie Larsson, MD;  Location: [facility];  Service: Neurosurgery;  Laterality: N/A;  T8-T9, available at 4 PM   POLYPECTOMY  05/31/2021   Procedure: POLYPECTOMY;  Surgeon: Daneil Dolin, MD;  Location: [facility];  Service: Endoscopy;;   skin cancer removal from back     Social History:  reports that she quit smoking about 10 months ago. Her smoking use included cigarettes. She smoked an average of .5 packs per day. She has been exposed to tobacco smoke. She has never used smokeless tobacco. She reports that she does not drink alcohol and does not use drugs.  Allergies  Allergen Reactions   Methadone Hives   Aspirin Other (See Comments)    Stomach Pain    Family History  Problem Relation Age of Onset   Lung cancer Mother        deceased at 58   Diabetes Brother    Colon cancer Neg Hx     Prior to Admission medications   Medication Sig Start Date End Date Taking? Authorizing Provider  acetaminophen (TYLENOL) 325 MG tablet Take by mouth. 01/18/21  Yes [provider]  ADDERALL XR 30 MG 24 hr capsule Take 30 mg by mouth in the morning, at noon, and at bedtime. 04/22/21  Yes [provider]  albuterol (PROVENTIL HFA;VENTOLIN HFA) 108 (90 BASE) MCG/ACT inhaler Inhale 2 puffs into the lungs every 6 (six) hours as needed for wheezing or shortness of breath.    Yes [provider]  albuterol (PROVENTIL) (2.5 MG/3ML) 0.083% nebulizer solution Inhale into the lungs. 02/14/13  Yes [provider]  ARIPiprazole (ABILIFY) 10 MG tablet Take 10 mg by mouth daily. 01/21/22  Yes [provider]  aspirin EC 81 MG tablet Take 81 mg by mouth every evening.   Yes [provider]  baclofen (LIORESAL) 10 MG tablet Take 10 mg by mouth 3 (three) times daily.   Yes [provider]  budesonide  (PULMICORT) 0.5 MG/2ML nebulizer solution Inhale into the lungs. 11/29/18  Yes [provider]  cyclobenzaprine (FLEXERIL) 5 MG tablet Take 1 tablet (5 mg total) by mouth 3 (three) times daily as needed for muscle spasms. Do not drink alcohol or drive while taking this medication.  May cause drowsiness. 03/13/22  Yes Volney American, PA-C  diltiazem (CARDIZEM CD) 120 MG 24 hr capsule Take 120 mg by mouth in the morning. 04/22/21  Yes [provider]  Eluxadoline (VIBERZI) 100 MG TABS Take 1 tablet (100 mg total) by mouth 2 (two) times daily with a meal. 02/03/22  Yes Annitta Needs, NP  Fluticasone-Umeclidin-Vilant (TRELEGY ELLIPTA) 100-62.5-25 MCG/ACT AEPB Inhale into the lungs. 01/13/21  Yes [provider]  HYDROcodone-acetaminophen (NORCO) 10-325 MG tablet Take 1 tablet by mouth 3 (three) times daily.   Yes [provider]  naloxone [facility]) nasal spray 4 mg/0.1 mL SMARTSIG:Both Nares 06/02/22  Yes [provider]  ondansetron (ZOFRAN-ODT) 8 MG disintegrating tablet Take 8 mg by mouth 3 (three) times daily. 05/31/22  Yes [provider]  rosuvastatin (CRESTOR) 20 MG tablet Take 20 mg by mouth every evening.   Yes [provider]  traZODone (DESYREL)  50 MG tablet Take 150 mg by mouth at bedtime.    Yes [provider]  amoxicillin-clavulanate (AUGMENTIN) 875-125 MG tablet Take 1 tablet by mouth 2 (two) times daily. Patient not taking: Reported on 07/16/2022 04/11/22   Tanda Rockers, MD  naproxen (NAPROSYN) 375 MG tablet Take 1 tablet (375 mg total) by mouth 2 (two) times daily as needed. Patient not taking: Reported on 07/18/2022 03/13/22   Volney American, PA-C  Polyethyl Glycol-Propyl Glycol (LUBRICANT EYE DROPS) 0.4-0.3 % SOLN Place 1-2 drops into both eyes 3 (three) times daily as needed (dry/irritated eyes.).    [provider]    Physical Exam: Vitals:   07/21/22 0800 07/21/22 0813 07/21/22 0838 07/21/22  1058  BP: 93/64   118/63  Pulse: (!) 119 (!) 112 (!) 106 (!) 102  Resp: '16 17  18  '$ Temp: 98.4 F (36.9 C)   97.7 F (36.5 C)  TempSrc: Axillary   Oral  SpO2:  95%  93%  Weight:      Height:       Constitutional: NAD, initially was restless and uncomfortable but after administration of pain medications this improved remarkably and patient was resting quietly Eyes: PERRL, lids and conjunctivae normal ENMT: Mucous membranes are dry.  Patient is edentulous posterior pharynx clear of any exudate or lesions. Neck: normal, supple, no masses, no thyromegaly Respiratory: On posterior exam patient was splinting with bilateral crackles mid fields left greater than right, lower lung fields diminished auscultation.  No wheezing, no crackles. Normal respiratory effort.  She was tachypneic without accessory muscle use.  Cardiovascular: Intermittent tachycardia with rates up into the 120s.  Somewhat irregular with frequent unifocal PVCs., no murmurs / rubs / gallops. No extremity edema. 2+ pedal pulses. No carotid bruits.  Extremities especially feet cool to touch but appropriate capillary refill noted Abdomen: Minimal suprapubic tenderness without guarding or rebounding, no masses palpated. No hepatosplenomegaly. Bowel sounds positive.  Musculoskeletal: no clubbing / cyanosis. No joint deformity upper and lower extremities. Good ROM, no contractures. Normal muscle tone.  Skin: no rashes, lesions, ulcers. No induration Neurologic: CN 2-12 grossly intact. Sensation intact, DTR normal. Strength 5/5 x all 4 extremities.  Psychiatric: Alert and oriented x name only.  Restless and at times anxious mood.    Data Reviewed:   Multiple labs ordered today and are pending at time of dictation.   Family Communication: Husband at bedside Primary team communication: Spoke with NP Ms. Reinaldo Meeker Thank you very much for involving [place] in the care of your patient.  Author: Erin Hearing, NP 07/21/2022 11:10 AM  For on  call review [email or web address].

## 2022-07-21 NOTE — Progress Notes (Signed)
Pt very restless and confused this am. Pt HR up in the 110's 120's on the monitor d/t agitation and restlessness. Pt keeps on removing and ripping lines and armbands etc. NP Bergman notified and new orders received. Will continue to closely monitor. Pt in bed with call light within reach and bed alarm for safety. PDarden Palmer Cleophas Yoak RN    07/21/22 0800  Vitals  Temp 98.4 F (36.9 C)  Temp Source Axillary  BP 93/64  MAP (mmHg) 73  BP Location Left Arm  BP Method Automatic  Patient Position (if appropriate) Lying  Pulse Rate (!) 119  Pulse Rate Source Monitor  Resp 16  Level of Consciousness  Level of Consciousness Alert  MEWS COLOR  MEWS Score Color Yellow  MEWS Score  MEWS Temp 0  MEWS Systolic 1  MEWS Pulse 2  MEWS RR 0  MEWS LOC 0  MEWS Score 3  Provider Notification  Provider Name/Title Viona Gilmore NP  Date Provider Notified 07/21/22  Time Provider Notified 0800  Method of Notification Page (secure chat)  Notification Reason Other (Comment) (agitation and yellow mews d/t HR)  Provider response See new orders  Date of Provider Response 07/21/22  Time of Provider Response [phone]

## 2022-07-21 NOTE — Progress Notes (Signed)
Postop day 1.  Patient somewhat restless and moderately confused postoperatively.  Her vital signs are stable.  She is afebrile.  She does have evidence of significant UTI and will be started on antibiotics.  Her strength is good in both lower extremities.  She has no complaints of chest pain or other problem.  Overall progressing about as would be expected following laminectomy and resection of epidural tumor.  Continue steroids but start to lessen the dose.  Begin efforts at mobilization.  Plan to get medicine involved.

## 2022-07-21 NOTE — Progress Notes (Signed)
PT Cancellation Note  Patient Details Name: Theresa Pace MRN: [id number] DOB: 20-Jan-1961   Cancelled Treatment:    Reason Eval/Treat Not Completed: Medical issues which prohibited therapy  Patient agitated this morning and recently received ativan. Now sleeping. Will attempt 3/1   Theresa Pace, [place]  Office [phone]  Rexanne Mano 07/21/2022, 2:07 PM

## 2022-07-21 NOTE — Progress Notes (Signed)
OT Cancellation Note  Patient Details Name: KALON DIMOCK MRN: [id number] DOB: 1960-11-17   Cancelled Treatment:    Reason Eval/Treat Not Completed: Patient's level of consciousness (Patient agitated this morning and recently received ativan. Now sleeping and unable to participate in OT treatment session. Will re-attempt when pt is able.)  Ailene Ravel, OTR/L,CBIS  Supplemental OT - [facility] and [facility] Secure Chat Preferred   07/21/2022, 2:15 PM

## 2022-07-21 NOTE — Progress Notes (Signed)
Pt extremely restless on arrival to 4NP03, attempting to remove mittens and pulling at lines/ tubes. Pt given scheduled night time meds.  2215: pt pulling off mittens and not redirectable, increasingly more restless and agitated, pulling at IV tubing and BP cuff, trying to get OOB, this RN removed all iv's due to malposition/ leaking and removed mittens to allow pt to relax with no success, pt still restless and not easily redirected, bed alarm on, floor mats down   2240: dilaudid given due to HR and restlessness  0017: pt attempting to get OOB, restless, given another dose of IV dilaudid with success, pt resting comfortably   0550: pt restless trying to get OOB, pulling lines, despite redirection and pain medication, placed headstart posey belt on, call bell in reach  Perfecto Kingdom, RN

## 2022-07-21 NOTE — Procedures (Addendum)
Patient Name: Theresa Pace  MRN: [id number]  Epilepsy Attending: Lora Havens  Referring Physician/Provider: Jonetta Osgood, MD  Date: 07/21/2022 Duration: 22.25 mins  Patient history: 62yo F with ams. EEG to evaluate for seizure  Level of alertness: Awake  AEDs during EEG study: None  Technical aspects: This EEG study was done with scalp electrodes positioned according to the 10-20 International system of electrode placement. Electrical activity was reviewed with band pass filter of 1-'70Hz'$ , sensitivity of 7 uV/mm, display speed of 11m/sec with a '60Hz'$  notched filter applied as appropriate. EEG data were recorded continuously and digitally stored.  Video monitoring was available and reviewed as appropriate.  Description: No clear posterior dominant rhythm was seen. EEG showed continuous generalized  3 to 6 Hz theta-delta slowing. Generalized periodic discharges with triphasic morphology were also noted  at  1-1.5 Hz. Hyperventilation and photic stimulation were not performed.     ABNORMALITY - Periodic discharges with triphasic morphology, generalized ( GPDs) - Continuous slow, generalized  IMPRESSION: This study showed generalized periodic discharges with triphasic morphology which can be on the ictal-interictal continuum. However, the frequency and morphology is more commonly seen  secondary to toxic-metabolic causes. Additionally there is moderate diffuse encephalopathy. No seizures or definite epileptiform discharges were seen throughout the recording.  If concern for seizures persist, consider long term monitoring.   [name]

## 2022-07-22 ENCOUNTER — Encounter: Payer: Self-pay | Admitting: Genetic Counselor

## 2022-07-22 ENCOUNTER — Inpatient Hospital Stay (HOSPITAL_COMMUNITY): Payer: Medicaid Other

## 2022-07-22 DIAGNOSIS — I10 Essential (primary) hypertension: Secondary | ICD-10-CM

## 2022-07-22 DIAGNOSIS — R569 Unspecified convulsions: Secondary | ICD-10-CM | POA: Diagnosis not present

## 2022-07-22 DIAGNOSIS — M4804 Spinal stenosis, thoracic region: Secondary | ICD-10-CM | POA: Diagnosis not present

## 2022-07-22 DIAGNOSIS — G9520 Unspecified cord compression: Secondary | ICD-10-CM | POA: Diagnosis not present

## 2022-07-22 LAB — CBC
HCT: 28.9 % — ABNORMAL LOW (ref 36.0–46.0)
Hemoglobin: 9.3 g/dL — ABNORMAL LOW (ref 12.0–15.0)
MCH: 30 pg (ref 26.0–34.0)
MCHC: 32.2 g/dL (ref 30.0–36.0)
MCV: 93.2 fL (ref 80.0–100.0)
Platelets: 465 10*3/uL — ABNORMAL HIGH (ref 150–400)
RBC: 3.1 MIL/uL — ABNORMAL LOW (ref 3.87–5.11)
RDW: 17.8 % — ABNORMAL HIGH (ref 11.5–15.5)
WBC: 10.7 10*3/uL — ABNORMAL HIGH (ref 4.0–10.5)
nRBC: 0 % (ref 0.0–0.2)

## 2022-07-22 LAB — AMMONIA: Ammonia: 27 umol/L (ref 9–35)

## 2022-07-22 LAB — MAGNESIUM: Magnesium: 2.3 mg/dL (ref 1.7–2.4)

## 2022-07-22 LAB — BASIC METABOLIC PANEL
Anion gap: 13 (ref 5–15)
BUN: 34 mg/dL — ABNORMAL HIGH (ref 6–20)
CO2: 21 mmol/L — ABNORMAL LOW (ref 22–32)
Calcium: 7.3 mg/dL — ABNORMAL LOW (ref 8.9–10.3)
Chloride: 105 mmol/L (ref 98–111)
Creatinine, Ser: 0.85 mg/dL (ref 0.44–1.00)
GFR, Estimated: >60 mL/min (ref >60)
Glucose, Bld: 153 mg/dL — ABNORMAL HIGH (ref 70–99)
Potassium: 4 mmol/L (ref 3.5–5.1)
Sodium: 139 mmol/L (ref 135–145)

## 2022-07-22 LAB — VITAMIN B12: Vitamin B-12: 823 pg/mL (ref 180–914)

## 2022-07-22 LAB — SURGICAL PATHOLOGY

## 2022-07-22 MED ORDER — LORAZEPAM 2 MG/ML IJ SOLN
INTRAMUSCULAR | Status: AC
  Administered 2022-07-22: 2 mg
  Filled 2022-07-22: qty 1

## 2022-07-22 MED ORDER — LEVETIRACETAM IN NACL 500 MG/100ML IV SOLN
500.0000 mg | Freq: Two times a day (BID) | INTRAVENOUS | Status: DC
  Administered 2022-07-22 – 2022-07-30 (×16): 500 mg via INTRAVENOUS
  Filled 2022-07-22 (×17): qty 100

## 2022-07-22 MED ORDER — SODIUM CHLORIDE 0.9 % IV SOLN
4000.0000 mg | Freq: Once | INTRAVENOUS | Status: AC
  Administered 2022-07-22: 4000 mg via INTRAVENOUS
  Filled 2022-07-22: qty 40

## 2022-07-22 MED ORDER — LORAZEPAM 2 MG/ML IJ SOLN
1.0000 mg | Freq: Four times a day (QID) | INTRAMUSCULAR | Status: DC | PRN
  Administered 2022-07-22 – 2022-07-26 (×12): 1 mg via INTRAVENOUS
  Filled 2022-07-22 (×12): qty 1

## 2022-07-22 MED ORDER — SODIUM CHLORIDE 0.9 % IV SOLN: INTRAVENOUS | Status: DC

## 2022-07-22 NOTE — Progress Notes (Signed)
Postop day 1.  Patient with worsening confusion and brief episode of simple partial seizure this morning.  Patient currently awake and minimally verbal.  Not following commands.  Moving all 4 extremities strongly and purposefully to pain.  Pupils are equal and reactive.  Chest and abdomen benign.  Wound healing well.  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  Okay to remove drain today.  Patient's new onset seizures and worsening confusion will require further investigation.  Neurology on board.  Patient will need either a CT of her head or MRI scan of her head for further evaluation.  Continue Decadron 2 mg every 12.

## 2022-07-22 NOTE — Progress Notes (Signed)
LTM EEG hooked up and running - no initial skin breakdown - push button tested - [facility] monitoring. Head Wrap pt get aggravated easily

## 2022-07-22 NOTE — Consult Note (Addendum)
NEUROLOGY CONSULTATION NOTE   Date of service: July 22, 2022 Patient Name: REHEMA SAGEL MRN:  [id number] DOB:  May 14, 1961 Reason for consult: "altered mental status" Requesting physician: "Oren Binet, MD" _ _ _   _ __   _ __ _ _  __ __   _ __   __ _  History of Present Illness   ISADORE KAMRATH is a 62 y.o. female with PMH significant for  has a past medical history of Arthritis, Asthma, Cancer ([place]), COPD (chronic obstructive pulmonary disease) ([place]), Depression, Fibromyalgia, Frequent urination at night, GERD (gastroesophageal reflux disease), Hypercholesteremia, IBS (irritable bowel syndrome), Tachycardia, and Tendonitis. who is currently undergoing immunotherapy for metastatic melanoma admitted to the hospital with worsening back pain and lower extremity weakness and on MRI showed pathological T8/T9 fractures and epidural extension of the tumor now s/p decompression by neurosurgery. Neurology was consulted for altered mental status concerning for seizures   ROS   ROS not obtained - patient unable to participate  Past History   I have reviewed the following:  Past Medical History:  Diagnosis Date   Arthritis    Asthma    Cancer ([place])    COPD (chronic obstructive pulmonary disease) ([place])    Depression    patient states she is no longer depressed   Fibromyalgia    Frequent urination at night    GERD (gastroesophageal reflux disease)    Hypercholesteremia    IBS (irritable bowel syndrome)    Tachycardia    treated with Cardizem   Tendonitis    Past Surgical History:  Procedure Laterality Date   ABDOMINAL HYSTERECTOMY     APPENDECTOMY     BIOPSY  05/31/2021   Procedure: BIOPSY;  Surgeon: Daneil Dolin, MD;  [facility];  Service: Endoscopy;;   COLONOSCOPY  05/2014   2 sessile polyps, 3-7 mm in size (Tubular adenomas) and internal hemorrhoids. diverticulosis   COLONOSCOPY WITH PROPOFOL N/A 05/31/2021   three 4-6 mm polyps, segmental biopsies of colon,  normal TI. Tubular adenomas and hyperplastic. Negative colonic biopsies for microscopic colitis. Surveillance in 5 years.   KNEE ARTHROPLASTY Right 05/23/2017   LAMINECTOMY N/A 07/20/2022   Procedure: THORACIC EIGHT-THORACIC NINE LAMINECTOMY FOR RESECTION OF TUMOR;  Surgeon: Earnie Larsson, MD;  Location: [facility];  Service: Neurosurgery;  Laterality: N/A;  T8-T9, available at 4 PM   POLYPECTOMY  05/31/2021   Procedure: POLYPECTOMY;  Surgeon: Daneil Dolin, MD;  [facility];  Service: Endoscopy;;   skin cancer removal from back     Family History  Problem Relation Age of Onset   Lung cancer Mother        deceased at 20   Diabetes Brother    Colon cancer Neg Hx    Social History   Socioeconomic History   Marital status: Married    Spouse name: Not on file   Number of children: Not on file   Years of education: Not on file   Highest education level: Not on file  Occupational History   Not on file  Tobacco Use   Smoking status: Former    Packs/day: 0.50    Types: Cigarettes    Quit date: 08/21/2021    Years since quitting: 0.9    Passive exposure: Past   Smokeless tobacco: Never  Vaping Use   Vaping Use: Never used  Substance and Sexual Activity   Alcohol use: No   Drug use: No   Sexual activity: Yes  Birth control/protection: Surgical  Other Topics Concern   Not on file  Social History Narrative   Not on file   Social Determinants of Health   Financial Resource Strain: Not on file  Food Insecurity: No Food Insecurity (07/20/2022)   Hunger Vital Sign    Worried About Running Out of Food in the Last Year: Never true    Ran Out of Food in the Last Year: Never true  Transportation Needs: No Transportation Needs (07/20/2022)   PRAPARE - Hydrologist (Medical): No    Lack of Transportation (Non-Medical): No  Physical Activity: Not on file  Stress: Not on file  Social Connections: Not on file   Allergies  Allergen Reactions    Methadone Hives   Aspirin Other (See Comments)    Stomach Pain    Medications   Medications Prior to Admission  Medication Sig Dispense Refill Last Dose   acetaminophen (TYLENOL) 325 MG tablet Take by mouth.   unknown   ADDERALL XR 30 MG 24 hr capsule Take 30 mg by mouth in the morning, at noon, and at bedtime.   07/04/2022   albuterol (PROVENTIL HFA;VENTOLIN HFA) 108 (90 BASE) MCG/ACT inhaler Inhale 2 puffs into the lungs every 6 (six) hours as needed for wheezing or shortness of breath.    07/03/2022   albuterol (PROVENTIL) (2.5 MG/3ML) 0.083% nebulizer solution Inhale into the lungs.   unknown   ARIPiprazole (ABILIFY) 10 MG tablet Take 10 mg by mouth daily.   Past Week   aspirin EC 81 MG tablet Take 81 mg by mouth every evening.   07/18/2022   baclofen (LIORESAL) 10 MG tablet Take 10 mg by mouth 3 (three) times daily.   07/20/2022   budesonide (PULMICORT) 0.5 MG/2ML nebulizer solution Inhale into the lungs.   unknown   cyclobenzaprine (FLEXERIL) 5 MG tablet Take 1 tablet (5 mg total) by mouth 3 (three) times daily as needed for muscle spasms. Do not drink alcohol or drive while taking this medication.  May cause drowsiness. 15 tablet 0 07/18/2022   diltiazem (CARDIZEM CD) 120 MG 24 hr capsule Take 120 mg by mouth in the morning.   07/06/2022   Eluxadoline (VIBERZI) 100 MG TABS Take 1 tablet (100 mg total) by mouth 2 (two) times daily with a meal. 60 tablet 5 Past Week   Fluticasone-Umeclidin-Vilant (TRELEGY ELLIPTA) 100-62.5-25 MCG/ACT AEPB Inhale into the lungs.   Past Week   HYDROcodone-acetaminophen (NORCO) 10-325 MG tablet Take 1 tablet by mouth 3 (three) times daily.   07/12/2022   naloxone (NARCAN) nasal spray 4 mg/0.1 mL SMARTSIG:Both Nares   unknown   ondansetron (ZOFRAN-ODT) 8 MG disintegrating tablet Take 8 mg by mouth 3 (three) times daily.   Past Week   rosuvastatin (CRESTOR) 20 MG tablet Take 20 mg by mouth every evening.   07/18/2022   traZODone (DESYREL) 50 MG tablet Take 150 mg by  mouth at bedtime.    07/18/2022   amoxicillin-clavulanate (AUGMENTIN) 875-125 MG tablet Take 1 tablet by mouth 2 (two) times daily. (Patient not taking: Reported on 07/01/2022) 20 tablet 0 Completed Course   naproxen (NAPROSYN) 375 MG tablet Take 1 tablet (375 mg total) by mouth 2 (two) times daily as needed. (Patient not taking: Reported on 06/29/2022) 20 tablet 0 Completed Course   Polyethyl Glycol-Propyl Glycol (LUBRICANT EYE DROPS) 0.4-0.3 % SOLN Place 1-2 drops into both eyes 3 (three) times daily as needed (dry/irritated eyes.).  Current Facility-Administered Medications:    0.9 %  sodium chloride infusion, , Intravenous, Continuous, Ghimire, Henreitta Leber, MD   acetaminophen (TYLENOL) tablet 650 mg, 650 mg, Oral, Q4H PRN **OR** acetaminophen (TYLENOL) suppository 650 mg, 650 mg, Rectal, Q4H PRN, Earnie Larsson, MD   albuterol (PROVENTIL) (2.5 MG/3ML) 0.083% nebulizer solution 2.5 mg, 2.5 mg, Inhalation, Q6H PRN, Earnie Larsson, MD   ARIPiprazole (ABILIFY) tablet 10 mg, 10 mg, Oral, Daily, Pool, Mallie Mussel, MD, 10 mg at 07/21/22 2142   baclofen (LIORESAL) tablet 10 mg, 10 mg, Oral, TID, Earnie Larsson, MD, 10 mg at 07/21/22 2142   bisacodyl (DULCOLAX) suppository 10 mg, 10 mg, Rectal, Daily PRN, Earnie Larsson, MD   cefTRIAXone (ROCEPHIN) 2 g in sodium chloride 0.9 % 100 mL IVPB, 2 g, Intravenous, Q24H, Ghimire, Henreitta Leber, MD, Last Rate: 200 mL/hr at 07/22/22 0841, 2 g at 07/22/22 0841   dexamethasone (DECADRON) injection 2 mg, 2 mg, Intravenous, Q12H, Pool, Mallie Mussel, MD, 2 mg at 07/22/22 0845   diltiazem (CARDIZEM CD) 24 hr capsule 120 mg, 120 mg, Oral, Daily, Pool, Mallie Mussel, MD, 120 mg at 07/21/22 0836   docusate sodium (COLACE) capsule 100 mg, 100 mg, Oral, BID, Pool, Mallie Mussel, MD, 100 mg at 07/21/22 2142   fluticasone furoate-vilanterol (BREO ELLIPTA) 100-25 MCG/ACT 1 puff, 1 puff, Inhalation, Daily, 1 puff at 07/21/22 0813 **AND** umeclidinium bromide (INCRUSE ELLIPTA) 62.5 MCG/ACT 1 puff, 1 puff, Inhalation,  Daily, Pool, Mallie Mussel, MD, 1 puff at 07/21/22 0813   HYDROcodone-acetaminophen (NORCO/VICODIN) 5-325 MG per tablet 1-2 tablet, 1-2 tablet, Oral, Q4H PRN, Earnie Larsson, MD, 2 tablet at 07/22/22 0329   HYDROmorphone (DILAUDID) injection 0.5-1 mg, 0.5-1 mg, Intravenous, Q2H PRN, Earnie Larsson, MD, 1 mg at 07/21/22 1239   levETIRAcetam (KEPPRA) 4,000 mg in sodium chloride 0.9 % 250 mL IVPB, 4,000 mg, Intravenous, Once **FOLLOWED BY** levETIRAcetam (KEPPRA) IVPB 500 mg/100 mL premix, 500 mg, Intravenous, Q12H, Derek Jack, MD   LORazepam (ATIVAN) injection 0.5 mg, 0.5 mg, Intramuscular, Q6H PRN, Jonetta Osgood, MD, 0.5 mg at 07/21/22 1344   LORazepam (ATIVAN) injection 1 mg, 1 mg, Intravenous, Q6H PRN, Ghimire, Henreitta Leber, MD   melatonin tablet 5 mg, 5 mg, Oral, QHS, Ghimire, Henreitta Leber, MD, 5 mg at 07/21/22 2142   menthol-cetylpyridinium (CEPACOL) lozenge 3 mg, 1 lozenge, Oral, PRN **OR** phenol (CHLORASEPTIC) mouth spray 1 spray, 1 spray, Mouth/Throat, PRN, Pool, Mallie Mussel, MD   methocarbamol (ROBAXIN) 500 mg in dextrose 5 % 50 mL IVPB, 500 mg, Intravenous, Q6H PRN, Earnie Larsson, MD, Stopped at 07/21/22 1120   metoprolol tartrate (LOPRESSOR) injection 5 mg, 5 mg, Intravenous, Q6H PRN, Earnie Larsson, MD   naloxone [facility]) injection 0.4 mg, 0.4 mg, Intravenous, PRN, Ghimire, Henreitta Leber, MD   ondansetron [facility]) injection 4 mg, 4 mg, Intravenous, Q6H PRN, 4 mg at 07/20/22 2022 **OR** ondansetron (ZOFRAN-ODT) disintegrating tablet 4 mg, 4 mg, Oral, Q6H PRN, Pool, Mallie Mussel, MD   pantoprazole (PROTONIX) EC tablet 40 mg, 40 mg, Oral, Daily, Ghimire, Shanker M, MD, 40 mg at 07/21/22 1107   polyethylene glycol (MIRALAX / GLYCOLAX) packet 17 g, 17 g, Oral, Daily PRN, Earnie Larsson, MD   rosuvastatin (CRESTOR) tablet 20 mg, 20 mg, Oral, QPM, Pool, Mallie Mussel, MD, 20 mg at 07/21/22 1729   sodium phosphate (FLEET) 7-19 GM/118ML enema 1 enema, 1 enema, Rectal, Once PRN, Earnie Larsson, MD   traZODone (DESYREL) tablet 150 mg, 150 mg,  Oral, QHS, Pool, Mallie Mussel, MD, 150 mg at 07/21/22 2142  Vitals  Vitals:   07/21/22 1927 07/21/22 2313 07/22/22 0300 07/22/22 0735  BP: (!) 96/53 (!) 87/54 (!) 99/58 (!) 140/86  Pulse:   75 94  Resp: '18 19 20 20  '$ Temp:   98.4 F (36.9 C) 98.9 F (37.2 C)  TempSrc:   Axillary Axillary  SpO2:   98% 95%  Weight:      Height:         Body mass index is 27.42 kg/m.  Physical Exam   Physical Exam Gen: altered, orientation x0 General: in no acute distress and chronically ill-appearing HEENT: normocephalic and atraumatic Respiratory: non-labored breathing and on RA Gastrointestinal: non-distended Cardiovascular: regular rate  Neuro: Mental Status: BRYNDAL BLANKENBAKER is obtunded; she is not oriented to self, place, time, or situation. Speech was  absent . She was unable to follow any commands.  Cranial Nerves: Unable to be tested  Motor: Unable to be tested - patient not following commands  Tone and bulk: no atrophy noted  Sensory: Unable to be tested  Cerebellar: Finger-to-nose test not assessed during this encounter, heel-to-shin test not assessed during this encounter  Gait: not observed during encounter   Labs   CBC:  Recent Labs  Lab 07/17/2022 1555 07/20/22 0055 07/21/22 1126 07/22/22 0652  WBC 11.1*   < > 15.1* 10.7*  NEUTROABS 8.6*  --  13.3*  --   HGB 11.1*   < > 9.8* 9.3*  HCT 34.7*   < > 30.1* 28.9*  MCV 94.0   < > 94.1 93.2  PLT 535*   < > 500* 465*   < > = values in this interval not displayed.    Basic Metabolic Panel:  Lab Results  Component Value Date   NA 139 07/22/2022   K 4.0 07/22/2022   CO2 21 (L) 07/22/2022   GLUCOSE 153 (H) 07/22/2022   BUN 34 (H) 07/22/2022   CREATININE 0.85 07/22/2022   CALCIUM 7.3 (L) 07/22/2022   GFRNONAA >60 07/22/2022   GFRAA >60 06/20/2019   Lipid Panel: No results found for: "LDLCALC" HgbA1c:  Lab Results  Component Value Date   HGBA1C 6.1 (H) 12/26/2018   Urine Drug Screen:     Component Value  Date/Time   LABOPIA POSITIVE (A) 12/15/2020 0816   COCAINSCRNUR NONE DETECTED 12/15/2020 0816   LABBENZ NONE DETECTED 12/15/2020 0816   AMPHETMU NONE DETECTED 12/15/2020 0816   THCU NONE DETECTED 12/15/2020 0816   LABBARB NONE DETECTED 12/15/2020 0816    Alcohol Level     Component Value Date/Time   ETH <10 12/15/2020 0556   MRI Brain pending  Overnight EEG: pending  Impression  Patient has history of epilepsy not currently on AEDs now presenting with seizure-like activity. Lorazepam 2 mg was administered for abortive therapy  Recommendations  - obtain long-term EEG with MRI compatible leads - Keppra loading 60 mg / kg - MRI to r/o stroke - NPO ______________________________________________________________________   Thank you for the opportunity to take part in the care of this patient. If you have any further questions, please contact the neurology consultation attending.  Signed,  Camelia Phenes, MD [facility] (PGY-1) 07/22/2022 9:26 AM  Su Monks, MD [facility] [phone]  If 7pm- 7am, please page neurology on call as listed in [place].  **Any copied and pasted documentation in this note was written by me in another application not billed for and pasted by me into this document.

## 2022-07-22 NOTE — Progress Notes (Signed)
Discussed night time medications with Raenette Rover, NP and she instructed RN to hold all PO meds at this time.

## 2022-07-22 NOTE — Progress Notes (Addendum)
PROGRESS NOTE        PATIENT DETAILS Name: Theresa Pace Age: 62 y.o. Sex: female Date of Birth: 1960/11/19 Admit Date: 06/30/2022 Admitting Physician Earnie Larsson, MD [id number], [facility]  Brief Summary: 62 year old with history of metastatic melanoma, COPD, chronic pain syndrome, ADHD-who is currently undergoing immunotherapy for metastatic melanoma-presented to the hospital with worsening back pain and lower extremity weakness-she evaluated with an MRI which showed pathological T8/T9 fractures and epidural extension of the tumor-she was taken to the OR on 2/28 for decompression by neurosurgery. [facility] was consulted on 2/29 for evaluation of confusion-on 3/1-patient was found to have breakthrough seizures.  See below for further details.   Significant events: 2/27>> admit by neurosurgery 2/28>> T8-T9 decompressive laminectomy with resection of tumor 2/29>> consult to [facility] assumed primary service. 3/01>> seizure-like activity-neurology consulted.  Significant studies: 2/27>> CT abdomen/pelvis: Diffuse progressive metastatic disease throughout lower chest/abdomen/pelvis. 2/27>> MRI T-spine: T8-T9 vertebral bodies compression fracture, extraosseous/epidural extension into the ventral epidural space at T9 with resultant severe spinal stenosis/cord compression. 2/27>> MRI L-spine: Multiple osseous mets involving lumbar spine, no extraosseous or epidural extension of tumor. 2/29>> CXR: Right mid lung alveolar process unchanged.  Significant microbiology data: 2/27>> COVID/influenza/RSV PCR: Negative 2/29>> blood culture: No growth  Procedures: 2/28>> T8-T9 decompressive laminectomy with resection of tumor  Consults: Neurology Neurosurgery  Subjective: Minimally verbal but speaking 1-2 words.  Confused.  Continues to have lip smacking movement.  Objective: Vitals: Blood pressure (!) 140/86, pulse 94, temperature 98.9 F (37.2 C), temperature  source Axillary, resp. rate 20, height '5\' 2"'$  (1.575 m), weight 68 kg, SpO2 95 %.   Exam: Gen Exam: Confused-not in any distress HEENT:atraumatic, normocephalic Chest: B/L clear to auscultation anteriorly CVS:S1S2 regular Abdomen:soft non tender, non distended Extremities:no edema Neurology: Difficult exam but seems to be moving all 4 extremities symmetrically. Skin: no rash  Pertinent Labs/Radiology:    Latest Ref Rng & Units 07/22/2022    6:52 AM 07/21/2022   11:26 AM 07/20/2022   12:55 AM  CBC  WBC 4.0 - 10.5 K/uL 10.7  15.1  10.1   Hemoglobin 12.0 - 15.0 g/dL 9.3  9.8  10.5   Hematocrit 36.0 - 46.0 % 28.9  30.1  33.4   Platelets 150 - 400 K/uL 465  500  474     Lab Results  Component Value Date   NA 139 07/22/2022   K 4.0 07/22/2022   CL 105 07/22/2022   CO2 21 (L) 07/22/2022      Assessment/Plan: Metastatic melanoma with T8/T9 pathologic fracture  Cord compression at T8-T9 level Epidural tumor with myelopathy S/p T8-T9 decompressive laminectomy with resection of tumor on 2/28 History of widespread metastatic melanoma Postoperative care deferred to neurosurgery On Decadron On immunotherapy-oncology at [facility] to have appropriate strength in both lower extremities   Acute toxic metabolic encephalopathy Breakthrough seizure Initially thought to be from postop status/narcotics/UTI-however had seizure-like activity on 3/1 Remains significantly confused today Awaiting LTM EEG results Awaiting MRI brain Continue Keppra Await further recommendations from neurology.  Normocytic anemia Likely due to acute illness/underlying malignancy Follow CBC  COPD Not in exacerbation Continue bronchodilators   Complicated UTI IV Rocephin x 3 days  ?  PAF/MAT Mostly sinus tach today  Since n.p.o. due to confusion-will switch to IV beta-blocker until oral intake is resumed. Check echo Not safe anticoagulation given  recent thoracic spine surgery.  HLD Statin    Chronic pain syndrome IV Dilaudid for severe pain, Norco for moderate pain On chronic narcotics at home   HTN Relatively stable Now on IV metoprolol.  ADHD/fibromyalgia/depression Abilify/trazodone Holding Adderall  BMI: Estimated body mass index is 27.42 kg/m as calculated from the following:   Height as of this encounter: '5\' 2"'$  (1.575 m).   Weight as of this encounter: 68 kg.   Code status:   Code Status: Full Code   DVT Prophylaxis: SCD's Start: 07/20/22 2118 SCDs Start: 06/23/2022 2316    Family Communication: Spouse at bedside on 3/1   Disposition Plan: Status is: Inpatient Remains inpatient appropriate because: Severity of illness   Planned Discharge Destination:Skilled nursing facility   Diet: Diet Order             Diet NPO time specified  Diet effective now                     Antimicrobial agents: Anti-infectives (From admission, onward)    Start     Dose/Rate Route Frequency Ordered Stop   07/22/22 0915  cefTRIAXone (ROCEPHIN) 2 g in sodium chloride 0.9 % 100 mL IVPB        2 g 200 mL/hr over 30 Minutes Intravenous Every 24 hours 07/21/22 1016 07/26/22 0914   07/21/22 0915  cefTRIAXone (ROCEPHIN) 1 g in sodium chloride 0.9 % 100 mL IVPB  Status:  Discontinued        1 g 200 mL/hr over 30 Minutes Intravenous Every 24 hours 07/21/22 0821 07/21/22 1016   07/20/22 2215  ceFAZolin (ANCEF) IVPB 1 g/50 mL premix        1 g 100 mL/hr over 30 Minutes Intravenous Every 8 hours 07/20/22 2118 07/21/22 0610   07/20/22 1007  ceFAZolin (ANCEF) IVPB 2g/100 mL premix        2 g 200 mL/hr over 30 Minutes Intravenous 30 min pre-op 07/20/22 1007 07/20/22 1810        MEDICATIONS: Scheduled Meds:  ARIPiprazole  10 mg Oral Daily   baclofen  10 mg Oral TID   dexamethasone (DECADRON) injection  2 mg Intravenous Q12H   diltiazem  120 mg Oral Daily   docusate sodium  100 mg Oral BID   fluticasone furoate-vilanterol  1 puff Inhalation Daily   And    umeclidinium bromide  1 puff Inhalation Daily   melatonin  5 mg Oral QHS   pantoprazole  40 mg Oral Daily   rosuvastatin  20 mg Oral QPM   traZODone  150 mg Oral QHS   Continuous Infusions:  sodium chloride 100 mL/hr at 07/22/22 0928   cefTRIAXone (ROCEPHIN)  IV 2 g (07/22/22 0841)   levETIRAcetam     methocarbamol (ROBAXIN) IV Stopped (07/21/22 1120)   PRN Meds:.acetaminophen **OR** acetaminophen, albuterol, bisacodyl, HYDROcodone-acetaminophen, HYDROmorphone (DILAUDID) injection, LORazepam, LORazepam, menthol-cetylpyridinium **OR** phenol, methocarbamol (ROBAXIN) IV, metoprolol tartrate, naLOXone (NARCAN)  injection, ondansetron (ZOFRAN) IV **OR** ondansetron, polyethylene glycol, sodium phosphate   I have personally reviewed following labs and imaging studies  LABORATORY DATA: CBC: Recent Labs  Lab 07/12/2022 1555 07/20/22 0055 07/21/22 1126 07/22/22 0652  WBC 11.1* 10.1 15.1* 10.7*  NEUTROABS 8.6*  --  13.3*  --   HGB 11.1* 10.5* 9.8* 9.3*  HCT 34.7* 33.4* 30.1* 28.9*  MCV 94.0 94.4 94.1 93.2  PLT 535* 474* 500* 465*    Basic Metabolic Panel: Recent Labs  Lab 07/17/2022 1555 07/20/22 0055 07/21/22 1126  07/22/22 0652  NA 136 137 136 139  K 3.6 4.5 4.2 4.0  CL 101 104 99 105  CO2 '24 24 24 '$ 21*  GLUCOSE 123* 144* 273* 153*  BUN 23* 19 29* 34*  CREATININE 0.54 0.53 0.79 0.85  CALCIUM 7.6* 7.7* 7.4* 7.3*  MG  --   --   --  2.3    GFR: Estimated Creatinine Clearance: 63.7 mL/min (by C-G formula based on SCr of 0.85 mg/dL).  Liver Function Tests: Recent Labs  Lab 07/02/2022 1555 07/21/22 1126  AST 17 20  ALT 13 12  ALKPHOS 95 77  BILITOT 0.5 0.4  PROT 5.8* 5.2*  ALBUMIN 2.1* 1.8*   No results for input(s): "LIPASE", "AMYLASE" in the last 168 hours. Recent Labs  Lab 07/22/22 0652  AMMONIA 27    Coagulation Profile: No results for input(s): "INR", "PROTIME" in the last 168 hours.  Cardiac Enzymes: No results for input(s): "CKTOTAL", "CKMB",  "CKMBINDEX", "TROPONINI" in the last 168 hours.  BNP (last 3 results) No results for input(s): "PROBNP" in the last 8760 hours.  Lipid Profile: No results for input(s): "CHOL", "HDL", "LDLCALC", "TRIG", "CHOLHDL", "LDLDIRECT" in the last 72 hours.  Thyroid Function Tests: No results for input(s): "TSH", "T4TOTAL", "FREET4", "T3FREE", "THYROIDAB" in the last 72 hours.  Anemia Panel: Recent Labs    07/22/22 0652  VITAMINB12 823    Urine analysis:    Component Value Date/Time   COLORURINE YELLOW 07/21/2022 1311   APPEARANCEUR CLEAR 07/21/2022 1311   LABSPEC >1.030 (H) 07/21/2022 1311   PHURINE 5.5 07/21/2022 1311   GLUCOSEU NEGATIVE 07/21/2022 1311   HGBUR NEGATIVE 07/21/2022 1311   BILIRUBINUR NEGATIVE 07/21/2022 1311   KETONESUR 15 (A) 07/21/2022 1311   PROTEINUR 30 (A) 07/21/2022 1311   UROBILINOGEN 0.2 02/21/2012 0055   NITRITE NEGATIVE 07/21/2022 1311   LEUKOCYTESUR NEGATIVE 07/21/2022 1311    Sepsis Labs: Lactic Acid, Venous    Component Value Date/Time   LATICACIDVEN 1.5 07/21/2022 1416    MICROBIOLOGY: Recent Results (from the past 240 hour(s))  Resp panel by RT-PCR (RSV, Flu A&B, Covid) Anterior Nasal Swab     Status: None   Collection Time: 07/16/2022  5:15 PM   Specimen: Anterior Nasal Swab  Result Value Ref Range Status   SARS Coronavirus 2 by RT PCR NEGATIVE NEGATIVE Final    Comment: (NOTE) SARS-CoV-2 target nucleic acids are NOT DETECTED.  The SARS-CoV-2 RNA is generally detectable in upper respiratory specimens during the acute phase of infection. The lowest concentration of SARS-CoV-2 viral copies this assay can detect is 138 copies/mL. A negative result does not preclude SARS-Cov-2 infection and should not be used as the sole basis for treatment or other patient management decisions. A negative result may occur with  improper specimen collection/handling, submission of specimen other than nasopharyngeal swab, presence of viral mutation(s) within  the areas targeted by this assay, and inadequate number of viral copies(<138 copies/mL). A negative result must be combined with clinical observations, patient history, and epidemiological information. The expected result is Negative.  Fact Sheet for Patients:  EntrepreneurPulse.com.au  Fact Sheet for Healthcare Providers:  IncredibleEmployment.be  This test is no t yet approved or cleared by the [place] FDA and  has been authorized for detection and/or diagnosis of SARS-CoV-2 by FDA under an Emergency Use Authorization (EUA). This EUA will remain  in effect (meaning this test can be used) for the duration of the COVID-19 declaration under Section 564(b)(1) of the Act, 21 U.S.C.section  360bbb-3(b)(1), unless the authorization is terminated  or revoked sooner.       Influenza A by PCR NEGATIVE NEGATIVE Final   Influenza B by PCR NEGATIVE NEGATIVE Final    Comment: (NOTE) The Xpert Xpress SARS-CoV-2/FLU/RSV plus assay is intended as an aid in the diagnosis of influenza from Nasopharyngeal swab specimens and should not be used as a sole basis for treatment. Nasal washings and aspirates are unacceptable for Xpert Xpress SARS-CoV-2/FLU/RSV testing.  Fact Sheet for Patients: EntrepreneurPulse.com.au  Fact Sheet for Healthcare Providers: IncredibleEmployment.be  This test is not yet approved or cleared by the [place] FDA and has been authorized for detection and/or diagnosis of SARS-CoV-2 by FDA under an Emergency Use Authorization (EUA). This EUA will remain in effect (meaning this test can be used) for the duration of the COVID-19 declaration under Section 564(b)(1) of the Act, 21 U.S.C. section 360bbb-3(b)(1), unless the authorization is terminated or revoked.     Resp Syncytial Virus by PCR NEGATIVE NEGATIVE Final    Comment: (NOTE) Fact Sheet for  Patients: EntrepreneurPulse.com.au  Fact Sheet for Healthcare Providers: IncredibleEmployment.be  This test is not yet approved or cleared by the [place] FDA and has been authorized for detection and/or diagnosis of SARS-CoV-2 by FDA under an Emergency Use Authorization (EUA). This EUA will remain in effect (meaning this test can be used) for the duration of the COVID-19 declaration under Section 564(b)(1) of the Act, 21 U.S.C. section 360bbb-3(b)(1), unless the authorization is terminated or revoked.  Performed at [facility], [address], [place], [place] [address]   Surgical pcr screen     Status: None   Collection Time: 07/20/22  4:59 PM   Specimen: Nasal Mucosa; Nasal Swab  Result Value Ref Range Status   MRSA, PCR NEGATIVE NEGATIVE Final   Staphylococcus aureus NEGATIVE NEGATIVE Final    Comment: (NOTE) The Xpert SA Assay (FDA approved for NASAL specimens in patients 78 years of age and older), is one component of a comprehensive surveillance program. It is not intended to diagnose infection nor to guide or monitor treatment. Performed at [facility], [place] [address], [place], [place] [address]   Culture, blood (Routine X 2) w Reflex to ID Panel     Status: None (Preliminary result)   Collection Time: 07/21/22 11:26 AM   Specimen: BLOOD LEFT ARM  Result Value Ref Range Status   Specimen Description BLOOD LEFT ARM  Final   Special Requests   Final    BOTTLES DRAWN AEROBIC AND ANAEROBIC Blood Culture adequate volume   Culture   Final    NO GROWTH < 24 HOURS Performed at [facility], [place] [address], [place], [place] [address]    Report Status PENDING  Incomplete  Culture, blood (Routine X 2) w Reflex to ID Panel     Status: None (Preliminary result)   Collection Time: 07/21/22 11:38 AM   Specimen: BLOOD LEFT HAND  Result Value Ref Range Status   Specimen Description BLOOD LEFT HAND  Final   Special Requests    Final    BOTTLES DRAWN AEROBIC AND ANAEROBIC Blood Culture adequate volume   Culture   Final    NO GROWTH < 24 HOURS Performed at [facility], [place] [address] [place], [place] [address]    Report Status PENDING  Incomplete    RADIOLOGY STUDIES/RESULTS: EEG adult  Result Date: 07/21/2022 Lora Havens, MD     07/21/2022 12:54 PM Patient Name:  CARLEAH BARUT MRN: [id number] Epilepsy Attending: Lora Havens Referring Physician/Provider: Jonetta Osgood, MD Date: 07/21/2022 Duration: 22.25 mins Patient history: 62yo F with ams. EEG to evaluate for seizure Level of alertness: Awake AEDs during EEG study: None Technical aspects: This EEG study was done with scalp electrodes positioned according to the 10-20 International system of electrode placement. Electrical activity was reviewed with band pass filter of 1-'70Hz'$ , sensitivity of 7 uV/mm, display speed of 14m/sec with a '60Hz'$  notched filter applied as appropriate. EEG data were recorded continuously and digitally stored.  Video monitoring was available and reviewed as appropriate. Description: No clear posterior dominant rhythm was seen. EEG showed continuous generalized  3 to 6 Hz theta-delta slowing. Generalized periodic discharges with triphasic morphology were also noted  at  1-1.5 Hz. Hyperventilation and photic stimulation were not performed.   ABNORMALITY - Periodic discharges with triphasic morphology, generalized ( GPDs) - Continuous slow, generalized IMPRESSION: This study showed generalized periodic discharges with triphasic morphology which can be on the ictal-interictal continuum. However, the frequency and morphology is more commonly seen  secondary to toxic-metabolic causes. Additionally there is moderate diffuse encephalopathy. No seizures or definite epileptiform discharges were seen throughout the recording. If concern for seizures persist, consider long term monitoring. Priyanka OBarbra Sarks  DG CHEST PORT 1 VIEW  Result Date:  07/21/2022 CLINICAL DATA:  Tachypnea EXAM: PORTABLE CHEST 1 VIEW COMPARISON:  06/27/2022 FINDINGS: Right mid lung alveolar opacity consistent with atelectasis, consolidation or mass is a stable finding. No pleural effusion. No pneumothorax. Normal pulmonary vasculature. Unremarkable cardiac silhouette. IMPRESSION: Right mid lung alveolar process unchanged. Electronically Signed   By: JSammie BenchM.D.   On: 07/21/2022 10:28   DG THORACOLUMBAR SPINE  Result Date: 07/20/2022 CLINICAL DATA:  Elective surgery EXAM: THORACOLUMBAR SPINE 1V COMPARISON:  Preoperative imaging. FINDINGS: Cross-table lateral views of the thoracic spine obtained in the operating room. 1833 hour: Surgical instrument localizes posteriorly at the level of T10. 1843 hour: Surgical instruments localize posteriorly at the T9 level. IMPRESSION: Cross-table lateral views of the thoracolumbar spine obtained in the operating room for surgical localization. Electronically Signed   By: MKeith RakeM.D.   On: 07/20/2022 20:21     LOS: 3 days   SOren Binet MD  [facility]    To contact the attending provider between 7A-7P or the covering provider during after hours 7P-7A, please log into the web site [email or web address] and access using universal [place] password for that web site. If you do not have the password, please call the hospital operator.  07/22/2022, 10:53 AM

## 2022-07-22 NOTE — Progress Notes (Signed)
PT Cancellation Note  Patient Details Name: EMLYNN DRISKILL MRN: [id number] DOB: 05/12/1961   Cancelled Treatment:    Reason Eval/Treat Not Completed: Medical issues which prohibited therapy; patient had Ativan and on continuous EEG due to seizures.  Will attempt another day.   Reginia Naas 07/22/2022, 12:42 PM Magda Kiel, PT Acute Rehabilitation Services Office:[phone] 07/22/2022

## 2022-07-22 NOTE — Progress Notes (Signed)
Pt Hemovac drain removed per order and protocol. Tip of the drain intact and unremarkable. Clean, sterile dressing applied to removed drain site. Dsg remains clean, dry and intact. 15 ml serosanguinous fluid emptied from drain. Will continue to closely monitor. Delia Heady RN

## 2022-07-22 NOTE — Progress Notes (Signed)
Pt was witnessed to have shaking, tremors to both upper extremities mostly to the right, blank starring, alert but not following command. Pt remains alert and response to name when called. Pt spouse at bedside report pt having hx of seizure and appears to be doing the same thing like 2 years ago. Dr. Sloan Leiter was notified and MD came to the beside, Neurology at bedside ordered '2mg'$  ativan IV to be given and pt administered 2 mg ativan IV. New orders received and pt remains NPO with oral care done. Pt in bed with call light within reach, IV infusing and spouse at bedside. Will continue to closely monitor. Delia Heady RN   07/22/22 0735  Vitals  Temp 98.9 F (37.2 C)  Temp Source Axillary  BP (!) 140/86  MAP (mmHg) 101  BP Location Right Arm  BP Method Automatic  Patient Position (if appropriate) Lying  Pulse Rate 94  Pulse Rate Source Monitor  Resp 20  MEWS COLOR  MEWS Score Color Green  Oxygen Therapy  SpO2 95 %  O2 Device Room Air  MEWS Score  MEWS Temp 0  MEWS Systolic 0  MEWS Pulse 0  MEWS RR 0  MEWS LOC 0  MEWS Score 0

## 2022-07-22 NOTE — Progress Notes (Signed)
OT Cancellation Note  Patient Details Name: Theresa Pace MRN: [id number] DOB: Mar 11, 1961   Cancelled Treatment:    Reason Eval/Treat Not Completed: Patient not medically ready (Pt unable to participate in OT evaluation. Per nursing, pt received Ativan and is on continous EEG for seizures. Will continue to follow patient and complete evaluation when appropriate.)  Ailene Ravel, OTR/L,CBIS  Supplemental OT - [facility] and [facility] Secure Chat Preferred   07/22/2022, 11:33 AM

## 2022-07-22 DEATH — deceased

## 2022-07-23 ENCOUNTER — Inpatient Hospital Stay (HOSPITAL_COMMUNITY): Payer: Medicaid Other

## 2022-07-23 DIAGNOSIS — C7949 Secondary malignant neoplasm of other parts of nervous system: Secondary | ICD-10-CM

## 2022-07-23 DIAGNOSIS — R569 Unspecified convulsions: Secondary | ICD-10-CM | POA: Diagnosis not present

## 2022-07-23 DIAGNOSIS — I10 Essential (primary) hypertension: Secondary | ICD-10-CM | POA: Diagnosis not present

## 2022-07-23 DIAGNOSIS — M4804 Spinal stenosis, thoracic region: Secondary | ICD-10-CM | POA: Diagnosis not present

## 2022-07-23 DIAGNOSIS — C439 Malignant melanoma of skin, unspecified: Secondary | ICD-10-CM

## 2022-07-23 DIAGNOSIS — C7931 Secondary malignant neoplasm of brain: Secondary | ICD-10-CM | POA: Diagnosis not present

## 2022-07-23 DIAGNOSIS — G9520 Unspecified cord compression: Secondary | ICD-10-CM | POA: Diagnosis not present

## 2022-07-23 LAB — CBC
HCT: 28.7 % — ABNORMAL LOW (ref 36.0–46.0)
Hemoglobin: 8.8 g/dL — ABNORMAL LOW (ref 12.0–15.0)
MCH: 29.1 pg (ref 26.0–34.0)
MCHC: 30.7 g/dL (ref 30.0–36.0)
MCV: 95 fL (ref 80.0–100.0)
Platelets: 457 10*3/uL — ABNORMAL HIGH (ref 150–400)
RBC: 3.02 MIL/uL — ABNORMAL LOW (ref 3.87–5.11)
RDW: 17.5 % — ABNORMAL HIGH (ref 11.5–15.5)
WBC: 11.9 10*3/uL — ABNORMAL HIGH (ref 4.0–10.5)
nRBC: 0 % (ref 0.0–0.2)

## 2022-07-23 LAB — COMPREHENSIVE METABOLIC PANEL
ALT: 12 U/L (ref 0–44)
AST: 20 U/L (ref 15–41)
Albumin: 2 g/dL — ABNORMAL LOW (ref 3.5–5.0)
Alkaline Phosphatase: 70 U/L (ref 38–126)
Anion gap: 11 (ref 5–15)
BUN: 27 mg/dL — ABNORMAL HIGH (ref 6–20)
CO2: 19 mmol/L — ABNORMAL LOW (ref 22–32)
Calcium: 7.5 mg/dL — ABNORMAL LOW (ref 8.9–10.3)
Chloride: 113 mmol/L — ABNORMAL HIGH (ref 98–111)
Creatinine, Ser: 0.68 mg/dL (ref 0.44–1.00)
GFR, Estimated: >60 mL/min (ref >60)
Glucose, Bld: 123 mg/dL — ABNORMAL HIGH (ref 70–99)
Potassium: 3.6 mmol/L (ref 3.5–5.1)
Sodium: 143 mmol/L (ref 135–145)
Total Bilirubin: 0.6 mg/dL (ref 0.3–1.2)
Total Protein: 4.9 g/dL — ABNORMAL LOW (ref 6.5–8.1)

## 2022-07-23 LAB — URINE CULTURE: Culture: NO GROWTH

## 2022-07-23 MED ORDER — PANTOPRAZOLE SODIUM 40 MG IV SOLR
40.0000 mg | Freq: Every day | INTRAVENOUS | Status: DC
  Administered 2022-07-23 – 2022-07-25 (×3): 40 mg via INTRAVENOUS
  Filled 2022-07-23 (×3): qty 10

## 2022-07-23 MED ORDER — LORAZEPAM 2 MG/ML IJ SOLN
0.5000 mg | Freq: Four times a day (QID) | INTRAMUSCULAR | Status: DC | PRN
  Administered 2022-07-23 (×2): 0.5 mg via INTRAVENOUS
  Filled 2022-07-23: qty 1

## 2022-07-23 MED ORDER — METOPROLOL TARTRATE 5 MG/5ML IV SOLN
5.0000 mg | Freq: Four times a day (QID) | INTRAVENOUS | Status: DC
  Administered 2022-07-23 – 2022-07-28 (×20): 5 mg via INTRAVENOUS
  Filled 2022-07-23 (×20): qty 5

## 2022-07-23 MED ORDER — GADOBUTROL 1 MMOL/ML IV SOLN
7.0000 mL | Freq: Once | INTRAVENOUS | Status: AC | PRN
  Administered 2022-07-23: 7 mL via INTRAVENOUS

## 2022-07-23 MED ORDER — METOPROLOL TARTRATE 5 MG/5ML IV SOLN
2.5000 mg | INTRAVENOUS | Status: AC | PRN
  Administered 2022-07-24: 2.5 mg via INTRAVENOUS
  Filled 2022-07-23: qty 5

## 2022-07-23 NOTE — Procedures (Signed)
Patient Name: Theresa Pace  MRN: [id number]  Epilepsy Attending: Lora Havens  Referring Physician/Provider: Derek Jack, MD  Duration: 07/22/2022 [id number] to 07/23/2022 [id number]   Patient history: 62yo F with ams. EEG to evaluate for seizure   Level of alertness: lethargic   AEDs during EEG study: LEV   Technical aspects: This EEG study was done with scalp electrodes positioned according to the 10-20 International system of electrode placement. Electrical activity was reviewed with band pass filter of 1-'70Hz'$ , sensitivity of 7 uV/mm, display speed of 79m/sec with a '60Hz'$  notched filter applied as appropriate. EEG data were recorded continuously and digitally stored.  Video monitoring was available and reviewed as appropriate.   Description: No clear posterior dominant rhythm was seen. EEG showed continuous generalized  3 to 6 Hz theta-delta slowing. Generalized periodic discharges with triphasic morphology were also noted with fluctuating frequency of 0.5 to '2hz'$ , predominantly when awake/stimulated. Hyperventilation and photic stimulation were not performed.     Event button was pressed on 07/22/2022 at 1105. Patient was noted to be raising her ams. Concomitant eeg showed generalized periodic discharges with triphasic morphology at 0.5-'1hz'$  without definite evolution.   Event button was pressed on 07/22/2022 at [place] left arm was raised, had tremor like movements. Right amr was flexed and abducted, also with tremor like movements.  Concomitant eeg showed generalized periodic discharges with triphasic morphology at 1-1.'5hz'$  without definite evolution.   Event button was pressed on 07/22/2022 at 2305 for arm movements. Concomitant eeg showed generalized periodic discharges with triphasic morphology at 1-1.'5hz'$  without definite evolution.    ABNORMALITY - Periodic discharges with triphasic morphology, generalized ( GPDs) - Continuous slow, generalized   IMPRESSION: This study showed generalized periodic  discharges with triphasic morphology which can be on the ictal-interictal continuum. However, the frequency, morphology and reactivity to stimulation is more commonly seen  secondary to toxic-metabolic causes like uremia, cefepime toxicity.  Additionally there is moderate to severe diffuse encephalopathy. No seizures were seen throughout the recording.  Multiple events of arm movements were noted as described above without concomitant eeg change. These events were not epileptic.   Gaylon Bentz OBarbra Sarks

## 2022-07-23 NOTE — Progress Notes (Signed)
RN called MRI to inquire about pt's ordered MRI and was notified pt has the MRI compatible EEG as pt's continuous EEG was a concern to MRI. Per MRI, they have pt in line and will try to get pt sometime this am or today. Delia Heady RN

## 2022-07-23 NOTE — Progress Notes (Signed)
  NEUROSURGERY PROGRESS NOTE   No issues overnight.   EXAM:  BP 124/73 (BP Location: Right Arm)   Pulse 80   Temp 98.2 F (36.8 C) (Axillary)   Resp 18   Ht '5\' 2"'$  (1.575 m)   Wt 68 kg   SpO2 94%   BMI 27.42 kg/m   Drowsy but arousable Groans to noxious stimuli, not verbal CN grossly intact  Not following commands, w/d all extremities to pain Wound c/d/i  IMAGING: MRI brain reviewed demonstrating multiple (>15) enhancing metastases both supra- and infra-tentorial. No HCP or significan mass effect  IMPRESSION:  62 y.o. female with metastatic melanoma POD# 3 s/p T8-9 laminectomy for metastatic melanoma. Remains obtunded with MRI revealing multiple brain metastases. Will likely need discussion regarding goals of care. Can get rad onc involved for potential WBRT vs maybe a candidate for SRS.  PLAN: - Cont w/u per neurology, EEG - Will add patient to list for neuro-oncology conference this Monday    Consuella Lose, MD [facility]

## 2022-07-23 NOTE — Progress Notes (Signed)
LTM EEG discontinued - no skin breakdown at unhook.   

## 2022-07-23 NOTE — Progress Notes (Signed)
SLP Cancellation Note  Patient Details Name: Theresa Pace MRN: [id number] DOB: [birth year]   Cancelled treatment:       Reason Eval/Treat Not Completed: Patient's level of consciousness Per RN patient not alert enough to safely participate.  ST will continue efforts to complete swallowing evaluation.   Shelly Flatten, MA, [facility] SLP [phone]  Lamar Sprinkles 07/23/2022, 9:36 AM

## 2022-07-23 NOTE — Progress Notes (Signed)
OT Cancellation Note  Patient Details Name: Theresa Pace MRN: [id number] DOB: [birth year]   Cancelled Treatment:    Reason Eval/Treat Not Completed: Other (comment). Spoke with attending Dr. Sloan Leiter and he wishes for [place] to wait and check on patient tomorrow due to patient is "not waking up".  [place] Office [phone]    Almon Register 07/23/2022, 12:58 PM

## 2022-07-23 NOTE — Progress Notes (Signed)
PT Cancellation Note  Patient Details Name: Theresa Pace MRN: [id number] DOB: 05-11-1961   Cancelled Treatment:    Reason Eval/Treat Not Completed: Medical issues which prohibited therapy remain this afternoon. Spoke with attending Dr. Sloan Leiter and he wishes for [place] to wait and check on patient tomorrow due to patient is "not waking up". Will continue to follow and evaluate as medically appropriate.   West Carbo, PT, DPT   Acute Rehabilitation Department Office [phone] Secure Chat Communication Preferred   Sandra Cockayne 07/23/2022, 1:24 PM

## 2022-07-23 NOTE — Progress Notes (Signed)
PROGRESS NOTE        PATIENT DETAILS Name: Theresa Pace Age: 62 y.o. Sex: female Date of Birth: 11-15-1960 Admit Date: 07/15/2022 Admitting Physician Earnie Larsson, MD [id number], [facility]  Brief Summary: 62 year old with history of metastatic melanoma, COPD, chronic pain syndrome, ADHD-who is currently undergoing immunotherapy for metastatic melanoma-presented to the hospital with worsening back pain and lower extremity weakness-she evaluated with an MRI which showed pathological T8/T9 fractures and epidural extension of the tumor-she was taken to the OR on 2/28 for decompression by neurosurgery. [facility] was consulted on 2/29 for evaluation of confusion-on 3/1-patient was found to have breakthrough seizures.  See below for further details.   Significant events: 2/27>> admit by neurosurgery 2/28>> T8-T9 decompressive laminectomy with resection of tumor 2/29>> consult to [facility] assumed primary service. 3/01>> seizure-like activity-neurology consulted.  Significant studies: 2/27>> CT abdomen/pelvis: Diffuse progressive metastatic disease throughout lower chest/abdomen/pelvis. 2/27>> MRI T-spine: T8-T9 vertebral bodies compression fracture, extraosseous/epidural extension into the ventral epidural space at T9 with resultant severe spinal stenosis/cord compression. 2/27>> MRI L-spine: Multiple osseous mets involving lumbar spine, no extraosseous or epidural extension of tumor. 2/29>> CXR: Right mid lung alveolar process unchanged.  Significant microbiology data: 2/27>> COVID/influenza/RSV PCR: Negative 2/29>> blood culture: No growth  Procedures: 2/28>> T8-T9 decompressive laminectomy with resection of tumor  Consults: Neurology Neurosurgery  Subjective: Minimally verbal but speaking 1-2 words.  Confused.  Continues to have lip smacking movement.  Objective: Vitals: Blood pressure 124/66, pulse (!) 109, temperature 97.9 F (36.6 C), temperature  source Axillary, resp. rate 16, height '5\' 2"'$  (1.575 m), weight 68 kg, SpO2 95 %.   Exam: Gen Exam: Confused-not in any distress HEENT:atraumatic, normocephalic Chest: B/L clear to auscultation anteriorly CVS:S1S2 regular Abdomen:soft non tender, non distended Extremities:no edema Neurology: Difficult exam but seems to be moving all 4 extremities symmetrically. Skin: no rash  Pertinent Labs/Radiology:    Latest Ref Rng & Units 07/23/2022    8:19 AM 07/22/2022    6:52 AM 07/21/2022   11:26 AM  CBC  WBC 4.0 - 10.5 K/uL 11.9  10.7  15.1   Hemoglobin 12.0 - 15.0 g/dL 8.8  9.3  9.8   Hematocrit 36.0 - 46.0 % 28.7  28.9  30.1   Platelets 150 - 400 K/uL 457  465  500     Lab Results  Component Value Date   NA 143 07/23/2022   K 3.6 07/23/2022   CL 113 (H) 07/23/2022   CO2 19 (L) 07/23/2022      Assessment/Plan: Metastatic melanoma with T8/T9 pathologic fracture  Cord compression at T8-T9 level Epidural tumor with myelopathy S/p T8-T9 decompressive laminectomy with resection of tumor on 2/28 History of widespread metastatic melanoma Postoperative care deferred to neurosurgery On Decadron On immunotherapy-oncology at [place] to have appropriate strength in both lower extremities   Acute toxic metabolic encephalopathy Breakthrough seizure Initially thought to be from postop status/narcotics/UTI-however had seizure-like activity on 3/1 Remains significantly confused today Awaiting LTM EEG results Awaiting MRI brain Continue Keppra Await further recommendations from neurology.  Normocytic anemia Likely due to acute illness/underlying malignancy Follow CBC  COPD Not in exacerbation Continue bronchodilators   Complicated UTI IV Rocephin x 3 days  ?  PAF/MAT Mostly sinus tach today  Since n.p.o. due to confusion-will switch to IV beta-blocker until oral intake is resumed. Check echo Not safe  anticoagulation given recent thoracic spine surgery.  HLD Statin    Chronic pain syndrome IV Dilaudid for severe pain, Norco for moderate pain On chronic narcotics at home   HTN Relatively stable Now on IV metoprolol.  ADHD/fibromyalgia/depression Abilify/trazodone Holding Adderall  BMI: Estimated body mass index is 27.42 kg/m as calculated from the following:   Height as of this encounter: '5\' 2"'$  (1.575 m).   Weight as of this encounter: 68 kg.   Code status:   Code Status: Full Code   DVT Prophylaxis: SCD's Start: 07/20/22 2118 SCDs Start: 07/14/2022 2316    Family Communication: Spouse at bedside on 3/1   Disposition Plan: Status is: Inpatient Remains inpatient appropriate because: Severity of illness   Planned Discharge Destination:Skilled nursing facility   Diet: Diet Order             Diet NPO time specified  Diet effective now                     Antimicrobial agents: Anti-infectives (From admission, onward)    Start     Dose/Rate Route Frequency Ordered Stop   07/22/22 0915  cefTRIAXone (ROCEPHIN) 2 g in sodium chloride 0.9 % 100 mL IVPB        2 g 200 mL/hr over 30 Minutes Intravenous Every 24 hours 07/21/22 1016 07/25/22 0914   07/21/22 0915  cefTRIAXone (ROCEPHIN) 1 g in sodium chloride 0.9 % 100 mL IVPB  Status:  Discontinued        1 g 200 mL/hr over 30 Minutes Intravenous Every 24 hours 07/21/22 0821 07/21/22 1016   07/20/22 2215  ceFAZolin (ANCEF) IVPB 1 g/50 mL premix        1 g 100 mL/hr over 30 Minutes Intravenous Every 8 hours 07/20/22 2118 07/21/22 0610   07/20/22 1007  ceFAZolin (ANCEF) IVPB 2g/100 mL premix        2 g 200 mL/hr over 30 Minutes Intravenous 30 min pre-op 07/20/22 1007 07/20/22 1810        MEDICATIONS: Scheduled Meds:  ARIPiprazole  10 mg Oral Daily   baclofen  10 mg Oral TID   dexamethasone (DECADRON) injection  2 mg Intravenous Q12H   docusate sodium  100 mg Oral BID   fluticasone furoate-vilanterol  1 puff Inhalation Daily   And   umeclidinium bromide  1 puff  Inhalation Daily   melatonin  5 mg Oral QHS   metoprolol tartrate  5 mg Intravenous Q6H   pantoprazole (PROTONIX) IV  40 mg Intravenous Daily   rosuvastatin  20 mg Oral QPM   traZODone  150 mg Oral QHS   Continuous Infusions:  sodium chloride 100 mL/hr at 07/22/22 2158   cefTRIAXone (ROCEPHIN)  IV Stopped (07/22/22 0912)   levETIRAcetam 500 mg (07/23/22 0849)   methocarbamol (ROBAXIN) IV Stopped (07/21/22 1120)   PRN Meds:.acetaminophen **OR** acetaminophen, albuterol, bisacodyl, HYDROcodone-acetaminophen, HYDROmorphone (DILAUDID) injection, LORazepam, LORazepam, menthol-cetylpyridinium **OR** phenol, methocarbamol (ROBAXIN) IV, metoprolol tartrate, metoprolol tartrate, naLOXone (NARCAN)  injection, ondansetron (ZOFRAN) IV **OR** ondansetron, polyethylene glycol, sodium phosphate   I have personally reviewed following labs and imaging studies  LABORATORY DATA: CBC: Recent Labs  Lab 07/09/2022 1555 07/20/22 0055 07/21/22 1126 07/22/22 0652 07/23/22 0819  WBC 11.1* 10.1 15.1* 10.7* 11.9*  NEUTROABS 8.6*  --  13.3*  --   --   HGB 11.1* 10.5* 9.8* 9.3* 8.8*  HCT 34.7* 33.4* 30.1* 28.9* 28.7*  MCV 94.0 94.4 94.1 93.2 95.0  PLT 535* 474* 500*  465* 457*     Basic Metabolic Panel: Recent Labs  Lab 07/20/2022 1555 07/20/22 0055 07/21/22 1126 07/22/22 0652 07/23/22 0819  NA 136 137 136 139 143  K 3.6 4.5 4.2 4.0 3.6  CL 101 104 99 105 113*  CO2 '24 24 24 '$ 21* 19*  GLUCOSE 123* 144* 273* 153* 123*  BUN 23* 19 29* 34* 27*  CREATININE 0.54 0.53 0.79 0.85 0.68  CALCIUM 7.6* 7.7* 7.4* 7.3* 7.5*  MG  --   --   --  2.3  --      GFR: Estimated Creatinine Clearance: 67.6 mL/min (by C-G formula based on SCr of 0.68 mg/dL).  Liver Function Tests: Recent Labs  Lab 07/12/2022 1555 07/21/22 1126 07/23/22 0819  AST '17 20 20  '$ ALT '13 12 12  '$ ALKPHOS 95 77 70  BILITOT 0.5 0.4 0.6  PROT 5.8* 5.2* 4.9*  ALBUMIN 2.1* 1.8* 2.0*    No results for input(s): "LIPASE", "AMYLASE" in the  last 168 hours. Recent Labs  Lab 07/22/22 0652  AMMONIA 27     Coagulation Profile: No results for input(s): "INR", "PROTIME" in the last 168 hours.  Cardiac Enzymes: No results for input(s): "CKTOTAL", "CKMB", "CKMBINDEX", "TROPONINI" in the last 168 hours.  BNP (last 3 results) No results for input(s): "PROBNP" in the last 8760 hours.  Lipid Profile: No results for input(s): "CHOL", "HDL", "LDLCALC", "TRIG", "CHOLHDL", "LDLDIRECT" in the last 72 hours.  Thyroid Function Tests: No results for input(s): "TSH", "T4TOTAL", "FREET4", "T3FREE", "THYROIDAB" in the last 72 hours.  Anemia Panel: Recent Labs    07/22/22 0652  VITAMINB12 823     Urine analysis:    Component Value Date/Time   COLORURINE YELLOW 07/21/2022 1311   APPEARANCEUR CLEAR 07/21/2022 1311   LABSPEC >1.030 (H) 07/21/2022 1311   PHURINE 5.5 07/21/2022 1311   GLUCOSEU NEGATIVE 07/21/2022 1311   HGBUR NEGATIVE 07/21/2022 1311   BILIRUBINUR NEGATIVE 07/21/2022 1311   KETONESUR 15 (A) 07/21/2022 1311   PROTEINUR 30 (A) 07/21/2022 1311   UROBILINOGEN 0.2 02/21/2012 0055   NITRITE NEGATIVE 07/21/2022 1311   LEUKOCYTESUR NEGATIVE 07/21/2022 1311    Sepsis Labs: Lactic Acid, Venous    Component Value Date/Time   LATICACIDVEN 1.5 07/21/2022 1416    MICROBIOLOGY: Recent Results (from the past 240 hour(s))  Resp panel by RT-PCR (RSV, Flu A&B, Covid) Anterior Nasal Swab     Status: None   Collection Time: 07/16/2022  5:15 PM   Specimen: Anterior Nasal Swab  Result Value Ref Range Status   SARS Coronavirus 2 by RT PCR NEGATIVE NEGATIVE Final    Comment: (NOTE) SARS-CoV-2 target nucleic acids are NOT DETECTED.  The SARS-CoV-2 RNA is generally detectable in upper respiratory specimens during the acute phase of infection. The lowest concentration of SARS-CoV-2 viral copies this assay can detect is 138 copies/mL. A negative result does not preclude SARS-Cov-2 infection and should not be used as the sole  basis for treatment or other patient management decisions. A negative result may occur with  improper specimen collection/handling, submission of specimen other than nasopharyngeal swab, presence of viral mutation(s) within the areas targeted by this assay, and inadequate number of viral copies(<138 copies/mL). A negative result must be combined with clinical observations, patient history, and epidemiological information. The expected result is Negative.  Fact Sheet for Patients:  EntrepreneurPulse.com.au  Fact Sheet for Healthcare Providers:  IncredibleEmployment.be  This test is no t yet approved or cleared by the [place] FDA and  has  been authorized for detection and/or diagnosis of SARS-CoV-2 by FDA under an Emergency Use Authorization (EUA). This EUA will remain  in effect (meaning this test can be used) for the duration of the COVID-19 declaration under Section 564(b)(1) of the Act, 21 U.S.C.section 360bbb-3(b)(1), unless the authorization is terminated  or revoked sooner.       Influenza A by PCR NEGATIVE NEGATIVE Final   Influenza B by PCR NEGATIVE NEGATIVE Final    Comment: (NOTE) The Xpert Xpress SARS-CoV-2/FLU/RSV plus assay is intended as an aid in the diagnosis of influenza from Nasopharyngeal swab specimens and should not be used as a sole basis for treatment. Nasal washings and aspirates are unacceptable for Xpert Xpress SARS-CoV-2/FLU/RSV testing.  Fact Sheet for Patients: EntrepreneurPulse.com.au  Fact Sheet for Healthcare Providers: IncredibleEmployment.be  This test is not yet approved or cleared by the [place] FDA and has been authorized for detection and/or diagnosis of SARS-CoV-2 by FDA under an Emergency Use Authorization (EUA). This EUA will remain in effect (meaning this test can be used) for the duration of the COVID-19 declaration under Section 564(b)(1) of the Act,  21 U.S.C. section 360bbb-3(b)(1), unless the authorization is terminated or revoked.     Resp Syncytial Virus by PCR NEGATIVE NEGATIVE Final    Comment: (NOTE) Fact Sheet for Patients: EntrepreneurPulse.com.au  Fact Sheet for Healthcare Providers: IncredibleEmployment.be  This test is not yet approved or cleared by the [place] FDA and has been authorized for detection and/or diagnosis of SARS-CoV-2 by FDA under an Emergency Use Authorization (EUA). This EUA will remain in effect (meaning this test can be used) for the duration of the COVID-19 declaration under Section 564(b)(1) of the Act, 21 U.S.C. section 360bbb-3(b)(1), unless the authorization is terminated or revoked.  Performed at [facility], [address] [place], [place] [address]   Surgical pcr screen     Status: None   Collection Time: 07/20/22  4:59 PM   Specimen: Nasal Mucosa; Nasal Swab  Result Value Ref Range Status   MRSA, PCR NEGATIVE NEGATIVE Final   Staphylococcus aureus NEGATIVE NEGATIVE Final    Comment: (NOTE) The Xpert SA Assay (FDA approved for NASAL specimens in patients 78 years of age and older), is one component of a comprehensive surveillance program. It is not intended to diagnose infection nor to guide or monitor treatment. Performed at [facility], [place] [address], [place], [place] [address]   Culture, blood (Routine X 2) w Reflex to ID Panel     Status: None (Preliminary result)   Collection Time: 07/21/22 11:26 AM   Specimen: BLOOD LEFT ARM  Result Value Ref Range Status   Specimen Description BLOOD LEFT ARM  Final   Special Requests   Final    BOTTLES DRAWN AEROBIC AND ANAEROBIC Blood Culture adequate volume   Culture   Final    NO GROWTH < 24 HOURS Performed at [facility], [place] [address], [place], [place] [address]    Report Status PENDING  Incomplete  Culture, blood (Routine X 2) w Reflex to ID Panel     Status: None  (Preliminary result)   Collection Time: 07/21/22 11:38 AM   Specimen: BLOOD LEFT HAND  Result Value Ref Range Status   Specimen Description BLOOD LEFT HAND  Final   Special Requests   Final    BOTTLES DRAWN AEROBIC AND ANAEROBIC Blood Culture adequate volume   Culture   Final    NO GROWTH < 24 HOURS Performed  at [facility], [place] [address], [place], [place] [address]    Report Status PENDING  Incomplete  Urine Culture     Status: None   Collection Time: 07/21/22  1:11 PM   Specimen: Urine, Random  Result Value Ref Range Status   Specimen Description URINE, RANDOM  Final   Special Requests NONE Reflexed from [id number]  Final   Culture   Final    NO GROWTH Performed at [facility], [place] [address] [place], [place] [address]    Report Status 07/23/2022 FINAL  Final    RADIOLOGY STUDIES/RESULTS: Overnight EEG with video  Result Date: 07/23/2022 Lora Havens, MD     07/23/2022  9:33 AM Patient Name: Theresa Pace MRN: [id number] Epilepsy Attending: Lora Havens Referring Physician/Provider: Derek Jack, MD Duration: 07/22/2022 [id number] to 07/23/2022 [id number]  Patient history: 62yo F with ams. EEG to evaluate for seizure  Level of alertness: lethargic  AEDs during EEG study: LEV  Technical aspects: This EEG study was done with scalp electrodes positioned according to the 10-20 International system of electrode placement. Electrical activity was reviewed with band pass filter of 1-'70Hz'$ , sensitivity of 7 uV/mm, display speed of 36m/sec with a '60Hz'$  notched filter applied as appropriate. EEG data were recorded continuously and digitally stored.  Video monitoring was available and reviewed as appropriate.  Description: No clear posterior dominant rhythm was seen. EEG showed continuous generalized  3 to 6 Hz theta-delta slowing. Generalized periodic discharges with triphasic morphology were also noted with fluctuating frequency of 0.5 to '2hz'$ , predominantly when awake/stimulated.  Hyperventilation and photic stimulation were not performed.   Event button was pressed on 07/22/2022 at 1105. Patient was noted to be raising her ams. Concomitant eeg showed generalized periodic discharges with triphasic morphology at 0.5-'1hz'$  without definite evolution. Event button was pressed on 07/22/2022 at [place] left arm was raised, had tremor like movements. Right amr was flexed and abducted, also with tremor like movements.  Concomitant eeg showed generalized periodic discharges with triphasic morphology at 1-1.'5hz'$  without definite evolution. Event button was pressed on 07/22/2022 at 2305 for arm movements. Concomitant eeg showed generalized periodic discharges with triphasic morphology at 1-1.'5hz'$  without definite evolution.  ABNORMALITY - Periodic discharges with triphasic morphology, generalized ( GPDs) - Continuous slow, generalized  IMPRESSION: This study showed generalized periodic discharges with triphasic morphology which can be on the ictal-interictal continuum. However, the frequency, morphology and reactivity to stimulation is more commonly seen  secondary to toxic-metabolic causes like uremia, cefepime toxicity.  Additionally there is moderate to severe diffuse encephalopathy. No seizures were seen throughout the recording. Multiple events of arm movements were noted as described above without concomitant eeg change. These events were not epileptic. PLora Havens  EEG adult  Result Date: 07/21/2022 YLora Havens MD     07/21/2022 12:54 PM Patient Name: Theresa BUNTENMRN: [id number] Attending: PLora HavensReferring Physician/Provider: GJonetta Osgood MD Date: 07/21/2022 Duration: 22.25 mins Patient history: 89yoF with ams. EEG to evaluate for seizure Level of alertness: Awake AEDs during EEG study: None Technical aspects: This EEG study was done with scalp electrodes positioned according to the 10-20 International system of electrode placement. Electrical activity was reviewed  with band pass filter of 1-'70Hz'$ , sensitivity of 7 uV/mm, display speed of 33msec with a '60Hz'$  notched filter applied as appropriate. EEG data were recorded continuously and digitally stored.  Video monitoring was available and reviewed as appropriate. Description:  No clear posterior dominant rhythm was seen. EEG showed continuous generalized  3 to 6 Hz theta-delta slowing. Generalized periodic discharges with triphasic morphology were also noted  at  1-1.5 Hz. Hyperventilation and photic stimulation were not performed.   ABNORMALITY - Periodic discharges with triphasic morphology, generalized ( GPDs) - Continuous slow, generalized IMPRESSION: This study showed generalized periodic discharges with triphasic morphology which can be on the ictal-interictal continuum. However, the frequency and morphology is more commonly seen  secondary to toxic-metabolic causes. Additionally there is moderate diffuse encephalopathy. No seizures or definite epileptiform discharges were seen throughout the recording. If concern for seizures persist, consider long term monitoring. Priyanka Barbra Sarks   DG CHEST PORT 1 VIEW  Result Date: 07/21/2022 CLINICAL DATA:  Tachypnea EXAM: PORTABLE CHEST 1 VIEW COMPARISON:  07/20/2022 FINDINGS: Right mid lung alveolar opacity consistent with atelectasis, consolidation or mass is a stable finding. No pleural effusion. No pneumothorax. Normal pulmonary vasculature. Unremarkable cardiac silhouette. IMPRESSION: Right mid lung alveolar process unchanged. Electronically Signed   By: Sammie Bench M.D.   On: 07/21/2022 10:28     LOS: 4 days   Oren Binet, MD  [facility]    To contact the attending provider between 7A-7P or the covering provider during after hours 7P-7A, please log into the web site [email or web address] and access using universal [place] password for that web site. If you do not have the password, please call the hospital operator.  07/23/2022, 9:38 AM

## 2022-07-24 ENCOUNTER — Inpatient Hospital Stay: Payer: Self-pay

## 2022-07-24 DIAGNOSIS — G9520 Unspecified cord compression: Secondary | ICD-10-CM | POA: Diagnosis not present

## 2022-07-24 DIAGNOSIS — M4804 Spinal stenosis, thoracic region: Secondary | ICD-10-CM | POA: Diagnosis not present

## 2022-07-24 DIAGNOSIS — R569 Unspecified convulsions: Secondary | ICD-10-CM | POA: Diagnosis not present

## 2022-07-24 DIAGNOSIS — I10 Essential (primary) hypertension: Secondary | ICD-10-CM | POA: Diagnosis not present

## 2022-07-24 LAB — BASIC METABOLIC PANEL
Anion gap: 11 (ref 5–15)
BUN: 16 mg/dL (ref 6–20)
CO2: 17 mmol/L — ABNORMAL LOW (ref 22–32)
Calcium: 7.6 mg/dL — ABNORMAL LOW (ref 8.9–10.3)
Chloride: 113 mmol/L — ABNORMAL HIGH (ref 98–111)
Creatinine, Ser: 0.6 mg/dL (ref 0.44–1.00)
GFR, Estimated: >60 mL/min (ref >60)
Glucose, Bld: 104 mg/dL — ABNORMAL HIGH (ref 70–99)
Potassium: 3.8 mmol/L (ref 3.5–5.1)
Sodium: 141 mmol/L (ref 135–145)

## 2022-07-24 LAB — CBC
HCT: 29.4 % — ABNORMAL LOW (ref 36.0–46.0)
Hemoglobin: 9.5 g/dL — ABNORMAL LOW (ref 12.0–15.0)
MCH: 30.1 pg (ref 26.0–34.0)
MCHC: 32.3 g/dL (ref 30.0–36.0)
MCV: 93 fL (ref 80.0–100.0)
Platelets: 383 10*3/uL (ref 150–400)
RBC: 3.16 MIL/uL — ABNORMAL LOW (ref 3.87–5.11)
RDW: 17.2 % — ABNORMAL HIGH (ref 11.5–15.5)
WBC: 12.9 10*3/uL — ABNORMAL HIGH (ref 4.0–10.5)
nRBC: 0 % (ref 0.0–0.2)

## 2022-07-24 MED ORDER — DEXAMETHASONE SODIUM PHOSPHATE 4 MG/ML IJ SOLN
2.0000 mg | Freq: Three times a day (TID) | INTRAMUSCULAR | Status: DC
  Administered 2022-07-24 – 2022-07-27 (×9): 2 mg via INTRAVENOUS
  Filled 2022-07-24 (×9): qty 1

## 2022-07-24 MED ORDER — HYDROMORPHONE HCL 1 MG/ML IJ SOLN
1.0000 mg | INTRAMUSCULAR | Status: DC | PRN
  Administered 2022-07-24 – 2022-07-25 (×3): 1 mg via INTRAVENOUS
  Filled 2022-07-24 (×3): qty 1

## 2022-07-24 MED ORDER — HALOPERIDOL LACTATE 5 MG/ML IJ SOLN
2.0000 mg | Freq: Four times a day (QID) | INTRAMUSCULAR | Status: DC | PRN
  Administered 2022-07-24 – 2022-07-25 (×3): 2 mg via INTRAVENOUS
  Filled 2022-07-24 (×3): qty 1

## 2022-07-24 MED ORDER — MORPHINE SULFATE (PF) 2 MG/ML IV SOLN
1.0000 mg | INTRAVENOUS | Status: DC | PRN
  Administered 2022-07-24: 2 mg via INTRAVENOUS
  Filled 2022-07-24: qty 1

## 2022-07-24 NOTE — Progress Notes (Signed)
Telemetry called to report pt having 8 beats run of SVT. Pt remains irritable but asymptomatic in bed. Dr. Sloan Leiter notified, no new orders, will continue to closely monitor. Pt family remains at bedside. Reported off to oncoming RN. Delia Heady RN

## 2022-07-24 NOTE — Progress Notes (Signed)
  NEUROSURGERY PROGRESS NOTE   No issues overnight.   EXAM:  BP 112/83 (BP Location: Right Arm)   Pulse 100   Temp 98.8 F (37.1 C) (Axillary)   Resp 16   Ht '5\' 2"'$  (1.575 m)   Wt 68 kg   SpO2 97%   BMI 27.42 kg/m   Awake, alert Not following commands CN grossly intact  MAE purposefully Wound c/d/i  IMAGING: MRI brain reviewed demonstrating multiple (>15) enhancing metastases both supra- and infra-tentorial. No HCP or significan mass effect  IMPRESSION:  62 y.o. female with metastatic melanoma POD# 4 s/p T8-9 laminectomy for metastatic melanoma. Somewhat improved from neurologic standpoint today. Will likely need discussion regarding goals of care. Can get rad onc involved for potential WBRT vs maybe a candidate for SRS.  PLAN: - Cont supportive care    Consuella Lose, MD [facility]

## 2022-07-24 NOTE — Progress Notes (Signed)
Dear Doctor: Sloan Leiter  This patient has been identified as a candidate for PICC for the following reason (s): IV therapy over 48 hours, drug pH or osmolality (causing phlebitis, infiltration in 24 hours), drug extravasation potential with tissue necrosis (KCL, Dilantin, Dopamine, CaCl, MgSO4, chemo vesicant), poor veins/poor circulatory system (CHF, COPD, emphysema, diabetes, steroid use, IV drug abuse, etc.), restarts due to phlebitis and infiltration in 24 hours, and incompatible drugs (aminophyllin, TPN, heparin, given with an antibiotic) If you agree, please write an order for the indicated device. For any questions contact the Vascular Access Team at [phone] if no answer, please leave a message.  Thank you for supporting the early vascular access assessment program.

## 2022-07-24 NOTE — Evaluation (Signed)
Clinical/Bedside Swallow Evaluation Patient Details  Name: Theresa Pace MRN: [id number] Date of Birth: 1960-06-17  Today's Date: 07/24/2022 Time: SLP Start Time (ACUTE ONLY): 26 SLP Stop Time (ACUTE ONLY): 1225 SLP Time Calculation (min) (ACUTE ONLY): 12 min  Past Medical History:  Past Medical History:  Diagnosis Date   Arthritis    Asthma    Cancer ([place])    COPD (chronic obstructive pulmonary disease) ([place])    Depression    patient states she is no longer depressed   Fibromyalgia    Frequent urination at night    GERD (gastroesophageal reflux disease)    Hypercholesteremia    IBS (irritable bowel syndrome)    Tachycardia    treated with Cardizem   Tendonitis    Past Surgical History:  Past Surgical History:  Procedure Laterality Date   ABDOMINAL HYSTERECTOMY     APPENDECTOMY     BIOPSY  05/31/2021   Procedure: BIOPSY;  Surgeon: Daneil Dolin, MD;  [facility]: [facility];  Service: Endoscopy;;   COLONOSCOPY  05/2014   2 sessile polyps, 3-7 mm in size (Tubular adenomas) and internal hemorrhoids. diverticulosis   COLONOSCOPY WITH PROPOFOL N/A 05/31/2021   three 4-6 mm polyps, segmental biopsies of colon, normal TI. Tubular adenomas and hyperplastic. Negative colonic biopsies for microscopic colitis. Surveillance in 5 years.   KNEE ARTHROPLASTY Right 05/23/2017   LAMINECTOMY N/A 07/20/2022   Procedure: THORACIC EIGHT-THORACIC NINE LAMINECTOMY FOR RESECTION OF TUMOR;  Surgeon: Earnie Larsson, MD;  Location: [facility];  Service: Neurosurgery;  Laterality: N/A;  T8-T9, available at 4 PM   POLYPECTOMY  05/31/2021   Procedure: POLYPECTOMY;  Surgeon: Daneil Dolin, MD;  Location: [facility];  Service: Endoscopy;;   skin cancer removal from back     HPI:  Pt is a 62 year old who presented with worsening back pain and lower extremity weakness. MRI brain 3/2: Numerous enhancing lesions (16 identified) in the supratentorial and infratentorial brain consistent with metastatic  disease. Mild edema surrounding the larger lesions and some intralesional hemorrhage. 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 Pt was taken to the OR on 2/28 for decompression by neurosurgery. Dx Acute toxic metabolic encephalopathy. PMH: metastatic melanoma, COPD, chronic pain syndrome, ADHD-who is currently undergoing immunotherapy for metastatic melanoma. BSE 12/15/20: WNL; regular/thin recommended. BSE in 2022; regular/thin recommended.    Assessment / Plan / Recommendation  Clinical Impression  Pt was seen for bedside swallow evaluation. She did not communicate verbally and exhibited difficulty following commands. Oral mechanism exam was limited due to pt's difficulty following commands; oral inspection revealed a moist oral mucosa, an edentulous status and continuous lingual thrusting at baseline which continued with solids trials. She presented with symptoms of oropharyngeal dysphagia characterized by absent lingual manipulation/mastication of regular texture solids, and mild oral holding with puree, but no s/s of aspiration were noted despite pt's intake of up to six consecutive swallows of thin liquids via straw. A dysphagia 1 diet with thin liquids is recommended. SLP will follow to ensure tolerance and for possible advancement as clinically indicated. SLP Visit Diagnosis: Dysphagia, unspecified (R13.10)    Aspiration Risk  Mild aspiration risk    Diet Recommendation Dysphagia 1 (Puree);Thin liquid   Liquid Administration via: Cup;Straw Medication Administration: Crushed with puree Supervision: Staff to assist with self feeding Compensations: Slow rate;Small sips/bites Postural Changes: Seated upright at 90 degrees    Other  Recommendations Oral Care Recommendations: Oral care BID  Recommendations for follow up therapy are one component of a multi-disciplinary discharge planning process, led by the attending physician.  Recommendations  may be updated based on patient status, additional functional criteria and insurance authorization.  Follow up Recommendations  (TBD)      Assistance Recommended at Discharge    Functional Status Assessment Patient has had a recent decline in their functional status and demonstrates the ability to make significant improvements in function in a reasonable and predictable amount of time.  Frequency and Duration min 2x/week  2 weeks       Prognosis Prognosis for improved oropharyngeal function: Good      Swallow Study   General Date of Onset: 07/22/22 HPI: Pt is a 62 year old who presented with worsening back pain and lower extremity weakness. MRI brain 3/2: Numerous enhancing lesions (16 identified) in the supratentorial and infratentorial brain consistent with metastatic disease. Mild edema surrounding the larger lesions and some intralesional hemorrhage. 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 Pt was taken to the OR on 2/28 for decompression by neurosurgery. Dx Acute toxic metabolic encephalopathy. PMH: metastatic melanoma, COPD, chronic pain syndrome, ADHD-who is currently undergoing immunotherapy for metastatic melanoma. BSE 12/15/20: WNL; regular/thin recommended. BSE in 2022; regular/thin recommended. Type of Study: Bedside Swallow Evaluation Previous Swallow Assessment: none Diet Prior to this Study: NPO Temperature Spikes Noted: No Respiratory Status: Room air History of Recent Intubation: No Behavior/Cognition: Alert;Doesn't follow directions;Requires cueing;Confused Oral Cavity Assessment: Within Functional Limits Oral Care Completed by SLP: No Oral Cavity - Dentition: Adequate natural dentition Vision: Functional for self-feeding Self-Feeding Abilities: Able to feed self Patient Positioning: Upright in bed;Postural control adequate for testing Baseline Vocal Quality: Not observed Volitional Cough: Cognitively unable to  elicit Volitional Swallow: Unable to elicit    Oral/Motor/Sensory Function Overall Oral Motor/Sensory Function:  (UTA)   Ice Chips Ice chips: Within functional limits Presentation: Spoon   Thin Liquid Thin Liquid: Within functional limits Presentation: Straw;Cup    Nectar Thick Nectar Thick Liquid: Not tested   Honey Thick Honey Thick Liquid: Not tested   Puree Puree: Impaired Presentation: Spoon Oral Phase Impairments: Poor awareness of bolus Oral Phase Functional Implications: Oral holding (lingual thrusting pattern)   Solid     Solid: Impaired Oral Phase Impairments: Poor awareness of bolus Oral Phase Functional Implications: Oral holding     Daisy Mcneel I. Hardin Negus, [place], [place] Office number [phone]  Horton Marshall 07/24/2022,1:21 PM

## 2022-07-24 NOTE — Progress Notes (Signed)
2 VAS RN attempted x2 each to place PICC line.  Only able to thread PICC to possibly mid subclavian region with apparent blockage present.  Recommend IR to place PICC line or CVC to be placed if further access needed.  Pt very agitated and restless during procedure.  RN aware.

## 2022-07-24 NOTE — Progress Notes (Addendum)
PROGRESS NOTE        PATIENT DETAILS Name: Theresa Pace Age: 62 y.o. Sex: female Date of Birth: 12-Nov-1960 Admit Date: 06/29/2022 Admitting Physician Earnie Larsson, MD [id number], [facility]  Brief Summary: 62 year old with history of metastatic melanoma, COPD, chronic pain syndrome, ADHD-who is currently undergoing immunotherapy for metastatic melanoma-presented to the hospital with worsening back pain and lower extremity weakness-she evaluated with an MRI which showed pathological T8/T9 fractures and epidural extension of the tumor-she was taken to the OR on 2/28 for decompression by neurosurgery. TRH was consulted on 2/29 for evaluation of confusion-on 3/1-patient was found to have breakthrough seizures.  See below for further details.   Significant events: 2/27>> admit by neurosurgery 2/28>> T8-T9 decompressive laminectomy with resection of tumor 2/29>> consult to [facility] assumed primary service. 3/01>> seizure-like activity-neurology consulted.  Significant studies: 2/27>> CT abdomen/pelvis: Diffuse progressive metastatic disease throughout lower chest/abdomen/pelvis. 2/27>> MRI T-spine: T8-T9 vertebral bodies compression fracture, extraosseous/epidural extension into the ventral epidural space at T9 with resultant severe spinal stenosis/cord compression. 2/27>> MRI L-spine: Multiple osseous mets involving lumbar spine, no extraosseous or epidural extension of tumor. 2/29>> CXR: Right mid lung alveolar process unchanged. 3/1-3/2>> EEG: No seizures. 3/02>> MRI brain: Numerous enhancing lesions in the supratentorial and infratentorial brain-consistent with metastatic disease.  Significant microbiology data: 2/27>> COVID/influenza/RSV PCR: Negative 2/29>> blood culture: No growth  Procedures: 2/28>> T8-T9 decompressive laminectomy with resection of tumor  Consults: Neurology Neurosurgery  Subjective: Confused-will speak a few words.  No family  at bedside.  Objective: Vitals: Blood pressure (!) 143/82, pulse 88, temperature 98.5 F (36.9 C), temperature source Axillary, resp. rate 18, height '5\' 2"'$  (1.575 m), weight 68 kg, SpO2 97 %.   Exam: Gen Exam:Alert awake-not in any distress HEENT:atraumatic, normocephalic Chest: B/L clear to auscultation anteriorly CVS:S1S2 regular Abdomen:soft non tender, non distended Extremities:no edema Neurology: Difficult exam but moves all 4 extremities. Skin: no rash  Pertinent Labs/Radiology:    Latest Ref Rng & Units 07/23/2022    8:19 AM 07/22/2022    6:52 AM 07/21/2022   11:26 AM  CBC  WBC 4.0 - 10.5 K/uL 11.9  10.7  15.1   Hemoglobin 12.0 - 15.0 g/dL 8.8  9.3  9.8   Hematocrit 36.0 - 46.0 % 28.7  28.9  30.1   Platelets 150 - 400 K/uL 457  465  500     Lab Results  Component Value Date   NA 143 07/23/2022   K 3.6 07/23/2022   CL 113 (H) 07/23/2022   CO2 19 (L) 07/23/2022      Assessment/Plan: Metastatic melanoma with T8/T9 pathologic fracture  Cord compression at T8-T9 level Epidural tumor with myelopathy S/p T8-T9 decompressive laminectomy with resection of tumor on 2/28 Widespread metastatic melanoma with brain mets Postoperative care deferred to neurosurgery On Decadron Moving all 4 extremities but difficult exam given encephalopathy.   Spoke with oncology team at [facility] on 3/2-continue to treat encephalopathy/await physical recovery-before definite prognosis can be provided.  Palliative care team consulted on 3/2.  Long discussion with son/spouse on 3/2. Will d/w rad onc 3/4  Acute toxic metabolic encephalopathy Breakthrough seizure Initially thought to be from postop status/narcotics/UTI-however had seizure-like activity on 3/1 Started on Keppra-no further seizures.  LTM EEG negative.   Unfortunately remains encephalopathic-MRI brain with multiple metastatic lesions with surrounding hematoma and intralesional  hemorrhage. Neurology recommending  continuing [place] Palliative care consulted 3/2 Continue to maintain delirium precautions.  Normocytic anemia Likely due to acute illness/underlying malignancy Follow CBC  COPD Not in exacerbation Continue bronchodilators   Complicated UTI IV Rocephin x 3 days  ?  PAF/MAT Heart rate better today but continues to have episodes of tachycardia Since n.p.o. due to confusion-will switch to IV beta-blocker until oral intake is resumed. Await echo Not safe anticoagulation given recent thoracic spine surgery.  HLD Statin   Chronic pain syndrome IV Dilaudid for severe pain, Norco for moderate pain On chronic narcotics at home   HTN Relatively stable Now on IV metoprolol.  ADHD/fibromyalgia/depression Abilify/trazodone Holding Adderall  BMI: Estimated body mass index is 27.42 kg/m as calculated from the following:   Height as of this encounter: '5\' 2"'$  (1.575 m).   Weight as of this encounter: 68 kg.   Code status:   Code Status: Full Code   DVT Prophylaxis: SCD's Start: 07/20/22 2118 SCDs Start: 07/16/2022 2316    Family Communication: Son/spouse on 3/2.Left VM for spouse on 3/[phone]    Disposition Plan: Status is: Inpatient Remains inpatient appropriate because: Severity of illness   Planned Discharge Destination:Skilled nursing facility   Diet: Diet Order             Diet NPO time specified  Diet effective now                     Antimicrobial agents: Anti-infectives (From admission, onward)    Start     Dose/Rate Route Frequency Ordered Stop   07/22/22 0915  cefTRIAXone (ROCEPHIN) 2 g in sodium chloride 0.9 % 100 mL IVPB        2 g 200 mL/hr over 30 Minutes Intravenous Every 24 hours 07/21/22 1016 07/25/22 0914   07/21/22 0915  cefTRIAXone (ROCEPHIN) 1 g in sodium chloride 0.9 % 100 mL IVPB  Status:  Discontinued        1 g 200 mL/hr over 30 Minutes Intravenous Every 24 hours 07/21/22 0821 07/21/22 1016   07/20/22 2215  ceFAZolin  (ANCEF) IVPB 1 g/50 mL premix        1 g 100 mL/hr over 30 Minutes Intravenous Every 8 hours 07/20/22 2118 07/21/22 0610   07/20/22 1007  ceFAZolin (ANCEF) IVPB 2g/100 mL premix        2 g 200 mL/hr over 30 Minutes Intravenous 30 min pre-op 07/20/22 1007 07/20/22 1810        MEDICATIONS: Scheduled Meds:  ARIPiprazole  10 mg Oral Daily   baclofen  10 mg Oral TID   dexamethasone (DECADRON) injection  2 mg Intravenous Q12H   docusate sodium  100 mg Oral BID   fluticasone furoate-vilanterol  1 puff Inhalation Daily   And   umeclidinium bromide  1 puff Inhalation Daily   melatonin  5 mg Oral QHS   metoprolol tartrate  5 mg Intravenous Q6H   pantoprazole (PROTONIX) IV  40 mg Intravenous Daily   rosuvastatin  20 mg Oral QPM   traZODone  150 mg Oral QHS   Continuous Infusions:  sodium chloride 100 mL/hr at 07/24/22 0729   cefTRIAXone (ROCEPHIN)  IV Stopped (07/23/22 1210)   levETIRAcetam Stopped (07/23/22 2200)   methocarbamol (ROBAXIN) IV Stopped (07/23/22 1836)   PRN Meds:.acetaminophen **OR** acetaminophen, albuterol, bisacodyl, HYDROcodone-acetaminophen, HYDROmorphone (DILAUDID) injection, LORazepam, LORazepam, menthol-cetylpyridinium **OR** phenol, methocarbamol (ROBAXIN) IV, metoprolol tartrate, naLOXone (NARCAN)  injection, ondansetron (ZOFRAN) IV **OR** ondansetron, polyethylene glycol, sodium phosphate  I have personally reviewed following labs and imaging studies  LABORATORY DATA: CBC: Recent Labs  Lab 06/27/2022 1555 07/20/22 0055 07/21/22 1126 07/22/22 0652 07/23/22 0819  WBC 11.1* 10.1 15.1* 10.7* 11.9*  NEUTROABS 8.6*  --  13.3*  --   --   HGB 11.1* 10.5* 9.8* 9.3* 8.8*  HCT 34.7* 33.4* 30.1* 28.9* 28.7*  MCV 94.0 94.4 94.1 93.2 95.0  PLT 535* 474* 500* 465* 457*     Basic Metabolic Panel: Recent Labs  Lab 07/02/2022 1555 07/20/22 0055 07/21/22 1126 07/22/22 0652 07/23/22 0819  NA 136 137 136 139 143  K 3.6 4.5 4.2 4.0 3.6  CL 101 104 99 105 113*   CO2 '24 24 24 '$ 21* 19*  GLUCOSE 123* 144* 273* 153* 123*  BUN 23* 19 29* 34* 27*  CREATININE 0.54 0.53 0.79 0.85 0.68  CALCIUM 7.6* 7.7* 7.4* 7.3* 7.5*  MG  --   --   --  2.3  --      GFR: Estimated Creatinine Clearance: 67.6 mL/min (by C-G formula based on SCr of 0.68 mg/dL).  Liver Function Tests: Recent Labs  Lab 07/07/2022 1555 07/21/22 1126 07/23/22 0819  AST '17 20 20  '$ ALT '13 12 12  '$ ALKPHOS 95 77 70  BILITOT 0.5 0.4 0.6  PROT 5.8* 5.2* 4.9*  ALBUMIN 2.1* 1.8* 2.0*    No results for input(s): "LIPASE", "AMYLASE" in the last 168 hours. Recent Labs  Lab 07/22/22 0652  AMMONIA 27     Coagulation Profile: No results for input(s): "INR", "PROTIME" in the last 168 hours.  Cardiac Enzymes: No results for input(s): "CKTOTAL", "CKMB", "CKMBINDEX", "TROPONINI" in the last 168 hours.  BNP (last 3 results) No results for input(s): "PROBNP" in the last 8760 hours.  Lipid Profile: No results for input(s): "CHOL", "HDL", "LDLCALC", "TRIG", "CHOLHDL", "LDLDIRECT" in the last 72 hours.  Thyroid Function Tests: No results for input(s): "TSH", "T4TOTAL", "FREET4", "T3FREE", "THYROIDAB" in the last 72 hours.  Anemia Panel: Recent Labs    07/22/22 0652  VITAMINB12 823     Urine analysis:    Component Value Date/Time   COLORURINE YELLOW 07/21/2022 1311   APPEARANCEUR CLEAR 07/21/2022 1311   LABSPEC >1.030 (H) 07/21/2022 1311   PHURINE 5.5 07/21/2022 1311   GLUCOSEU NEGATIVE 07/21/2022 1311   HGBUR NEGATIVE 07/21/2022 1311   BILIRUBINUR NEGATIVE 07/21/2022 1311   KETONESUR 15 (A) 07/21/2022 1311   PROTEINUR 30 (A) 07/21/2022 1311   UROBILINOGEN 0.2 02/21/2012 0055   NITRITE NEGATIVE 07/21/2022 1311   LEUKOCYTESUR NEGATIVE 07/21/2022 1311    Sepsis Labs: Lactic Acid, Venous    Component Value Date/Time   LATICACIDVEN 1.5 07/21/2022 1416    MICROBIOLOGY: Recent Results (from the past 240 hour(s))  Resp panel by RT-PCR (RSV, Flu A&B, Covid) Anterior Nasal  Swab     Status: None   Collection Time: 06/30/2022  5:15 PM   Specimen: Anterior Nasal Swab  Result Value Ref Range Status   SARS Coronavirus 2 by RT PCR NEGATIVE NEGATIVE Final    Comment: (NOTE) SARS-CoV-2 target nucleic acids are NOT DETECTED.  The SARS-CoV-2 RNA is generally detectable in upper respiratory specimens during the acute phase of infection. The lowest concentration of SARS-CoV-2 viral copies this assay can detect is 138 copies/mL. A negative result does not preclude SARS-Cov-2 infection and should not be used as the sole basis for treatment or other patient management decisions. A negative result may occur with  improper specimen collection/handling, submission of specimen other  than nasopharyngeal swab, presence of viral mutation(s) within the areas targeted by this assay, and inadequate number of viral copies(<138 copies/mL). A negative result must be combined with clinical observations, patient history, and epidemiological information. The expected result is Negative.  Fact Sheet for Patients:  EntrepreneurPulse.com.au  Fact Sheet for Healthcare Providers:  IncredibleEmployment.be  This test is no t yet approved or cleared by the [place] FDA and  has been authorized for detection and/or diagnosis of SARS-CoV-2 by FDA under an Emergency Use Authorization (EUA). This EUA will remain  in effect (meaning this test can be used) for the duration of the COVID-19 declaration under Section 564(b)(1) of the Act, 21 U.S.C.section 360bbb-3(b)(1), unless the authorization is terminated  or revoked sooner.       Influenza A by PCR NEGATIVE NEGATIVE Final   Influenza B by PCR NEGATIVE NEGATIVE Final    Comment: (NOTE) The Xpert Xpress SARS-CoV-2/FLU/RSV plus assay is intended as an aid in the diagnosis of influenza from Nasopharyngeal swab specimens and should not be used as a sole basis for treatment. Nasal washings and aspirates  are unacceptable for Xpert Xpress SARS-CoV-2/FLU/RSV testing.  Fact Sheet for Patients: EntrepreneurPulse.com.au  Fact Sheet for Healthcare Providers: IncredibleEmployment.be  This test is not yet approved or cleared by the [place] FDA and has been authorized for detection and/or diagnosis of SARS-CoV-2 by FDA under an Emergency Use Authorization (EUA). This EUA will remain in effect (meaning this test can be used) for the duration of the COVID-19 declaration under Section 564(b)(1) of the Act, 21 U.S.C. section 360bbb-3(b)(1), unless the authorization is terminated or revoked.     Resp Syncytial Virus by PCR NEGATIVE NEGATIVE Final    Comment: (NOTE) Fact Sheet for Patients: EntrepreneurPulse.com.au  Fact Sheet for Healthcare Providers: IncredibleEmployment.be  This test is not yet approved or cleared by the [place] FDA and has been authorized for detection and/or diagnosis of SARS-CoV-2 by FDA under an Emergency Use Authorization (EUA). This EUA will remain in effect (meaning this test can be used) for the duration of the COVID-19 declaration under Section 564(b)(1) of the Act, 21 U.S.C. section 360bbb-3(b)(1), unless the authorization is terminated or revoked.  Performed at [facility], [address], [place], [place] [address]   Surgical pcr screen     Status: None   Collection Time: 07/20/22  4:59 PM   Specimen: Nasal Mucosa; Nasal Swab  Result Value Ref Range Status   MRSA, PCR NEGATIVE NEGATIVE Final   Staphylococcus aureus NEGATIVE NEGATIVE Final    Comment: (NOTE) The Xpert SA Assay (FDA approved for NASAL specimens in patients 46 years of age and older), is one component of a comprehensive surveillance program. It is not intended to diagnose infection nor to guide or monitor treatment. Performed at [facility], [place] [address], [place], [place] [address]   Culture,  blood (Routine X 2) w Reflex to ID Panel     Status: None (Preliminary result)   Collection Time: 07/21/22 11:26 AM   Specimen: BLOOD LEFT ARM  Result Value Ref Range Status   Specimen Description BLOOD LEFT ARM  Final   Special Requests   Final    BOTTLES DRAWN AEROBIC AND ANAEROBIC Blood Culture adequate volume   Culture   Final    NO GROWTH 3 DAYS Performed at [facility], [address] [place],  [address]    Report Status PENDING  Incomplete  Culture, blood (Routine X 2) w Reflex to  ID Panel     Status: None (Preliminary result)   Collection Time: 07/21/22 11:38 AM   Specimen: BLOOD LEFT HAND  Result Value Ref Range Status   Specimen Description BLOOD LEFT HAND  Final   Special Requests   Final    BOTTLES DRAWN AEROBIC AND ANAEROBIC Blood Culture adequate volume   Culture   Final    NO GROWTH 3 DAYS Performed at [facility], [address] [place] [address], [place], [place] [address]    Report Status PENDING  Incomplete  Urine Culture     Status: None   Collection Time: 07/21/22  1:11 PM   Specimen: Urine, Random  Result Value Ref Range Status   Specimen Description URINE, RANDOM  Final   Special Requests NONE Reflexed from [id number]  Final   Culture   Final    NO GROWTH Performed at [facility], [place] [address] [place], [place] [address]    Report Status 07/23/2022 FINAL  Final    RADIOLOGY STUDIES/RESULTS: MR BRAIN W WO CONTRAST  Result Date: 07/23/2022 CLINICAL DATA:  Neuro deficit, stroke suspected, seizure disorder. EXAM: MRI HEAD WITHOUT AND WITH CONTRAST TECHNIQUE: Multiplanar, multiecho pulse sequences of the brain and surrounding structures were obtained without and with intravenous contrast. CONTRAST:  40m GADAVIST GADOBUTROL 1 MMOL/ML IV SOLN COMPARISON:  Brain MRI 12/15/2020 FINDINGS: Brain: There are numerous enhancing lesions in the brain most suspicious for metastatic disease: *0.7 cm x 0.6 cm lesion in the right middle frontal gyrus with mild  surrounding edema (11-96). *0.9 cm x 0.7 cm lesion in the right corona radiata with mild surrounding edema and intralesional hemorrhage (11-91). *Punctate lesion in the right cingulate gyrus (11-93). *0.7 cm x 0.6 cm lesion in the right aspect of the splenium of the corpus callosum (11-87). *0.9 cm x 0.9 cm lesion in the right frontal lobe white matter anteriorly with mild surrounding edema and intralesional hemorrhage (11-76). *0.4 cm lesion in the right anteroinferior frontal lobe with mild surrounding edema (11-63). *0.3 cm lesion in the right temporal lobe cortex inferiorly (11-44). *0.6 cm x 0.6 cm lesion in the right anterior temporal lobe with mild surrounding edema and intralesional hemorrhage (11-37). *Ill-defined 0.3 cm lesion in the left frontal opercular white matter (11-71). *1.1 cm x 1.0 cm lesion in the left temporal lobe laterally with mild surrounding edema and intralesional hemorrhage (11-61). *0.4 cm x 0.4 cm lesion in the left temporal lobe cortex anterolaterally (11-37). *2.4 cm x 1.6 cm lesion in the right cerebellar hemisphere with surrounding edema and intralesional hemorrhage (11-33). *1.3 cm x 0.8 cm lesion in the left cerebellar hemisphere with mild surrounding edema (11-36). *0.5 cm lesion in the inferomedial left cerebellar hemisphere (11-19). *Additional punctate lesions in the bilateral cerebellar hemispheres (11-17, 18). *No definite leptomeningeal disease is seen. *There is no significant mass effect caused by the above-described lesions or midline shift. There is no evidence of acute infarct. There is no acute intracranial hemorrhage or extra-axial fluid collection, aside from the intralesional hemorrhage described above. The pituitary and suprasellar region are normal. Vascular: Normal flow voids. Skull and upper cervical spine: Normal marrow signal. Sinuses/Orbits: The paranasal sinuses are clear. The globes and orbits are unremarkable. Other: There are solid lesions in the soft  tissues of the upper neck measuring up to 2.0 cm x 1.5 cm on the right (3-6), 1.7 cm x 1.3 cm just to the right of midline posteriorly (3-9), and 1.2 cm x 1.1 cm to the left  of midline (3-16), suspicious for pathologic lymph nodes. IMPRESSION: 1. Numerous enhancing lesions in the supratentorial and infratentorial brain as detailed above consistent with metastatic disease. There is mild edema surrounding the larger lesions and some intralesional hemorrhage but no significant mass effect or midline shift. At least 16 lesions are identified. 2. Solid nodules in the soft tissues of the imaged upper neck are indeterminate but suspicious for metastatic lymphadenopathy. Dedicated CT neck could be considered for better evaluation. Electronically Signed   By: Valetta Mole M.D.   On: 07/23/2022 11:18   Overnight EEG with video  Result Date: 07/23/2022 Lora Havens, MD     07/23/2022  9:33 AM Patient Name: PUALANI BOGGESS MRN: [id number] Epilepsy Attending: Lora Havens Referring Physician/Provider: Derek Jack, MD Duration: 07/22/2022 [id number] to 07/23/2022 [id number]  Patient history: 62yo F with ams. EEG to evaluate for seizure  Level of alertness: lethargic  AEDs during EEG study: LEV  Technical aspects: This EEG study was done with scalp electrodes positioned according to the 10-20 International system of electrode placement. Electrical activity was reviewed with band pass filter of 1-'70Hz'$ , sensitivity of 7 uV/mm, display speed of 66m/sec with a '60Hz'$  notched filter applied as appropriate. EEG data were recorded continuously and digitally stored.  Video monitoring was available and reviewed as appropriate.  Description: No clear posterior dominant rhythm was seen. EEG showed continuous generalized  3 to 6 Hz theta-delta slowing. Generalized periodic discharges with triphasic morphology were also noted with fluctuating frequency of 0.5 to '2hz'$ , predominantly when awake/stimulated. Hyperventilation and photic stimulation were  not performed.   Event button was pressed on 07/22/2022 at 1105. Patient was noted to be raising her ams. Concomitant eeg showed generalized periodic discharges with triphasic morphology at 0.5-'1hz'$  without definite evolution. Event button was pressed on 07/22/2022 at [place] left arm was raised, had tremor like movements. Right amr was flexed and abducted, also with tremor like movements.  Concomitant eeg showed generalized periodic discharges with triphasic morphology at 1-1.'5hz'$  without definite evolution. Event button was pressed on 07/22/2022 at 2305 for arm movements. Concomitant eeg showed generalized periodic discharges with triphasic morphology at 1-1.'5hz'$  without definite evolution.  ABNORMALITY - Periodic discharges with triphasic morphology, generalized ( GPDs) - Continuous slow, generalized  IMPRESSION: This study showed generalized periodic discharges with triphasic morphology which can be on the ictal-interictal continuum. However, the frequency, morphology and reactivity to stimulation is more commonly seen  secondary to toxic-metabolic causes like uremia, cefepime toxicity.  Additionally there is moderate to severe diffuse encephalopathy. No seizures were seen throughout the recording. Multiple events of arm movements were noted as described above without concomitant eeg change. These events were not epileptic. PMount [place]    LOS: 5 days   SOren Binet MD  [facility]    To contact the attending provider between 7A-7P or the covering provider during after hours 7P-7A, please log into the web site [email or web address] and access using universal [place] password for that web site. If you do not have the password, please call the hospital operator.  07/24/2022, 9:12 AM

## 2022-07-24 NOTE — Progress Notes (Signed)
Neurology progress note  S: Patient is profoundly altered, not following commands. Oldest son is at bedside. MRI brain wwo shows extensive (16) metastatic lesions.  O:  Vitals:   07/24/22 0310 07/24/22 0546  BP: (!) 166/96   Pulse: (!) 138 88  Resp: (!) 26   Temp: 98.5 F (36.9 C)   SpO2: 97%    Gen: awake, lethargic, does not respond to orientation questions, does not follow commands CV: RRR Pulm: CTAB  MS: awake, lethargic, does not respond to orientation questions, does not follow commands Speech: severe dysarthria, no intelligible speech CN: PERRL, EOMI, face symmetric Motor & sensory: spontaneous movement BUE against gravity, withdraws BLE to noxious stimuli Coordination, gait: UTA  Data  MRI brain wwo  FINDINGS  There are numerous enhancing lesions in the brain most suspicious for metastatic disease:   *0.7 cm x 0.6 cm lesion in the right middle frontal gyrus with mild surrounding edema (11-96). *0.9 cm x 0.7 cm lesion in the right corona radiata with mild surrounding edema and intralesional hemorrhage (11-91). *Punctate lesion in the right cingulate gyrus (11-93). *0.7 cm x 0.6 cm lesion in the right aspect of the splenium of the corpus callosum (11-87). *0.9 cm x 0.9 cm lesion in the right frontal lobe white matter anteriorly with mild surrounding edema and intralesional hemorrhage (11-76). *0.4 cm lesion in the right anteroinferior frontal lobe with mild surrounding edema (11-63). *0.3 cm lesion in the right temporal lobe cortex inferiorly (11-44). *0.6 cm x 0.6 cm lesion in the right anterior temporal lobe with mild surrounding edema and intralesional hemorrhage (11-37). *Ill-defined 0.3 cm lesion in the left frontal opercular white matter (11-71). *1.1 cm x 1.0 cm lesion in the left temporal lobe laterally with mild surrounding edema and intralesional hemorrhage (11-61). *0.4 cm x 0.4 cm lesion in the left temporal lobe cortex anterolaterally  (11-37). *2.4 cm x 1.6 cm lesion in the right cerebellar hemisphere with surrounding edema and intralesional hemorrhage (11-33). *1.3 cm x 0.8 cm lesion in the left cerebellar hemisphere with mild surrounding edema (11-36). *0.5 cm lesion in the inferomedial left cerebellar hemisphere (11-19). *Additional punctate lesions in the bilateral cerebellar hemispheres (11-17, 18). *No definite leptomeningeal disease is seen. *There is no significant mass effect caused by the above-described lesions or midline shift.   There is no evidence of acute infarct. There is no acute intracranial hemorrhage or extra-axial fluid collection, aside from the intralesional hemorrhage described above. The pituitary and suprasellar region are normal.   Vascular: Normal flow voids.   Skull and upper cervical spine: Normal marrow signal.   Sinuses/Orbits: The paranasal sinuses are clear. The globes and orbits are unremarkable.   Other: There are solid lesions in the soft tissues of the upper neck measuring up to 2.0 cm x 1.5 cm on the right (3-6), 1.7 cm x 1.3 cm just to the right of midline posteriorly (3-9), and 1.2 cm x 1.1 cm to the left of midline (3-16), suspicious for pathologic lymph nodes.   IMPRESSION: 1. Numerous enhancing lesions in the supratentorial and infratentorial brain as detailed above consistent with metastatic disease. There is mild edema surrounding the larger lesions and some intralesional hemorrhage but no significant mass effect or midline shift. At least 16 lesions are identified. 2. Solid nodules in the soft tissues of the imaged upper neck are indeterminate but suspicious for metastatic lymphadenopathy. Dedicated CT neck could be considered for better evaluation.  CNS imaging personally reviewed; I agree with above interpretation  EEG 3/1-07/23/22  ABNORMALITY - Periodic discharges with triphasic morphology, generalized ( GPDs) - Continuous slow, generalized    IMPRESSION: This study showed generalized periodic discharges with triphasic morphology which can be on the ictal-interictal continuum. However, the frequency, morphology and reactivity to stimulation is more commonly seen  secondary to toxic-metabolic causes like uremia, cefepime toxicity.  Additionally there is moderate to severe diffuse encephalopathy. No seizures were seen throughout the recording.   Multiple events of arm movements were noted as described above without concomitant eeg change. These events were not epileptic.   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  She was started on Keppra with resolution of clinical and electrographic seizures.  MRI brain with and without contrast showed extensive metastatic disease in the brain with at least 16 lesions some of which with surrounding edema and intralesional hemorrhage.  Prognosis is terminal and consult with palliative care is recommended if family is amenable.  Neurology will be available as needed for questions going forward.  She should be maintained on Keppra 500 mg twice daily indefinitely for palliative seizure control.  If she has further seizures this may be increased to 1000 mg twice daily.  Su Monks, MD [facility] [phone]  If 7pm- 7am, please page neurology on call as listed in [place].

## 2022-07-25 ENCOUNTER — Inpatient Hospital Stay (HOSPITAL_COMMUNITY): Payer: Medicaid Other

## 2022-07-25 ENCOUNTER — Inpatient Hospital Stay: Payer: Medicaid Other | Attending: Neurosurgery

## 2022-07-25 DIAGNOSIS — C7931 Secondary malignant neoplasm of brain: Secondary | ICD-10-CM | POA: Diagnosis not present

## 2022-07-25 DIAGNOSIS — M4804 Spinal stenosis, thoracic region: Secondary | ICD-10-CM | POA: Diagnosis not present

## 2022-07-25 DIAGNOSIS — I4891 Unspecified atrial fibrillation: Secondary | ICD-10-CM | POA: Diagnosis not present

## 2022-07-25 DIAGNOSIS — R569 Unspecified convulsions: Secondary | ICD-10-CM | POA: Diagnosis not present

## 2022-07-25 DIAGNOSIS — Z515 Encounter for palliative care: Secondary | ICD-10-CM | POA: Diagnosis not present

## 2022-07-25 DIAGNOSIS — I10 Essential (primary) hypertension: Secondary | ICD-10-CM | POA: Diagnosis not present

## 2022-07-25 DIAGNOSIS — Z7189 Other specified counseling: Secondary | ICD-10-CM | POA: Diagnosis not present

## 2022-07-25 DIAGNOSIS — G9520 Unspecified cord compression: Secondary | ICD-10-CM | POA: Diagnosis not present

## 2022-07-25 LAB — ECHOCARDIOGRAM COMPLETE
Area-P 1/2: 4.58 cm2
Calc EF: 56.7 %
Height: 62 in
S' Lateral: 3.2 cm
Single Plane A2C EF: 52.7 %
Single Plane A4C EF: 61.3 %
Weight: 2398.6 oz

## 2022-07-25 LAB — CBC
HCT: 27.2 % — ABNORMAL LOW (ref 36.0–46.0)
Hemoglobin: 8.9 g/dL — ABNORMAL LOW (ref 12.0–15.0)
MCH: 30 pg (ref 26.0–34.0)
MCHC: 32.7 g/dL (ref 30.0–36.0)
MCV: 91.6 fL (ref 80.0–100.0)
Platelets: 471 10*3/uL — ABNORMAL HIGH (ref 150–400)
RBC: 2.97 MIL/uL — ABNORMAL LOW (ref 3.87–5.11)
RDW: 17.2 % — ABNORMAL HIGH (ref 11.5–15.5)
WBC: 18.9 10*3/uL — ABNORMAL HIGH (ref 4.0–10.5)
nRBC: 0 % (ref 0.0–0.2)

## 2022-07-25 LAB — COMPREHENSIVE METABOLIC PANEL
ALT: 16 U/L (ref 0–44)
AST: 24 U/L (ref 15–41)
Albumin: 2.2 g/dL — ABNORMAL LOW (ref 3.5–5.0)
Alkaline Phosphatase: 72 U/L (ref 38–126)
Anion gap: 10 (ref 5–15)
BUN: 11 mg/dL (ref 6–20)
CO2: 21 mmol/L — ABNORMAL LOW (ref 22–32)
Calcium: 7.7 mg/dL — ABNORMAL LOW (ref 8.9–10.3)
Chloride: 104 mmol/L (ref 98–111)
Creatinine, Ser: 0.45 mg/dL (ref 0.44–1.00)
GFR, Estimated: >60 mL/min (ref >60)
Glucose, Bld: 154 mg/dL — ABNORMAL HIGH (ref 70–99)
Potassium: 3.2 mmol/L — ABNORMAL LOW (ref 3.5–5.1)
Sodium: 135 mmol/L (ref 135–145)
Total Bilirubin: 0.6 mg/dL (ref 0.3–1.2)
Total Protein: 5.3 g/dL — ABNORMAL LOW (ref 6.5–8.1)

## 2022-07-25 LAB — MAGNESIUM
Magnesium: 1.9 mg/dL (ref 1.7–2.4)
Magnesium: 1.7 mg/dL (ref 1.7–2.4)

## 2022-07-25 LAB — PHOSPHORUS: Phosphorus: 1.6 mg/dL — ABNORMAL LOW (ref 2.5–4.6)

## 2022-07-25 LAB — GLUCOSE, CAPILLARY
Glucose-Capillary: 233 mg/dL — ABNORMAL HIGH (ref 70–99)
Glucose-Capillary: 234 mg/dL — ABNORMAL HIGH (ref 70–99)

## 2022-07-25 MED ORDER — PERFLUTREN LIPID MICROSPHERE
1.0000 mL | INTRAVENOUS | Status: AC | PRN
  Administered 2022-07-25: 2 mL via INTRAVENOUS

## 2022-07-25 MED ORDER — HYDROMORPHONE HCL 1 MG/ML IJ SOLN
1.0000 mg | INTRAMUSCULAR | Status: DC | PRN
  Administered 2022-07-25: 2 mg via INTRAVENOUS
  Filled 2022-07-25: qty 2

## 2022-07-25 MED ORDER — BUDESONIDE 0.25 MG/2ML IN SUSP
0.2500 mg | Freq: Two times a day (BID) | RESPIRATORY_TRACT | Status: DC
  Administered 2022-07-25 – 2022-07-28 (×7): 0.25 mg via RESPIRATORY_TRACT
  Filled 2022-07-25 (×7): qty 2

## 2022-07-25 MED ORDER — SENNOSIDES-DOCUSATE SODIUM 8.6-50 MG PO TABS
2.0000 | ORAL_TABLET | Freq: Every day | ORAL | Status: DC
  Administered 2022-07-25 – 2022-07-27 (×3): 2 via ORAL
  Filled 2022-07-25 (×3): qty 2

## 2022-07-25 MED ORDER — POTASSIUM PHOSPHATES 15 MMOLE/5ML IV SOLN
30.0000 mmol | Freq: Once | INTRAVENOUS | Status: AC
  Administered 2022-07-25: 30 mmol via INTRAVENOUS
  Filled 2022-07-25: qty 10

## 2022-07-25 MED ORDER — REVEFENACIN 175 MCG/3ML IN SOLN
175.0000 ug | Freq: Every day | RESPIRATORY_TRACT | Status: DC
  Administered 2022-07-25 – 2022-07-28 (×4): 175 ug via RESPIRATORY_TRACT
  Filled 2022-07-25 (×4): qty 3

## 2022-07-25 MED ORDER — POLYETHYLENE GLYCOL 3350 17 G PO PACK
17.0000 g | PACK | Freq: Every day | ORAL | Status: DC
  Administered 2022-07-25 – 2022-07-28 (×4): 17 g via ORAL
  Filled 2022-07-25 (×4): qty 1

## 2022-07-25 MED ORDER — POTASSIUM CHLORIDE 20 MEQ PO PACK
40.0000 meq | PACK | Freq: Once | ORAL | Status: AC
  Administered 2022-07-25: 40 meq
  Filled 2022-07-25: qty 2

## 2022-07-25 MED ORDER — ARFORMOTEROL TARTRATE 15 MCG/2ML IN NEBU
15.0000 ug | INHALATION_SOLUTION | Freq: Two times a day (BID) | RESPIRATORY_TRACT | Status: DC
  Administered 2022-07-25 – 2022-07-28 (×7): 15 ug via RESPIRATORY_TRACT
  Filled 2022-07-25 (×7): qty 2

## 2022-07-25 MED ORDER — PANTOPRAZOLE SODIUM 40 MG PO TBEC
40.0000 mg | DELAYED_RELEASE_TABLET | Freq: Every day | ORAL | Status: DC
  Administered 2022-07-26 – 2022-07-28 (×3): 40 mg via ORAL
  Filled 2022-07-25 (×3): qty 1

## 2022-07-25 MED ORDER — HYDROMORPHONE HCL 2 MG PO TABS
2.0000 mg | ORAL_TABLET | ORAL | Status: AC
  Administered 2022-07-25: 2 mg

## 2022-07-25 MED ORDER — HYDROMORPHONE HCL 1 MG/ML IJ SOLN: 1.0000 mg | INTRAMUSCULAR | Status: DC

## 2022-07-25 MED ORDER — OSMOLITE 1.2 CAL PO LIQD
1000.0000 mL | ORAL | Status: DC
  Administered 2022-07-25 – 2022-07-28 (×3): 1000 mL

## 2022-07-25 MED ORDER — DIVALPROEX SODIUM 125 MG PO CSDR
125.0000 mg | DELAYED_RELEASE_CAPSULE | Freq: Two times a day (BID) | ORAL | Status: DC
  Administered 2022-07-25 – 2022-07-28 (×6): 125 mg via ORAL
  Filled 2022-07-25 (×6): qty 1

## 2022-07-25 MED ORDER — HYDROMORPHONE HCL 1 MG/ML IJ SOLN
1.0000 mg | INTRAMUSCULAR | Status: DC | PRN
  Administered 2022-07-25 (×4): 1 mg via INTRAVENOUS
  Administered 2022-07-26 (×2): 2 mg via INTRAVENOUS
  Administered 2022-07-26: 1 mg via INTRAVENOUS
  Administered 2022-07-26: 2 mg via INTRAVENOUS
  Filled 2022-07-25: qty 1
  Filled 2022-07-25: qty 2
  Filled 2022-07-25 (×3): qty 1
  Filled 2022-07-25 (×2): qty 2
  Filled 2022-07-25: qty 1

## 2022-07-25 MED ORDER — HYDROCODONE-ACETAMINOPHEN 10-325 MG PO TABS
1.0000 | ORAL_TABLET | Freq: Four times a day (QID) | ORAL | Status: DC | PRN
  Administered 2022-07-25 – 2022-07-27 (×3): 2 via ORAL
  Filled 2022-07-25 (×3): qty 2

## 2022-07-25 MED ORDER — SODIUM CHLORIDE 0.9% FLUSH: 10.0000 mL | INTRAVENOUS | Status: DC | PRN

## 2022-07-25 MED ORDER — POTASSIUM CHLORIDE 10 MEQ/100ML IV SOLN
10.0000 meq | INTRAVENOUS | Status: AC
  Administered 2022-07-25 (×3): 10 meq via INTRAVENOUS
  Filled 2022-07-25 (×4): qty 100

## 2022-07-25 MED ORDER — UMECLIDINIUM BROMIDE 62.5 MCG/ACT IN AEPB
1.0000 | INHALATION_SPRAY | Freq: Every day | RESPIRATORY_TRACT | Status: DC
  Administered 2022-07-25 – 2022-07-27 (×3): 1 via RESPIRATORY_TRACT
  Filled 2022-07-25: qty 7

## 2022-07-25 MED ORDER — HYDROMORPHONE HCL 2 MG PO TABS
1.0000 mg | ORAL_TABLET | ORAL | Status: DC
  Filled 2022-07-25: qty 1

## 2022-07-25 MED ORDER — SODIUM CHLORIDE 0.9% FLUSH
10.0000 mL | Freq: Two times a day (BID) | INTRAVENOUS | Status: DC
  Administered 2022-07-25 – 2022-07-30 (×5): 10 mL

## 2022-07-25 MED ORDER — MAGNESIUM SULFATE 2 GM/50ML IV SOLN
2.0000 g | Freq: Once | INTRAVENOUS | Status: AC
  Administered 2022-07-25: 2 g via INTRAVENOUS
  Filled 2022-07-25: qty 50

## 2022-07-25 NOTE — Progress Notes (Signed)
Secure chat sent to Dr. Sloan Leiter regarding [place] and restrictions of potassium with midline. At this time request midline placement. Fran Lowes, RN VAST

## 2022-07-25 NOTE — Progress Notes (Signed)

## 2022-07-25 NOTE — Progress Notes (Signed)
Patient remains encephalopathic.  No new seizures.  MRI scan brain with multiple areas of metastatic disease (greater than 15 lesions.  Patient with widely metastatic malignant melanoma.  Continue supportive efforts.  Patient needs family conference with regard to goals of further therapy and care.

## 2022-07-25 NOTE — Evaluation (Signed)
Occupational Therapy Evaluation Patient Details Name: Theresa Pace MRN: [id number] DOB: Sep 23, 1960 Today's Date: 07/25/2022   History of Present Illness Theresa Pace is a 62 year old female with a two week history of worsening back pain, with weakness of bilateral lower extremities.  She initially was ambulating with a cane.  She progressed to a walker, and most recently has been nonambulatory. She presented to the [facility] emergency department where thoracic and lumbar MRIs were performed.  These demonstrated multiple osseous metastases involving the thoracic and lumbar spine. She has osseous metastases involving the T8 and T9 vertebral bodies with associated pathologic compression fractures. She has associated extraosseous and epidural extension of tumor into the ventral epidural space at the level of T9 with resultant severe spinal stenosis and cord compression. She was transferred to [facility] in anticipation of thoracic decompressive surgery. 2/28 T8-T9 decompressive laminectomy with resection of epidural tumor, microdissection. Post surgery pt with AMS, EEG negative as well as MRI of head for acute event. PHMx:with a past medical history for metastatic melanoma, tachycardia, seizures, COPD, left bundle branch block, tachycardia, fibromyalgia, GERD, hypercholesterolemia, and IBS.   Clinical Impression   This 61 yo female admitted and underwent above presents to acute OT with PLOF of needing increased A for mobility and ADLs over the last 2 weeks pta. Currently she is total A for all basic ADLs bed level and total A +2 for all mobility. She will continue to benefit from acute OT with follow up at SNF (dependent on what is decided treatment wise for her cancer). If pt goes home from here then she will need DME as listed below. We will continue to follow.      Recommendations for follow up therapy are one component of a multi-disciplinary discharge planning process, led by the attending physician.   Recommendations may be updated based on patient status, additional functional criteria and insurance authorization.   Follow Up Recommendations  Skilled nursing-short term rehab (<3 hours/day)     Assistance Recommended at Discharge Frequent or constant Supervision/Assistance  Patient can return home with the following Two people to help with walking and/or transfers;Two people to help with bathing/dressing/bathroom;Assistance with cooking/housework;Assistance with feeding;Help with stairs or ramp for entrance;Assist for transportation;Direct supervision/assist for financial management;Direct supervision/assist for medications management    Functional Status Assessment  Patient has had a recent decline in their functional status and demonstrates the ability to make significant improvements in function in a reasonable and predictable amount of time.  Equipment Recommendations  Hospital bed;Other (comment) (hoyer lift--if goes home)       Precautions / Restrictions Precautions Precautions: Back;Fall Precaution Booklet Issued: No Restrictions Weight Bearing Restrictions: No      Mobility Bed Mobility Overal bed mobility: Needs Assistance Bed Mobility: Rolling, Sidelying to Sit, Sit to Sidelying Rolling: Total assist, +2 for physical assistance Sidelying to sit: Total assist, +2 for physical assistance     Sit to sidelying: +2 for physical assistance, Total assist      Transfers Overall transfer level: Needs assistance Equipment used: 2 person hand held assist Transfers: Sit to/from Stand Sit to Stand: Total assist, +2 physical assistance           General transfer comment: total A +2 to attempt to stand with <5% WB through LEs, lateral scoot up towards HOB x3 with use of bed pad (total A +2) and partial sit>stand      Balance Overall balance assessment: Needs assistance Sitting-balance support: No upper extremity supported,  Feet supported Sitting balance-Leahy Scale:  Zero Sitting balance - Comments: varied from poor to fair                                   ADL either performed or assessed with clinical judgement   ADL                                         General ADL Comments: total A     Vision Baseline Vision/History: 0 No visual deficits Ability to See in Adequate Light: 0 Adequate Additional Comments: pt with eyes and head somwhat rotated to left, not able to get eyes to midline with cuing            Pertinent Vitals/Pain Pain Assessment Pain Assessment: PAINAD Breathing: occasional labored breathing, short period of hyperventilation Negative Vocalization: repeated troubled calling out, loud moaning/groaning, crying Facial Expression: facial grimacing Body Language: tense, distressed pacing, fidgeting Consolability: distracted or reassured by voice/touch PAINAD Score: 7 Pain Location: unsure Pain Descriptors / Indicators: Grimacing, Guarding Pain Intervention(s): Limited activity within patient's tolerance, Monitored during session, Repositioned, Patient requesting pain meds-RN notified     Hand Dominance Right   Extremity/Trunk Assessment Upper Extremity Assessment Upper Extremity Assessment: Generalized weakness (moving both spontaneously, but not on commands)           Communication Communication Communication: No difficulties (pta)   Cognition Arousal/Alertness: Awake/alert Behavior During Therapy: Restless Overall Cognitive Status: Impaired/Different from baseline Area of Impairment: Attention, Following commands, Safety/judgement, Awareness, Problem solving                   Current Attention Level: Focused   Following Commands:  (not following any commands) Safety/Judgement: Decreased awareness of safety, Decreased awareness of deficits Awareness: Intellectual Problem Solving: Slow processing, Decreased initiation, Difficulty sequencing, Requires verbal cues, Requires  tactile cues General Comments: Pt did respond with "what" x2 when husband kept calling her name (he had him standing on her right side calling her name to see if she would look at him). Other than this just moans/groans                Home Living Family/patient expects to be discharged to:: Private residence Living Arrangements: Spouse/significant other Available Help at Discharge: Family;Available 24 hours/day Type of Home: Mobile home Home Access: Stairs to enter Entrance Stairs-Number of Steps: 2 Entrance Stairs-Rails: None Home Layout: One level     Bathroom Shower/Tub: Tub/shower unit         Home Equipment: Conservation officer, nature (2 wheels);Shower seat;BSC/3in1   Additional Comments: have ordered a wheelchair      Prior Functioning/Environment Prior Level of Function : Needs assist       Physical Assist : Mobility (physical);ADLs (physical) Mobility (physical): Bed mobility;Transfers ADLs (physical): Bathing;Dressing;Toileting Mobility Comments: Was ambulatory up until 2 weeks ago when she could not walk on her own ADLs Comments: increasing need for ADL A as ambulation worsened        OT Problem List: Decreased strength;Decreased range of motion;Decreased activity tolerance;Impaired balance (sitting and/or standing);Impaired vision/perception;Decreased coordination;Decreased cognition;Decreased safety awareness;Decreased knowledge of use of DME or AE;Decreased knowledge of precautions;Impaired UE functional use;Pain      OT Treatment/Interventions: Self-care/ADL training;Balance training;Patient/family education;DME and/or AE instruction;Cognitive remediation/compensation    OT Goals(Current goals can be found in the  care plan section) Acute Rehab OT Goals Patient Stated Goal: husband hopes to be able to take her home OT Goal Formulation: With family Time For Goal Achievement: 08/08/22 Potential to Achieve Goals: Fair  OT Frequency: Min 2X/week    Co-evaluation  PT/OT/SLP Co-Evaluation/Treatment: Yes Reason for Co-Treatment: Necessary to address cognition/behavior during functional activity;For patient/therapist safety;To address functional/ADL transfers PT goals addressed during session: Mobility/safety with mobility;Balance;Strengthening/ROM OT goals addressed during session: Strengthening/ROM;ADL's and self-care      AM-PAC OT "6 Clicks" Daily Activity     Outcome Measure Help from another person eating meals?: Total Help from another person taking care of personal grooming?: Total Help from another person toileting, which includes using toliet, bedpan, or urinal?: Total Help from another person bathing (including washing, rinsing, drying)?: Total Help from another person to put on and taking off regular upper body clothing?: Total Help from another person to put on and taking off regular lower body clothing?: Total 6 Click Score: 6   End of Session Equipment Utilized During Treatment: Gait belt Nurse Communication:  (pt needs pain meds based off of therapy interaction with her (pt moaning and groaning while seated EOB))  Activity Tolerance: Patient limited by fatigue;Patient limited by lethargy Patient left: in bed;with call bell/phone within reach;with bed alarm set;with family/visitor present  OT Visit Diagnosis: Unsteadiness on feet (R26.81);Other abnormalities of gait and mobility (R26.89);Muscle weakness (generalized) (M62.81);Low vision, both eyes (H54.2);Other symptoms and signs involving cognitive function;Pain Pain - part of body:  (we think back)                Time: 1000-1027 OT Time Calculation (min): 27 min Charges:  OT General Charges $OT Visit: 1 Visit OT Evaluation $OT Eval Moderate Complexity: 1 Theresa Pace [phone]    Almon Register 07/25/2022, 11:48 AM

## 2022-07-25 NOTE — Consult Note (Signed)
Consultation Note Date: 07/25/2022   Patient Name: Theresa Pace  DOB: September 12, 1960  MRN: [id number]  Age / Sex: 62 y.o., female  PCP: [facility] Referring Physician: Jonetta Osgood, MD  Reason for Consultation: Establishing goals of care  HPI/Patient Profile: 62 y.o. female  with past medical history of metastatic melanoma, COPD, left bundle branch block, tachycardia, chronic pain syndrome, ADHD, GERD, IBS admitted on 07/09/2022 with metastatic cord compression s/p surgical resection of tumor at T8-T9 on 07/20/22. Hospitalization complicated by new finding of brain metastasis with symptoms of confusion and seizures.   Clinical Assessment and Goals of Care: Consult received and extensive chart review completed. Noted oncology visits prior to admission. I discussed with RN prior to visiting with Theresa Pace and husband, Theresa Pace. Theresa Pace was present but not participating in conversation. Conversation was difficult due to discomfort as we discovered that her IV infiltrated as IV potassium infusing and she was in pain and we attempted to calm her.   I did speak with Theresa Pace about Theresa Pace's progressing cancer and concern for brain lesions. I expressed concern for her current condition and risks vs benefits of interventions that they decide to pursue. We discussed the side effects of radiation to the brain but also concern that she will improve to any level of quality of life or be able to be strong enough to pursue further treatment for her cancer that appears to be progressing rapidly. We discussed her suffering and her poor prognosis. I tried to prepare Theresa Pace that I worry that he will be in a position to make many difficult decisions moving forward. Theresa Pace shares that he had to make all these decisions for his parents so he does understand how this works. I pointed Theresa Pace in the direction of the cafeteria as he was not  feeling well because he had not eaten today - I further encouraged self care.   All questions/concerns addressed. Emotional support provided.   Primary Decision Maker NEXT OF KIN husband Theresa Pace    SUMMARY OF RECOMMENDATIONS   - Ongoing goals of care conversations needed - I will follow up tomorrow  Code Status/Advance Care Planning: Full code - plan to discuss further tomorrow   Symptom Management:  Per attending, neurosurgery  Psycho-social/Spiritual:  Desire for further Chaplaincy support:yes  Prognosis:  Overall prognosis very poor.   Discharge Planning: To Be Determined      Primary Diagnoses: Present on Admission:  Thoracic spinal stenosis  Metastasis of neoplasm to spinal canal [facility])   I have reviewed the medical record, interviewed the patient and family, and examined the patient. The following aspects are pertinent.  Past Medical History:  Diagnosis Date   Arthritis    Asthma    Cancer ([place])    COPD (chronic obstructive pulmonary disease) ([place])    Depression    patient states she is no longer depressed   Fibromyalgia    Frequent urination at night    GERD (gastroesophageal reflux disease)    Hypercholesteremia    IBS (irritable bowel  syndrome)    Tachycardia    treated with Cardizem   Tendonitis    Social History   Socioeconomic History   Marital status: Married    Spouse name: Not on file   Number of children: Not on file   Years of education: Not on file   Highest education level: Not on file  Occupational History   Not on file  Tobacco Use   Smoking status: Former    Packs/day: 0.50    Types: Cigarettes    Quit date: 08/21/2021    Years since quitting: 0.9    Passive exposure: Past   Smokeless tobacco: Never  Vaping Use   Vaping Use: Never used  Substance and Sexual Activity   Alcohol use: No   Drug use: No   Sexual activity: Yes    Birth control/protection: Surgical  Other Topics Concern   Not on file  Social History Narrative    Not on file   Social Determinants of Health   Financial Resource Strain: Not on file  Food Insecurity: No Food Insecurity (07/20/2022)   Hunger Vital Sign    Worried About Running Out of Food in the Last Year: Never true    Ran Out of Food in the Last Year: Never true  Transportation Needs: No Transportation Needs (07/20/2022)   PRAPARE - Transportation    Lack of Transportation (Medical): No    Lack of Transportation (Non-Medical): No  Physical Activity: Not on file  Stress: Not on file  Social Connections: Not on file   Family History  Problem Relation Age of Onset   Lung cancer Mother        deceased at 93   Cancer Mother        GYN cancer and died in her 58s   Diabetes Brother    Colon cancer Neg Hx    Scheduled Meds:  arformoterol  15 mcg Nebulization BID   baclofen  10 mg Oral TID   budesonide (PULMICORT) nebulizer solution  0.25 mg Nebulization BID   dexamethasone (DECADRON) injection  2 mg Intravenous Q8H   docusate sodium  100 mg Oral BID   melatonin  5 mg Oral QHS   metoprolol tartrate  5 mg Intravenous Q6H   [START ON 07/26/2022] pantoprazole  40 mg Oral Daily   polyethylene glycol  17 g Oral Daily   revefenacin  175 mcg Nebulization Daily   rosuvastatin  20 mg Oral QPM   senna-docusate  2 tablet Oral QHS   umeclidinium bromide  1 puff Inhalation Daily   Continuous Infusions:  sodium chloride 100 mL/hr at 07/24/22 1750   levETIRAcetam 500 mg (07/25/22 0919)   methocarbamol (ROBAXIN) IV Stopped (07/24/22 1630)   potassium chloride 10 mEq (07/25/22 1201)   PRN Meds:.acetaminophen **OR** acetaminophen, albuterol, bisacodyl, HYDROcodone-acetaminophen, HYDROmorphone (DILAUDID) injection, LORazepam, menthol-cetylpyridinium **OR** phenol, methocarbamol (ROBAXIN) IV, metoprolol tartrate, naLOXone (NARCAN)  injection, ondansetron (ZOFRAN) IV **OR** ondansetron, sodium phosphate Allergies  Allergen Reactions   Methadone Hives   Aspirin Other (See Comments)     Stomach Pain   Review of Systems  Unable to perform ROS: Acuity of condition    Physical Exam Constitutional:      Appearance: She is ill-appearing.  Cardiovascular:     Rate and Rhythm: Normal rate.  Pulmonary:     Effort: No tachypnea, accessory muscle usage or respiratory distress.  Abdominal:     Palpations: Abdomen is soft.  Skin:    Comments: Acute erythema and edema from antecubital  IV site into upper arm - infiltrated IV during potassium infusion (heat pad applied).   Neurological:     Mental Status: She is alert.     Comments: Responds with "yes" occasionally but not consistently. Not following commands.      Vital Signs: BP (!) 103/59 (BP Location: Left Arm)   Pulse 99   Temp 97.6 F (36.4 C) (Axillary)   Resp 15   Ht '5\' 2"'$  (1.575 m)   Wt 68 kg   SpO2 99%   BMI 27.42 kg/m  Pain Scale: PAINAD   Pain Score: 9    SpO2: SpO2: 99 % O2 Device:SpO2: 99 % O2 Flow Rate: .   IO: Intake/output summary:  Intake/Output Summary (Last 24 hours) at 07/25/2022 1245 Last data filed at 07/25/2022 1203 Gross per 24 hour  Intake 1339.1 ml  Output 1800 ml  Net -460.9 ml    LBM: Last BM Date : 07/24/22 Baseline Weight: Weight: 69.9 kg Most recent weight: Weight: 68 kg     Palliative Assessment/Data:     Time In: 1245  Time Total: 85 min  Greater than 50%  of this time was spent counseling and coordinating care related to the above assessment and plan.  Signed by: Vinie Sill, NP Palliative Medicine Team Pager # [phone] (M-F 8a-5p) Team Phone # [phone] (Nights/Weekends)

## 2022-07-25 NOTE — Evaluation (Signed)
Physical Therapy Evaluation Patient Details Name: Theresa Pace MRN: [id number] DOB: 09/17/1960 Today's Date: 07/25/2022  History of Present Illness  Mrs. Baldonado is a 62 year old female with a two week history of worsening back pain, with weakness of bilateral lower extremities.   Found to have multiple osseous metastases involving the thoracic (including pathologic comp fx of T8&T9), and lumbar spine. She has associated extraosseous and epidural extension of tumor into the ventral epidural space at the level of T9 with resultant severe spinal stenosis and cord compression. She was transferred to [facility] and underwent T8-T9 decompressive laminectomy with resection of epidural tumor, microdissection on 2/287/24.  Post surgery pt with AMS, EEG negative as well as MRI of head for acute event. PHMx:with a past medical history for metastatic melanoma, tachycardia, seizures, COPD, left bundle branch block, tachycardia, fibromyalgia, GERD, hypercholesterolemia, and IBS.  Clinical Impression  Patient presents with decreased cognition, decreased balance, decreased strength, decreased activity tolerance with HR up to 124 with EOB activity.  She was previously ambulatory until about 2-3 weeks PTA.  Spouse reports helping her to the [facility] at home and had ordered a wheelchair.  Currently too restless for safety to return home and spouse reports plans to set up radiation therapy.  Feel she will benefit from skilled PT in the acute setting and will need SNF level rehab prior to d/c home.        Recommendations for follow up therapy are one component of a multi-disciplinary discharge planning process, led by the attending physician.  Recommendations may be updated based on patient status, additional functional criteria and insurance authorization.  Follow Up Recommendations Skilled nursing-short term rehab (<3 hours/day) Can patient physically be transported by private vehicle: No    Assistance Recommended at  Discharge Frequent or constant Supervision/Assistance  Patient can return home with the following  Two people to help with walking and/or transfers;Two people to help with bathing/dressing/bathroom;Help with stairs or ramp for entrance;Assist for transportation;Direct supervision/assist for medications management;Assistance with cooking/housework;Assistance with feeding    Equipment Recommendations Hospital bed;Wheelchair (measurements PT);Wheelchair cushion (measurements PT)  Recommendations for Other Services       Functional Status Assessment Patient has had a recent decline in their functional status and demonstrates the ability to make significant improvements in function in a reasonable and predictable amount of time.     Precautions / Restrictions Precautions Precautions: Fall Precaution Booklet Issued: No Precaution Comments: hand mitts and wrist restraints, new coretrack Restrictions Weight Bearing Restrictions: No      Mobility  Bed Mobility Overal bed mobility: Needs Assistance Bed Mobility: Rolling, Sidelying to Sit, Sit to Sidelying Rolling: Total assist, +2 for physical assistance Sidelying to sit: Total assist, +2 for physical assistance     Sit to sidelying: +2 for physical assistance, Total assist      Transfers Overall transfer level: Needs assistance Equipment used: 2 person hand held assist Transfers: Sit to/from Stand Sit to Stand: Total assist, +2 physical assistance           General transfer comment: total A +2 to attempt to stand with <5% WB through LEs, lateral scoot up towards HOB x3 with use of bed pad (total A +2) and partial sit>stand    Ambulation/Gait                  Stairs            Wheelchair Mobility    Modified Rankin (Stroke Patients Only)  Balance Overall balance assessment: Needs assistance Sitting-balance support: No upper extremity supported, Feet supported Sitting balance-Leahy Scale:  Zero Sitting balance - Comments: varied from mod to max A     Standing balance-Leahy Scale: Zero                               Pertinent Vitals/Pain Pain Assessment Pain Assessment: PAINAD Breathing: occasional labored breathing, short period of hyperventilation Negative Vocalization: repeated troubled calling out, loud moaning/groaning, crying Facial Expression: facial grimacing Body Language: tense, distressed pacing, fidgeting Consolability: distracted or reassured by voice/touch PAINAD Score: 7 Pain Location: generalized Pain Descriptors / Indicators: Grimacing, Guarding Pain Intervention(s): Monitored during session, Repositioned, Patient requesting pain meds-RN notified    Home Living Family/patient expects to be discharged to:: Private residence Living Arrangements: Spouse/significant other Available Help at Discharge: Family;Available 24 hours/day Type of Home: Mobile home Home Access: Stairs to enter Entrance Stairs-Rails: None Entrance Stairs-Number of Steps: 2   Home Layout: One level Home Equipment: Conservation officer, nature (2 wheels);Shower seat;BSC/3in1 Additional Comments: have ordered a wheelchair    Prior Function Prior Level of Function : Needs assist       Physical Assist : Mobility (physical);ADLs (physical) Mobility (physical): Bed mobility;Transfers ADLs (physical): Bathing;Dressing;Toileting Mobility Comments: Was ambulatory up until 2 weeks ago when she could not walk on her own ADLs Comments: increasing need for ADL A as ambulation worsened     Hand Dominance   Dominant Hand: Right    Extremity/Trunk Assessment   Upper Extremity Assessment Upper Extremity Assessment: Defer to OT evaluation    Lower Extremity Assessment Lower Extremity Assessment: RLE deficits/detail;LLE deficits/detail RLE Deficits / Details: did not note movement or weight support with standing attempts LLE Deficits / Details: noted some effort to lift leg into bed  with sit to supine and minimal weight support with standing attempts    Cervical / Trunk Assessment Cervical / Trunk Assessment: Kyphotic;Back Surgery  Communication   Communication: No difficulties (PTA)  Cognition Arousal/Alertness: Awake/alert Behavior During Therapy: Restless Overall Cognitive Status: Impaired/Different from baseline Area of Impairment: Attention, Following commands, Safety/judgement, Awareness, Problem solving                   Current Attention Level: Focused   Following Commands:  (not following commands) Safety/Judgement: Decreased awareness of safety, Decreased awareness of deficits Awareness: Intellectual Problem Solving: Slow processing, Decreased initiation, Difficulty sequencing, Requires verbal cues, Requires tactile cues General Comments: Pt did respond with "what" x2 when husband kept calling her name (he had him standing on her right side calling her name to see if she would look at him). Other than this just moans/groans        General Comments General comments (skin integrity, edema, etc.): spouse present and supportive, seems hopeful she can return home; HR 124 after session; RN aware pt need pain meds    Exercises     Assessment/Plan    PT Assessment Patient needs continued PT services  PT Problem List Decreased strength;Decreased balance;Decreased cognition;Pain;Decreased mobility;Decreased safety awareness;Decreased activity tolerance       PT Treatment Interventions DME instruction;Functional mobility training;Balance training;Patient/family education;Therapeutic activities;Neuromuscular re-education;Therapeutic exercise;Cognitive remediation    PT Goals (Current goals can be found in the Care Plan section)  Acute Rehab PT Goals Patient Stated Goal: to return home eventually PT Goal Formulation: With family Time For Goal Achievement: 08/08/22 Potential to Achieve Goals: Fair    Frequency Min 3X/week  Co-evaluation  PT/OT/SLP Co-Evaluation/Treatment: Yes Reason for Co-Treatment: Necessary to address cognition/behavior during functional activity;For patient/therapist safety;To address functional/ADL transfers PT goals addressed during session: Mobility/safety with mobility;Balance;Strengthening/ROM OT goals addressed during session: Strengthening/ROM;ADL's and self-care       AM-PAC PT "6 Clicks" Mobility  Outcome Measure Help needed turning from your back to your side while in a flat bed without using bedrails?: Total Help needed moving from lying on your back to sitting on the side of a flat bed without using bedrails?: Total Help needed moving to and from a bed to a chair (including a wheelchair)?: Total Help needed standing up from a chair using your arms (e.g., wheelchair or bedside chair)?: Total Help needed to walk in hospital room?: Total Help needed climbing 3-5 steps with a railing? : Total 6 Click Score: 6    End of Session Equipment Utilized During Treatment: Gait belt Activity Tolerance: Patient limited by pain Patient left: in bed;with bed alarm set;with family/visitor present;with restraints reapplied   PT Visit Diagnosis: Other symptoms and signs involving the nervous system (R29.898);Other abnormalities of gait and mobility (R26.89);Muscle weakness (generalized) (M62.81)    Time: 1000-1027 PT Time Calculation (min) (ACUTE ONLY): 27 min   Charges:   PT Evaluation $PT Eval Moderate Complexity: 1 Mod          Cyndi Jhoel Stieg, PT Acute Rehabilitation Services [phone] 07/25/2022   Reginia Naas 07/25/2022, 1:42 PM

## 2022-07-25 NOTE — Procedures (Signed)
Cortrak  Person Inserting Tube:  Ranell Patrick D, RD Tube Type:  Cortrak - 43 inches Tube Size:  10 Tube Location:  Left nare Secured by: Bridle Technique Used to Measure Tube Placement:  Marking at nare/corner of mouth Cortrak Secured At:  65 cm Procedure Comments:  Cortrak Tube Team Note:  Consult received to place a Cortrak feeding tube.   X-ray is required, abdominal x-ray has been ordered by the Cortrak team. Please confirm tube placement before using the Cortrak tube.   If the tube becomes dislodged please keep the tube and contact the Cortrak team at [email or web address] for replacement.  If after hours and replacement cannot be delayed, place a NG tube and confirm placement with an abdominal x-ray.    Ranell Patrick, RD, LDN Clinical Dietitian RD pager # available in [place]  After hours/weekend pager # available in [facility]

## 2022-07-25 NOTE — Progress Notes (Addendum)
Initial Nutrition Assessment  DOCUMENTATION CODES:   Non-severe (moderate) malnutrition in context of chronic illness  INTERVENTION:   Tube Feeding via Cortrak (gastric): Osmolite 1.2 at 65 ml/hr Begin TF at rate of 20 ml/hr; titrate by 10 mL q 12 hours until goal rate of 65 ml/hr TF at goal rate provides 87 g of protein, 1872 kcals, 1264 mL of free water.   Monitor magnesium, potassium, and phosphorus BID for at least 3 days, MD to replete as needed, as pt is at risk for refeeding syndrome given prolonged poor oral intake, malnutrition present on admission.  No phosphorus checked yet this admission; recommend checking phosphorus now (pull from AM labs) and supplementing if low.    NUTRITION DIAGNOSIS:   Moderate Malnutrition related to chronic illness (metastatic melanoma) as evidenced by mild fat depletion, moderate fat depletion, moderate muscle depletion, severe muscle depletion.  GOAL:   Patient will meet greater than or equal to 90% of their needs  MONITOR:   PO intake, TF tolerance, Labs, Weight trends, Skin  REASON FOR ASSESSMENT:   Consult Enteral/tube feeding initiation and management  ASSESSMENT:   62 yo female admitted with AMS, worsening back pain and LE weakness with severe spinal stenosis and tumor extension in setting of known metastatic cancer. PMH includes metastatic melanoma currently undergoing immunotherapy, COPD, chronic pain syndrome, ADHD, HTN, HLD.  2/27 CT abdomen/pelvis: diffuse progressive metastatic disease 2/28 T8-T9 decompressive laminectomy with resection of tumor  Cortrak placed this AM-gastric. Consult received for TF initiation and management.   Pt awake on visit today, confused, not following commands. Pt noted to be tugging at Cortrak tube, RN notified  No family at bedside; unable to obtain diet and weight history at this time. Noted palliative care has been consulted  Pt currently on Dysphagia 1 diet but did not touch lunch tray,  mental status may be prohibiting safe po intake at this time. Limited documentation of meals, one meal of 0% documented on 2/28. Per report, pt has not been eating well since admission  Per weight encounters, pt does appear to have experienced weight loss. Current wt 68 kg but pt with moderate edema on exam; actual dry weight unknown at this time.     Wt Readings from Last 10 Encounters:  07/20/22 68 kg  07/01/22 69.9 kg  05/25/22 69.9 kg  04/11/22 76.3 kg  02/03/22 77.6 kg  12/28/21 75.5 kg  12/02/21 78 kg  09/23/21 78 kg  05/31/21 71.7 kg  05/11/21 78.4 kg    Severity of LE wasting likely related in part to patient's immobility. Pt had been ambulating with a cane, then progressed to a walker and most recently has been non-ambulatory  Labs: potassium 3.2 (L) Meds: decadron, miralax, KCl  NUTRITION - FOCUSED PHYSICAL EXAM:  Flowsheet Row Most Recent Value  Orbital Region Mild depletion  Upper Arm Region Unable to assess  [edema]  Thoracic and Lumbar Region Moderate depletion  Buccal Region Mild depletion  Temple Region Moderate depletion  Clavicle Bone Region Moderate depletion  Clavicle and Acromion Bone Region Moderate depletion  Scapular Bone Region Moderate depletion  Dorsal Hand Unable to assess  [edema]  Patellar Region Severe depletion  Anterior Thigh Region Severe depletion  Posterior Calf Region Severe depletion  Edema (RD Assessment) Moderate  Hair Reviewed  Eyes Unable to assess  Mouth Unable to assess  Skin --  [ecchymosis]  Nails Reviewed       Diet Order:   Diet Order  DIET - DYS 1 Room service appropriate? No; Fluid consistency: Thin  Diet effective now                   EDUCATION NEEDS:   Not appropriate for education at this time  Skin:  Skin Assessment: Reviewed RN Assessment  Last BM:  3/3  Height:   Ht Readings from Last 1 Encounters:  07/20/22 '5\' 2"'$  ([id number] m)    Weight:   Wt Readings from Last 1 Encounters:   07/20/22 68 kg    BMI:  Body mass index is 27.42 kg/m.  Estimated Nutritional Needs:   Kcal:  1700-1900 kcals  Protein:  75-100 g  Fluid:  >/= 1.7 L   Kerman Passey MS, RDN, LDN, CNSC Registered Dietitian 3 Clinical Nutrition RD Pager and On-Call Pager Number Located in [place]

## 2022-07-25 NOTE — Progress Notes (Signed)
  Echocardiogram 2D Echocardiogram has been performed.  Theresa Pace 07/25/2022, 9:32 AM

## 2022-07-25 NOTE — Progress Notes (Incomplete)
     Referral received for Theresa Pace :goals of care discussion. Chart reviewed and updates received from RN. Patient assessed and is unable to engage appropriately in discussions. Attempted to contact patient's *******. Unable to reach. Voicemail left with contact information given.   I was able to speak with *****. GOC meeting scheduled for *** @ ****. Family is aware we will meet at patient's bedside.   PMT will re-attempt to contact family at a later time/date. Detailed note and recommendations to follow once GOC has been completed.   Thank you for your referral and allowing PMT to assist in Mr./Mrs. Appleton care.   Alda Lea, AGPCNP-BC Palliative Medicine Team  Phone: [phone] Pager: [phone] Amion: N. [place]   NO CHARGE

## 2022-07-25 NOTE — Progress Notes (Signed)
PROGRESS NOTE        PATIENT DETAILS Name: Theresa Pace Age: 62 y.o. Sex: female Date of Birth: 04-20-1961 Admit Date: 07/11/2022 Admitting Physician Earnie Larsson, MD [id number], [facility]  Brief Summary: 62 year old with history of metastatic melanoma, COPD, chronic pain syndrome, ADHD-who is currently undergoing immunotherapy for metastatic melanoma-presented to the hospital with worsening back pain and lower extremity weakness-she evaluated with an MRI which showed pathological T8/T9 fractures and epidural extension of the tumor-she was taken to the OR on 2/28 for decompression by neurosurgery. TRH was consulted on 2/29 for evaluation of confusion-on 3/1-patient was found to have breakthrough seizures.  See below for further details.   Significant events: 2/27>> admit by neurosurgery 2/28>> T8-T9 decompressive laminectomy with resection of tumor 2/29>> consult to [facility] assumed primary service. 3/01>> seizure-like activity-neurology consulted.  Significant studies: 2/27>> CT abdomen/pelvis: Diffuse progressive metastatic disease throughout lower chest/abdomen/pelvis. 2/27>> MRI T-spine: T8-T9 vertebral bodies compression fracture, extraosseous/epidural extension into the ventral epidural space at T9 with resultant severe spinal stenosis/cord compression. 2/27>> MRI L-spine: Multiple osseous mets involving lumbar spine, no extraosseous or epidural extension of tumor. 2/29>> CXR: Right mid lung alveolar process unchanged. 3/1-3/2>>LTM  EEG: No seizures. 3/02>> MRI brain: Numerous enhancing lesions in the supratentorial and infratentorial brain-consistent with metastatic disease.  Significant microbiology data: 2/27>> COVID/influenza/RSV PCR: Negative 2/29>> blood culture: No growth  Procedures: 2/28>> T8-T9 decompressive laminectomy with resection of tumor  Consults: Neurology Neurosurgery  Subjective: Remains confused-will speak a few  words.  Objective: Vitals: Blood pressure 136/84, pulse (!) 108, temperature 99.6 F (37.6 C), temperature source Axillary, resp. rate (!) 22, height '5\' 2"'$  (1.575 m), weight 68 kg, SpO2 98 %.   Exam: Gen Exam:not in any distress HEENT:atraumatic, normocephalic Chest: B/L clear to auscultation anteriorly CVS:S1S2 regular Abdomen:soft non tender, non distended Extremities:no edema Neurology: Difficult exam but appears nonfocal. Skin: no rash  Pertinent Labs/Radiology:    Latest Ref Rng & Units 07/24/2022    7:51 AM 07/23/2022    8:19 AM 07/22/2022    6:52 AM  CBC  WBC 4.0 - 10.5 K/uL 12.9  11.9  10.7   Hemoglobin 12.0 - 15.0 g/dL 9.5  8.8  9.3   Hematocrit 36.0 - 46.0 % 29.4  28.7  28.9   Platelets 150 - 400 K/uL 383  457  465     Lab Results  Component Value Date   NA 135 07/25/2022   K 3.2 (L) 07/25/2022   CL 104 07/25/2022   CO2 21 (L) 07/25/2022     Assessment/Plan: Metastatic melanoma with T8/T9 pathologic fracture  Cord compression at T8-T9 level Epidural tumor with myelopathy S/p T8-T9 decompressive laminectomy with resection of tumor on 2/28 Widespread metastatic melanoma with brain mets Postoperative care deferred to neurosurgery Continue IV Decadron LTM EEG negative for seizures-remains on IV Keppra Radiation oncology consult placed 3/4 Awaiting palliative care consultation as well  Acute toxic metabolic encephalopathy Breakthrough seizure Initially thought to have acute encephalopathy in the setting of postop status/narcotics/UTI-however had seizure-like activity on 3/1-and subsequently started on IV Keppra. Remains encephalopathic-suspect this is multifactorial from brain mets/postop status/pain LTM EEG negative Continue Keppra per neurology Continue to treat pain with narcotics-difficult situation (on narcotics at home as well) Minimize benzodiazepines Will switch Abilify to Seroquel to see if this helps-but will and need to follow QTc closely.  Per  nursing staff-Haldol did not work yesterday. Maintain delirium precautions.  Hypokalemia/hypomagnesemia Replete/recheck  Normocytic anemia Likely due to acute illness/underlying malignancy Follow CBC  COPD Not in exacerbation Continue bronchodilators   Complicated UTI Completed course of IV Rocephin  ?  PAF/MAT Continues to have episodes of narrow complex tachycardia Continue IV beta-blocker Await echo Not safe for anticoagulation given recent thoracic spine surgery.    HLD Statin   Chronic pain syndrome IV Dilaudid for severe pain, Norco for moderate pain On chronic narcotics at home   HTN Relatively stable Now on IV metoprolol.  ADHD/fibromyalgia/depression Will hold Abilify/trazodone given ongoing encephalopathy. Holding Adderall  Failure to thrive syndrome Palliative care Per spouse-had decreased oral intake given prior to this hospitalization Per nursing staff-no significant oral intake over the past several days due to severe encephalopathy Place cortraK tube and begin ng feedings Remains a full code-awaiting palliative care consultation-to have significant metastatic burden including multiple brain mets.  BMI: Estimated body mass index is 27.42 kg/m as calculated from the following:   Height as of this encounter: '5\' 2"'$  (1.575 m).   Weight as of this encounter: 68 kg.   Code status:   Code Status: Full Code   DVT Prophylaxis: SCD's Start: 07/20/22 2118 SCDs Start: 07/01/2022 2316    Family Communication: Spouse-Theresa Pace-[phone]-updated 3/4   Disposition Plan: Status is: Inpatient Remains inpatient appropriate because: Severity of illness   Planned Discharge Destination:Skilled nursing facility   Diet: Diet Order             DIET - DYS 1 Room service appropriate? No; Fluid consistency: Thin  Diet effective now                     Antimicrobial agents: Anti-infectives (From admission, onward)    Start     Dose/Rate Route  Frequency Ordered Stop   07/22/22 0915  cefTRIAXone (ROCEPHIN) 2 g in sodium chloride 0.9 % 100 mL IVPB        2 g 200 mL/hr over 30 Minutes Intravenous Every 24 hours 07/21/22 1016 07/24/22 1034   07/21/22 0915  cefTRIAXone (ROCEPHIN) 1 g in sodium chloride 0.9 % 100 mL IVPB  Status:  Discontinued        1 g 200 mL/hr over 30 Minutes Intravenous Every 24 hours 07/21/22 0821 07/21/22 1016   07/20/22 2215  ceFAZolin (ANCEF) IVPB 1 g/50 mL premix        1 g 100 mL/hr over 30 Minutes Intravenous Every 8 hours 07/20/22 2118 07/21/22 0610   07/20/22 1007  ceFAZolin (ANCEF) IVPB 2g/100 mL premix        2 g 200 mL/hr over 30 Minutes Intravenous 30 min pre-op 07/20/22 1007 07/20/22 1810        MEDICATIONS: Scheduled Meds:  arformoterol  15 mcg Nebulization BID   baclofen  10 mg Oral TID   budesonide (PULMICORT) nebulizer solution  0.25 mg Nebulization BID   dexamethasone (DECADRON) injection  2 mg Intravenous Q8H   docusate sodium  100 mg Oral BID   melatonin  5 mg Oral QHS   metoprolol tartrate  5 mg Intravenous Q6H   pantoprazole (PROTONIX) IV  40 mg Intravenous Daily   polyethylene glycol  17 g Oral Daily   revefenacin  175 mcg Nebulization Daily   rosuvastatin  20 mg Oral QPM   traZODone  150 mg Oral QHS   umeclidinium bromide  1 puff Inhalation Daily   Continuous Infusions:  sodium chloride 100 mL/hr  at 07/24/22 1750   levETIRAcetam 500 mg (07/24/22 2141)   methocarbamol (ROBAXIN) IV Stopped (07/24/22 1630)   potassium chloride     PRN Meds:.acetaminophen **OR** acetaminophen, albuterol, bisacodyl, HYDROcodone-acetaminophen, HYDROmorphone (DILAUDID) injection, LORazepam, menthol-cetylpyridinium **OR** phenol, methocarbamol (ROBAXIN) IV, metoprolol tartrate, naLOXone (NARCAN)  injection, ondansetron (ZOFRAN) IV **OR** ondansetron, sodium phosphate   I have personally reviewed following labs and imaging studies  LABORATORY DATA: CBC: Recent Labs  Lab 06/29/2022 1555  07/20/22 0055 07/21/22 1126 07/22/22 0652 07/23/22 0819 07/24/22 0751  WBC 11.1* 10.1 15.1* 10.7* 11.9* 12.9*  NEUTROABS 8.6*  --  13.3*  --   --   --   HGB 11.1* 10.5* 9.8* 9.3* 8.8* 9.5*  HCT 34.7* 33.4* 30.1* 28.9* 28.7* 29.4*  MCV 94.0 94.4 94.1 93.2 95.0 93.0  PLT 535* 474* 500* 465* 457* 383     Basic Metabolic Panel: Recent Labs  Lab 07/21/22 1126 07/22/22 0652 07/23/22 0819 07/24/22 0751 07/25/22 0541 07/25/22 0757  NA 136 139 143 141  --  135  K 4.2 4.0 3.6 3.8  --  3.2*  CL 99 105 113* 113*  --  104  CO2 24 21* 19* 17*  --  21*  GLUCOSE 273* 153* 123* 104*  --  154*  BUN 29* 34* 27* 16  --  11  CREATININE 0.79 0.85 0.68 0.60  --  0.45  CALCIUM 7.4* 7.3* 7.5* 7.6*  --  7.7*  MG  --  2.3  --   --  1.9  --      GFR: Estimated Creatinine Clearance: 67.6 mL/min (by C-G formula based on SCr of 0.45 mg/dL).  Liver Function Tests: Recent Labs  Lab 06/29/2022 1555 07/21/22 1126 07/23/22 0819 07/25/22 0757  AST '17 20 20 24  '$ ALT '13 12 12 16  '$ ALKPHOS 95 77 70 72  BILITOT 0.5 0.4 0.6 0.6  PROT 5.8* 5.2* 4.9* 5.3*  ALBUMIN 2.1* 1.8* 2.0* 2.2*    No results for input(s): "LIPASE", "AMYLASE" in the last 168 hours. Recent Labs  Lab 07/22/22 0652  AMMONIA 27     Coagulation Profile: No results for input(s): "INR", "PROTIME" in the last 168 hours.  Cardiac Enzymes: No results for input(s): "CKTOTAL", "CKMB", "CKMBINDEX", "TROPONINI" in the last 168 hours.  BNP (last 3 results) No results for input(s): "PROBNP" in the last 8760 hours.  Lipid Profile: No results for input(s): "CHOL", "HDL", "LDLCALC", "TRIG", "CHOLHDL", "LDLDIRECT" in the last 72 hours.  Thyroid Function Tests: No results for input(s): "TSH", "T4TOTAL", "FREET4", "T3FREE", "THYROIDAB" in the last 72 hours.  Anemia Panel: No results for input(s): "VITAMINB12", "FOLATE", "FERRITIN", "TIBC", "IRON", "RETICCTPCT" in the last 72 hours.   Urine analysis:    Component Value Date/Time    COLORURINE YELLOW 07/21/2022 1311   APPEARANCEUR CLEAR 07/21/2022 1311   LABSPEC >1.030 (H) 07/21/2022 1311   PHURINE 5.5 07/21/2022 1311   GLUCOSEU NEGATIVE 07/21/2022 1311   HGBUR NEGATIVE 07/21/2022 1311   BILIRUBINUR NEGATIVE 07/21/2022 1311   KETONESUR 15 (A) 07/21/2022 1311   PROTEINUR 30 (A) 07/21/2022 1311   UROBILINOGEN 0.2 02/21/2012 0055   NITRITE NEGATIVE 07/21/2022 1311   LEUKOCYTESUR NEGATIVE 07/21/2022 1311    Sepsis Labs: Lactic Acid, Venous    Component Value Date/Time   LATICACIDVEN 1.5 07/21/2022 1416    MICROBIOLOGY: Recent Results (from the past 240 hour(s))  Resp panel by RT-PCR (RSV, Flu A&B, Covid) Anterior Nasal Swab     Status: None   Collection Time: 07/18/2022  5:15 PM   Specimen: Anterior Nasal Swab  Result Value Ref Range Status   SARS Coronavirus 2 by RT PCR NEGATIVE NEGATIVE Final    Comment: (NOTE) SARS-CoV-2 target nucleic acids are NOT DETECTED.  The SARS-CoV-2 RNA is generally detectable in upper respiratory specimens during the acute phase of infection. The lowest concentration of SARS-CoV-2 viral copies this assay can detect is 138 copies/mL. A negative result does not preclude SARS-Cov-2 infection and should not be used as the sole basis for treatment or other patient management decisions. A negative result may occur with  improper specimen collection/handling, submission of specimen other than nasopharyngeal swab, presence of viral mutation(s) within the areas targeted by this assay, and inadequate number of viral copies(<138 copies/mL). A negative result must be combined with clinical observations, patient history, and epidemiological information. The expected result is Negative.  Fact Sheet for Patients:  EntrepreneurPulse.com.au  Fact Sheet for Healthcare Providers:  IncredibleEmployment.be  This test is no t yet approved or cleared by the [place] FDA and  has been authorized for  detection and/or diagnosis of SARS-CoV-2 by FDA under an Emergency Use Authorization (EUA). This EUA will remain  in effect (meaning this test can be used) for the duration of the COVID-19 declaration under Section 564(b)(1) of the Act, 21 U.S.C.section 360bbb-3(b)(1), unless the authorization is terminated  or revoked sooner.       Influenza A by PCR NEGATIVE NEGATIVE Final   Influenza B by PCR NEGATIVE NEGATIVE Final    Comment: (NOTE) The Xpert Xpress SARS-CoV-2/FLU/RSV plus assay is intended as an aid in the diagnosis of influenza from Nasopharyngeal swab specimens and should not be used as a sole basis for treatment. Nasal washings and aspirates are unacceptable for Xpert Xpress SARS-CoV-2/FLU/RSV testing.  Fact Sheet for Patients: EntrepreneurPulse.com.au  Fact Sheet for Healthcare Providers: IncredibleEmployment.be  This test is not yet approved or cleared by the [place] FDA and has been authorized for detection and/or diagnosis of SARS-CoV-2 by FDA under an Emergency Use Authorization (EUA). This EUA will remain in effect (meaning this test can be used) for the duration of the COVID-19 declaration under Section 564(b)(1) of the Act, 21 U.S.C. section 360bbb-3(b)(1), unless the authorization is terminated or revoked.     Resp Syncytial Virus by PCR NEGATIVE NEGATIVE Final    Comment: (NOTE) Fact Sheet for Patients: EntrepreneurPulse.com.au  Fact Sheet for Healthcare Providers: IncredibleEmployment.be  This test is not yet approved or cleared by the [place] FDA and has been authorized for detection and/or diagnosis of SARS-CoV-2 by FDA under an Emergency Use Authorization (EUA). This EUA will remain in effect (meaning this test can be used) for the duration of the COVID-19 declaration under Section 564(b)(1) of the Act, 21 U.S.C. section 360bbb-3(b)(1), unless the authorization is  terminated or revoked.  Performed at [facility], [address], [place], [place] [address]   Surgical pcr screen     Status: None   Collection Time: 07/20/22  4:59 PM   Specimen: Nasal Mucosa; Nasal Swab  Result Value Ref Range Status   MRSA, PCR NEGATIVE NEGATIVE Final   Staphylococcus aureus NEGATIVE NEGATIVE Final    Comment: (NOTE) The Xpert SA Assay (FDA approved for NASAL specimens in patients 49 years of age and older), is one component of a comprehensive surveillance program. It is not intended to diagnose infection nor to guide or monitor treatment. Performed at [facility], [place] [address], [place], [place] [address]   Culture, blood (  Routine X 2) w Reflex to ID Panel     Status: None (Preliminary result)   Collection Time: 07/21/22 11:26 AM   Specimen: BLOOD LEFT ARM  Result Value Ref Range Status   Specimen Description BLOOD LEFT ARM  Final   Special Requests   Final    BOTTLES DRAWN AEROBIC AND ANAEROBIC Blood Culture adequate volume   Culture   Final    NO GROWTH 4 DAYS Performed at [facility], [address] [place], [place] [address]    Report Status PENDING  Incomplete  Culture, blood (Routine X 2) w Reflex to ID Panel     Status: None (Preliminary result)   Collection Time: 07/21/22 11:38 AM   Specimen: BLOOD LEFT HAND  Result Value Ref Range Status   Specimen Description BLOOD LEFT HAND  Final   Special Requests   Final    BOTTLES DRAWN AEROBIC AND ANAEROBIC Blood Culture adequate volume   Culture   Final    NO GROWTH 4 DAYS Performed at [facility], [place] [address], [place], [place] [address]    Report Status PENDING  Incomplete  Urine Culture     Status: None   Collection Time: 07/21/22  1:11 PM   Specimen: Urine, Random  Result Value Ref Range Status   Specimen Description URINE, RANDOM  Final   Special Requests NONE Reflexed from [id number]  Final   Culture   Final    NO GROWTH Performed at [facility], [place]  [address], [place], [place] [address]    Report Status 07/23/2022 FINAL  Final    RADIOLOGY STUDIES/RESULTS: [place] EKG SITE RITE  Result Date: 07/24/2022 If Site Rite image not attached, placement could not be confirmed due to current cardiac rhythm.  MR BRAIN W WO CONTRAST  Result Date: 07/23/2022 CLINICAL DATA:  Neuro deficit, stroke suspected, seizure disorder. EXAM: MRI HEAD WITHOUT AND WITH CONTRAST TECHNIQUE: Multiplanar, multiecho pulse sequences of the brain and surrounding structures were obtained without and with intravenous contrast. CONTRAST:  41m GADAVIST GADOBUTROL 1 MMOL/ML IV SOLN COMPARISON:  Brain MRI 12/15/2020 FINDINGS: Brain: There are numerous enhancing lesions in the brain most suspicious for metastatic disease: *0.7 cm x 0.6 cm lesion in the right middle frontal gyrus with mild surrounding edema (11-96). *0.9 cm x 0.7 cm lesion in the right corona radiata with mild surrounding edema and intralesional hemorrhage (11-91). *Punctate lesion in the right cingulate gyrus (11-93). *0.7 cm x 0.6 cm lesion in the right aspect of the splenium of the corpus callosum (11-87). *0.9 cm x 0.9 cm lesion in the right frontal lobe white matter anteriorly with mild surrounding edema and intralesional hemorrhage (11-76). *0.4 cm lesion in the right anteroinferior frontal lobe with mild surrounding edema (11-63). *0.3 cm lesion in the right temporal lobe cortex inferiorly (11-44). *0.6 cm x 0.6 cm lesion in the right anterior temporal lobe with mild surrounding edema and intralesional hemorrhage (11-37). *Ill-defined 0.3 cm lesion in the left frontal opercular white matter (11-71). *1.1 cm x 1.0 cm lesion in the left temporal lobe laterally with mild surrounding edema and intralesional hemorrhage (11-61). *0.4 cm x 0.4 cm lesion in the left temporal lobe cortex anterolaterally (11-37). *2.4 cm x 1.6 cm lesion in the right cerebellar hemisphere with surrounding edema and intralesional hemorrhage (11-33). *1.3 cm x  0.8 cm lesion in the left cerebellar hemisphere with mild surrounding edema (11-36). *0.5 cm lesion in the inferomedial left cerebellar hemisphere (11-19). *Additional  punctate lesions in the bilateral cerebellar hemispheres (11-17, 18). *No definite leptomeningeal disease is seen. *There is no significant mass effect caused by the above-described lesions or midline shift. There is no evidence of acute infarct. There is no acute intracranial hemorrhage or extra-axial fluid collection, aside from the intralesional hemorrhage described above. The pituitary and suprasellar region are normal. Vascular: Normal flow voids. Skull and upper cervical spine: Normal marrow signal. Sinuses/Orbits: The paranasal sinuses are clear. The globes and orbits are unremarkable. Other: There are solid lesions in the soft tissues of the upper neck measuring up to 2.0 cm x 1.5 cm on the right (3-6), 1.7 cm x 1.3 cm just to the right of midline posteriorly (3-9), and 1.2 cm x 1.1 cm to the left of midline (3-16), suspicious for pathologic lymph nodes. IMPRESSION: 1. Numerous enhancing lesions in the supratentorial and infratentorial brain as detailed above consistent with metastatic disease. There is mild edema surrounding the larger lesions and some intralesional hemorrhage but no significant mass effect or midline shift. At least 16 lesions are identified. 2. Solid nodules in the soft tissues of the imaged upper neck are indeterminate but suspicious for metastatic lymphadenopathy. Dedicated CT neck could be considered for better evaluation. Electronically Signed   By: Valetta Mole M.D.   On: 07/23/2022 11:18   Overnight EEG with video  Result Date: 07/23/2022 Lora Havens, MD     07/24/2022  9:35 AM Patient Name: KEILANA MAYERHOFER MRN: [id number] Epilepsy Attending: Lora Havens Referring Physician/Provider: Derek Jack, MD Duration: 07/22/2022 [id number] to 07/23/2022 0941  Patient history: 62yo F with ams. EEG to evaluate for seizure   Level of alertness: lethargic  AEDs during EEG study: LEV  Technical aspects: This EEG study was done with scalp electrodes positioned according to the 10-20 International system of electrode placement. Electrical activity was reviewed with band pass filter of 1-'70Hz'$ , sensitivity of 7 uV/mm, display speed of 57m/sec with a '60Hz'$  notched filter applied as appropriate. EEG data were recorded continuously and digitally stored.  Video monitoring was available and reviewed as appropriate.  Description: No clear posterior dominant rhythm was seen. EEG showed continuous generalized  3 to 6 Hz theta-delta slowing. Generalized periodic discharges with triphasic morphology were also noted with fluctuating frequency of 0.5 to '2hz'$ , predominantly when awake/stimulated. Hyperventilation and photic stimulation were not performed.   Event button was pressed on 07/22/2022 at 1105. Patient was noted to be raising her ams. Concomitant eeg showed generalized periodic discharges with triphasic morphology at 0.5-'1hz'$  without definite evolution. Event button was pressed on 07/22/2022 at 1Evans left arm was raised, had tremor like movements. Right amr was flexed and abducted, also with tremor like movements.  Concomitant eeg showed generalized periodic discharges with triphasic morphology at 1-1.'5hz'$  without definite evolution. Event button was pressed on 07/22/2022 at 2305 for arm movements. Concomitant eeg showed generalized periodic discharges with triphasic morphology at 1-1.'5hz'$  without definite evolution.  ABNORMALITY - Periodic discharges with triphasic morphology, generalized ( GPDs) - Continuous slow, generalized  IMPRESSION: This study showed generalized periodic discharges with triphasic morphology which can be on the ictal-interictal continuum. However, the frequency, morphology and reactivity to stimulation is more commonly seen  secondary to toxic-metabolic causes like uremia, cefepime toxicity.  Additionally there is moderate to  severe diffuse encephalopathy. No seizures were seen throughout the recording. Multiple events of arm movements were noted as described above without concomitant eeg change. These events were not epileptic. Priyanka OBarbra Sarks  LOS: 6 days   Oren Binet, MD  [facility]    To contact the attending provider between 7A-7P or the covering provider during after hours 7P-7A, please log into the web site [email or web address] and access using universal [place] password for that web site. If you do not have the password, please call the hospital operator.  07/25/2022, 9:08 AM

## 2022-07-26 DIAGNOSIS — R569 Unspecified convulsions: Secondary | ICD-10-CM | POA: Diagnosis not present

## 2022-07-26 DIAGNOSIS — Z515 Encounter for palliative care: Secondary | ICD-10-CM | POA: Diagnosis not present

## 2022-07-26 DIAGNOSIS — Z66 Do not resuscitate: Secondary | ICD-10-CM | POA: Diagnosis not present

## 2022-07-26 DIAGNOSIS — E440 Moderate protein-calorie malnutrition: Secondary | ICD-10-CM | POA: Insufficient documentation

## 2022-07-26 DIAGNOSIS — Z7189 Other specified counseling: Secondary | ICD-10-CM | POA: Diagnosis not present

## 2022-07-26 DIAGNOSIS — C7931 Secondary malignant neoplasm of brain: Secondary | ICD-10-CM | POA: Diagnosis not present

## 2022-07-26 DIAGNOSIS — M4804 Spinal stenosis, thoracic region: Secondary | ICD-10-CM | POA: Diagnosis not present

## 2022-07-26 DIAGNOSIS — G9520 Unspecified cord compression: Secondary | ICD-10-CM | POA: Diagnosis not present

## 2022-07-26 DIAGNOSIS — I10 Essential (primary) hypertension: Secondary | ICD-10-CM | POA: Diagnosis not present

## 2022-07-26 LAB — PHOSPHORUS: Phosphorus: 2.6 mg/dL (ref 2.5–4.6)

## 2022-07-26 LAB — GLUCOSE, CAPILLARY
Glucose-Capillary: 182 mg/dL — ABNORMAL HIGH (ref 70–99)
Glucose-Capillary: 199 mg/dL — ABNORMAL HIGH (ref 70–99)
Glucose-Capillary: 200 mg/dL — ABNORMAL HIGH (ref 70–99)
Glucose-Capillary: 202 mg/dL — ABNORMAL HIGH (ref 70–99)
Glucose-Capillary: 234 mg/dL — ABNORMAL HIGH (ref 70–99)

## 2022-07-26 LAB — BASIC METABOLIC PANEL
Anion gap: 12 (ref 5–15)
BUN: 10 mg/dL (ref 6–20)
CO2: 16 mmol/L — ABNORMAL LOW (ref 22–32)
Calcium: 7.8 mg/dL — ABNORMAL LOW (ref 8.9–10.3)
Chloride: 106 mmol/L (ref 98–111)
Creatinine, Ser: 0.74 mg/dL (ref 0.44–1.00)
GFR, Estimated: >60 mL/min (ref >60)
Glucose, Bld: 190 mg/dL — ABNORMAL HIGH (ref 70–99)
Potassium: 5 mmol/L (ref 3.5–5.1)
Sodium: 134 mmol/L — ABNORMAL LOW (ref 135–145)

## 2022-07-26 LAB — CBC
HCT: 31.2 % — ABNORMAL LOW (ref 36.0–46.0)
Hemoglobin: 10.3 g/dL — ABNORMAL LOW (ref 12.0–15.0)
MCH: 30.5 pg (ref 26.0–34.0)
MCHC: 33 g/dL (ref 30.0–36.0)
MCV: 92.3 fL (ref 80.0–100.0)
Platelets: 453 10*3/uL — ABNORMAL HIGH (ref 150–400)
RBC: 3.38 MIL/uL — ABNORMAL LOW (ref 3.87–5.11)
RDW: 17.6 % — ABNORMAL HIGH (ref 11.5–15.5)
WBC: 17.9 10*3/uL — ABNORMAL HIGH (ref 4.0–10.5)
nRBC: 0 % (ref 0.0–0.2)

## 2022-07-26 LAB — CULTURE, BLOOD (ROUTINE X 2)
Culture: NO GROWTH
Culture: NO GROWTH
Special Requests: ADEQUATE
Special Requests: ADEQUATE

## 2022-07-26 LAB — MAGNESIUM: Magnesium: 2.2 mg/dL (ref 1.7–2.4)

## 2022-07-26 MED ORDER — LORAZEPAM 2 MG/ML IJ SOLN
1.0000 mg | INTRAMUSCULAR | Status: DC | PRN
  Administered 2022-07-26 – 2022-07-27 (×4): 1 mg via INTRAVENOUS
  Filled 2022-07-26 (×4): qty 1

## 2022-07-26 MED ORDER — HYDROMORPHONE HCL 1 MG/ML IJ SOLN
2.0000 mg | INTRAMUSCULAR | Status: DC | PRN
  Administered 2022-07-26 – 2022-07-27 (×6): 2 mg via INTRAVENOUS
  Filled 2022-07-26 (×6): qty 2

## 2022-07-26 MED ORDER — INSULIN ASPART 100 UNIT/ML IJ SOLN
0.0000 [IU] | INTRAMUSCULAR | Status: DC
  Administered 2022-07-26: 2 [IU] via SUBCUTANEOUS
  Administered 2022-07-26 – 2022-07-27 (×2): 3 [IU] via SUBCUTANEOUS
  Administered 2022-07-27: 1 [IU] via SUBCUTANEOUS
  Administered 2022-07-27: 2 [IU] via SUBCUTANEOUS
  Administered 2022-07-27: 5 [IU] via SUBCUTANEOUS

## 2022-07-26 MED ORDER — DILTIAZEM HCL ER COATED BEADS 120 MG PO CP24
120.0000 mg | ORAL_CAPSULE | Freq: Every day | ORAL | Status: DC
  Administered 2022-07-26 – 2022-07-28 (×3): 120 mg via ORAL
  Filled 2022-07-26 (×3): qty 1

## 2022-07-26 NOTE — Inpatient Diabetes Management (Addendum)
Inpatient Diabetes Program Recommendations  AACE/ADA: New Consensus Statement on Inpatient Glycemic Control (2015)  Target Ranges:  Prepandial:   less than 140 mg/dL      Peak postprandial:   less than 180 mg/dL (1-2 hours)      Critically ill patients:  140 - 180 mg/dL    Latest Reference Range & Units 07/25/22 19:53 07/25/22 23:12 07/26/22 03:16  Glucose-Capillary 70 - 99 mg/dL 233 (H) 234 (H) 199 (H)  (H): Data is abnormally high    Metastatic melanoma with T8/T9 pathologic fracture  Cord compression at T8-T9 level Epidural tumor with myelopathy S/p T8-T9 decompressive laminectomy with resection of tumor on 2/28 Widespread metastatic melanoma with brain mets   MD- Note pt getting Decadron 2 mg Q8 hours  Orders to check CBGs Q4 hours  Please consider Starting Novolog Sensitive Correction Scale/ SSI (0-9 units) Q4 hours while pt getting Steroids (if within goals of care)     --Will follow patient during hospitalization--  Wyn Quaker RN, MSN, [place] Diabetes Coordinator Inpatient Glycemic Control Team Team Pager: [phone] (8a-5p)

## 2022-07-26 NOTE — Progress Notes (Signed)
This chaplain responded to spiritual care consult for supporting the family. The Pt. Is sleeping at the time of the visit. Family is not present.  The chaplain is appreciative of the RN's update on family visits. This chaplain is also working with PMT to discern best time for a visit.  Chaplain Sallyanne Kuster [phone]

## 2022-07-26 NOTE — Progress Notes (Signed)
Providing Compassionate, Quality Care - Together   Subjective: Patient somnolent. She will open eyes to voice, but is not interactive.  Objective: Vital signs in last 24 hours: Temp:  [97.6 F (36.4 C)-98.7 F (37.1 C)] 97.6 F (36.4 C) (03/05 0756) Pulse Rate:  [70-103] 98 (03/05 0756) Resp:  [12-20] 16 (03/05 0756) BP: (103-162)/(59-83) 162/68 (03/05 0756) SpO2:  [98 %-100 %] 98 % (03/05 0756) Weight:  [68.1 kg] 68.1 kg (03/05 0500)  Intake/Output from previous day: 03/04 0701 - 03/05 0700 In: 3351 [P.O.:120; I.V.:2265.9; NG/GT:261.3; IV Piggyback:703.7] Out: 900 [Urine:900] Intake/Output this shift: No intake/output data recorded.  Somnolent. Will open eyes with repeated stimulation. PERRLA, sluggish Unable to follow commands presently MAE, appears purposeful  Lab Results: Recent Labs    07/25/22 0757 07/26/22 0452  WBC 18.9* 17.9*  HGB 8.9* 10.3*  HCT 27.2* 31.2*  PLT 471* 453*   BMET Recent Labs    07/25/22 0757 07/26/22 0452  NA 135 134*  K 3.2* 5.0  CL 104 106  CO2 21* 16*  GLUCOSE 154* 190*  BUN 11 10  CREATININE 0.45 0.74  CALCIUM 7.7* 7.8*    Studies/Results: DG Abd Portable 1V  Result Date: 07/25/2022 CLINICAL DATA:  [id number] Encounter for feeding tube placement [id number] EXAM: PORTABLE ABDOMEN - 1 VIEW COMPARISON:  07/03/2022 FINDINGS: Limited radiograph of the lower chest and upper abdomen was obtained for the purposes of enteric tube localization. Enteric tube is seen coursing below the diaphragm with distal tip and terminating within the expected location of the gastric body. IMPRESSION: Enteric tube tip projects within the expected location of the gastric body. Electronically Signed   By: Davina Poke D.O.   On: 07/25/2022 12:43   ECHOCARDIOGRAM COMPLETE  Result Date: 07/25/2022    ECHOCARDIOGRAM REPORT   Patient Name:   Theresa Pace Date of Exam: 07/25/2022 Medical Rec #:  [id number]     Height:       62.0 in Accession #:    [id number]     Weight:       149.9 lb Date of Birth:  Dec 03, 1960     BSA:          1.691 m Patient Age:    34 years      BP:           136/84 mmHg Patient Gender: F             HR:           84 bpm. Exam Location:  Inpatient Procedure: 2D Echo, Color Doppler and Cardiac Doppler Indications:    I48.91* Unspeicified atrial fibrillation  History:        Patient has prior history of Echocardiogram examinations, most                 recent 06/26/2019. Abnormal ECG, COPD, Arrythmias:Tachycardia,                 Signs/Symptoms:Shortness of Breath, Dyspnea and Altered Mental                 Status; Risk Factors:Hypertension and Current Smoker. Metastatic                 disease.  Sonographer:    Roseanna Rainbow RDCS Referring Phys: [place]  1. Difficult acoustic windows. Definity uses Septal hypokinesis. . Left ventricular ejection fraction, by estimation, is 55 to 60%. The left ventricle has normal function. Left ventricular diastolic parameters are  indeterminate.  2. Right ventricular systolic function is normal. The right ventricular size is normal.  3. Trivial mitral valve regurgitation.  4. The aortic valve is tricuspid. Aortic valve regurgitation is not visualized.  5. The inferior vena cava is normal in size with greater than 50% respiratory variability, suggesting right atrial pressure of 3 mmHg. Comparison(s): The left ventricular function is unchanged. FINDINGS  Left Ventricle: Difficult acoustic windows. Definity uses Septal hypokinesis. Left ventricular ejection fraction, by estimation, is 55 to 60%. The left ventricle has normal function. The left ventricular internal cavity size was normal in size. There is  no left ventricular hypertrophy. Left ventricular diastolic parameters are indeterminate. Right Ventricle: The right ventricular size is normal. Right vetricular wall thickness was not assessed. Right ventricular systolic function is normal. Left Atrium: Left atrial size was normal in size. Right  Atrium: Right atrial size was normal in size. Pericardium: There is no evidence of pericardial effusion. Mitral Valve: There is mild thickening of the mitral valve leaflet(s). Trivial mitral valve regurgitation. Tricuspid Valve: The tricuspid valve is normal in structure. Tricuspid valve regurgitation is trivial. Aortic Valve: The aortic valve is tricuspid. Aortic valve regurgitation is not visualized. Pulmonic Valve: The pulmonic valve was not well visualized. Pulmonic valve regurgitation is not visualized. No evidence of pulmonic stenosis. Aorta: The aortic root and ascending aorta are structurally normal, with no evidence of dilitation. Venous: The inferior vena cava is normal in size with greater than 50% respiratory variability, suggesting right atrial pressure of 3 mmHg. IAS/Shunts: No atrial level shunt detected by color flow Doppler.  LEFT VENTRICLE PLAX 2D LVIDd:         4.40 cm     Diastology LVIDs:         3.20 cm     LV e' medial:    6.20 cm/s LV PW:         1.00 cm     LV E/e' medial:  14.2 LV IVS:        0.90 cm     LV e' lateral:   7.62 cm/s LVOT diam:     2.30 cm     LV E/e' lateral: 11.6 LV SV:         82 LV SV Index:   48 LVOT Area:     4.15 cm  LV Volumes (MOD) LV vol d, MOD A2C: 95.6 ml LV vol d, MOD A4C: 95.5 ml LV vol s, MOD A2C: 45.2 ml LV vol s, MOD A4C: 37.0 ml LV SV MOD A2C:     50.3 ml LV SV MOD A4C:     95.5 ml LV SV MOD BP:      54.9 ml RIGHT VENTRICLE             IVC RV S prime:     15.00 cm/s  IVC diam: 1.20 cm TAPSE (M-mode): 1.2 cm LEFT ATRIUM             Index        RIGHT ATRIUM           Index LA diam:        3.30 cm 1.95 cm/m   RA Area:     12.40 cm LA Vol (A2C):   39.5 ml 23.36 ml/m  RA Volume:   27.30 ml  16.14 ml/m LA Vol (A4C):   38.0 ml 22.47 ml/m LA Biplane Vol: 40.3 ml 23.83 ml/m  AORTIC VALVE LVOT Vmax:   111.00 cm/s LVOT Vmean:  70.400 cm/s LVOT VTI:    0.197 m  AORTA Ao Root diam: 3.50 cm Ao Asc diam:  2.80 cm MITRAL VALVE MV Area (PHT): 4.58 cm    SHUNTS MV  Decel Time: 166 msec    Systemic VTI:  0.20 m MV E velocity: 88.25 cm/s  Systemic Diam: 2.30 cm MV A velocity: 76.55 cm/s MV E/A ratio:  1.15 Dorris Carnes MD Electronically signed by Dorris Carnes MD Signature Date/Time: 07/25/2022/11:45:28 AM    Final     Assessment/Plan: Patient underwent a decompressive laminectomy at T8 and T9 due to pathologic fracture and debulking of epidural tumor by Dr. Annette Stable on 07/20/2022. Unfortunately, she had a seizure on 07/22/2022 and an MRI revealed multiple brain metastases. Palliative care has been consulted and the family has discussed goals of care. The patient has been made a DNR. The family would like to see how the patient does over the next few days prior to looking into hospice options. Continue supportive efforts.     LOS: 7 days     Viona Gilmore, DNP, AGNP-C Nurse Practitioner  [facility] [place] [address] [place], [place], [place] [address] P: [phone]    F: [phone]  07/26/2022, 10:44 AM

## 2022-07-26 NOTE — Progress Notes (Signed)
Speech Language Pathology Treatment: Dysphagia  Patient Details Name: Theresa Pace MRN: [id number] DOB: 11-Nov-1960 Today's Date: 07/26/2022 Time: [id number] SLP Time Calculation (min) (ACUTE ONLY): 16 min  Assessment / Plan / Recommendation Clinical Impression  Pt appeared to be in pain throughout session evidenced by groaning and grimacing. Nursing made aware. Pt's son at bedside reported no difficulty with breakfast tray today. She did not communicate verbally and did not follow commands. Pt was observed with trials of ice chips, straw sips of thin liquids, and purees with no overt s/s of aspiration. She required cues to attend to PO presentations; however, mastication and oral manipulation of ice chips and purees appeared improved. Due to overall awareness of POs, recommend continuing Dys 1 (puree) diet with thin liquids and meds crushed in puree. Pt will require full supervision and assist with feeding to ensure safety. SLP will continue to f/u to assess for potential diet advancement as awareness improves.    HPI HPI: Pt is a 62 year old who presented with worsening back pain and lower extremity weakness. MRI brain 3/2: Numerous enhancing lesions (16 identified) in the supratentorial and infratentorial brain consistent with metastatic disease. Mild edema surrounding the larger lesions and some intralesional hemorrhage. 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 Pt was taken to the OR on 2/28 for decompression by neurosurgery. Dx Acute toxic metabolic encephalopathy. PMH: metastatic melanoma, COPD, chronic pain syndrome, ADHD-who is currently undergoing immunotherapy for metastatic melanoma. BSE 12/15/20: WNL; regular/thin recommended. BSE in 2022; regular/thin recommended.      SLP Plan  Continue with current plan of care      Recommendations for follow up therapy are one component of a multi-disciplinary discharge planning process, led by the  attending physician.  Recommendations may be updated based on patient status, additional functional criteria and insurance authorization.    Recommendations  Diet recommendations: Dysphagia 1 (puree);Thin liquid Liquids provided via: Straw;Cup Medication Administration: Crushed with puree Supervision: Full supervision/cueing for compensatory strategies;Staff to assist with self feeding;Trained caregiver to feed patient Compensations: Slow rate;Small sips/bites Postural Changes and/or Swallow Maneuvers: Seated upright 90 degrees;Upright 30-60 min after meal                Oral Care Recommendations: Oral care BID Follow Up Recommendations: Skilled nursing-short term rehab (<3 hours/day) Assistance recommended at discharge: Frequent or constant Supervision/Assistance SLP Visit Diagnosis: Dysphagia, unspecified (R13.10) Plan: Continue with current plan of care           Fabio Asa., Student SLP  07/26/2022, 12:21 PM

## 2022-07-26 NOTE — Progress Notes (Addendum)
Palliative:  HPI: 62 y.o. 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 Hospitalization complicated by new finding of brain metastasis with symptoms of confusion and seizures.    I met today at [facility] bedside initially with her son, Heron Sabins. Joey is at [facility] bedside trying to calm and distract her. She is restless and agitated and confirms she has pain. RN notified for pain medication. Joey shares that he understands her condition and expectations and confirms his father will be here later today.   I returned to bedside and I was able to speak with husband, Ovid Curd, and [place] privately outside room. We reviewed Savi's condition and overall poor prognosis. They both have good understanding. We discussed path forward with priority that she does not suffer. They wish to give her a couple more days to see if she can exhibit any improvement. They are open to hospice if she does not improve. Ovid Curd understands the limited options moving forward and knows that Younique's prognosis from her cancer is extremely poor. We discussed hospice at home vs [facility] facility - discussed the benefits of both options. We also discussed code status and Ovid Curd confirms desire for DNR status.   They tell me that Shatana loves her garden and flowers. They enjoy vacationing at the beach - particularly [place]. She loves her family. Ovid Curd reports that they have many people praying for her. Ovid Curd is open to chaplain visit for support and prayer.   All questions/concerns addressed. Emotional support provided.   Exam: Restless, agitated, pain - relieved with dilaudid + Ativan combination. Will answer some simple questions or respond to family "I love you." Not following commands. Tachycardic. Breathing regular, unlabored. Abd  soft.   Plan: - DNR decided.  - Continue current interventions with hopes of improvement.  - If no improvement over the next couple days family open to considering hospice options.  - Liberalized PRN medication to ensure comfort. Low threshold to add scheduled regimen.   [place], NP Palliative Medicine Team Pager [phone] (Please see [email or web address] for schedule) Team Phone [phone]    Greater than 50%  of this time was spent counseling and coordinating care related to the above assessment and plan

## 2022-07-26 NOTE — Progress Notes (Signed)
PROGRESS NOTE        PATIENT DETAILS Name: Theresa Pace Age: 62 y.o. Sex: female Date of Birth: 1960-12-26 Admit Date: 06/24/2022 Admitting Physician Earnie Larsson, MD [id number], [facility]  Brief Summary: 62 year old with history of metastatic melanoma, COPD, chronic pain syndrome, ADHD-who is currently undergoing immunotherapy for metastatic melanoma-presented to the hospital with worsening back pain and lower extremity weakness-she evaluated with an MRI which showed pathological T8/T9 fractures and epidural extension of the tumor-she was taken to the OR on 2/28 for decompression by neurosurgery. TRH was consulted on 2/29 for evaluation of confusion-on 3/1-patient was found to have breakthrough seizures.  See below for further details.   Significant events: 2/27>> admit by neurosurgery 2/28>> T8-T9 decompressive laminectomy with resection of tumor 2/29>> consult to TRH-TRH assumed primary service. 3/01>> seizure-like activity-neurology consulted.  Significant studies: 2/27>> CT abdomen/pelvis: Diffuse progressive metastatic disease throughout lower chest/abdomen/pelvis. 2/27>> MRI T-spine: T8-T9 vertebral bodies compression fracture, extraosseous/epidural extension into the ventral epidural space at T9 with resultant severe spinal stenosis/cord compression. 2/27>> MRI L-spine: Multiple osseous mets involving lumbar spine, no extraosseous or epidural extension of tumor. 2/29>> CXR: Right mid lung alveolar process unchanged. 3/1-3/2>>LTM  EEG: No seizures. 3/02>> MRI brain: Numerous enhancing lesions in the supratentorial and infratentorial brain-consistent with metastatic disease. 3/04>> echo: EF 55-60%  Significant microbiology data: 2/27>> COVID/influenza/RSV PCR: Negative 2/29>> blood culture: No growth  Procedures: 2/28>> T8-T9 decompressive laminectomy with resection of tumor  Consults: Neurology Neurosurgery  Subjective: Confused-but  comfortable today.  Speaking in 1-2 word sentences-said "good morning".    Objective: Vitals: Blood pressure (!) 162/68, pulse 98, temperature 97.6 F (36.4 C), temperature source Oral, resp. rate 16, height '5\' 2"'$  (1.575 m), weight 68.1 kg, SpO2 98 %.   Exam: Gen Exam:Alert awake-not in any distress HEENT:atraumatic, normocephalic Chest: B/L clear to auscultation anteriorly CVS:S1S2 regular Abdomen:soft non tender, non distended Extremities:no edema Neurology: Non focal Skin: no rash  Pertinent Labs/Radiology:    Latest Ref Rng & Units 07/26/2022    4:52 AM 07/25/2022    7:57 AM 07/24/2022    7:51 AM  CBC  WBC 4.0 - 10.5 K/uL 17.9  18.9  12.9   Hemoglobin 12.0 - 15.0 g/dL 10.3  8.9  9.5   Hematocrit 36.0 - 46.0 % 31.2  27.2  29.4   Platelets 150 - 400 K/uL 453  471  383     Lab Results  Component Value Date   NA 134 (L) 07/26/2022   K 5.0 07/26/2022   CL 106 07/26/2022   CO2 16 (L) 07/26/2022     Assessment/Plan: Metastatic melanoma with T8/T9 pathologic fracture  Cord compression at T8-T9 level Epidural tumor with myelopathy S/p T8-T9 decompressive laminectomy with resection of tumor on 2/28 Widespread metastatic melanoma with brain mets Postoperative care deferred to neurosurgery Remains on IV Decadron Radiation oncology consult placed 3/4 Appears to have significant metastatic burden-palliative care following and engaging with family-will await further recommendations.  Acute toxic metabolic encephalopathy Breakthrough seizure Initially thought to have acute encephalopathy in the setting of postop status/narcotics/UTI-however had seizure-like activity on 3/1-and subsequently started on IV Keppra. Remains encephalopathic-although a bit better today.  Suspect this is multifactorial from brain mets/postop status/pain/hospital delirium.  Remains on IV Keppra QTc significantly prolonged-unable to use Seroquel/Haldol-hence has been started on  Depakote. Encephalopathy/restlessness improves with Dilaudid per nursing staff.  Ativan/Haldol has not worked over the past several days. Maintain strict aspiration precautions.  Hypokalemia/hypomagnesemia Replete/recheck  Prolonged QTc Keep K>4. Mg>2 Telemetry monitoring Avoid QTc prolonging agents  Normocytic anemia Likely due to acute illness/underlying malignancy Follow CBC  COPD Not in exacerbation Continue bronchodilators   Complicated UTI Completed course of IV Rocephin  ?  PAF/MAT Continues to have episodes of narrow complex tachycardia Continue IV beta-blocker Resume long-acting Cardizem Echo stable EF. Not safe for anticoagulation given recent thoracic spine surgery.    HLD Statin   Chronic pain syndrome IV Dilaudid for severe pain, Norco for moderate pain On chronic narcotics at home   HTN Relatively stable Now on IV metoprolol. Resume Cardizem  ADHD/fibromyalgia/depression Trazodone held for prolonged QTc Holding Adderall  Failure to thrive syndrome Palliative care Per spouse-had decreased oral intake given prior to this hospitalization Per nursing staff-no significant oral intake over the past several days due to severe encephalopathy S/p Cortrak tube placement on 3/4-continue feedings for now.  Encourage oral intake. Palliative care following-await further recommendations.  Nutrition Status: Nutrition Problem: Moderate Malnutrition Etiology: chronic illness (metastatic melanoma) Signs/Symptoms: mild fat depletion, moderate fat depletion, moderate muscle depletion, severe muscle depletion Interventions: Refer to RD note for recommendations, Tube feeding    BMI: Estimated body mass index is 27.46 kg/m as calculated from the following:   Height as of this encounter: '5\' 2"'$  (1.575 m).   Weight as of this encounter: 68.1 kg.   Code status:   Code Status: Full Code   DVT Prophylaxis: SCD's Start: 07/20/22 2118 SCDs Start: 07/06/2022 2316     Family Communication: Spouse-[phone]-updated 3/5   Disposition Plan: Status is: Inpatient Remains inpatient appropriate because: Severity of illness   Planned Discharge Destination:Skilled nursing facility   Diet: Diet Order             DIET - DYS 1 Room service appropriate? No; Fluid consistency: Thin  Diet effective now                     Antimicrobial agents: Anti-infectives (From admission, onward)    Start     Dose/Rate Route Frequency Ordered Stop   07/22/22 0915  cefTRIAXone (ROCEPHIN) 2 g in sodium chloride 0.9 % 100 mL IVPB        2 g 200 mL/hr over 30 Minutes Intravenous Every 24 hours 07/21/22 1016 07/24/22 1034   07/21/22 0915  cefTRIAXone (ROCEPHIN) 1 g in sodium chloride 0.9 % 100 mL IVPB  Status:  Discontinued        1 g 200 mL/hr over 30 Minutes Intravenous Every 24 hours 07/21/22 0821 07/21/22 1016   07/20/22 2215  ceFAZolin (ANCEF) IVPB 1 g/50 mL premix        1 g 100 mL/hr over 30 Minutes Intravenous Every 8 hours 07/20/22 2118 07/21/22 0610   07/20/22 1007  ceFAZolin (ANCEF) IVPB 2g/100 mL premix        2 g 200 mL/hr over 30 Minutes Intravenous 30 min pre-op 07/20/22 1007 07/20/22 1810        MEDICATIONS: Scheduled Meds:  arformoterol  15 mcg Nebulization BID   baclofen  10 mg Oral TID   budesonide (PULMICORT) nebulizer solution  0.25 mg Nebulization BID   dexamethasone (DECADRON) injection  2 mg Intravenous Q8H   divalproex  125 mg Oral Q12H   docusate sodium  100 mg Oral BID   melatonin  5 mg Oral QHS   metoprolol tartrate  5 mg Intravenous Q6H  pantoprazole  40 mg Oral Daily   polyethylene glycol  17 g Oral Daily   revefenacin  175 mcg Nebulization Daily   rosuvastatin  20 mg Oral QPM   senna-docusate  2 tablet Oral QHS   sodium chloride flush  10-40 mL Intracatheter Q12H   umeclidinium bromide  1 puff Inhalation Daily   Continuous Infusions:  sodium chloride 50 mL/hr at 07/25/22 1811   feeding supplement  (OSMOLITE 1.2 CAL) 20 mL/hr at 07/25/22 1811   levETIRAcetam 500 mg (07/26/22 0838)   methocarbamol (ROBAXIN) IV Stopped (07/24/22 1630)   PRN Meds:.acetaminophen **OR** acetaminophen, albuterol, bisacodyl, HYDROcodone-acetaminophen, HYDROmorphone (DILAUDID) injection, LORazepam, menthol-cetylpyridinium **OR** phenol, methocarbamol (ROBAXIN) IV, metoprolol tartrate, naLOXone (NARCAN)  injection, ondansetron (ZOFRAN) IV **OR** ondansetron, sodium chloride flush, sodium phosphate   I have personally reviewed following labs and imaging studies  LABORATORY DATA: CBC: Recent Labs  Lab 07/12/2022 1555 07/20/22 0055 07/21/22 1126 07/22/22 0652 07/23/22 0819 07/24/22 0751 07/25/22 0757 07/26/22 0452  WBC 11.1*   < > 15.1* 10.7* 11.9* 12.9* 18.9* 17.9*  NEUTROABS 8.6*  --  13.3*  --   --   --   --   --   HGB 11.1*   < > 9.8* 9.3* 8.8* 9.5* 8.9* 10.3*  HCT 34.7*   < > 30.1* 28.9* 28.7* 29.4* 27.2* 31.2*  MCV 94.0   < > 94.1 93.2 95.0 93.0 91.6 92.3  PLT 535*   < > 500* 465* 457* 383 471* 453*   < > = values in this interval not displayed.     Basic Metabolic Panel: Recent Labs  Lab 07/22/22 0652 07/23/22 0819 07/24/22 0751 07/25/22 0541 07/25/22 0757 07/26/22 0452  NA 139 143 141  --  135 134*  K 4.0 3.6 3.8  --  3.2* 5.0  CL 105 113* 113*  --  104 106  CO2 21* 19* 17*  --  21* 16*  GLUCOSE 153* 123* 104*  --  154* 190*  BUN 34* 27* 16  --  11 10  CREATININE 0.85 0.68 0.60  --  0.45 0.74  CALCIUM 7.3* 7.5* 7.6*  --  7.7* 7.8*  MG 2.3  --   --  1.9 1.7 2.2  PHOS  --   --   --   --  1.6* 2.6     GFR: Estimated Creatinine Clearance: 67.6 mL/min (by C-G formula based on SCr of 0.74 mg/dL).  Liver Function Tests: Recent Labs  Lab 07/10/2022 1555 07/21/22 1126 07/23/22 0819 07/25/22 0757  AST '17 20 20 24  '$ ALT '13 12 12 16  '$ ALKPHOS 95 77 70 72  BILITOT 0.5 0.4 0.6 0.6  PROT 5.8* 5.2* 4.9* 5.3*  ALBUMIN 2.1* 1.8* 2.0* 2.2*    No results for input(s): "LIPASE",  "AMYLASE" in the last 168 hours. Recent Labs  Lab 07/22/22 0652  AMMONIA 27     Coagulation Profile: No results for input(s): "INR", "PROTIME" in the last 168 hours.  Cardiac Enzymes: No results for input(s): "CKTOTAL", "CKMB", "CKMBINDEX", "TROPONINI" in the last 168 hours.  BNP (last 3 results) No results for input(s): "PROBNP" in the last 8760 hours.  Lipid Profile: No results for input(s): "CHOL", "HDL", "LDLCALC", "TRIG", "CHOLHDL", "LDLDIRECT" in the last 72 hours.  Thyroid Function Tests: No results for input(s): "TSH", "T4TOTAL", "FREET4", "T3FREE", "THYROIDAB" in the last 72 hours.  Anemia Panel: No results for input(s): "VITAMINB12", "FOLATE", "FERRITIN", "TIBC", "IRON", "RETICCTPCT" in the last 72 hours.   Urine analysis:  Component Value Date/Time   COLORURINE YELLOW 07/21/2022 1311   APPEARANCEUR CLEAR 07/21/2022 1311   LABSPEC >1.030 (H) 07/21/2022 1311   PHURINE 5.5 07/21/2022 1311   GLUCOSEU NEGATIVE 07/21/2022 1311   HGBUR NEGATIVE 07/21/2022 1311   BILIRUBINUR NEGATIVE 07/21/2022 1311   KETONESUR 15 (A) 07/21/2022 1311   PROTEINUR 30 (A) 07/21/2022 1311   UROBILINOGEN 0.2 02/21/2012 0055   NITRITE NEGATIVE 07/21/2022 1311   LEUKOCYTESUR NEGATIVE 07/21/2022 1311    Sepsis Labs: Lactic Acid, Venous    Component Value Date/Time   LATICACIDVEN 1.5 07/21/2022 1416    MICROBIOLOGY: Recent Results (from the past 240 hour(s))  Resp panel by RT-PCR (RSV, Flu A&B, Covid) Anterior Nasal Swab     Status: None   Collection Time: 07/16/2022  5:15 PM   Specimen: Anterior Nasal Swab  Result Value Ref Range Status   SARS Coronavirus 2 by RT PCR NEGATIVE NEGATIVE Final    Comment: (NOTE) SARS-CoV-2 target nucleic acids are NOT DETECTED.  The SARS-CoV-2 RNA is generally detectable in upper respiratory specimens during the acute phase of infection. The lowest concentration of SARS-CoV-2 viral copies this assay can detect is 138 copies/mL. A negative  result does not preclude SARS-Cov-2 infection and should not be used as the sole basis for treatment or other patient management decisions. A negative result may occur with  improper specimen collection/handling, submission of specimen other than nasopharyngeal swab, presence of viral mutation(s) within the areas targeted by this assay, and inadequate number of viral copies(<138 copies/mL). A negative result must be combined with clinical observations, patient history, and epidemiological information. The expected result is Negative.  Fact Sheet for Patients:  EntrepreneurPulse.com.au  Fact Sheet for Healthcare Providers:  IncredibleEmployment.be  This test is no t yet approved or cleared by the [place] FDA and  has been authorized for detection and/or diagnosis of SARS-CoV-2 by FDA under an Emergency Use Authorization (EUA). This EUA will remain  in effect (meaning this test can be used) for the duration of the COVID-19 declaration under Section 564(b)(1) of the Act, 21 U.S.C.section 360bbb-3(b)(1), unless the authorization is terminated  or revoked sooner.       Influenza A by PCR NEGATIVE NEGATIVE Final   Influenza B by PCR NEGATIVE NEGATIVE Final    Comment: (NOTE) The Xpert Xpress SARS-CoV-2/FLU/RSV plus assay is intended as an aid in the diagnosis of influenza from Nasopharyngeal swab specimens and should not be used as a sole basis for treatment. Nasal washings and aspirates are unacceptable for Xpert Xpress SARS-CoV-2/FLU/RSV testing.  Fact Sheet for Patients: EntrepreneurPulse.com.au  Fact Sheet for Healthcare Providers: IncredibleEmployment.be  This test is not yet approved or cleared by the [place] FDA and has been authorized for detection and/or diagnosis of SARS-CoV-2 by FDA under an Emergency Use Authorization (EUA). This EUA will remain in effect (meaning this test can be used)  for the duration of the COVID-19 declaration under Section 564(b)(1) of the Act, 21 U.S.C. section 360bbb-3(b)(1), unless the authorization is terminated or revoked.     Resp Syncytial Virus by PCR NEGATIVE NEGATIVE Final    Comment: (NOTE) Fact Sheet for Patients: EntrepreneurPulse.com.au  Fact Sheet for Healthcare Providers: IncredibleEmployment.be  This test is not yet approved or cleared by the [place] FDA and has been authorized for detection and/or diagnosis of SARS-CoV-2 by FDA under an Emergency Use Authorization (EUA). This EUA will remain in effect (meaning this test can be used) for the duration of the COVID-19 declaration under Section  564(b)(1) of the Act, 21 U.S.C. section 360bbb-3(b)(1), unless the authorization is terminated or revoked.  Performed at [facility], [address], [place], [place] [address]   Surgical pcr screen     Status: None   Collection Time: 07/20/22  4:59 PM   Specimen: Nasal Mucosa; Nasal Swab  Result Value Ref Range Status   MRSA, PCR NEGATIVE NEGATIVE Final   Staphylococcus aureus NEGATIVE NEGATIVE Final    Comment: (NOTE) The Xpert SA Assay (FDA approved for NASAL specimens in patients 29 years of age and older), is one component of a comprehensive surveillance program. It is not intended to diagnose infection nor to guide or monitor treatment. Performed at [facility], [place] [address], [place], [place] [address]   Culture, blood (Routine X 2) w Reflex to ID Panel     Status: None   Collection Time: 07/21/22 11:26 AM   Specimen: BLOOD LEFT ARM  Result Value Ref Range Status   Specimen Description BLOOD LEFT ARM  Final   Special Requests   Final    BOTTLES DRAWN AEROBIC AND ANAEROBIC Blood Culture adequate volume   Culture   Final    NO GROWTH 5 DAYS Performed at [facility], [place] [address] [place], [place] [address]    Report Status 07/26/2022 FINAL  Final  Culture, blood  (Routine X 2) w Reflex to ID Panel     Status: None   Collection Time: 07/21/22 11:38 AM   Specimen: BLOOD LEFT HAND  Result Value Ref Range Status   Specimen Description BLOOD LEFT HAND  Final   Special Requests   Final    BOTTLES DRAWN AEROBIC AND ANAEROBIC Blood Culture adequate volume   Culture   Final    NO GROWTH 5 DAYS Performed at [facility], [place] [address], [place], [place] [address]    Report Status 07/26/2022 FINAL  Final  Urine Culture     Status: None   Collection Time: 07/21/22  1:11 PM   Specimen: Urine, Random  Result Value Ref Range Status   Specimen Description URINE, RANDOM  Final   Special Requests NONE Reflexed from [id number]  Final   Culture   Final    NO GROWTH Performed at [facility], [place] [address] [place], [place] [address]    Report Status 07/23/2022 FINAL  Final    RADIOLOGY STUDIES/RESULTS: DG Abd Portable 1V  Result Date: 07/25/2022 CLINICAL DATA:  [id number] Encounter for feeding tube placement [id number] EXAM: PORTABLE ABDOMEN - 1 VIEW COMPARISON:  06/26/2022 FINDINGS: Limited radiograph of the lower chest and upper abdomen was obtained for the purposes of enteric tube localization. Enteric tube is seen coursing below the diaphragm with distal tip and terminating within the expected location of the gastric body. IMPRESSION: Enteric tube tip projects within the expected location of the gastric body. Electronically Signed   By: Davina Poke D.O.   On: 07/25/2022 12:43   ECHOCARDIOGRAM COMPLETE  Result Date: 07/25/2022    ECHOCARDIOGRAM REPORT   Patient Name:   Theresa Pace Date of Exam: 07/25/2022 Medical Rec #:  [id number]     Height:       62.0 in Accession #:    [id number]    Weight:       149.9 lb Date of Birth:  April 17, 1961     BSA:          1.691 m Patient Age:    62 years  BP:           136/84 mmHg Patient Gender: F             HR:           84 bpm. Exam Location:  Inpatient Procedure: 2D Echo, Color Doppler and Cardiac Doppler  Indications:    I48.91* Unspeicified atrial fibrillation  History:        Patient has prior history of Echocardiogram examinations, most                 recent 06/26/2019. Abnormal ECG, COPD, Arrythmias:Tachycardia,                 Signs/Symptoms:Shortness of Breath, Dyspnea and Altered Mental                 Status; Risk Factors:Hypertension and Current Smoker. Metastatic                 disease.  Sonographer:    Roseanna Rainbow RDCS Referring Phys: [place]  1. Difficult acoustic windows. Definity uses Septal hypokinesis. . Left ventricular ejection fraction, by estimation, is 55 to 60%. The left ventricle has normal function. Left ventricular diastolic parameters are indeterminate.  2. Right ventricular systolic function is normal. The right ventricular size is normal.  3. Trivial mitral valve regurgitation.  4. The aortic valve is tricuspid. Aortic valve regurgitation is not visualized.  5. The inferior vena cava is normal in size with greater than 50% respiratory variability, suggesting right atrial pressure of 3 mmHg. Comparison(s): The left ventricular function is unchanged. FINDINGS  Left Ventricle: Difficult acoustic windows. Definity uses Septal hypokinesis. Left ventricular ejection fraction, by estimation, is 55 to 60%. The left ventricle has normal function. The left ventricular internal cavity size was normal in size. There is  no left ventricular hypertrophy. Left ventricular diastolic parameters are indeterminate. Right Ventricle: The right ventricular size is normal. Right vetricular wall thickness was not assessed. Right ventricular systolic function is normal. Left Atrium: Left atrial size was normal in size. Right Atrium: Right atrial size was normal in size. Pericardium: There is no evidence of pericardial effusion. Mitral Valve: There is mild thickening of the mitral valve leaflet(s). Trivial mitral valve regurgitation. Tricuspid Valve: The tricuspid valve is normal in  structure. Tricuspid valve regurgitation is trivial. Aortic Valve: The aortic valve is tricuspid. Aortic valve regurgitation is not visualized. Pulmonic Valve: The pulmonic valve was not well visualized. Pulmonic valve regurgitation is not visualized. No evidence of pulmonic stenosis. Aorta: The aortic root and ascending aorta are structurally normal, with no evidence of dilitation. Venous: The inferior vena cava is normal in size with greater than 50% respiratory variability, suggesting right atrial pressure of 3 mmHg. IAS/Shunts: No atrial level shunt detected by color flow Doppler.  LEFT VENTRICLE PLAX 2D LVIDd:         4.40 cm     Diastology LVIDs:         3.20 cm     LV e' medial:    6.20 cm/s LV PW:         1.00 cm     LV E/e' medial:  14.2 LV IVS:        0.90 cm     LV e' lateral:   7.62 cm/s LVOT diam:     2.30 cm     LV E/e' lateral: 11.6 LV SV:         82 LV SV Index:  48 LVOT Area:     4.15 cm  LV Volumes (MOD) LV vol d, MOD A2C: 95.6 ml LV vol d, MOD A4C: 95.5 ml LV vol s, MOD A2C: 45.2 ml LV vol s, MOD A4C: 37.0 ml LV SV MOD A2C:     50.3 ml LV SV MOD A4C:     95.5 ml LV SV MOD BP:      54.9 ml RIGHT VENTRICLE             IVC RV S prime:     15.00 cm/s  IVC diam: 1.20 cm TAPSE (M-mode): 1.2 cm LEFT ATRIUM             Index        RIGHT ATRIUM           Index LA diam:        3.30 cm 1.95 cm/m   RA Area:     12.40 cm LA Vol (A2C):   39.5 ml 23.36 ml/m  RA Volume:   27.30 ml  16.14 ml/m LA Vol (A4C):   38.0 ml 22.47 ml/m LA Biplane Vol: 40.3 ml 23.83 ml/m  AORTIC VALVE LVOT Vmax:   111.00 cm/s LVOT Vmean:  70.400 cm/s LVOT VTI:    0.197 m  AORTA Ao Root diam: 3.50 cm Ao Asc diam:  2.80 cm MITRAL VALVE MV Area (PHT): 4.58 cm    SHUNTS MV Decel Time: 166 msec    Systemic VTI:  0.20 m MV E velocity: 88.25 cm/s  Systemic Diam: 2.30 cm MV A velocity: 76.55 cm/s MV E/A ratio:  1.15 Dorris Carnes MD Electronically signed by Dorris Carnes MD Signature Date/Time: 07/25/2022/11:45:28 AM    Final      LOS: 7 days    Oren Binet, MD  [facility]    To contact the attending provider between 7A-7P or the covering provider during after hours 7P-7A, please log into the web site [email or web address] and access using universal [place] password for that web site. If you do not have the password, please call the hospital operator.  07/26/2022, 10:29 AM

## 2022-07-27 DIAGNOSIS — R52 Pain, unspecified: Secondary | ICD-10-CM

## 2022-07-27 DIAGNOSIS — Z66 Do not resuscitate: Secondary | ICD-10-CM

## 2022-07-27 DIAGNOSIS — Z7189 Other specified counseling: Secondary | ICD-10-CM

## 2022-07-27 DIAGNOSIS — C7931 Secondary malignant neoplasm of brain: Secondary | ICD-10-CM | POA: Diagnosis not present

## 2022-07-27 DIAGNOSIS — Z515 Encounter for palliative care: Secondary | ICD-10-CM

## 2022-07-27 LAB — CBC
HCT: 30.5 % — ABNORMAL LOW (ref 36.0–46.0)
Hemoglobin: 9.9 g/dL — ABNORMAL LOW (ref 12.0–15.0)
MCH: 30.5 pg (ref 26.0–34.0)
MCHC: 32.5 g/dL (ref 30.0–36.0)
MCV: 93.8 fL (ref 80.0–100.0)
Platelets: 373 10*3/uL (ref 150–400)
RBC: 3.25 MIL/uL — ABNORMAL LOW (ref 3.87–5.11)
RDW: 17.9 % — ABNORMAL HIGH (ref 11.5–15.5)
WBC: 17.6 10*3/uL — ABNORMAL HIGH (ref 4.0–10.5)
nRBC: 0 % (ref 0.0–0.2)

## 2022-07-27 LAB — GLUCOSE, CAPILLARY
Glucose-Capillary: 149 mg/dL — ABNORMAL HIGH (ref 70–99)
Glucose-Capillary: 220 mg/dL — ABNORMAL HIGH (ref 70–99)
Glucose-Capillary: 263 mg/dL — ABNORMAL HIGH (ref 70–99)
Glucose-Capillary: 168 mg/dL — ABNORMAL HIGH (ref 70–99)
Glucose-Capillary: 280 mg/dL — ABNORMAL HIGH (ref 70–99)
Glucose-Capillary: 228 mg/dL — ABNORMAL HIGH (ref 70–99)

## 2022-07-27 LAB — BASIC METABOLIC PANEL
Anion gap: 10 (ref 5–15)
BUN: 13 mg/dL (ref 6–20)
CO2: 22 mmol/L (ref 22–32)
Calcium: 8.6 mg/dL — ABNORMAL LOW (ref 8.9–10.3)
Chloride: 104 mmol/L (ref 98–111)
Creatinine, Ser: 0.53 mg/dL (ref 0.44–1.00)
GFR, Estimated: >60 mL/min (ref >60)
Glucose, Bld: 142 mg/dL — ABNORMAL HIGH (ref 70–99)
Potassium: 5.1 mmol/L (ref 3.5–5.1)
Sodium: 136 mmol/L (ref 135–145)

## 2022-07-27 LAB — HEMOGLOBIN A1C
Hgb A1c MFr Bld: 5.3 % (ref 4.8–5.6)
Mean Plasma Glucose: 105 mg/dL

## 2022-07-27 LAB — PHOSPHORUS: Phosphorus: 2.7 mg/dL (ref 2.5–4.6)

## 2022-07-27 LAB — MAGNESIUM: Magnesium: 2.2 mg/dL (ref 1.7–2.4)

## 2022-07-27 MED ORDER — HYDROMORPHONE HCL 1 MG/ML PO LIQD
1.0000 mg | ORAL | Status: DC
  Filled 2022-07-27 (×4): qty 1

## 2022-07-27 MED ORDER — HYDROMORPHONE HCL 1 MG/ML IJ SOLN
3.0000 mg | INTRAMUSCULAR | Status: DC | PRN
  Administered 2022-07-27: 3 mg via INTRAVENOUS
  Filled 2022-07-27: qty 3

## 2022-07-27 MED ORDER — HYDROMORPHONE HCL 1 MG/ML PO LIQD
1.0000 mg | ORAL | Status: DC
  Administered 2022-07-27 – 2022-07-28 (×4): 1 mg
  Filled 2022-07-27 (×4): qty 1

## 2022-07-27 MED ORDER — HYDROMORPHONE HCL 1 MG/ML IJ SOLN
2.0000 mg | INTRAMUSCULAR | Status: AC | PRN
  Administered 2022-07-27 – 2022-07-28 (×3): 2 mg via INTRAVENOUS
  Filled 2022-07-27 (×3): qty 2

## 2022-07-27 MED ORDER — INSULIN ASPART 100 UNIT/ML IJ SOLN
0.0000 [IU] | INTRAMUSCULAR | Status: DC
  Administered 2022-07-27 (×2): 3 [IU] via SUBCUTANEOUS
  Administered 2022-07-28 (×2): 5 [IU] via SUBCUTANEOUS
  Administered 2022-07-28: 3 [IU] via SUBCUTANEOUS

## 2022-07-27 MED ORDER — HYDROMORPHONE HCL 1 MG/ML PO LIQD
5.0000 mg | ORAL | Status: DC
  Filled 2022-07-27 (×5): qty 5

## 2022-07-27 MED ORDER — HYDROMORPHONE HCL 1 MG/ML PO LIQD
5.0000 mg | ORAL | Status: DC
  Administered 2022-07-27 – 2022-07-28 (×4): 5 mg
  Filled 2022-07-27 (×3): qty 5

## 2022-07-27 MED ORDER — DEXAMETHASONE SODIUM PHOSPHATE 4 MG/ML IJ SOLN
4.0000 mg | Freq: Four times a day (QID) | INTRAMUSCULAR | Status: DC
  Administered 2022-07-27 – 2022-07-28 (×4): 4 mg via INTRAVENOUS
  Filled 2022-07-27 (×4): qty 1

## 2022-07-27 MED ORDER — HYDROMORPHONE HCL 1 MG/ML PO LIQD: 6.0000 mg | ORAL | Status: DC

## 2022-07-27 MED ORDER — LORAZEPAM 1 MG PO TABS
1.0000 mg | ORAL_TABLET | ORAL | Status: DC
  Administered 2022-07-27 – 2022-07-28 (×4): 1 mg via ORAL
  Filled 2022-07-27 (×4): qty 1

## 2022-07-27 MED ORDER — LORAZEPAM 1 MG PO TABS
2.0000 mg | ORAL_TABLET | Freq: Four times a day (QID) | ORAL | Status: DC
  Administered 2022-07-27: 2 mg via ORAL
  Filled 2022-07-27: qty 2

## 2022-07-27 NOTE — Progress Notes (Addendum)
This chaplain is present for spiritual care visit. Family is not at the bedside. The chaplain is appreciative of the RN-Teresa's update. The chaplain understands from the RN pain medicine was recently given and the Pt. is resting comfortably.  The chaplain will attempt a revisit later today.  *1320 This chaplain is present with the Pt. and Pt. husband-Theresa Pace. Theresa Pace accepts the chaplain's invitation to get to know the Pt. through his stories of family and the activities the Pt. and Theresa Pace enjoy together. Theresa Pace is appreciative of Palliative Care NP-Alicia's support and communication of choices for Pt. care. The chaplain understands the Pt. sons will visit this afternoon. The family will spend time together with the Pt., God's love, and prayer.  Chaplain Sallyanne Kuster [phone]

## 2022-07-27 NOTE — Progress Notes (Cosign Needed)
  Transition of Care [facility]) Screening Note   Patient Details  Name: Theresa Pace Date of Birth: [birth year]   Transition of Care [facility]) CM/SW Contact:    Dawayne Patricia, RN Phone Number: 07/27/2022, 3:20 PM    Transition of Care Department [facility]) has reviewed patient and note patient s/p T8-T9 decompressive laminectomy with resection of tumor with post op seizure like activity. Cortak placed w/ TF started. PC consulted and following for Goals of care. We will continue to monitor patient advancement through interdisciplinary progression rounds. If new patient transition needs arise, please place a TOC consult.

## 2022-07-27 NOTE — Progress Notes (Signed)
Nutrition Follow-up  DOCUMENTATION CODES:   Non-severe (moderate) malnutrition in context of chronic illness  INTERVENTION:   Continue tube feeds via Cortrak: - Increase Osmolite 1.2 to goal rate of 65 ml/hr (1560 ml/day)  Tube feeding regimen provides 1872 kcal, 87 grams of protein, and 1279 ml of H2O.   NUTRITION DIAGNOSIS:   Moderate Malnutrition related to chronic illness (metastatic melanoma) as evidenced by mild fat depletion, moderate fat depletion, moderate muscle depletion, severe muscle depletion.  Ongoing, being addressed via TF regimen  GOAL:   Patient will meet greater than or equal to 90% of their needs  Met via TF regimen  MONITOR:   PO intake, TF tolerance, Labs, Weight trends, Skin  REASON FOR ASSESSMENT:   Consult Enteral/tube feeding initiation and management  ASSESSMENT:   62 yo female admitted with AMS, worsening back pain and LE weakness with severe spinal stenosis and tumor extension in setting of known metastatic cancer. PMH includes metastatic melanoma currently undergoing immunotherapy, COPD, chronic pain syndrome, ADHD, HTN, HLD.  02/27 - CT abdomen/pelvis showing diffuse progressive metastatic disease 02/28 - s/p T8-T9 decompressive laminectomy with resection of tumor 03/04 - Cortrak placed (tip gastric), TF started  Palliative Medicine following regarding [place]. Per notes, family would like to see how pt does over the new few days prior to considering hospice options. Palliative NP to talk with family again later today per notes.  Cortrak remains in place with TF infusing. Pt remains on dysphagia 1 diet with thin liquids. SLP saw pt yesterday and recommend continuing current diet order with full supervision and assist with feeding to ensure safety. No meal completions charted over the last week. Suspect PO intake has remained poor.  Discussed pt with RN. Tube feeds infusing at 50 ml/hr at time of RD visit per titration orders. Discussed  proceeding with increasing tube feeds to goal rate 65 ml/hr. Orders adjusted to reflect this.  Pt with moderate pitting edema to BUE and mild pitting edema to BLE. Unsure of pt's actual dry weight.  Admit weight: 69.9 kg Current weight: 68.1 kg  Current TF: Osmolite 1.2 @ 50 ml/hr  Medications reviewed and include: IV decadron, colace, SSI q 4 hours, melatonin, protonix, miralax, senna IVF: NS @ 50 ml/hr  Labs reviewed. CBG's: 149-263 x 24 hours  UOP: 700 ml x 24 hours I/O's: +9.9 L since admit  Diet Order:   Diet Order             DIET - DYS 1 Room service appropriate? No; Fluid consistency: Thin  Diet effective now                   EDUCATION NEEDS:   Not appropriate for education at this time  Skin:  Skin Assessment: Reviewed RN Assessment (incision to back)  Last BM:  07/26/22 large type 5  Height:   Ht Readings from Last 1 Encounters:  07/20/22 '5\' 2"'$  (1.575 m)    Weight:   Wt Readings from Last 1 Encounters:  07/27/22 68.1 kg    BMI:  Body mass index is 27.46 kg/m.  Estimated Nutritional Needs:   Kcal:  1700-1900 kcals  Protein:  75-100 grams  Fluid:  >/= 1.7 L    Gustavus Bryant, MS, RD, LDN Inpatient Clinical Dietitian Please see AMiON for contact information.

## 2022-07-27 NOTE — Progress Notes (Signed)
Palliative:  HPI: 62 y.o. 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 Hospitalization complicated by new finding of brain metastasis with symptoms of confusion and seizures.      I met today at [facility] bedside but no family present. She continues to be extremely agitated and confirms pain. Unable to make her needs known. Continues with confusion and extreme discomfort and distress. She remains restrained.   I met with husband, Ovid Curd, at bedside. We reviewed her current status and she is resting better after receiving medication. She continues in distress often and requiring ongoing titration of medication to try and keep her calm and comfortable. Ovid Curd shares that he received a call to set up brain radiation. He reports confusion with different conversations. We reviewed the severity of her current status and cancer progression. Unfortunately I do not believe that radiation will be the answer to give her relief and quality of life. We discussed the process of radiation in the setting of already struggling with maintaining her comfort. We discussed that radiation is unlikely to provide an outcome that they desire. We agree to continue taking time for outcomes. We did discuss the option of dilaudid continuous infusion for better symptom control. We will reassess again tomorrow to consider path forward. Ovid Curd plans to speak more with their children.   All questions/concerns addressed. Emotional support provided. Discussed with Dr. Sloan Leiter.   Exam: Restless, agitated, pain - relieved with dilaudid + Ativan combination. Will answer some simple questions or respond to family inconsistently. Not following commands. Tachycardic. Breathing regular, unlabored. Abd soft.   Plan: - DNR - Scheduled dilaudid +  Ativan per tube with IV breakthrough.  - Ongoing conversation regarding path forward tomorrow.   [place], NP Palliative Medicine Team Pager [phone] (Please see [email or web address] for schedule) Team Phone [phone]    Greater than 50%  of this time was spent counseling and coordinating care related to the above assessment and plan

## 2022-07-27 NOTE — Progress Notes (Addendum)
PROGRESS NOTE        PATIENT DETAILS Name: Theresa Pace Age: 62 y.o. Sex: female Date of Birth: November 17, 1960 Admit Date: 07/13/2022 Admitting Physician Earnie Larsson, MD [id number], [facility]  Brief Summary: 62 year old with history of metastatic melanoma, COPD, chronic pain syndrome, ADHD-who is currently undergoing immunotherapy for metastatic melanoma-presented to the hospital with worsening back pain and lower extremity weakness-she evaluated with an MRI which showed pathological T8/T9 fractures and epidural extension of the tumor-she was taken to the OR on 2/28 for decompression by neurosurgery. TRH was consulted on 2/29 for evaluation of confusion-on 3/1-patient was found to have breakthrough seizures.  See below for further details.   Significant events: 2/27>> admit by neurosurgery 2/28>> T8-T9 decompressive laminectomy with resection of tumor 2/29>> consult to TRH-TRH assumed primary service. 3/01>> seizure-like activity-neurology consulted.  Significant studies: 2/27>> CT abdomen/pelvis: Diffuse progressive metastatic disease throughout lower chest/abdomen/pelvis. 2/27>> MRI T-spine: T8-T9 vertebral bodies compression fracture, extraosseous/epidural extension into the ventral epidural space at T9 with resultant severe spinal stenosis/cord compression. 2/27>> MRI L-spine: Multiple osseous mets involving lumbar spine, no extraosseous or epidural extension of tumor. 2/29>> CXR: Right mid lung alveolar process unchanged. 3/1-3/2>>LTM  EEG: No seizures. 3/02>> MRI brain: Numerous enhancing lesions in the supratentorial and infratentorial brain-consistent with metastatic disease. 3/04>> echo: EF 55-60%  Significant microbiology data: 2/27>> COVID/influenza/RSV PCR: Negative 2/29>> blood culture: No growth  Procedures: 2/28>> T8-T9 decompressive laminectomy with resection of  tumor  Consults: Neurology Neurosurgery  Subjective: Confused-restless at times-but relatively uneventful night.  Seems essentially the same compared to yesterday.  No family at bedside this morning.  Objective: Vitals: Blood pressure (!) 145/69, pulse (!) 117, temperature (!) 97.4 F (36.3 C), temperature source Oral, resp. rate 17, height '5\' 2"'$  (1.575 m), weight 68.1 kg, SpO2 98 %.   Exam: Gen Exam: Confused/restless but not in distress. HEENT:atraumatic, normocephalic Chest: B/L clear to auscultation anteriorly CVS:S1S2 regular Abdomen:soft non tender, non distended Extremities:no edema Neurology: Non focal Skin: no rash  Pertinent Labs/Radiology:    Latest Ref Rng & Units 07/26/2022    4:52 AM 07/25/2022    7:57 AM 07/24/2022    7:51 AM  CBC  WBC 4.0 - 10.5 K/uL 17.9  18.9  12.9   Hemoglobin 12.0 - 15.0 g/dL 10.3  8.9  9.5   Hematocrit 36.0 - 46.0 % 31.2  27.2  29.4   Platelets 150 - 400 K/uL 453  471  383     Lab Results  Component Value Date   NA 136 07/27/2022   K 5.1 07/27/2022   CL 104 07/27/2022   CO2 22 07/27/2022     Assessment/Plan: Metastatic melanoma with T8/T9 pathologic fracture  Cord compression at T8-T9 level Epidural tumor with myelopathy S/p T8-T9 decompressive laminectomy with resection of tumor on 2/28 Widespread metastatic melanoma with brain mets Postoperative care deferred to neurosurgery Remains on IV Decadron Radiation oncology consult placed 3/4 Appears to have a significant metastatic burden-poor overall health-Per spouse-had really poor appetite even prior to this hospitalization.  Palliative care engaging with family-now a DNR-family wants to give it a few days to see if she will improve before considering hospice measures.  Addendum: D/w Rad Onc team-Ellem Muir PA-C-who then d/w Dr Shona Simpson Rad Tx candidate given severe encephalopathy-FTT-Poor functional status. Recommendations are to increase Decadron to '4mg'$  q6 hrs, if no  improvement in 24 hours or so-then ok to transition to comfort care. I subsequently spoke with Oris Drone about above recommendations. Palliative team aware as well.  Acute toxic metabolic encephalopathy Breakthrough seizure Initially thought to have acute encephalopathy in the setting of postop status/narcotics/UTI-however had seizure-like activity on 3/1-and subsequently started on IV Keppra. Remains persistently encephalopathic-suspect multifactorial etiology in the setting of brain mets/postop status/pain/hospital delirium/breakthrough seizures  Remains on Keppra QTc remains prolonged-unable to use Haldol/Seroquel  Continue Depakote . Encephalopathy/restlessness improves with Dilaudid per nursing staff.  Ativan/Haldol has not worked over the past several days. Maintain strict aspiration precautions.  Hypokalemia/hypomagnesemia Replete/recheck  Prolonged QTc Keep K>4. Mg>2 Telemetry monitoring Avoid QTc prolonging agents  Normocytic anemia Likely due to acute illness/underlying malignancy Follow CBC  COPD Not in exacerbation Continue bronchodilators   Complicated UTI Completed course of IV Rocephin  ?  PAF/MAT Continues to have episodes of narrow complex tachycardia Continue IV beta-blocker Resume long-acting Cardizem Echo stable EF. Not safe for anticoagulation given recent thoracic spine surgery.    HLD Statin   Chronic pain syndrome IV Dilaudid for severe pain, Norco for moderate pain On chronic narcotics at home   HTN Relatively stable Now on IV metoprolol. Resume Cardizem  ADHD/fibromyalgia/depression Trazodone held for prolonged QTc Holding Adderall  Failure to thrive syndrome Palliative care Per spouse-had decreased oral intake given prior to this hospitalization Per nursing staff-no significant oral intake over the past several days due to severe encephalopathy S/p Cortrak tube placement on 3/4-continue feedings for now.  Now a DNR-poor overall  prognosis-family wanting to give it a few days before considering hospice care.  Nutrition Status: Nutrition Problem: Moderate Malnutrition Etiology: chronic illness (metastatic melanoma) Signs/Symptoms: mild fat depletion, moderate fat depletion, moderate muscle depletion, severe muscle depletion Interventions: Refer to RD note for recommendations, Tube feeding    BMI: Estimated body mass index is 27.46 kg/m as calculated from the following:   Height as of this encounter: '5\' 2"'$  (1.575 m).   Weight as of this encounter: 68.1 kg.   Code status:   Code Status: DNR   DVT Prophylaxis: SCD's Start: 07/20/22 2118 SCDs Start: 06/29/2022 2316    Family Communication: Spouse-[phone]-left VM 3/6   Disposition Plan: Status is: Inpatient Remains inpatient appropriate because: Severity of illness   Planned Discharge Destination:Skilled nursing facility   Diet: Diet Order             DIET - DYS 1 Room service appropriate? No; Fluid consistency: Thin  Diet effective now                     Antimicrobial agents: Anti-infectives (From admission, onward)    Start     Dose/Rate Route Frequency Ordered Stop   07/22/22 0915  cefTRIAXone (ROCEPHIN) 2 g in sodium chloride 0.9 % 100 mL IVPB        2 g 200 mL/hr over 30 Minutes Intravenous Every 24 hours 07/21/22 1016 07/24/22 1034   07/21/22 0915  cefTRIAXone (ROCEPHIN) 1 g in sodium chloride 0.9 % 100 mL IVPB  Status:  Discontinued        1 g 200 mL/hr over 30 Minutes Intravenous Every 24 hours 07/21/22 0821 07/21/22 1016   07/20/22 2215  ceFAZolin (ANCEF) IVPB 1 g/50 mL premix        1 g 100 mL/hr over 30 Minutes Intravenous Every 8 hours 07/20/22 2118 07/21/22 0610   07/20/22 1007  ceFAZolin (ANCEF) IVPB 2g/100 mL premix  2 g 200 mL/hr over 30 Minutes Intravenous 30 min pre-op 07/20/22 1007 07/20/22 1810        MEDICATIONS: Scheduled Meds:  arformoterol  15 mcg Nebulization BID   baclofen  10 mg Oral  TID   budesonide (PULMICORT) nebulizer solution  0.25 mg Nebulization BID   dexamethasone (DECADRON) injection  2 mg Intravenous Q8H   diltiazem  120 mg Oral Daily   divalproex  125 mg Oral Q12H   docusate sodium  100 mg Oral BID   insulin aspart  0-9 Units Subcutaneous Q4H   melatonin  5 mg Oral QHS   metoprolol tartrate  5 mg Intravenous Q6H   pantoprazole  40 mg Oral Daily   polyethylene glycol  17 g Oral Daily   revefenacin  175 mcg Nebulization Daily   rosuvastatin  20 mg Oral QPM   senna-docusate  2 tablet Oral QHS   sodium chloride flush  10-40 mL Intracatheter Q12H   umeclidinium bromide  1 puff Inhalation Daily   Continuous Infusions:  sodium chloride 50 mL/hr at 07/26/22 1804   feeding supplement (OSMOLITE 1.2 CAL) 1,000 mL (07/27/22 0616)   levETIRAcetam 500 mg (07/27/22 0933)   methocarbamol (ROBAXIN) IV Stopped (07/24/22 1630)   PRN Meds:.acetaminophen **OR** acetaminophen, albuterol, bisacodyl, HYDROcodone-acetaminophen, HYDROmorphone (DILAUDID) injection, LORazepam, menthol-cetylpyridinium **OR** phenol, methocarbamol (ROBAXIN) IV, metoprolol tartrate, naLOXone (NARCAN)  injection, ondansetron (ZOFRAN) IV **OR** ondansetron, sodium chloride flush, sodium phosphate   I have personally reviewed following labs and imaging studies  LABORATORY DATA: CBC: Recent Labs  Lab 07/21/22 1126 07/22/22 0652 07/23/22 0819 07/24/22 0751 07/25/22 0757 07/26/22 0452  WBC 15.1* 10.7* 11.9* 12.9* 18.9* 17.9*  NEUTROABS 13.3*  --   --   --   --   --   HGB 9.8* 9.3* 8.8* 9.5* 8.9* 10.3*  HCT 30.1* 28.9* 28.7* 29.4* 27.2* 31.2*  MCV 94.1 93.2 95.0 93.0 91.6 92.3  PLT 500* 465* 457* 383 471* 453*     Basic Metabolic Panel: Recent Labs  Lab 07/22/22 0652 07/23/22 0819 07/24/22 0751 07/25/22 0541 07/25/22 0757 07/26/22 0452 07/27/22 0449  NA 139 143 141  --  135 134* 136  K 4.0 3.6 3.8  --  3.2* 5.0 5.1  CL 105 113* 113*  --  104 106 104  CO2 21* 19* 17*  --  21* 16*  22  GLUCOSE 153* 123* 104*  --  154* 190* 142*  BUN 34* 27* 16  --  '11 10 13  '$ CREATININE 0.85 0.68 0.60  --  0.45 0.74 0.53  CALCIUM 7.3* 7.5* 7.6*  --  7.7* 7.8* 8.6*  MG 2.3  --   --  1.9 1.7 2.2 2.2  PHOS  --   --   --   --  1.6* 2.6 2.7     GFR: Estimated Creatinine Clearance: 67.6 mL/min (by C-G formula based on SCr of 0.53 mg/dL).  Liver Function Tests: Recent Labs  Lab 07/21/22 1126 07/23/22 0819 07/25/22 0757  AST '20 20 24  '$ ALT '12 12 16  '$ ALKPHOS 77 70 72  BILITOT 0.4 0.6 0.6  PROT 5.2* 4.9* 5.3*  ALBUMIN 1.8* 2.0* 2.2*    No results for input(s): "LIPASE", "AMYLASE" in the last 168 hours. Recent Labs  Lab 07/22/22 0652  AMMONIA 27     Coagulation Profile: No results for input(s): "INR", "PROTIME" in the last 168 hours.  Cardiac Enzymes: No results for input(s): "CKTOTAL", "CKMB", "CKMBINDEX", "TROPONINI" in the last 168 hours.  BNP (  last 3 results) No results for input(s): "PROBNP" in the last 8760 hours.  Lipid Profile: No results for input(s): "CHOL", "HDL", "LDLCALC", "TRIG", "CHOLHDL", "LDLDIRECT" in the last 72 hours.  Thyroid Function Tests: No results for input(s): "TSH", "T4TOTAL", "FREET4", "T3FREE", "THYROIDAB" in the last 72 hours.  Anemia Panel: No results for input(s): "VITAMINB12", "FOLATE", "FERRITIN", "TIBC", "IRON", "RETICCTPCT" in the last 72 hours.   Urine analysis:    Component Value Date/Time   COLORURINE YELLOW 07/21/2022 1311   APPEARANCEUR CLEAR 07/21/2022 1311   LABSPEC >1.030 (H) 07/21/2022 1311   PHURINE 5.5 07/21/2022 1311   GLUCOSEU NEGATIVE 07/21/2022 1311   HGBUR NEGATIVE 07/21/2022 1311   BILIRUBINUR NEGATIVE 07/21/2022 1311   KETONESUR 15 (A) 07/21/2022 1311   PROTEINUR 30 (A) 07/21/2022 1311   UROBILINOGEN 0.2 02/21/2012 0055   NITRITE NEGATIVE 07/21/2022 1311   LEUKOCYTESUR NEGATIVE 07/21/2022 1311    Sepsis Labs: Lactic Acid, Venous    Component Value Date/Time   LATICACIDVEN 1.5 07/21/2022 1416     MICROBIOLOGY: Recent Results (from the past 240 hour(s))  Resp panel by RT-PCR (RSV, Flu A&B, Covid) Anterior Nasal Swab     Status: None   Collection Time: 07/03/2022  5:15 PM   Specimen: Anterior Nasal Swab  Result Value Ref Range Status   SARS Coronavirus 2 by RT PCR NEGATIVE NEGATIVE Final    Comment: (NOTE) SARS-CoV-2 target nucleic acids are NOT DETECTED.  The SARS-CoV-2 RNA is generally detectable in upper respiratory specimens during the acute phase of infection. The lowest concentration of SARS-CoV-2 viral copies this assay can detect is 138 copies/mL. A negative result does not preclude SARS-Cov-2 infection and should not be used as the sole basis for treatment or other patient management decisions. A negative result may occur with  improper specimen collection/handling, submission of specimen other than nasopharyngeal swab, presence of viral mutation(s) within the areas targeted by this assay, and inadequate number of viral copies(<138 copies/mL). A negative result must be combined with clinical observations, patient history, and epidemiological information. The expected result is Negative.  Fact Sheet for Patients:  EntrepreneurPulse.com.au  Fact Sheet for Healthcare Providers:  IncredibleEmployment.be  This test is no t yet approved or cleared by the [place] FDA and  has been authorized for detection and/or diagnosis of SARS-CoV-2 by FDA under an Emergency Use Authorization (EUA). This EUA will remain  in effect (meaning this test can be used) for the duration of the COVID-19 declaration under Section 564(b)(1) of the Act, 21 U.S.C.section 360bbb-3(b)(1), unless the authorization is terminated  or revoked sooner.       Influenza A by PCR NEGATIVE NEGATIVE Final   Influenza B by PCR NEGATIVE NEGATIVE Final    Comment: (NOTE) The Xpert Xpress SARS-CoV-2/FLU/RSV plus assay is intended as an aid in the diagnosis of  influenza from Nasopharyngeal swab specimens and should not be used as a sole basis for treatment. Nasal washings and aspirates are unacceptable for Xpert Xpress SARS-CoV-2/FLU/RSV testing.  Fact Sheet for Patients: EntrepreneurPulse.com.au  Fact Sheet for Healthcare Providers: IncredibleEmployment.be  This test is not yet approved or cleared by the [place] FDA and has been authorized for detection and/or diagnosis of SARS-CoV-2 by FDA under an Emergency Use Authorization (EUA). This EUA will remain in effect (meaning this test can be used) for the duration of the COVID-19 declaration under Section 564(b)(1) of the Act, 21 U.S.C. section 360bbb-3(b)(1), unless the authorization is terminated or revoked.     Resp Syncytial Virus by  PCR NEGATIVE NEGATIVE Final    Comment: (NOTE) Fact Sheet for Patients: EntrepreneurPulse.com.au  Fact Sheet for Healthcare Providers: IncredibleEmployment.be  This test is not yet approved or cleared by the [place] FDA and has been authorized for detection and/or diagnosis of SARS-CoV-2 by FDA under an Emergency Use Authorization (EUA). This EUA will remain in effect (meaning this test can be used) for the duration of the COVID-19 declaration under Section 564(b)(1) of the Act, 21 U.S.C. section 360bbb-3(b)(1), unless the authorization is terminated or revoked.  Performed at [facility], [address], [place], [place] [address]   Surgical pcr screen     Status: None   Collection Time: 07/20/22  4:59 PM   Specimen: Nasal Mucosa; Nasal Swab  Result Value Ref Range Status   MRSA, PCR NEGATIVE NEGATIVE Final   Staphylococcus aureus NEGATIVE NEGATIVE Final    Comment: (NOTE) The Xpert SA Assay (FDA approved for NASAL specimens in patients 94 years of age and older), is one component of a comprehensive surveillance program. It is not intended to diagnose infection  nor to guide or monitor treatment. Performed at [facility], [place] [address], [place], [place] [address]   Culture, blood (Routine X 2) w Reflex to ID Panel     Status: None   Collection Time: 07/21/22 11:26 AM   Specimen: BLOOD LEFT ARM  Result Value Ref Range Status   Specimen Description BLOOD LEFT ARM  Final   Special Requests   Final    BOTTLES DRAWN AEROBIC AND ANAEROBIC Blood Culture adequate volume   Culture   Final    NO GROWTH 5 DAYS Performed at [facility], [place] [address] [place], [place] [address]    Report Status 07/26/2022 FINAL  Final  Culture, blood (Routine X 2) w Reflex to ID Panel     Status: None   Collection Time: 07/21/22 11:38 AM   Specimen: BLOOD LEFT HAND  Result Value Ref Range Status   Specimen Description BLOOD LEFT HAND  Final   Special Requests   Final    BOTTLES DRAWN AEROBIC AND ANAEROBIC Blood Culture adequate volume   Culture   Final    NO GROWTH 5 DAYS Performed at [facility], [place] [address], [place], [place] [address]    Report Status 07/26/2022 FINAL  Final  Urine Culture     Status: None   Collection Time: 07/21/22  1:11 PM   Specimen: Urine, Random  Result Value Ref Range Status   Specimen Description URINE, RANDOM  Final   Special Requests NONE Reflexed from [id number]  Final   Culture   Final    NO GROWTH Performed at [facility], [place] [address], [place], [place] [address]    Report Status 07/23/2022 FINAL  Final    RADIOLOGY STUDIES/RESULTS: DG Abd Portable 1V  Result Date: 07/25/2022 CLINICAL DATA:  [id number] Encounter for feeding tube placement [id number] EXAM: PORTABLE ABDOMEN - 1 VIEW COMPARISON:  07/05/2022 FINDINGS: Limited radiograph of the lower chest and upper abdomen was obtained for the purposes of enteric tube localization. Enteric tube is seen coursing below the diaphragm with distal tip and terminating within the expected location of the gastric body. IMPRESSION: Enteric tube tip projects within the  expected location of the gastric body. Electronically Signed   By: Davina Poke D.O.   On: 07/25/2022 12:43     LOS: 8 days   Oren Binet, MD  [facility]  To contact the attending provider between 7A-7P or the covering provider during after hours 7P-7A, please log into the web site [email or web address] and access using universal [place] password for that web site. If you do not have the password, please call the hospital operator.  07/27/2022, 10:25 AM

## 2022-07-27 NOTE — Progress Notes (Signed)
   Providing Compassionate, Quality Care - Together   Subjective: Patient alert, but minimally interactive. She is moaning and appears uncomfortable presently. Nurse is at bedside administering pain medication.  Objective: Vital signs in last 24 hours: Temp:  [97.4 F (36.3 C)-98.3 F (36.8 C)] 97.4 F (36.3 C) (03/06 0754) Pulse Rate:  [65-131] 80 (03/06 1000) Resp:  [8-27] 18 (03/06 1000) BP: (130-158)/(58-114) 145/69 (03/06 0754) SpO2:  [97 %-100 %] 97 % (03/06 1000) Weight:  [68.1 kg] 68.1 kg (03/06 0500)  Intake/Output from previous day: 03/05 0701 - 03/06 0700 In: 1871.3 [I.V.:1183.3; NG/GT:588; IV Piggyback:100] Out: 700 [Urine:700] Intake/Output this shift: Total I/O In: 587.5 [I.V.:187.5; NG/GT:200; IV Piggyback:200] Out: -   Alert, but not interactive PERRLA, sluggish Unable to follow commands presently MAE, appears purposeful  Lab Results: Recent Labs    07/25/22 0757 07/26/22 0452  WBC 18.9* 17.9*  HGB 8.9* 10.3*  HCT 27.2* 31.2*  PLT 471* 453*   BMET Recent Labs    07/26/22 0452 07/27/22 0449  NA 134* 136  K 5.0 5.1  CL 106 104  CO2 16* 22  GLUCOSE 190* 142*  BUN 10 13  CREATININE 0.74 0.53  CALCIUM 7.8* 8.6*    Studies/Results: DG Abd Portable 1V  Result Date: 07/25/2022 CLINICAL DATA:  [id number] Encounter for feeding tube placement [id number] EXAM: PORTABLE ABDOMEN - 1 VIEW COMPARISON:  07/19/2022 FINDINGS: Limited radiograph of the lower chest and upper abdomen was obtained for the purposes of enteric tube localization. Enteric tube is seen coursing below the diaphragm with distal tip and terminating within the expected location of the gastric body. IMPRESSION: Enteric tube tip projects within the expected location of the gastric body. Electronically Signed   By: Davina Poke D.O.   On: 07/25/2022 12:43    Assessment/Plan: Patient underwent a decompressive laminectomy at T8 and T9 due to pathologic fracture and debulking of epidural tumor by  Dr. Annette Stable on 07/20/2022. Unfortunately, she had a seizure on 07/22/2022 and an MRI revealed multiple brain metastases. Palliative care has been consulted and the family has discussed goals of care. The patient has been made a DNR. Discussed patient plan with Palliate Care provider, Vinie Sill, NP, at the bedside. She is going to discuss comfort measures further with the patient's husband later today. Continue supportive efforts      LOS: 8 days     Viona Gilmore, DNP, AGNP-C Nurse Practitioner  [facility] [place] [address], [address], [place], [place] [address] P: [phone]    F: [phone]  07/27/2022, 11:27 AM

## 2022-07-27 NOTE — Progress Notes (Signed)
Physical Therapy Treatment Patient Details Name: Theresa Pace MRN: [id number] DOB: 12/06/1960 Today's Date: 07/27/2022   History of Present Illness Theresa Pace is a 62 year old female with a two week history of worsening back pain, with weakness of bilateral lower extremities.   Found to have multiple osseous metastases involving the thoracic (including pathologic comp fx of T8&T9), and lumbar spine. She has associated extraosseous and epidural extension of tumor into the ventral epidural space at the level of T9 with resultant severe spinal stenosis and cord compression. She was transferred to [facility] and underwent T8-T9 decompressive laminectomy with resection of epidural tumor, microdissection on 2/287/24.  Post surgery pt with AMS, EEG negative as well as MRI of head for acute event. PHMx:with a past medical history for metastatic melanoma, tachycardia, seizures, COPD, left bundle branch block, tachycardia, fibromyalgia, GERD, hypercholesterolemia, and IBS.    PT Comments    Patient seen with focus on mobility for attention/arousal/and hopeful for palliation with some mobility, however patient does not tolerate well and noted increased pain behaviors after session with RN made aware.  Noted family continuing discussions with palliative care.  Will continue skilled PT in acute setting unless family decides on more comfort-based approach.    Recommendations for follow up therapy are one component of a multi-disciplinary discharge planning process, led by the attending physician.  Recommendations may be updated based on patient status, additional functional criteria and insurance authorization.  Follow Up Recommendations  Skilled nursing-short term rehab (<3 hours/day) Can patient physically be transported by private vehicle: No   Assistance Recommended at Discharge Frequent or constant Supervision/Assistance  Patient can return home with the following Two people to help with walking and/or  transfers;Two people to help with bathing/dressing/bathroom;Help with stairs or ramp for entrance;Assist for transportation;Direct supervision/assist for medications management;Assistance with cooking/housework;Assistance with feeding   Equipment Recommendations  Hospital bed;Wheelchair (measurements PT);Wheelchair cushion (measurements PT)    Recommendations for Other Services       Precautions / Restrictions Precautions Precautions: Fall Precaution Comments: hand mitts and wrist restraints, new coretrack     Mobility  Bed Mobility Overal bed mobility: Needs Assistance Bed Mobility: Rolling, Sidelying to Sit, Sit to Sidelying Rolling: Total assist, +2 for physical assistance Sidelying to sit: Total assist, +2 for physical assistance     Sit to sidelying: +2 for physical assistance, Total assist General bed mobility comments: assist for all aspects    Transfers                        Ambulation/Gait                   Stairs             Wheelchair Mobility    Modified Rankin (Stroke Patients Only)       Balance Overall balance assessment: Needs assistance Sitting-balance support: Feet unsupported Sitting balance-Leahy Scale: Zero Sitting balance - Comments: varied from mod to max A                                    Cognition Arousal/Alertness: Awake/alert Behavior During Therapy: Restless Overall Cognitive Status: Impaired/Different from baseline Area of Impairment: Attention, Following commands, Safety/judgement, Awareness, Problem solving                   Current Attention Level: Focused   Following Commands:  (not  following commands) Safety/Judgement: Decreased awareness of safety, Decreased awareness of deficits     General Comments: no verbalizations this session, noted pt tense and distressed in sitting, RN reports has been less responsive for her and has increased medications for comfort.   Family/palliative meetings ongoing.        Exercises      General Comments General comments (skin integrity, edema, etc.): no family in the room today; HR 126 with mobility and RN aware pt needs pain meds as pt pulling on wrist restraints and lifting shoulders off bed.      Pertinent Vitals/Pain Pain Assessment Pain Assessment: PAINAD Breathing: occasional labored breathing, short period of hyperventilation Negative Vocalization: occasional moan/groan, low speech, negative/disapproving quality Facial Expression: sad, frightened, frown Body Language: tense, distressed pacing, fidgeting Consolability: unable to console, distract or reassure PAINAD Score: 6 Pain Location: generalized Pain Descriptors / Indicators: Grimacing, Guarding Pain Intervention(s): Monitored during session, Patient requesting pain meds-RN notified, Limited activity within patient's tolerance    Home Living                          Prior Function            PT Goals (current goals can now be found in the care plan section) Progress towards PT goals: Not progressing toward goals - comment    Frequency    Min 2X/week      PT Plan Frequency needs to be updated    Co-evaluation              AM-PAC PT "6 Clicks" Mobility   Outcome Measure  Help needed turning from your back to your side while in a flat bed without using bedrails?: Total Help needed moving from lying on your back to sitting on the side of a flat bed without using bedrails?: Total Help needed moving to and from a bed to a chair (including a wheelchair)?: Total Help needed standing up from a chair using your arms (e.g., wheelchair or bedside chair)?: Total Help needed to walk in hospital room?: Total Help needed climbing 3-5 steps with a railing? : Total 6 Click Score: 6    End of Session   Activity Tolerance: Patient limited by pain Patient left: in bed;with bed alarm set;with restraints reapplied Nurse  Communication: Patient requests pain meds PT Visit Diagnosis: Other symptoms and signs involving the nervous system (R29.898);Other abnormalities of gait and mobility (R26.89);Muscle weakness (generalized) (M62.81)     Time: [id number] PT Time Calculation (min) (ACUTE ONLY): 25 min  Charges:  $Therapeutic Activity: 23-37 mins                     Magda Kiel, PT Acute Rehabilitation Services Office:[phone] 07/27/2022    Theresa Pace 07/27/2022, 5:04 PM

## 2022-07-28 ENCOUNTER — Telehealth: Payer: Self-pay | Admitting: Radiology

## 2022-07-28 DIAGNOSIS — Z7189 Other specified counseling: Secondary | ICD-10-CM | POA: Diagnosis not present

## 2022-07-28 DIAGNOSIS — C7931 Secondary malignant neoplasm of brain: Secondary | ICD-10-CM | POA: Diagnosis not present

## 2022-07-28 DIAGNOSIS — R52 Pain, unspecified: Secondary | ICD-10-CM | POA: Diagnosis not present

## 2022-07-28 DIAGNOSIS — Z66 Do not resuscitate: Secondary | ICD-10-CM | POA: Diagnosis not present

## 2022-07-28 DIAGNOSIS — Z515 Encounter for palliative care: Secondary | ICD-10-CM | POA: Diagnosis not present

## 2022-07-28 LAB — CBC
HCT: 30 % — ABNORMAL LOW (ref 36.0–46.0)
Hemoglobin: 9.3 g/dL — ABNORMAL LOW (ref 12.0–15.0)
MCH: 29.9 pg (ref 26.0–34.0)
MCHC: 31 g/dL (ref 30.0–36.0)
MCV: 96.5 fL (ref 80.0–100.0)
Platelets: 299 10*3/uL (ref 150–400)
RBC: 3.11 MIL/uL — ABNORMAL LOW (ref 3.87–5.11)
RDW: 18.1 % — ABNORMAL HIGH (ref 11.5–15.5)
WBC: 16 10*3/uL — ABNORMAL HIGH (ref 4.0–10.5)
nRBC: 0 % (ref 0.0–0.2)

## 2022-07-28 LAB — BASIC METABOLIC PANEL
Anion gap: 12 (ref 5–15)
BUN: 17 mg/dL (ref 6–20)
CO2: 19 mmol/L — ABNORMAL LOW (ref 22–32)
Calcium: 8.1 mg/dL — ABNORMAL LOW (ref 8.9–10.3)
Chloride: 105 mmol/L (ref 98–111)
Creatinine, Ser: 0.58 mg/dL (ref 0.44–1.00)
GFR, Estimated: >60 mL/min (ref >60)
Glucose, Bld: 170 mg/dL — ABNORMAL HIGH (ref 70–99)
Potassium: 5.4 mmol/L — ABNORMAL HIGH (ref 3.5–5.1)
Sodium: 136 mmol/L (ref 135–145)

## 2022-07-28 LAB — MAGNESIUM: Magnesium: 2.2 mg/dL (ref 1.7–2.4)

## 2022-07-28 LAB — GLUCOSE, CAPILLARY
Glucose-Capillary: 203 mg/dL — ABNORMAL HIGH (ref 70–99)
Glucose-Capillary: 188 mg/dL — ABNORMAL HIGH (ref 70–99)

## 2022-07-28 MED ORDER — GLYCOPYRROLATE 0.2 MG/ML IJ SOLN
0.2000 mg | INTRAMUSCULAR | Status: DC | PRN
  Administered 2022-07-29: 0.2 mg via INTRAVENOUS

## 2022-07-28 MED ORDER — GLYCOPYRROLATE 1 MG PO TABS: 1.0000 mg | ORAL_TABLET | ORAL | Status: DC | PRN

## 2022-07-28 MED ORDER — BIOTENE DRY MOUTH MT LIQD: 15.0000 mL | OROMUCOSAL | Status: DC | PRN

## 2022-07-28 MED ORDER — GLYCOPYRROLATE 0.2 MG/ML IJ SOLN
0.2000 mg | INTRAMUSCULAR | Status: DC | PRN
  Filled 2022-07-28: qty 1

## 2022-07-28 MED ORDER — LORAZEPAM 2 MG/ML IJ SOLN: 2.0000 mg | INTRAMUSCULAR | Status: DC | PRN

## 2022-07-28 MED ORDER — POLYVINYL ALCOHOL 1.4 % OP SOLN: 1.0000 [drp] | Freq: Four times a day (QID) | OPHTHALMIC | Status: DC | PRN

## 2022-07-28 MED ORDER — HYDROMORPHONE BOLUS VIA INFUSION
2.0000 mg | INTRAVENOUS | Status: DC | PRN
  Administered 2022-07-29 (×2): 2 mg via INTRAVENOUS

## 2022-07-28 MED ORDER — SODIUM ZIRCONIUM CYCLOSILICATE 5 G PO PACK
5.0000 g | PACK | Freq: Every day | ORAL | Status: DC
  Filled 2022-07-28: qty 1

## 2022-07-28 MED ORDER — HYDROMORPHONE HCL-NACL 50-0.9 MG/50ML-% IV SOLN
2.0000 mg/h | INTRAVENOUS | Status: DC
  Administered 2022-07-28 – 2022-07-29 (×2): 2 mg/h via INTRAVENOUS
  Administered 2022-07-29 – 2022-07-30 (×2): 3 mg/h via INTRAVENOUS
  Filled 2022-07-28 (×4): qty 50

## 2022-07-28 NOTE — Progress Notes (Signed)
PROGRESS NOTE        PATIENT DETAILS Name: Theresa Pace Age: 62 y.o. Sex: female Date of Birth: 1960-09-11 Admit Date: 07/11/2022 Admitting Physician Earnie Larsson, MD [id number], [facility]  Brief Summary: 62 year old with history of metastatic melanoma, COPD, chronic pain syndrome, ADHD-who is currently undergoing immunotherapy for metastatic melanoma-presented to the hospital with worsening back pain and lower extremity weakness-she evaluated with an MRI which showed pathological T8/T9 fractures and epidural extension of the tumor-she was taken to the OR on 2/28 for decompression by neurosurgery. TRH was consulted on 2/29 for evaluation of confusion-on 3/1-patient was found to have breakthrough seizures.  See below for further details.   Significant events: 2/27>> admit by neurosurgery 2/28>> T8-T9 decompressive laminectomy with resection of tumor 2/29>> consult to TRH-TRH assumed primary service. 3/01>> seizure-like activity-neurology consulted. 3/06>> discussed with radiation oncology-increase Decadron-if no improvement-okay for comfort care. 3/07>> transitioned comfort care  Significant studies: 2/27>> CT abdomen/pelvis: Diffuse progressive metastatic disease throughout lower chest/abdomen/pelvis. 2/27>> MRI T-spine: T8-T9 vertebral bodies compression fracture, extraosseous/epidural extension into the ventral epidural space at T9 with resultant severe spinal stenosis/cord compression. 2/27>> MRI L-spine: Multiple osseous mets involving lumbar spine, no extraosseous or epidural extension of tumor. 2/29>> CXR: Right mid lung alveolar process unchanged. 3/1-3/2>>LTM  EEG: No seizures. 3/02>> MRI brain: Numerous enhancing lesions in the supratentorial and infratentorial brain-consistent with metastatic disease. 3/04>> echo: EF 55-60%  Significant microbiology data: 2/27>> COVID/influenza/RSV PCR: Negative 2/29>> blood culture: No  growth  Procedures: 2/28>> T8-T9 decompressive laminectomy with resection of tumor  Consults: Neurology Neurosurgery  Subjective: Encephalopathic-restless at times-restrained.  No improvement-with steroids.  Objective: Vitals: Blood pressure (!) 98/58, pulse 60, temperature 97.6 F (36.4 C), temperature source Oral, resp. rate 16, height '5\' 2"'$  (1.575 m), weight 68.1 kg, SpO2 98 %.   Exam: Gen Exam: Encephalopathic/confused-restless but-not in any distress HEENT:atraumatic, normocephalic Chest: B/L clear to auscultation anteriorly CVS:S1S2 regular Abdomen:soft non tender, non distended Extremities:no edema Neurology: Good exam but seems to be moving all 4 extremities Skin: no rash  Pertinent Labs/Radiology:    Latest Ref Rng & Units 07/28/2022    4:41 AM 07/27/2022   11:11 AM 07/26/2022    4:52 AM  CBC  WBC 4.0 - 10.5 K/uL 16.0  17.6  17.9   Hemoglobin 12.0 - 15.0 g/dL 9.3  9.9  10.3   Hematocrit 36.0 - 46.0 % 30.0  30.5  31.2   Platelets 150 - 400 K/uL 299  373  453     Lab Results  Component Value Date   NA 136 07/28/2022   K 5.4 (H) 07/28/2022   CL 105 07/28/2022   CO2 19 (L) 07/28/2022     Assessment/Plan: Metastatic melanoma with T8/T9 pathologic fracture  Cord compression at T8-T9 level Epidural tumor with myelopathy S/p T8-T9 decompressive laminectomy with resection of tumor on 2/28 Widespread metastatic melanoma with brain mets Postoperative care was managed by neurosurgery Unfortunately appears to have a significant metastatic burden-remained persistently encephalopathic with severe failure to thrive syndrome postoperatively-no response to numerous changes in medications.  No response to high-dose Decadron as recommended by radiation oncology. After extensive discussion with family-has been transitioned to full comfort measures on 3/7.  Acute toxic metabolic encephalopathy Breakthrough seizure Initially thought to have acute encephalopathy in the setting  of postop status/narcotics/UTI-however had seizure-like activity on 3/1-and subsequently started on  IV Keppra.  LTM EEG was negative for seizures-once MRI brain showed multiple mets-neurology recommended palliative care/hospice care. Due to prolonged QTc-unable to use Haldol/Seroquel Per nursing staff-restlessness/encephalopathy at times responds to narcotics.  After extensive discussion with family-has been transitioned to full comfort measures on 3/7.  Hypokalemia/hypomagnesemia Repleted.  Prolonged QTc Plan was to keep keep K>4. Mg>2 Avoid QTc prolonging agents  Normocytic anemia Likely due to acute illness/underlying malignancy  COPD Not in exacerbation Continue bronchodilators   Complicated UTI Completed course of IV Rocephin  ?  PAF/MAT Did have few episodes of narrow complex tachycardia Echo stable EF. Not safe for anticoagulation given recent thoracic spine surgery.    HLD Statin   Chronic pain syndrome IV Dilaudid for severe pain, Norco for moderate pain On chronic narcotics at home   HTN Relatively stable Was on Cardizem/IV metoprolol  ADHD/fibromyalgia/depression Trazodone held for prolonged QTc Holding Adderall  Failure to thrive syndrome Palliative care Per spouse-had decreased oral intake given prior to this hospitalization Per nursing staff-no significant oral intake over the past several days due to severe encephalopathy S/p Cortrak tube placement on 3/4-continue feedings for now.  DNR in place Very poor overall prognosis-at failure to thrive syndrome even prior to this hospitalization but unfortunately postoperatively developed severe encephalopathy-after extensive discussion with family by this MD and the palliative care team-has been transitioned to full comfort measures.  Nutrition Status: Nutrition Problem: Moderate Malnutrition Etiology: chronic illness (metastatic melanoma) Signs/Symptoms: mild fat depletion, moderate fat depletion, moderate  muscle depletion, severe muscle depletion Interventions: Tube feeding    BMI: Estimated body mass index is 27.46 kg/m as calculated from the following:   Height as of this encounter: '5\' 2"'$  (1.575 m).   Weight as of this encounter: 68.1 kg.   Code status:   Code Status: DNR   DVT Prophylaxis:    Family Communication: Spouse-[phone]-updated over the phone on 3/7.   Disposition Plan: Status is: Inpatient Remains inpatient appropriate because: Severity of illness   Planned Discharge Destination:Skilled nursing facility   Diet: Diet Order             DIET - DYS 1 Room service appropriate? No; Fluid consistency: Thin  Diet effective now                     Antimicrobial agents: Anti-infectives (From admission, onward)    Start     Dose/Rate Route Frequency Ordered Stop   07/22/22 0915  cefTRIAXone (ROCEPHIN) 2 g in sodium chloride 0.9 % 100 mL IVPB        2 g 200 mL/hr over 30 Minutes Intravenous Every 24 hours 07/21/22 1016 07/24/22 1034   07/21/22 0915  cefTRIAXone (ROCEPHIN) 1 g in sodium chloride 0.9 % 100 mL IVPB  Status:  Discontinued        1 g 200 mL/hr over 30 Minutes Intravenous Every 24 hours 07/21/22 0821 07/21/22 1016   07/20/22 2215  ceFAZolin (ANCEF) IVPB 1 g/50 mL premix        1 g 100 mL/hr over 30 Minutes Intravenous Every 8 hours 07/20/22 2118 07/21/22 0610   07/20/22 1007  ceFAZolin (ANCEF) IVPB 2g/100 mL premix        2 g 200 mL/hr over 30 Minutes Intravenous 30 min pre-op 07/20/22 1007 07/20/22 1810        MEDICATIONS: Scheduled Meds:  sodium chloride flush  10-40 mL Intracatheter Q12H   Continuous Infusions:  sodium chloride 50 mL/hr at 07/27/22 1523  HYDROmorphone     levETIRAcetam 500 mg (07/28/22 0844)   methocarbamol (ROBAXIN) IV Stopped (07/24/22 1630)   PRN Meds:.acetaminophen **OR** acetaminophen, albuterol, antiseptic oral rinse, bisacodyl, glycopyrrolate **OR** glycopyrrolate **OR** glycopyrrolate,  HYDROmorphone, HYDROmorphone (DILAUDID) injection, LORazepam, methocarbamol (ROBAXIN) IV, metoprolol tartrate, ondansetron (ZOFRAN) IV **OR** ondansetron, polyvinyl alcohol, sodium chloride flush   I have personally reviewed following labs and imaging studies  LABORATORY DATA: CBC: Recent Labs  Lab 07/21/22 1126 07/22/22 0652 07/24/22 0751 07/25/22 0757 07/26/22 0452 07/27/22 1111 07/28/22 0441  WBC 15.1*   < > 12.9* 18.9* 17.9* 17.6* 16.0*  NEUTROABS 13.3*  --   --   --   --   --   --   HGB 9.8*   < > 9.5* 8.9* 10.3* 9.9* 9.3*  HCT 30.1*   < > 29.4* 27.2* 31.2* 30.5* 30.0*  MCV 94.1   < > 93.0 91.6 92.3 93.8 96.5  PLT 500*   < > 383 471* 453* 373 299   < > = values in this interval not displayed.     Basic Metabolic Panel: Recent Labs  Lab 07/24/22 0751 07/25/22 0541 07/25/22 0757 07/26/22 0452 07/27/22 0449 07/28/22 0441  NA 141  --  135 134* 136 136  K 3.8  --  3.2* 5.0 5.1 5.4*  CL 113*  --  104 106 104 105  CO2 17*  --  21* 16* 22 19*  GLUCOSE 104*  --  154* 190* 142* 170*  BUN 16  --  '11 10 13 17  '$ CREATININE 0.60  --  0.45 0.74 0.53 0.58  CALCIUM 7.6*  --  7.7* 7.8* 8.6* 8.1*  MG  --  1.9 1.7 2.2 2.2 2.2  PHOS  --   --  1.6* 2.6 2.7  --      GFR: Estimated Creatinine Clearance: 67.6 mL/min (by C-G formula based on SCr of 0.58 mg/dL).  Liver Function Tests: Recent Labs  Lab 07/21/22 1126 07/23/22 0819 07/25/22 0757  AST '20 20 24  '$ ALT '12 12 16  '$ ALKPHOS 77 70 72  BILITOT 0.4 0.6 0.6  PROT 5.2* 4.9* 5.3*  ALBUMIN 1.8* 2.0* 2.2*    No results for input(s): "LIPASE", "AMYLASE" in the last 168 hours. Recent Labs  Lab 07/22/22 0652  AMMONIA 27     Coagulation Profile: No results for input(s): "INR", "PROTIME" in the last 168 hours.  Cardiac Enzymes: No results for input(s): "CKTOTAL", "CKMB", "CKMBINDEX", "TROPONINI" in the last 168 hours.  BNP (last 3 results) No results for input(s): "PROBNP" in the last 8760 hours.  Lipid Profile: No  results for input(s): "CHOL", "HDL", "LDLCALC", "TRIG", "CHOLHDL", "LDLDIRECT" in the last 72 hours.  Thyroid Function Tests: No results for input(s): "TSH", "T4TOTAL", "FREET4", "T3FREE", "THYROIDAB" in the last 72 hours.  Anemia Panel: No results for input(s): "VITAMINB12", "FOLATE", "FERRITIN", "TIBC", "IRON", "RETICCTPCT" in the last 72 hours.   Urine analysis:    Component Value Date/Time   COLORURINE YELLOW 07/21/2022 1311   APPEARANCEUR CLEAR 07/21/2022 1311   LABSPEC >1.030 (H) 07/21/2022 1311   PHURINE 5.5 07/21/2022 1311   GLUCOSEU NEGATIVE 07/21/2022 1311   HGBUR NEGATIVE 07/21/2022 1311   BILIRUBINUR NEGATIVE 07/21/2022 1311   KETONESUR 15 (A) 07/21/2022 1311   PROTEINUR 30 (A) 07/21/2022 1311   UROBILINOGEN 0.2 02/21/2012 0055   NITRITE NEGATIVE 07/21/2022 1311   LEUKOCYTESUR NEGATIVE 07/21/2022 1311    Sepsis Labs: Lactic Acid, Venous    Component Value Date/Time   LATICACIDVEN 1.5 07/21/2022  1416    MICROBIOLOGY: Recent Results (from the past 240 hour(s))  Resp panel by RT-PCR (RSV, Flu A&B, Covid) Anterior Nasal Swab     Status: None   Collection Time: 07/09/2022  5:15 PM   Specimen: Anterior Nasal Swab  Result Value Ref Range Status   SARS Coronavirus 2 by RT PCR NEGATIVE NEGATIVE Final    Comment: (NOTE) SARS-CoV-2 target nucleic acids are NOT DETECTED.  The SARS-CoV-2 RNA is generally detectable in upper respiratory specimens during the acute phase of infection. The lowest concentration of SARS-CoV-2 viral copies this assay can detect is 138 copies/mL. A negative result does not preclude SARS-Cov-2 infection and should not be used as the sole basis for treatment or other patient management decisions. A negative result may occur with  improper specimen collection/handling, submission of specimen other than nasopharyngeal swab, presence of viral mutation(s) within the areas targeted by this assay, and inadequate number of viral copies(<138  copies/mL). A negative result must be combined with clinical observations, patient history, and epidemiological information. The expected result is Negative.  Fact Sheet for Patients:  EntrepreneurPulse.com.au  Fact Sheet for Healthcare Providers:  IncredibleEmployment.be  This test is no t yet approved or cleared by the [place] FDA and  has been authorized for detection and/or diagnosis of SARS-CoV-2 by FDA under an Emergency Use Authorization (EUA). This EUA will remain  in effect (meaning this test can be used) for the duration of the COVID-19 declaration under Section 564(b)(1) of the Act, 21 U.S.C.section 360bbb-3(b)(1), unless the authorization is terminated  or revoked sooner.       Influenza A by PCR NEGATIVE NEGATIVE Final   Influenza B by PCR NEGATIVE NEGATIVE Final    Comment: (NOTE) The Xpert Xpress SARS-CoV-2/FLU/RSV plus assay is intended as an aid in the diagnosis of influenza from Nasopharyngeal swab specimens and should not be used as a sole basis for treatment. Nasal washings and aspirates are unacceptable for Xpert Xpress SARS-CoV-2/FLU/RSV testing.  Fact Sheet for Patients: EntrepreneurPulse.com.au  Fact Sheet for Healthcare Providers: IncredibleEmployment.be  This test is not yet approved or cleared by the [place] FDA and has been authorized for detection and/or diagnosis of SARS-CoV-2 by FDA under an Emergency Use Authorization (EUA). This EUA will remain in effect (meaning this test can be used) for the duration of the COVID-19 declaration under Section 564(b)(1) of the Act, 21 U.S.C. section 360bbb-3(b)(1), unless the authorization is terminated or revoked.     Resp Syncytial Virus by PCR NEGATIVE NEGATIVE Final    Comment: (NOTE) Fact Sheet for Patients: EntrepreneurPulse.com.au  Fact Sheet for Healthcare  Providers: IncredibleEmployment.be  This test is not yet approved or cleared by the [place] FDA and has been authorized for detection and/or diagnosis of SARS-CoV-2 by FDA under an Emergency Use Authorization (EUA). This EUA will remain in effect (meaning this test can be used) for the duration of the COVID-19 declaration under Section 564(b)(1) of the Act, 21 U.S.C. section 360bbb-3(b)(1), unless the authorization is terminated or revoked.  Performed at [facility], [address], [place], [place] [address]   Surgical pcr screen     Status: None   Collection Time: 07/20/22  4:59 PM   Specimen: Nasal Mucosa; Nasal Swab  Result Value Ref Range Status   MRSA, PCR NEGATIVE NEGATIVE Final   Staphylococcus aureus NEGATIVE NEGATIVE Final    Comment: (NOTE) The Xpert SA Assay (FDA approved for NASAL specimens in patients 25 years of age and older), is one  component of a comprehensive surveillance program. It is not intended to diagnose infection nor to guide or monitor treatment. Performed at [facility], [place] [address], [place], [place] [address]   Culture, blood (Routine X 2) w Reflex to ID Panel     Status: None   Collection Time: 07/21/22 11:26 AM   Specimen: BLOOD LEFT ARM  Result Value Ref Range Status   Specimen Description BLOOD LEFT ARM  Final   Special Requests   Final    BOTTLES DRAWN AEROBIC AND ANAEROBIC Blood Culture adequate volume   Culture   Final    NO GROWTH 5 DAYS Performed at [facility], [place] [address], [place], [place] [address]    Report Status 07/26/2022 FINAL  Final  Culture, blood (Routine X 2) w Reflex to ID Panel     Status: None   Collection Time: 07/21/22 11:38 AM   Specimen: BLOOD LEFT HAND  Result Value Ref Range Status   Specimen Description BLOOD LEFT HAND  Final   Special Requests   Final    BOTTLES DRAWN AEROBIC AND ANAEROBIC Blood Culture adequate volume   Culture   Final    NO GROWTH 5  DAYS Performed at [facility], [place] [address] [place], [place] [address]    Report Status 07/26/2022 FINAL  Final  Urine Culture     Status: None   Collection Time: 07/21/22  1:11 PM   Specimen: Urine, Random  Result Value Ref Range Status   Specimen Description URINE, RANDOM  Final   Special Requests NONE Reflexed from [id number]  Final   Culture   Final    NO GROWTH Performed at [facility], [place] [address], [place], [place] [address]    Report Status 07/23/2022 FINAL  Final    RADIOLOGY STUDIES/RESULTS: No results found.   LOS: 9 days   Oren Binet, MD  [facility]    To contact the attending provider between 7A-7P or the covering provider during after hours 7P-7A, please log into the web site [email or web address] and access using universal [place] password for that web site. If you do not have the password, please call the hospital operator.  07/28/2022, 10:21 AM

## 2022-07-28 NOTE — Progress Notes (Signed)
This chaplain is present for F/U spiritual care. Family is not present. The Pt. is resting without distress. The chaplain shared bedside prayer with the Pt.  Chaplain Sallyanne Kuster [phone]

## 2022-07-28 NOTE — Progress Notes (Signed)
Speech Language Pathology Treatment: Dysphagia  Patient Details Name: Theresa Pace MRN: [id number] DOB: March 18, 1961 Today's Date: 07/28/2022 Time: [id number] SLP Time Calculation (min) (ACUTE ONLY): 10 min  Assessment / Plan / Recommendation Clinical Impression  Pt was seen for dysphagia treatment. She was alert during the session, but restless and moaning in apparent pain despite SLP's and RN's efforts to improve her comfort. Pt tolerated dysphagia 1 solids without overt s/s of aspiration, but demonstrated difficulty with bolus awareness and required some encouragement to swallow. Pt was unable to suck from a straw and exhibited no awareness of its presence despite SLP's prompts. Pt's current diet will be continued, but SLP questions how interested she will be in p.o. intake. SLP will continue to follow pt if this continues to align with GOC.   HPI HPI: Pt is a 62 year old who presented with worsening back pain and lower extremity weakness. MRI brain 3/2: Numerous enhancing lesions (16 identified) in the supratentorial and infratentorial brain consistent with metastatic disease. Mild edema surrounding the larger lesions and some intralesional hemorrhage. 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 Pt was taken to the OR on 2/28 for decompression by neurosurgery. Dx Acute toxic metabolic encephalopathy. Palliative care conversations ongoing as of 3/6. PMH: metastatic melanoma, COPD, chronic pain syndrome, ADHD-who is currently undergoing immunotherapy for metastatic melanoma. BSE 12/15/20: WNL; regular/thin recommended. BSE in 2022; regular/thin recommended.      SLP Plan  Continue with current plan of care      Recommendations for follow up therapy are one component of a multi-disciplinary discharge planning process, led by the attending physician.  Recommendations may be updated based on patient status, additional functional criteria and insurance  authorization.    Recommendations  Diet recommendations: Dysphagia 1 (puree);Thin liquid Liquids provided via: Straw;Cup Medication Administration: Crushed with puree (or via Cortrak) Supervision: Full supervision/cueing for compensatory strategies;Staff to assist with self feeding;Trained caregiver to feed patient Compensations: Slow rate;Small sips/bites Postural Changes and/or Swallow Maneuvers: Seated upright 90 degrees;Upright 30-60 min after meal                Oral Care Recommendations: Oral care BID Follow Up Recommendations: Skilled nursing-short term rehab (<3 hours/day) Assistance recommended at discharge: Frequent or constant Supervision/Assistance SLP Visit Diagnosis: Dysphagia, unspecified (R13.10) Plan: Continue with current plan of care         Tayveon Lombardo I. Hardin Negus, [place], [place] Office number [phone]  Horton Marshall  07/28/2022, 9:05 AM    .

## 2022-07-28 NOTE — TOC Progression Note (Addendum)
Transition of Care [facility]) - Progression Note    Patient Details  Name: Theresa Pace MRN: [id number] Date of Birth: December 03, 1960  Transition of Care [facility]) CM/SW Contact  Vinie Sill, [place] Phone Number: 07/28/2022, 11:39 AM  Clinical Narrative:     CSW received consult for [facility]- referral was made w/ [facility]. She reports no currents bed but will follow up w/ CSW.   TOC will continue to follow and assist with discharge planning.  Thurmond Butts, MSW, LCSW Clinical Social Worker     Expected Discharge Plan: Home w Hospice Care Barriers to Discharge: Continued Medical Work up  Expected Discharge Plan and Services                                               Social Determinants of Health (SDOH) Interventions SDOH Screenings   Food Insecurity: No Food Insecurity (07/20/2022)  Housing: Low Risk  (07/20/2022)  Transportation Needs: No Transportation Needs (07/20/2022)  Utilities: Not At Risk (07/20/2022)  Tobacco Use: Medium Risk (07/22/2022)    Readmission Risk Interventions     No data to display

## 2022-07-28 NOTE — Progress Notes (Signed)
OT Cancellation Note  Patient Details Name: Theresa Pace MRN: [id number] DOB: 1960/08/20   Cancelled Treatment:    Reason Eval/Treat Not Completed: Other (comment)  Pt now comfort care, will sign off for OT  Shakyla Nolley OTR/L 07/28/2022, 10:09 AM

## 2022-07-28 NOTE — Progress Notes (Addendum)
Palliative:  HPI: 62 y.o. 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 Hospitalization complicated by new finding of brain metastasis with symptoms of confusion and seizures.       I discussed with Dr. Sloan Leiter and bedside RN. Dr. Sloan Leiter has already spoken with husband, Theresa Pace, and recommended transition to comfort care. Theresa Pace is unchanged and continues to be agitated/restless and in pain, confused, encephalopathic. No improvements. Per RN medication is effective for relief but only briefly.   I called and spoke with Theresa Pace. Theresa Pace confirms plan for transition to comfort care. He agrees with dilaudid infusion to better provide relief of symptoms and comfort. We discussed d/c Cortak and focus on interventions that will provide her comfort only. We also discussed place of end of life care and Theresa Pace would like to transition her to hospice facility in [place]. I will ask assistance from [facility] to coordinate with transition to hospice.   Update: I received call from Theresa Pace who had questions about fluids, end of life, hospice, and funeral arrangements. We discussed and I was able to answer all his questions. No changes in goals of care - continue full comfort care.   All questions/concerns addressed. Emotional support provided. Orders adjusted to reflect comfort care. Discussed with Dr. Sloan Leiter, RN, TOC.   Exam: Restless, agitated, pain - relieved with dilaudid + Ativan combination briefly. Will answer some simple questions or respond to family inconsistently. Not following commands. Tachycardic. Breathing regular, unlabored. Abd soft.   Plan: - DNR, full comfort care - Dilaudid infusion ordered for comfort. Titrate basal and bolus to ensure comfort.  - Hopeful for transition to hospice facility  [facility].  [place], NP Palliative Medicine Team Pager [phone] (Please see [email or web address] for schedule) Team Phone [phone]    Greater than 50%  of this time was spent counseling and coordinating care related to the above assessment and plan

## 2022-07-28 NOTE — Telephone Encounter (Signed)
error 

## 2022-07-29 DIAGNOSIS — Z7189 Other specified counseling: Secondary | ICD-10-CM | POA: Diagnosis not present

## 2022-07-29 DIAGNOSIS — Z515 Encounter for palliative care: Secondary | ICD-10-CM | POA: Diagnosis not present

## 2022-07-29 DIAGNOSIS — M4804 Spinal stenosis, thoracic region: Secondary | ICD-10-CM | POA: Diagnosis not present

## 2022-07-29 LAB — GLUCOSE, CAPILLARY: Glucose-Capillary: 195 mg/dL — ABNORMAL HIGH (ref 70–99)

## 2022-07-29 MED ORDER — SCOPOLAMINE 1 MG/3DAYS TD PT72
1.0000 | MEDICATED_PATCH | TRANSDERMAL | Status: DC
  Administered 2022-07-29: 1.5 mg via TRANSDERMAL
  Filled 2022-07-29: qty 1

## 2022-07-29 MED ORDER — GLYCOPYRROLATE 0.2 MG/ML IJ SOLN
0.4000 mg | INTRAMUSCULAR | Status: DC
  Administered 2022-07-29 – 2022-07-30 (×6): 0.4 mg via INTRAVENOUS
  Filled 2022-07-29 (×6): qty 2

## 2022-07-29 MED ORDER — ATROPINE SULFATE 1 % OP SOLN
2.0000 [drp] | Freq: Four times a day (QID) | OPHTHALMIC | Status: DC
  Administered 2022-07-29 – 2022-07-30 (×5): 2 [drp] via SUBLINGUAL
  Filled 2022-07-29: qty 2

## 2022-07-29 MED ORDER — LORAZEPAM 2 MG/ML IJ SOLN
1.0000 mg | INTRAMUSCULAR | Status: DC
  Administered 2022-07-29 – 2022-07-30 (×7): 1 mg via INTRAVENOUS
  Filled 2022-07-29 (×7): qty 1

## 2022-07-29 MED ORDER — GLYCOPYRROLATE 0.2 MG/ML IJ SOLN
0.4000 mg | INTRAMUSCULAR | Status: DC | PRN
  Administered 2022-07-29: 0.4 mg via INTRAVENOUS
  Filled 2022-07-29: qty 2

## 2022-07-29 NOTE — Progress Notes (Signed)
   Palliative Medicine Inpatient Follow Up Note   HPI: 62 y.o. female  with past medical history of metastatic melanoma, COPD, left bundle branch block, tachycardia, chronic pain syndrome, ADHD, GERD, IBS admitted on 08/17/2022 with metastatic cord compression s/p surgical resection of tumor at T8-T9 on 07/20/22. Hospitalization complicated by new finding of brain metastasis with symptoms of confusion and seizures.        Today's Discussion 07/29/2022  *Please note that this is a verbal dictation therefore any spelling or grammatical errors are due to the "[employer]" system interpretation.  Chart reviewed inclusive of vital signs, progress notes, laboratory results, and diagnostic images.   PMT asked by RN, Theresa Pace to symptom check as patient had elevated HR's and secretions.   I met with Theresa Pace at bedside. She was noted to have tremendous secretion burden which was tracheal. I was able to deep suction x1. Provided dilaudid bolus x2 during my time at bedside and increase gtt rate to 3mg /hr. I was able with patients RN, Theresa Pace to help reposition patient. Added a scopolamine patch.  Theresa Pace appeared anxious and was moving her arms therefore added ativan ATC. Liberalized PRN robinul dosing.   Patient appeared more comfortable after the above interventions.  I called patients spouse, Theresa Pace and left a HIPAA compliant VM.   Questions and concerns addressed/Palliative Support Provided.   Objective Assessment: Vital Signs Vitals:   07/28/22 1927 07/29/22 0750  BP:  138/72  Pulse:  (!) 170  Resp:  (!) 23  Temp: 99.6 F (37.6 C) 98.3 F (36.8 C)  SpO2: 94%     Intake/Output Summary (Last 24 hours) at 07/29/2022 1201 Last data filed at 07/29/2022 0900 Gross per 24 hour  Intake 0 ml  Output 100 ml  Net -100 ml   Last Weight  Most recent update: 07/28/2022  5:14 AM    Weight  68.1 kg (150 lb 2.1 oz)            Gen: Frail elderly caucasian F in severe distress HEENT: Dry mucous  membranes CV: Irregular rate and rhythm  PULM: (+) Secretions - audible, breathing rapid ABD: soft/nontender  EXT: Generalized edema  Neuro: Opens eyes but does not respond  SUMMARY OF RECOMMENDATIONS   DNAR/DNI  Comfort Care  Added comfort medications --> Scopolamine patch, increase dilaudid gtt rate, ativan 1mg  IVP ATC, glycopyrrolate 0.4mg  IVP Q4H PRN  Anticipate in hospital death  Ongoing PMT support  Billing based on MDM: High ______________________________________________________________________________________ [place] Team Team Cell Phone: [phone] Please utilize secure chat with additional questions, if there is no response within 30 minutes please call the above phone number  Palliative Medicine Team providers are available by phone from 7am to 7pm daily and can be reached through the team cell phone.  Should this patient require assistance outside of these hours, please call the patient's attending physician.

## 2022-07-29 NOTE — Progress Notes (Signed)
This chaplain is present at the Pt. bedside for EOL spiritual presence and prayer. Family is not at the bedside. The chaplain observed the Pt. resting without distress. The chaplain was updated by and appreciates the RN-Sarah attentiveness to the Pt. EOL care.  Chaplain Sallyanne Kuster [phone]

## 2022-07-29 NOTE — TOC Progression Note (Signed)
Transition of Care [facility]) - Progression Note    Patient Details  Name: Theresa Pace MRN: [id number] Date of Birth: 12-Aug-1960  [facility] CM/SW [place] [place] Phone Number: 07/29/2022, 2:30 PM  Clinical Narrative:     Informed [place]- patient will not be transferred to there facility- per MD anticipate hospital death.   Thurmond Butts, MSW, LCSW Clinical Social Worker     Expected Discharge Plan: Home w Hospice Care Barriers to Discharge: Continued Medical Work up  Expected Discharge Plan and Services                                               Social Determinants of Health (SDOH) Interventions SDOH Screenings   Food Insecurity: No Food Insecurity (07/20/2022)  Housing: Low Risk  (07/20/2022)  Transportation Needs: No Transportation Needs (07/20/2022)  Utilities: Not At Risk (07/20/2022)  Tobacco Use: Medium Risk (07/22/2022)    Readmission Risk Interventions     No data to display

## 2022-07-29 NOTE — Progress Notes (Signed)
PROGRESS NOTE        PATIENT DETAILS Name: Theresa Pace Age: 62 y.o. Sex: female Date of Birth: Jul 18, 1960 Admit Date: 06/23/2022 Admitting Physician Earnie Larsson, MD [id number], [place] [facility]  Brief Summary: 62 year old with history of metastatic melanoma, COPD, chronic pain syndrome, ADHD-who is currently undergoing immunotherapy for metastatic melanoma-presented to the hospital with worsening back pain and lower extremity weakness-she evaluated with an MRI which showed pathological T8/T9 fractures and epidural extension of the tumor-she was taken to the OR on 2/28 for decompression by neurosurgery. TRH was consulted on 2/29 for evaluation of confusion-on 3/1-patient was found to have breakthrough seizures.  See below for further details.   Significant events: 2/27>> admit by neurosurgery 2/28>> T8-T9 decompressive laminectomy with resection of tumor 2/29>> consult to TRH-TRH assumed primary service. 3/01>> seizure-like activity-neurology consulted. 3/06>> discussed with radiation oncology-increase Decadron-if no improvement-okay for comfort care. 3/07>> transitioned comfort care  Significant studies: 2/27>> CT abdomen/pelvis: Diffuse progressive metastatic disease throughout lower chest/abdomen/pelvis. 2/27>> MRI T-spine: T8-T9 vertebral bodies compression fracture, extraosseous/epidural extension into the ventral epidural space at T9 with resultant severe spinal stenosis/cord compression. 2/27>> MRI L-spine: Multiple osseous mets involving lumbar spine, no extraosseous or epidural extension of tumor. 2/29>> CXR: Right mid lung alveolar process unchanged. 3/1-3/2>>LTM  EEG: No seizures. 3/02>> MRI brain: Numerous enhancing lesions in the supratentorial and infratentorial brain-consistent with metastatic disease. 3/04>> echo: EF 55-60%  Significant microbiology data: 2/27>> COVID/influenza/RSV PCR: Negative 2/29>> blood culture: No  growth  Procedures: 2/28>> T8-T9 decompressive laminectomy with resection of tumor  Consults: Neurology Neurosurgery  Subjective: Comfortable-accumulating some secretions.  On Dilaudid infusion.  Objective: Vitals: Blood pressure 138/72, pulse (!) 170, temperature 98.3 F (36.8 C), temperature source Oral, resp. rate (!) 23, height '5\' 2"'$  (1.575 m), weight 68.1 kg, SpO2 94 %.   Exam: Comfortable-barely responding  Pertinent Labs/Radiology:    Latest Ref Rng & Units 07/28/2022    4:41 AM 07/27/2022   11:11 AM 07/26/2022    4:52 AM  CBC  WBC 4.0 - 10.5 K/uL 16.0  17.6  17.9   Hemoglobin 12.0 - 15.0 g/dL 9.3  9.9  10.3   Hematocrit 36.0 - 46.0 % 30.0  30.5  31.2   Platelets 150 - 400 K/uL 299  373  453     Lab Results  Component Value Date   NA 136 07/28/2022   K 5.4 (H) 07/28/2022   CL 105 07/28/2022   CO2 19 (L) 07/28/2022     Assessment/Plan: Metastatic melanoma with T8/T9 pathologic fracture  Cord compression at T8-T9 level Epidural tumor with myelopathy S/p T8-T9 decompressive laminectomy with resection of tumor on 2/28 Widespread metastatic melanoma with brain mets Postoperative care was managed by neurosurgery Unfortunately appears to have a significant metastatic burden-remained persistently encephalopathic with severe failure to thrive syndrome postoperatively-no response to numerous changes in medications.  No response to high-dose Decadron as recommended by radiation oncology.  No significant oral intake since admission. After extensive discussion with family-has been transitioned to full comfort measures on 3/7.  Acute toxic metabolic encephalopathy Breakthrough seizure Initially thought to have acute encephalopathy in the setting of postop status/narcotics/UTI-however had seizure-like activity on 3/1-and subsequently started on IV Keppra.  LTM EEG was negative for seizures-once MRI brain showed multiple mets-neurology recommended palliative care/hospice  care. Due to prolonged QTc-unable to use Haldol/Seroquel Per nursing staff-restlessness/encephalopathy  at times responds to narcotics.  After extensive discussion with family-has been transitioned to full comfort measures on 3/7.  Hypokalemia/hypomagnesemia Repleted.  Prolonged QTc Plan was to keep keep K>4. Mg>2 Avoid QTc prolonging agents  Normocytic anemia Likely due to acute illness/underlying malignancy  COPD Not in exacerbation Continue bronchodilators   Complicated UTI Completed course of IV Rocephin  ?  PAF/MAT Did have few episodes of narrow complex tachycardia Echo stable EF. Not safe for anticoagulation given recent thoracic spine surgery.    HLD Statin   Chronic pain syndrome IV Dilaudid for severe pain, Norco for moderate pain On chronic narcotics at home   HTN Relatively stable Was on Cardizem/IV metoprolol  ADHD/fibromyalgia/depression Trazodone held for prolonged QTc Holding Adderall  Failure to thrive syndrome Palliative care Per spouse-had decreased oral intake given prior to this hospitalization Per nursing staff-no significant oral intake over the past several days due to severe encephalopathy S/p Cortrak tube placement on 3/4-continue feedings for now.  DNR in place Very poor overall prognosis-at failure to thrive syndrome even prior to this hospitalization but unfortunately postoperatively developed severe encephalopathy-after extensive discussion with family by this MD and the palliative care team-has been transitioned to full comfort measures.  Awaiting residential hospice bed but may not survive this hospitalization.  Nutrition Status: Nutrition Problem: Moderate Malnutrition Etiology: chronic illness (metastatic melanoma) Signs/Symptoms: mild fat depletion, moderate fat depletion, moderate muscle depletion, severe muscle depletion Interventions: Tube feeding    BMI: Estimated body mass index is 27.46 kg/m as calculated from the  following:   Height as of this encounter: '5\' 2"'$  (1.575 m).   Weight as of this encounter: 68.1 kg.   Code status:   Code Status: DNR   DVT Prophylaxis:    Family Communication: Spouse-Riley-[phone]-updated over the phone on 3/7.   Disposition Plan: Status is: Inpatient Remains inpatient appropriate because: Severity of illness   Planned Discharge Destination:Skilled nursing facility   Diet: Diet Order             DIET - DYS 1 Room service appropriate? No; Fluid consistency: Thin  Diet effective now                     Antimicrobial agents: Anti-infectives (From admission, onward)    Start     Dose/Rate Route Frequency Ordered Stop   07/22/22 0915  cefTRIAXone (ROCEPHIN) 2 g in sodium chloride 0.9 % 100 mL IVPB        2 g 200 mL/hr over 30 Minutes Intravenous Every 24 hours 07/21/22 1016 07/24/22 1034   07/21/22 0915  cefTRIAXone (ROCEPHIN) 1 g in sodium chloride 0.9 % 100 mL IVPB  Status:  Discontinued        1 g 200 mL/hr over 30 Minutes Intravenous Every 24 hours 07/21/22 0821 07/21/22 1016   07/20/22 2215  ceFAZolin (ANCEF) IVPB 1 g/50 mL premix        1 g 100 mL/hr over 30 Minutes Intravenous Every 8 hours 07/20/22 2118 07/21/22 0610   07/20/22 1007  ceFAZolin (ANCEF) IVPB 2g/100 mL premix        2 g 200 mL/hr over 30 Minutes Intravenous 30 min pre-op 07/20/22 1007 07/20/22 1810        MEDICATIONS: Scheduled Meds:  atropine  2 drop Sublingual QID   sodium chloride flush  10-40 mL Intracatheter Q12H   Continuous Infusions:  sodium chloride 10 mL/hr at 07/28/22 1146   HYDROmorphone 2 mg/hr (07/29/22 0407)   levETIRAcetam 500  mg (07/29/22 0916)   methocarbamol (ROBAXIN) IV Stopped (07/24/22 1630)   PRN Meds:.acetaminophen **OR** acetaminophen, albuterol, antiseptic oral rinse, bisacodyl, glycopyrrolate **OR** glycopyrrolate **OR** glycopyrrolate, HYDROmorphone, LORazepam, methocarbamol (ROBAXIN) IV, metoprolol tartrate, ondansetron (ZOFRAN) IV  **OR** ondansetron, polyvinyl alcohol, sodium chloride flush   I have personally reviewed following labs and imaging studies  LABORATORY DATA: CBC: Recent Labs  Lab 07/24/22 0751 07/25/22 0757 07/26/22 0452 07/27/22 1111 07/28/22 0441  WBC 12.9* 18.9* 17.9* 17.6* 16.0*  HGB 9.5* 8.9* 10.3* 9.9* 9.3*  HCT 29.4* 27.2* 31.2* 30.5* 30.0*  MCV 93.0 91.6 92.3 93.8 96.5  PLT 383 471* 453* 373 299     Basic Metabolic Panel: Recent Labs  Lab 07/24/22 0751 07/25/22 0541 07/25/22 0757 07/26/22 0452 07/27/22 0449 07/28/22 0441  NA 141  --  135 134* 136 136  K 3.8  --  3.2* 5.0 5.1 5.4*  CL 113*  --  104 106 104 105  CO2 17*  --  21* 16* 22 19*  GLUCOSE 104*  --  154* 190* 142* 170*  BUN 16  --  '11 10 13 17  '$ CREATININE 0.60  --  0.45 0.74 0.53 0.58  CALCIUM 7.6*  --  7.7* 7.8* 8.6* 8.1*  MG  --  1.9 1.7 2.2 2.2 2.2  PHOS  --   --  1.6* 2.6 2.7  --      GFR: Estimated Creatinine Clearance: 67.6 mL/min (by C-G formula based on SCr of 0.58 mg/dL).  Liver Function Tests: Recent Labs  Lab 07/23/22 0819 07/25/22 0757  AST 20 24  ALT 12 16  ALKPHOS 70 72  BILITOT 0.6 0.6  PROT 4.9* 5.3*  ALBUMIN 2.0* 2.2*    No results for input(s): "LIPASE", "AMYLASE" in the last 168 hours. No results for input(s): "AMMONIA" in the last 168 hours.   Coagulation Profile: No results for input(s): "INR", "PROTIME" in the last 168 hours.  Cardiac Enzymes: No results for input(s): "CKTOTAL", "CKMB", "CKMBINDEX", "TROPONINI" in the last 168 hours.  BNP (last 3 results) No results for input(s): "PROBNP" in the last 8760 hours.  Lipid Profile: No results for input(s): "CHOL", "HDL", "LDLCALC", "TRIG", "CHOLHDL", "LDLDIRECT" in the last 72 hours.  Thyroid Function Tests: No results for input(s): "TSH", "T4TOTAL", "FREET4", "T3FREE", "THYROIDAB" in the last 72 hours.  Anemia Panel: No results for input(s): "VITAMINB12", "FOLATE", "FERRITIN", "TIBC", "IRON", "RETICCTPCT" in the last  72 hours.   Urine analysis:    Component Value Date/Time   COLORURINE YELLOW 07/21/2022 1311   APPEARANCEUR CLEAR 07/21/2022 1311   LABSPEC >1.030 (H) 07/21/2022 1311   PHURINE 5.5 07/21/2022 1311   GLUCOSEU NEGATIVE 07/21/2022 1311   HGBUR NEGATIVE 07/21/2022 1311   BILIRUBINUR NEGATIVE 07/21/2022 1311   KETONESUR 15 (A) 07/21/2022 1311   PROTEINUR 30 (A) 07/21/2022 1311   UROBILINOGEN 0.2 02/21/2012 0055   NITRITE NEGATIVE 07/21/2022 1311   LEUKOCYTESUR NEGATIVE 07/21/2022 1311    Sepsis Labs: Lactic Acid, Venous    Component Value Date/Time   LATICACIDVEN 1.5 07/21/2022 1416    MICROBIOLOGY: Recent Results (from the past 240 hour(s))  Resp panel by RT-PCR (RSV, Flu A&B, Covid) Anterior Nasal Swab     Status: None   Collection Time: 07/18/2022  5:15 PM   Specimen: Anterior Nasal Swab  Result Value Ref Range Status   SARS Coronavirus 2 by RT PCR NEGATIVE NEGATIVE Final    Comment: (NOTE) SARS-CoV-2 target nucleic acids are NOT DETECTED.  The SARS-CoV-2 RNA is generally detectable  in upper respiratory specimens during the acute phase of infection. The lowest concentration of SARS-CoV-2 viral copies this assay can detect is 138 copies/mL. A negative result does not preclude SARS-Cov-2 infection and should not be used as the sole basis for treatment or other patient management decisions. A negative result may occur with  improper specimen collection/handling, submission of specimen other than nasopharyngeal swab, presence of viral mutation(s) within the areas targeted by this assay, and inadequate number of viral copies(<138 copies/mL). A negative result must be combined with clinical observations, patient history, and epidemiological information. The expected result is Negative.  Fact Sheet for Patients:  EntrepreneurPulse.com.au  Fact Sheet for Healthcare Providers:  IncredibleEmployment.be  This test is no t yet approved or  cleared by the [place] FDA and  has been authorized for detection and/or diagnosis of SARS-CoV-2 by FDA under an Emergency Use Authorization (EUA). This EUA will remain  in effect (meaning this test can be used) for the duration of the COVID-19 declaration under Section 564(b)(1) of the Act, 21 U.S.C.section 360bbb-3(b)(1), unless the authorization is terminated  or revoked sooner.       Influenza A by PCR NEGATIVE NEGATIVE Final   Influenza B by PCR NEGATIVE NEGATIVE Final    Comment: (NOTE) The Xpert Xpress SARS-CoV-2/FLU/RSV plus assay is intended as an aid in the diagnosis of influenza from Nasopharyngeal swab specimens and should not be used as a sole basis for treatment. Nasal washings and aspirates are unacceptable for Xpert Xpress SARS-CoV-2/FLU/RSV testing.  Fact Sheet for Patients: EntrepreneurPulse.com.au  Fact Sheet for Healthcare Providers: IncredibleEmployment.be  This test is not yet approved or cleared by the [place] FDA and has been authorized for detection and/or diagnosis of SARS-CoV-2 by FDA under an Emergency Use Authorization (EUA). This EUA will remain in effect (meaning this test can be used) for the duration of the COVID-19 declaration under Section 564(b)(1) of the Act, 21 U.S.C. section 360bbb-3(b)(1), unless the authorization is terminated or revoked.     Resp Syncytial Virus by PCR NEGATIVE NEGATIVE Final    Comment: (NOTE) Fact Sheet for Patients: EntrepreneurPulse.com.au  Fact Sheet for Healthcare Providers: IncredibleEmployment.be  This test is not yet approved or cleared by the [place] FDA and has been authorized for detection and/or diagnosis of SARS-CoV-2 by FDA under an Emergency Use Authorization (EUA). This EUA will remain in effect (meaning this test can be used) for the duration of the COVID-19 declaration under Section 564(b)(1) of the Act, 21  U.S.C. section 360bbb-3(b)(1), unless the authorization is terminated or revoked.  Performed at [facility], [address] [place], [place] [address]   Surgical pcr screen     Status: None   Collection Time: 07/20/22  4:59 PM   Specimen: Nasal Mucosa; Nasal Swab  Result Value Ref Range Status   MRSA, PCR NEGATIVE NEGATIVE Final   Staphylococcus aureus NEGATIVE NEGATIVE Final    Comment: (NOTE) The Xpert SA Assay (FDA approved for NASAL specimens in patients 64 years of age and older), is one component of a comprehensive surveillance program. It is not intended to diagnose infection nor to guide or monitor treatment. Performed at [facility], [place] [address], [place], [place] [address]   Culture, blood (Routine X 2) w Reflex to ID Panel     Status: None   Collection Time: 07/21/22 11:26 AM   Specimen: BLOOD LEFT ARM  Result Value Ref Range Status   Specimen Description BLOOD LEFT ARM  Final  Special Requests   Final    BOTTLES DRAWN AEROBIC AND ANAEROBIC Blood Culture adequate volume   Culture   Final    NO GROWTH 5 DAYS Performed at [facility], [place] [address] [place], [place] [address]    Report Status 07/26/2022 FINAL  Final  Culture, blood (Routine X 2) w Reflex to ID Panel     Status: None   Collection Time: 07/21/22 11:38 AM   Specimen: BLOOD LEFT HAND  Result Value Ref Range Status   Specimen Description BLOOD LEFT HAND  Final   Special Requests   Final    BOTTLES DRAWN AEROBIC AND ANAEROBIC Blood Culture adequate volume   Culture   Final    NO GROWTH 5 DAYS Performed at [facility], [place] [address], [place], [place] [address]    Report Status 07/26/2022 FINAL  Final  Urine Culture     Status: None   Collection Time: 07/21/22  1:11 PM   Specimen: Urine, Random  Result Value Ref Range Status   Specimen Description URINE, RANDOM  Final   Special Requests NONE Reflexed from [id number]  Final   Culture   Final    NO GROWTH Performed at [facility], [place] [address] [place], [place] [address]    Report Status 07/23/2022 FINAL  Final    RADIOLOGY STUDIES/RESULTS: No results found.   LOS: 10 days   Oren Binet, MD  [facility]    To contact the attending provider between 7A-7P or the covering provider during after hours 7P-7A, please log into the web site [email or web address] and access using universal [place] password for that web site. If you do not have the password, please call the hospital operator.  07/29/2022, 11:15 AM

## 2022-07-30 DIAGNOSIS — Z515 Encounter for palliative care: Secondary | ICD-10-CM | POA: Diagnosis not present

## 2022-07-30 DIAGNOSIS — M4804 Spinal stenosis, thoracic region: Secondary | ICD-10-CM | POA: Diagnosis not present

## 2022-08-22 NOTE — Death Summary Note (Signed)
DEATH SUMMARY   Patient Details  Name: Theresa Pace MRN: [id number] DOB: 17-Jun-1960 [id number], [facility] Admission/Discharge Information   Admit Date:  Jul 21, 2022  Date of Death: Date of Death: 2022-08-01  Time of Death: Time of Death: 76  Length of Stay: 03-Aug-2022   Principle Cause of death: Metastatic Melanoma  Hospital Diagnoses: Principal Problem:   Thoracic spinal stenosis Active Problems:   Metastasis of neoplasm to spinal canal ([place])   Malnutrition of moderate degree   Hospital Course: 62 year old with history of metastatic melanoma, COPD, chronic pain syndrome, ADHD-who is currently undergoing immunotherapy for metastatic melanoma-presented to the hospital with worsening back pain and lower extremity weakness-she evaluated with an MRI which showed pathological T8/T9 fractures and epidural extension of the tumor-she was taken to the OR on 2/28 for decompression by neurosurgery. TRH was consulted on 2/29 for evaluation of confusion-on 3/1-patient was found to have breakthrough seizures.  See below for further details.    Significant events: 2/27>> admit by neurosurgery 2/28>> T8-T9 decompressive laminectomy with resection of tumor 2/29>> consult to TR[facility] assumed primary service. 3/01>> seizure-like activity-neurology consulted. 3/06>> discussed with radiation oncology-increase Decadron-if no improvement-okay for comfort care. 3/07>> transitioned comfort care   Significant studies: 2/27>> CT abdomen/pelvis: Diffuse progressive metastatic disease throughout lower chest/abdomen/pelvis. 2/27>> MRI T-spine: T8-T9 vertebral bodies compression fracture, extraosseous/epidural extension into the ventral epidural space at T9 with resultant severe spinal stenosis/cord compression. 2/27>> MRI L-spine: Multiple osseous mets involving lumbar spine, no extraosseous or epidural extension of tumor. 2/29>> CXR: Right mid lung alveolar process unchanged. 3/1-3/2>>LTM  EEG: No  seizures. 3/02>> MRI brain: Numerous enhancing lesions in the supratentorial and infratentorial brain-consistent with metastatic disease. 3/04>> echo: EF 55-60%   Significant microbiology data: 2/27>> COVID/influenza/RSV PCR: Negative 2/29>> blood culture: No growth   Procedures: 2/28>> T8-T9 decompressive laminectomy with resection of tumor   Consults: Neurology Neurosurgery  Assessment and Plan: Metastatic melanoma with T8/T9 pathologic fracture  Cord compression at T8-T9 level Epidural tumor with myelopathy S/p T8-T9 decompressive laminectomy with resection of tumor on 2/28 Widespread metastatic melanoma with brain mets Postoperative care was managed by neurosurgery Unfortunately appears to have a significant metastatic burden-remained persistently encephalopathic with severe failure to thrive syndrome postoperatively-no response to numerous changes in medications.  No response to high-dose Decadron as recommended by radiation oncology.  No significant oral intake since admission. After extensive discussion with family-has been transitioned to full comfort measures on 3/7.     Acute toxic metabolic encephalopathy Breakthrough seizure Initially thought to have acute encephalopathy in the setting of postop status/narcotics/UTI-however had seizure-like activity on 3/1-and subsequently started on IV Keppra.  LTM EEG was negative for seizures-once MRI brain showed multiple mets-neurology recommended palliative care/hospice care. Due to prolonged QTc-unable to use Haldol/Seroquel Per nursing staff-restlessness/encephalopathy at times responds to narcotics.  After extensive discussion with family-has been transitioned to full comfort measures on 3/7.   Hypokalemia/hypomagnesemia Repleted.   Prolonged QTc Plan was to keep keep K>4. Mg>2 Avoid QTc prolonging agents   Normocytic anemia Likely due to acute illness/underlying malignancy   COPD Not in exacerbation Was on  bronchodilators   Complicated UTI Completed course of IV Rocephin   ?  PAF/MAT Did have few episodes of narrow complex tachycardia Echo stable EF. Not safe for anticoagulation given recent thoracic spine surgery.     HLD Statin   Chronic pain syndrome Was on IV Dilaudid for severe pain, Norco for moderate pain On chronic narcotics at home   HTN Relatively  stable Was on Cardizem/IV metoprolol   ADHD/fibromyalgia/depression Trazodone held for prolonged QTc Holding Adderall   Failure to thrive syndrome Palliative care Per spouse-had decreased oral intake given prior to this hospitalization Per nursing staff-no significant oral intake over the past several days due to severe encephalopathy S/p Cortrak tube placement on 3/4-was on tube feeds for several days, cortrak was discontinued once patient was transitioned to comfort care  DNR in place Very poor overall prognosis-at failure to thrive syndrome even prior to this hospitalization but unfortunately postoperatively developed severe encephalopathy-after extensive discussion with family by this MD and the palliative care team-has been transitioned to full comfort measures.     Nutrition Status: Nutrition Problem: Moderate Malnutrition Etiology: chronic illness (metastatic melanoma) Signs/Symptoms: mild fat depletion, moderate fat depletion, moderate muscle depletion, severe muscle depletion Interventions: Tube feeding    BMI: Estimated body mass index is 27.46 kg/m as calculated from the following:   Height as of this encounter: '5\' 2"'$  (1.575 m).   Weight as of this encounter: 68.1 kg.   The results of significant diagnostics from this hospitalization (including imaging, microbiology, ancillary and laboratory) are listed below for reference.   Significant Diagnostic Studies: DG Abd Portable 1V  Result Date: 07/25/2022 CLINICAL DATA:  [id number] Encounter for feeding tube placement [id number] EXAM: PORTABLE ABDOMEN - 1 VIEW  COMPARISON:  06/23/2022 FINDINGS: Limited radiograph of the lower chest and upper abdomen was obtained for the purposes of enteric tube localization. Enteric tube is seen coursing below the diaphragm with distal tip and terminating within the expected location of the gastric body. IMPRESSION: Enteric tube tip projects within the expected location of the gastric body. Electronically Signed   By: Davina Poke D.O.   On: 07/25/2022 12:43   ECHOCARDIOGRAM COMPLETE  Result Date: 07/25/2022    ECHOCARDIOGRAM REPORT   Patient Name:   SUHAIL DEANDA Date of Exam: 07/25/2022 Medical Rec #:  [id number]     Height:       62.0 in Accession #:    [id number]    Weight:       149.9 lb Date of Birth:  1960-10-22     BSA:          1.691 m Patient Age:    85 years      BP:           136/84 mmHg Patient Gender: F             HR:           84 bpm. Exam Location:  Inpatient Procedure: 2D Echo, Color Doppler and Cardiac Doppler Indications:    I48.91* Unspeicified atrial fibrillation  History:        Patient has prior history of Echocardiogram examinations, most                 recent 06/26/2019. Abnormal ECG, COPD, Arrythmias:Tachycardia,                 Signs/Symptoms:Shortness of Breath, Dyspnea and Altered Mental                 Status; Risk Factors:Hypertension and Current Smoker. Metastatic                 disease.  Sonographer:    Roseanna Rainbow RDCS Referring Phys: [place]  1. Difficult acoustic windows. Definity uses Septal hypokinesis. . Left ventricular ejection fraction, by estimation, is 55 to 60%. The left ventricle has normal function. Left ventricular  diastolic parameters are indeterminate.  2. Right ventricular systolic function is normal. The right ventricular size is normal.  3. Trivial mitral valve regurgitation.  4. The aortic valve is tricuspid. Aortic valve regurgitation is not visualized.  5. The inferior vena cava is normal in size with greater than 50% respiratory variability, suggesting  right atrial pressure of 3 mmHg. Comparison(s): The left ventricular function is unchanged. FINDINGS  Left Ventricle: Difficult acoustic windows. Definity uses Septal hypokinesis. Left ventricular ejection fraction, by estimation, is 55 to 60%. The left ventricle has normal function. The left ventricular internal cavity size was normal in size. There is  no left ventricular hypertrophy. Left ventricular diastolic parameters are indeterminate. Right Ventricle: The right ventricular size is normal. Right vetricular wall thickness was not assessed. Right ventricular systolic function is normal. Left Atrium: Left atrial size was normal in size. Right Atrium: Right atrial size was normal in size. Pericardium: There is no evidence of pericardial effusion. Mitral Valve: There is mild thickening of the mitral valve leaflet(s). Trivial mitral valve regurgitation. Tricuspid Valve: The tricuspid valve is normal in structure. Tricuspid valve regurgitation is trivial. Aortic Valve: The aortic valve is tricuspid. Aortic valve regurgitation is not visualized. Pulmonic Valve: The pulmonic valve was not well visualized. Pulmonic valve regurgitation is not visualized. No evidence of pulmonic stenosis. Aorta: The aortic root and ascending aorta are structurally normal, with no evidence of dilitation. Venous: The inferior vena cava is normal in size with greater than 50% respiratory variability, suggesting right atrial pressure of 3 mmHg. IAS/Shunts: No atrial level shunt detected by color flow Doppler.  LEFT VENTRICLE PLAX 2D LVIDd:         4.40 cm     Diastology LVIDs:         3.20 cm     LV e' medial:    6.20 cm/s LV PW:         1.00 cm     LV E/e' medial:  14.2 LV IVS:        0.90 cm     LV e' lateral:   7.62 cm/s LVOT diam:     2.30 cm     LV E/e' lateral: 11.6 LV SV:         82 LV SV Index:   48 LVOT Area:     4.15 cm  LV Volumes (MOD) LV vol d, MOD A2C: 95.6 ml LV vol d, MOD A4C: 95.5 ml LV vol s, MOD A2C: 45.2 ml LV vol s, MOD  A4C: 37.0 ml LV SV MOD A2C:     50.3 ml LV SV MOD A4C:     95.5 ml LV SV MOD BP:      54.9 ml RIGHT VENTRICLE             IVC RV S prime:     15.00 cm/s  IVC diam: 1.20 cm TAPSE (M-mode): 1.2 cm LEFT ATRIUM             Index        RIGHT ATRIUM           Index LA diam:        3.30 cm 1.95 cm/m   RA Area:     12.40 cm LA Vol (A2C):   39.5 ml 23.36 ml/m  RA Volume:   27.30 ml  16.14 ml/m LA Vol (A4C):   38.0 ml 22.47 ml/m LA Biplane Vol: 40.3 ml 23.83 ml/m  AORTIC VALVE LVOT Vmax:   111.00  cm/s LVOT Vmean:  70.400 cm/s LVOT VTI:    0.197 m  AORTA Ao Root diam: 3.50 cm Ao Asc diam:  2.80 cm MITRAL VALVE MV Area (PHT): 4.58 cm    SHUNTS MV Decel Time: 166 msec    Systemic VTI:  0.20 m MV E velocity: 88.25 cm/s  Systemic Diam: 2.30 cm MV A velocity: 76.55 cm/s MV E/A ratio:  1.15 Dorris Carnes MD Electronically signed by Dorris Carnes MD Signature Date/Time: 07/25/2022/11:45:28 AM    Final    [place] EKG SITE RITE  Result Date: 07/24/2022 If Site Rite image not attached, placement could not be confirmed due to current cardiac rhythm.  MR BRAIN W WO CONTRAST  Result Date: 07/23/2022 CLINICAL DATA:  Neuro deficit, stroke suspected, seizure disorder. EXAM: MRI HEAD WITHOUT AND WITH CONTRAST TECHNIQUE: Multiplanar, multiecho pulse sequences of the brain and surrounding structures were obtained without and with intravenous contrast. CONTRAST:  81m GADAVIST GADOBUTROL 1 MMOL/ML IV SOLN COMPARISON:  Brain MRI 12/15/2020 FINDINGS: Brain: There are numerous enhancing lesions in the brain most suspicious for metastatic disease: *0.7 cm x 0.6 cm lesion in the right middle frontal gyrus with mild surrounding edema (11-96). *0.9 cm x 0.7 cm lesion in the right corona radiata with mild surrounding edema and intralesional hemorrhage (11-91). *Punctate lesion in the right cingulate gyrus (11-93). *0.7 cm x 0.6 cm lesion in the right aspect of the splenium of the corpus callosum (11-87). *0.9 cm x 0.9 cm lesion in the right frontal lobe  white matter anteriorly with mild surrounding edema and intralesional hemorrhage (11-76). *0.4 cm lesion in the right anteroinferior frontal lobe with mild surrounding edema (11-63). *0.3 cm lesion in the right temporal lobe cortex inferiorly (11-44). *0.6 cm x 0.6 cm lesion in the right anterior temporal lobe with mild surrounding edema and intralesional hemorrhage (11-37). *Ill-defined 0.3 cm lesion in the left frontal opercular white matter (11-71). *1.1 cm x 1.0 cm lesion in the left temporal lobe laterally with mild surrounding edema and intralesional hemorrhage (11-61). *0.4 cm x 0.4 cm lesion in the left temporal lobe cortex anterolaterally (11-37). *2.4 cm x 1.6 cm lesion in the right cerebellar hemisphere with surrounding edema and intralesional hemorrhage (11-33). *1.3 cm x 0.8 cm lesion in the left cerebellar hemisphere with mild surrounding edema (11-36). *0.5 cm lesion in the inferomedial left cerebellar hemisphere (11-19). *Additional punctate lesions in the bilateral cerebellar hemispheres (11-17, 18). *No definite leptomeningeal disease is seen. *There is no significant mass effect caused by the above-described lesions or midline shift. There is no evidence of acute infarct. There is no acute intracranial hemorrhage or extra-axial fluid collection, aside from the intralesional hemorrhage described above. The pituitary and suprasellar region are normal. Vascular: Normal flow voids. Skull and upper cervical spine: Normal marrow signal. Sinuses/Orbits: The paranasal sinuses are clear. The globes and orbits are unremarkable. Other: There are solid lesions in the soft tissues of the upper neck measuring up to 2.0 cm x 1.5 cm on the right (3-6), 1.7 cm x 1.3 cm just to the right of midline posteriorly (3-9), and 1.2 cm x 1.1 cm to the left of midline (3-16), suspicious for pathologic lymph nodes. IMPRESSION: 1. Numerous enhancing lesions in the supratentorial and infratentorial brain as detailed above  consistent with metastatic disease. There is mild edema surrounding the larger lesions and some intralesional hemorrhage but no significant mass effect or midline shift. At least 16 lesions are identified. 2. Solid nodules in the soft tissues of  the imaged upper neck are indeterminate but suspicious for metastatic lymphadenopathy. Dedicated CT neck could be considered for better evaluation. Electronically Signed   By: Valetta Mole M.D.   On: 07/23/2022 11:18   Overnight EEG with video  Result Date: 07/23/2022 Lora Havens, MD     07/24/2022  9:35 AM Patient Name: YISELL HYERS MRN: [id number] Epilepsy Attending: Lora Havens Referring Physician/Provider: Derek Jack, MD Duration: 07/22/2022 [id number] to 07/23/2022 0941  Patient history: 62yo F with ams. EEG to evaluate for seizure  Level of alertness: lethargic  AEDs during EEG study: LEV  Technical aspects: This EEG study was done with scalp electrodes positioned according to the 10-20 International system of electrode placement. Electrical activity was reviewed with band pass filter of 1-'70Hz'$ , sensitivity of 7 uV/mm, display speed of 98m/sec with a '60Hz'$  notched filter applied as appropriate. EEG data were recorded continuously and digitally stored.  Video monitoring was available and reviewed as appropriate.  Description: No clear posterior dominant rhythm was seen. EEG showed continuous generalized  3 to 6 Hz theta-delta slowing. Generalized periodic discharges with triphasic morphology were also noted with fluctuating frequency of 0.5 to '2hz'$ , predominantly when awake/stimulated. Hyperventilation and photic stimulation were not performed.   Event button was pressed on 07/22/2022 at 1105. Patient was noted to be raising her ams. Concomitant eeg showed generalized periodic discharges with triphasic morphology at 0.5-'1hz'$  without definite evolution. Event button was pressed on 07/22/2022 at 1Fanshawe left arm was raised, had tremor like movements. Right amr was flexed  and abducted, also with tremor like movements.  Concomitant eeg showed generalized periodic discharges with triphasic morphology at 1-1.'5hz'$  without definite evolution. Event button was pressed on 07/22/2022 at 2305 for arm movements. Concomitant eeg showed generalized periodic discharges with triphasic morphology at 1-1.'5hz'$  without definite evolution.  ABNORMALITY - Periodic discharges with triphasic morphology, generalized ( GPDs) - Continuous slow, generalized  IMPRESSION: This study showed generalized periodic discharges with triphasic morphology which can be on the ictal-interictal continuum. However, the frequency, morphology and reactivity to stimulation is more commonly seen  secondary to toxic-metabolic causes like uremia, cefepime toxicity.  Additionally there is moderate to severe diffuse encephalopathy. No seizures were seen throughout the recording. Multiple events of arm movements were noted as described above without concomitant eeg change. These events were not epileptic. PLora Havens  EEG adult  Result Date: 07/21/2022 YLora Havens MD     07/21/2022 12:54 PM Patient Name: PJONELL BARGIELMRN: [id number] Attending: PLora HavensReferring Physician/Provider: GJonetta Osgood MD Date: 07/21/2022 Duration: 22.25 mins Patient history: 89yoF with ams. EEG to evaluate for seizure Level of alertness: Awake AEDs during EEG study: None Technical aspects: This EEG study was done with scalp electrodes positioned according to the 10-20 International system of electrode placement. Electrical activity was reviewed with band pass filter of 1-'70Hz'$ , sensitivity of 7 uV/mm, display speed of 353msec with a '60Hz'$  notched filter applied as appropriate. EEG data were recorded continuously and digitally stored.  Video monitoring was available and reviewed as appropriate. Description: No clear posterior dominant rhythm was seen. EEG showed continuous generalized  3 to 6 Hz theta-delta slowing.  Generalized periodic discharges with triphasic morphology were also noted  at  1-1.5 Hz. Hyperventilation and photic stimulation were not performed.   ABNORMALITY - Periodic discharges with triphasic morphology, generalized ( GPDs) - Continuous slow, generalized IMPRESSION: This study showed generalized periodic discharges with triphasic morphology which can be  on the ictal-interictal continuum. However, the frequency and morphology is more commonly seen  secondary to toxic-metabolic causes. Additionally there is moderate diffuse encephalopathy. No seizures or definite epileptiform discharges were seen throughout the recording. If concern for seizures persist, consider long term monitoring. Priyanka Barbra Sarks   DG CHEST PORT 1 VIEW  Result Date: 07/21/2022 CLINICAL DATA:  Tachypnea EXAM: PORTABLE CHEST 1 VIEW COMPARISON:  06/28/2022 FINDINGS: Right mid lung alveolar opacity consistent with atelectasis, consolidation or mass is a stable finding. No pleural effusion. No pneumothorax. Normal pulmonary vasculature. Unremarkable cardiac silhouette. IMPRESSION: Right mid lung alveolar process unchanged. Electronically Signed   By: Sammie Bench M.D.   On: 07/21/2022 10:28   DG THORACOLUMBAR SPINE  Result Date: 07/20/2022 CLINICAL DATA:  Elective surgery EXAM: THORACOLUMBAR SPINE 1V COMPARISON:  Preoperative imaging. FINDINGS: Cross-table lateral views of the thoracic spine obtained in the operating room. 1833 hour: Surgical instrument localizes posteriorly at the level of T10. 1843 hour: Surgical instruments localize posteriorly at the T9 level. IMPRESSION: Cross-table lateral views of the thoracolumbar spine obtained in the operating room for surgical localization. Electronically Signed   By: Keith Rake M.D.   On: 07/20/2022 20:21   MR Lumbar Spine W Wo Contrast  Result Date: 07/17/2022 CLINICAL DATA:  Initial evaluation for low back pain. History of metastatic disease. EXAM: MRI LUMBAR SPINE WITHOUT  AND WITH CONTRAST TECHNIQUE: Multiplanar and multiecho pulse sequences of the lumbar spine were obtained without and with intravenous contrast. CONTRAST:  71m GADAVIST GADOBUTROL 1 MMOL/ML IV SOLN COMPARISON:  Prior CT from earlier the same day. FINDINGS: Segmentation: Standard. Lowest well-formed disc space labeled the L5-S1 level. Alignment: Straightening of the normal lumbar lordosis. Trace degenerative anterolisthesis of L2 on L3. Vertebrae: Multiple osseous metastases seen involving the lumbar spine. These are seen involving the L1, L2, and L5 vertebral bodies. Largest of the lesion seen at L2 and measures 1.9 cm (series 29, image 10). No extra osseous or epidural extension of tumor. No pathologic compression fracture. Conus medullaris and cauda equina: Conus extends to the L1-2 level. Conus and cauda equina appear normal. No abnormal enhancement. Paraspinal and other soft tissues: Multiple scattered soft tissue and mesenteric metastatic implants seen throughout the visualized external soft tissues in abdomen. Presumed metastatic lesion involving the left kidney noted. Findings are better characterized on prior CT. Disc levels: L1-2: Normal interspace. Mild right-sided facet hypertrophy. No canal or foraminal stenosis. L2-3: Trace anterolisthesis. Disc desiccation with mild disc bulge. Moderate right with mild left facet hypertrophy. No spinal stenosis. Foramina remain adequately patent. L3-4: Disc desiccation with minimal disc bulge. Mild right greater than left facet hypertrophy. No spinal stenosis. Foramina remain adequately patent. L4-5: Disc desiccation with mild disc bulge. Superimposed shallow left subarticular disc protrusion mildly indents the left ventral thecal sac (series 31, image 28). Mild to moderate bilateral facet hypertrophy. No significant canal or lateral recess stenosis. Foramina remain patent. L5-S1:  Unremarkable. IMPRESSION: 1. Multiple osseous metastases involving the lumbar spine as  above, largest of which measures 1.9 cm at the L2 vertebral body. No extra osseous or epidural extension of tumor. No pathologic compression fracture. 2. Multiple scattered soft tissue and mesenteric metastatic implants, better characterized on prior CT. 3. Underlying mild for age multilevel degenerative disc disease and facet hypertrophy as above. No significant stenosis or overt neural impingement. Electronically Signed   By: BJeannine BogaM.D.   On: 06/25/2022 19:54   MR THORACIC SPINE W WO CONTRAST  Result Date: 07/18/2022 CLINICAL  DATA:  Initial evaluation for mid back pain. History of metastatic disease. EXAM: MRI THORACIC WITHOUT AND WITH CONTRAST TECHNIQUE: Multiplanar and multiecho pulse sequences of the thoracic spine were obtained without and with intravenous contrast. CONTRAST:  27m GADAVIST GADOBUTROL 1 MMOL/ML IV SOLN COMPARISON:  Prior CTs from earlier the same day as well as 07/01/2022. FINDINGS: Alignment: Physiologic with preservation of the normal thoracic kyphosis. No listhesis. Vertebrae: Osseous metastases involving the T8 and T9 vertebral bodies are seen. There is diffuse involvement of the T8 and T9 vertebral bodies. Extension into the posterior elements, greater at T9. Abnormal posterior convex bowing of the posterior vertebral margin. Associated extraosseous and epidural extension of tumor into the ventral epidural space at the level of T9. Associated severe spinal stenosis at this level, with the thecal sac measuring 4-5 mm in AP diameter at its most narrow point. Secondary cord flattening with subtle cord signal changes, concerning for edema and/or myelomalacia (series 3, image 10). Early epidural extension into the T9-10 and to a lesser extent the T8-9 neural foramina noted as well. Associated pathologic compression fractures of T8 and T9 noted as well, most pronounced at T9 or height loss measures up to approximately 50%. Additional heterogeneous T1/T2 hyperintense lesion at  T5 noted, favored to reflect a heterogeneous hemangioma. Few additional scattered subcentimeter benign hemangiomata noted. No other definite metastatic lesions. Cord: Severe spinal stenosis with cord compression at T9. Subtle cord signal changes concerning for edema and/or myelomalacia. No other abnormal intracanalicular enhancement. Paraspinal and other soft tissues: Multiple scattered soft tissue metastatic implants present throughout the posterior paraspinous soft tissues. Largest of these are seen near the left supraclavicular region and measure up to approximately 4 cm (series 4, image 1). Patient's known right upper lobe mass is partially visualized. Few additional scattered pulmonary nodules noted as well. Metastatic liver lesions partially visualize, better seen on prior CT. Disc levels: Multilevel degenerative spondylosis noted within the visualized cervical spine on counter sequence, but without high-grade spinal stenosis. No significant disc pathology seen within the thoracic spine itself for patient age. No other spinal stenosis. Foramina remain patent. IMPRESSION: 1. 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 Associated extraosseous and epidural extension of tumor into the ventral epidural space at the level of T9 with resultant severe spinal stenosis and cord compression. Subtle cord signal changes concerning for edema and/or myelomalacia. Emergent neuro surgical and/or radiation oncology consultation recommended. 2. Partial visualization of patient's known right upper lobe mass. Multiple metastatic soft tissue implants with additional hepatic metastases, better evaluated on prior CTs. 3. Heterogeneous lesion involving the T5 vertebral body, favored to reflect a hemangioma. Attention at follow-up recommended. Electronically Signed   By: BJeannine BogaM.D.   On: 07/03/2022 19:44   CT ABDOMEN PELVIS W CONTRAST  Result Date:  06/25/2022 CLINICAL DATA:  Abdominal pain, metastatic cancer not otherwise specified EXAM: CT ABDOMEN AND PELVIS WITH CONTRAST TECHNIQUE: Multidetector CT imaging of the abdomen and pelvis was performed using the standard protocol following bolus administration of intravenous contrast. RADIATION DOSE REDUCTION: This exam was performed according to the departmental dose-optimization program which includes automated exposure control, adjustment of the mA and/or kV according to patient size and/or use of iterative reconstruction technique. CONTRAST:  1069mOMNIPAQUE IOHEXOL 300 MG/ML  SOLN COMPARISON:  07/01/2022 FINDINGS: Lower chest: Stable subcentimeter bilateral lower lobe pulmonary nodules consistent with known metastatic disease. Trace pericardial effusion, with soft tissue density in the pericardium anteriorly measuring up to  1.8 cm, concerning for pericardial implant. Pleural base metastatic disease within the left anterior costophrenic sulcus measures 4.1 x 3.5 cm, previously measuring 3.3 x 2.8 cm. Hepatobiliary: Multiple hepatic hypodensities are identified, consistent with diffuse liver metastases. Largest in the right lobe reference image 11/2 measures 3.5 x 3.1 cm. There are multiple small noncalcified gallstones versus gallbladder polyps. No evidence of acute cholecystitis. No biliary duct dilation. Pancreas: There are multiple indeterminate hypodensities throughout the pancreatic parenchyma, measuring up to 1.4 cm in the pancreatic head and 1.3 cm in the pancreatic tail. No pancreatic duct dilation. No acute inflammatory change. Spleen: Normal in size without focal abnormality. Adrenals/Urinary Tract: There is a 3.2 x 2.9 cm hypodense mass within the posterior mid left kidney, either primary or metastatic disease. No urinary tract calculi or obstructive uropathy. The adrenals appear grossly normal. Bladder is unremarkable. Stomach/Bowel: There is no bowel obstruction or ileus. There are numerous  mesenteric masses which displaced loops of bowel, largest in the right mid abdomen measuring 8.4 x 6.6 cm, consistent with diffuse peritoneal metastases. Vascular/Lymphatic: Atherosclerosis throughout the aorta and its branches. There is no discrete adenopathy, though numerous soft tissue masses are seen throughout the subcutaneous tissues, mesentery, and retroperitoneum, consistent with diffuse metastatic disease. Reproductive: Status post hysterectomy. No adnexal masses. Other: There are multiple soft tissue masses throughout the subcutaneous tissues, mesentery, retroperitoneum, consistent with diffuse metastatic disease. Several index lesions seen on prior chest CT from earlier this month have enlarged, within the upper anterior abdominal wall reference image 19/2 measuring 2.9 x 2.3 cm, and within the left posterolateral chest wall image 1/2 measuring 3.2 x 2.5 cm. Largest mesenteric mass is described above. Largest mass in the lower pelvis right adnexal region measures 6.3 x 5.0 cm reference image 67/2. Trace pelvic free fluid.  No free intraperitoneal gas. Musculoskeletal: Stable metastatic disease is seen within the T8 and T9 vertebral bodies, with associated pathologic fractures. No retropulsion. Lucent lesions are also seen within the bilateral iliac bones consistent with additional metastatic foci. No new fractures since prior exam. Reconstructed images demonstrate no additional findings. IMPRESSION: 1. Diffuse progressive metastatic disease throughout the lower chest, abdomen, and pelvis. There are new and enlarging soft tissue masses throughout the lungs, pleura, subcutaneous tissues, mesentery, and retroperitoneum as described above. 2. Hypodense lesions throughout the liver, left kidney, and pancreas, consistent with diffuse metastatic disease. 3. Trace pericardial effusion, with suspected pericardial implants seen along the anterior pericardial border. 4. Multiple bony metastases as above, with stable  pathologic fractures of the T8 and T9 vertebral bodies. 5. Noncalcified gallstones versus gallbladder polyps. No evidence of acute cholecystitis. 6.  Aortic Atherosclerosis (ICD10-I70.0). Electronically Signed   By: Randa Ngo M.D.   On: 07/04/2022 17:58   DG Chest 2 View  Result Date: 07/15/2022 CLINICAL DATA:  Weakness. EXAM: CHEST - 2 VIEW COMPARISON:  Radiograph of May 25, 2022. CT scan of July 01, 2022. FINDINGS: Normal cardiac size. Lobular right perihilar mass is noted concerning for malignancy. There is associated atelectasis of the right upper lobe. Left lung is clear. Bony thorax is unremarkable. IMPRESSION: Lobular right perihilar mass is noted concerning for neoplasm or malignancy as noted on recent CT scan. There is associated subsegmental atelectasis of the right upper lobe. Electronically Signed   By: Marijo Conception M.D.   On: 06/27/2022 16:32   CT Angio Chest PE W and/or Wo Contrast  Result Date: 07/01/2022 CLINICAL DATA:  High probability for PE. Tachycardia and hypertension. EXAM: CT  ANGIOGRAPHY CHEST WITH CONTRAST TECHNIQUE: Multidetector CT imaging of the chest was performed using the standard protocol during bolus administration of intravenous contrast. Multiplanar CT image reconstructions and MIPs were obtained to evaluate the vascular anatomy. RADIATION DOSE REDUCTION: This exam was performed according to the departmental dose-optimization program which includes automated exposure control, adjustment of the mA and/or kV according to patient size and/or use of iterative reconstruction technique. CONTRAST:  36m OMNIPAQUE IOHEXOL 350 MG/ML SOLN COMPARISON:  CT heart 07/15/2019 FINDINGS: Cardiovascular: There is adequate opacification of the pulmonary arteries. There is no pulmonary embolism identified. The heart is mildly enlarged. There is a small pericardial effusion. Mediastinum/Nodes: There is a small hiatal hernia. The esophagus visualized thyroid gland are within normal  limits. There is new right hilar lymphadenopathy with largest lymph node measuring 1.7 x 1.7 cm. Lungs/Pleura: There is a new lobulated density in the right upper lobe abutting the right hilum measuring 2.9 x 4.0 x 2.2 cm. There is some adjacent atelectasis/airspace disease as well as airspace disease in the medial inferior right upper lobe. Additional right upper lobe pulmonary nodule measures 15 mm image 6/61. There is a right upper lobe nodule measuring 5 mm image 6/51. There is a right lower lobe pulmonary nodule measuring 3 mm image 6/80. Left upper lobe nodule image 6/87 measures 3 mm. Left upper lobe nodule image 6/38 measures 3 mm. There is some cutoff of the right upper lobe bronchus near the mass. Trachea is patent. No pleural effusion or pneumothorax. Upper Abdomen: Throughout the peritoneum. The largest is in the left upper quadrant in the gastrosplenic region measuring 2.4 x 2.8 cm. These are seen diffusely in the perirenal spaces, gastrohepatic region, anterior. There also numerous soft tissue nodules in the subcutaneous tissues of the abdominal wall diffusely. The largest is in the anterior left upper quadrant measuring 2.2 x 1.6 cm image 4/32. Musculoskeletal: Left axillary lymphadenopathy is new from prior with largest lymph node measuring 1.4 by 2.2 cm. There are additional diffuse subcutaneous body wall nodular densities throughout the chest with the largest inferior to the left scapular wing measuring 2.1 x 1.9 cm image 4/85. There is mottled appearance of the T9 vertebral body with mild compression deformity suspicious for pathologic fracture. There is also trace compression fracture of the superior endplate of T8. Review of the MIP images confirms the above findings. IMPRESSION: 1. No evidence for pulmonary embolism. 2. Right upper lobe mass measuring 4.0 cm worrisome for primary lung carcinoma. There is associated right hilar lymphadenopathy. 3. Additional bilateral pulmonary nodules measuring  up to 15 mm worrisome for metastatic disease. 4. Diffuse metastatic disease throughout the peritoneum and subcutaneous tissues of the chest and abdominal wall. 5. Mottled appearance of the T9 vertebral body with mild compression deformity suspicious for pathologic fracture. There is also trace compression fracture of the superior endplate of T8. 6. Small pericardial effusion. Electronically Signed   By: ARonney AstersM.D.   On: 07/01/2022 16:49    Microbiology: Recent Results (from the past 240 hour(s))  Surgical pcr screen     Status: None   Collection Time: 07/20/22  4:59 PM   Specimen: Nasal Mucosa; Nasal Swab  Result Value Ref Range Status   MRSA, PCR NEGATIVE NEGATIVE Final   Staphylococcus aureus NEGATIVE NEGATIVE Final    Comment: (NOTE) The Xpert SA Assay (FDA approved for NASAL specimens in patients 25years of age and older), is one component of a comprehensive surveillance program. It is not intended to diagnose  infection nor to guide or monitor treatment. Performed at [facility], [place] [address], [place], [place] [address]   Culture, blood (Routine X 2) w Reflex to ID Panel     Status: None   Collection Time: 07/21/22 11:26 AM   Specimen: BLOOD LEFT ARM  Result Value Ref Range Status   Specimen Description BLOOD LEFT ARM  Final   Special Requests   Final    BOTTLES DRAWN AEROBIC AND ANAEROBIC Blood Culture adequate volume   Culture   Final    NO GROWTH 5 DAYS Performed at [facility], [place] [address] [place], [place] [address]    Report Status 07/26/2022 FINAL  Final  Culture, blood (Routine X 2) w Reflex to ID Panel     Status: None   Collection Time: 07/21/22 11:38 AM   Specimen: BLOOD LEFT HAND  Result Value Ref Range Status   Specimen Description BLOOD LEFT HAND  Final   Special Requests   Final    BOTTLES DRAWN AEROBIC AND ANAEROBIC Blood Culture adequate volume   Culture   Final    NO GROWTH 5 DAYS Performed at [facility], [place]  [address], [place], [place] [address]    Report Status 07/26/2022 FINAL  Final  Urine Culture     Status: None   Collection Time: 07/21/22  1:11 PM   Specimen: Urine, Random  Result Value Ref Range Status   Specimen Description URINE, RANDOM  Final   Special Requests NONE Reflexed from [id number]  Final   Culture   Final    NO GROWTH Performed at [facility], [place] [address], [place], [place] [address]    Report Status 07/23/2022 FINAL  Final    Time spent: 35 minutes  Signed: Oren Binet, MD 08/03/2022

## 2022-08-22 NOTE — Plan of Care (Signed)
1456 I was requested by patient's day shift RN, Percell Miller, to dual-verify that patient has expired. On assessment, patient had no heart sounds, no pulses, no signes of spontaneous respiration. Patient expired at 1456. Family members at the bedside.

## 2022-08-22 NOTE — Progress Notes (Signed)
   Palliative Medicine Inpatient Follow Up Note   HPI: 62 y.o. female  with past medical history of metastatic melanoma, COPD, left bundle branch block, tachycardia, chronic pain syndrome, ADHD, GERD, IBS admitted on August 16, 2022 with metastatic cord compression s/p surgical resection of tumor at T8-T9 on 07/20/22. Hospitalization complicated by new finding of brain metastasis with symptoms of confusion and seizures.        Today's Discussion 07/28/2022  *Please note that this is a verbal dictation therefore any spelling or grammatical errors are due to the "[facility]" system interpretation.  Chart reviewed inclusive of vital signs, progress notes, laboratory results, and diagnostic images.   I met this morning with patients RNs Dominica and Lorre Nick. Reviewed that patient was getting close to EOL and a NRB FM was applied overnight by nursing to enable family the opportunity to arrive. The plan this morning is to remove it once everyone gets to bedside.  I met at bedside with patients half sister and youngest son, Saralyn Pilar. We reviewed that patient is in the transitional phases of the dying process and I suspect her time will be short. The patients family was understanding of this. They share that additional family members are coming to celebrate Yomayra's life and support one another during this time.   Laiylah is more comfortable today in terms of her secretion burden. She has [place] respirations. Extremities are mottled. No nonverbal s/s of distress noted.  I shared with patients family it is likely she will pass today. Empathetic support provided. "Gone From My Sight" book given to them.  Questions and concerns addressed/Palliative Support Provided.   Objective Assessment: Vital Signs Vitals:   07/29/22 2048 08/11/2022 0505  BP: (!) 77/42   Pulse: (!) 138 (!) 116  Resp: (!) 24   Temp:    SpO2:  93%    Intake/Output Summary (Last 24 hours) at 07/27/2022 3009 Last data filed at  07/29/2022 0900 Gross per 24 hour  Intake 0 ml  Output --  Net 0 ml    Last Weight  Most recent update: 07/28/2022  5:14 AM    Weight  68.1 kg (150 lb 2.1 oz)            Gen: Frail elderly caucasian F in severe distress HEENT: Dry mucous membranes CV: Irregular rate and rhythm  PULM: (+) Secretions - audible, breathing rapid ABD: soft/nontender  EXT: Generalized edema  Neuro: Opens eyes but does not respond  SUMMARY OF RECOMMENDATIONS   DNAR/DNI  Comfort Care  Wean O2 --> FM to be removed once family arrives  Added comfort medications --> Scopolamine patch, increase dilaudid gtt rate, ativan 1mg  IVP ATC, glycopyrrolate 0.4mg  IVP Q4H ATC  Anticipate in hospital death in oncoming hours   Ongoing PMT support  Billing based on MDM: High ______________________________________________________________________________________ [place] Team Team Cell Phone: [phone] Please utilize secure chat with additional questions, if there is no response within 30 minutes please call the above phone number  Palliative Medicine Team providers are available by phone from 7am to 7pm daily and can be reached through the team cell phone.  Should this patient require assistance outside of these hours, please call the patient's attending physician.

## 2022-08-22 NOTE — Progress Notes (Signed)
PROGRESS NOTE        PATIENT DETAILS Name: Theresa Pace Age: 62 y.o. Sex: female Date of Birth: 1960-06-29 Admit Date: 07/04/2022 Admitting Physician Earnie Larsson, MD [id number], [facility]  Brief Summary: 62 year old with history of metastatic melanoma, COPD, chronic pain syndrome, ADHD-who is currently undergoing immunotherapy for metastatic melanoma-presented to the hospital with worsening back pain and lower extremity weakness-she evaluated with an MRI which showed pathological T8/T9 fractures and epidural extension of the tumor-she was taken to the OR on 2/28 for decompression by neurosurgery. TRH was consulted on 2/29 for evaluation of confusion-on 3/1-patient was found to have breakthrough seizures.  See below for further details.   Significant events: 2/27>> admit by neurosurgery 2/28>> T8-T9 decompressive laminectomy with resection of tumor 2/29>> consult to [facility] assumed primary service. 3/01>> seizure-like activity-neurology consulted. 3/06>> discussed with radiation oncology-increase Decadron-if no improvement-okay for comfort care. 3/07>> transitioned comfort care  Significant studies: 2/27>> CT abdomen/pelvis: Diffuse progressive metastatic disease throughout lower chest/abdomen/pelvis. 2/27>> MRI T-spine: T8-T9 vertebral bodies compression fracture, extraosseous/epidural extension into the ventral epidural space at T9 with resultant severe spinal stenosis/cord compression. 2/27>> MRI L-spine: Multiple osseous mets involving lumbar spine, no extraosseous or epidural extension of tumor. 2/29>> CXR: Right mid lung alveolar process unchanged. 3/1-3/2>>LTM  EEG: No seizures. 3/02>> MRI brain: Numerous enhancing lesions in the supratentorial and infratentorial brain-consistent with metastatic disease. 3/04>> echo: EF 55-60%  Significant microbiology data: 2/27>> COVID/influenza/RSV PCR: Negative 2/29>> blood culture: No  growth  Procedures: 2/28>> T8-T9 decompressive laminectomy with resection of tumor  Consults: Neurology Neurosurgery  Subjective: Unresponsive-having apneic spells.  Sister/son at bedside.  Objective: Vitals: Blood pressure (!) 51/18, pulse (!) 115, temperature 98.3 F (36.8 C), temperature source Oral, resp. rate 13, height '5\' 2"'$  (1.575 m), weight 68.1 kg, SpO2 95 %.   Exam: Comfortable-barely responding  Pertinent Labs/Radiology:    Latest Ref Rng & Units 07/28/2022    4:41 AM 07/27/2022   11:11 AM 07/26/2022    4:52 AM  CBC  WBC 4.0 - 10.5 K/uL 16.0  17.6  17.9   Hemoglobin 12.0 - 15.0 g/dL 9.3  9.9  10.3   Hematocrit 36.0 - 46.0 % 30.0  30.5  31.2   Platelets 150 - 400 K/uL 299  373  453     Lab Results  Component Value Date   NA 136 07/28/2022   K 5.4 (H) 07/28/2022   CL 105 07/28/2022   CO2 19 (L) 07/28/2022     Assessment/Plan: Metastatic melanoma with T8/T9 pathologic fracture  Cord compression at T8-T9 level Epidural tumor with myelopathy S/p T8-T9 decompressive laminectomy with resection of tumor on 2/28 Widespread metastatic melanoma with brain mets Postoperative care was managed by neurosurgery Unfortunately appears to have a significant metastatic burden-remained persistently encephalopathic with severe failure to thrive syndrome postoperatively-no response to numerous changes in medications.  No response to high-dose Decadron as recommended by radiation oncology.  No significant oral intake since admission. After extensive discussion with family-has been transitioned to full comfort measures on 3/7.  Anticipating in-hospital death at this point.  Acute toxic metabolic encephalopathy Breakthrough seizure Initially thought to have acute encephalopathy in the setting of postop status/narcotics/UTI-however had seizure-like activity on 3/1-and subsequently started on IV Keppra.  LTM EEG was negative for seizures-once MRI brain showed multiple mets-neurology  recommended palliative care/hospice care. Due to prolonged  QTc-unable to use Haldol/Seroquel Per nursing staff-restlessness/encephalopathy at times responds to narcotics.  After extensive discussion with family-has been transitioned to full comfort measures on 3/7.  Hypokalemia/hypomagnesemia Repleted.  Prolonged QTc Plan was to keep keep K>4. Mg>2 Avoid QTc prolonging agents  Normocytic anemia Likely due to acute illness/underlying malignancy  COPD Not in exacerbation Continue bronchodilators   Complicated UTI Completed course of IV Rocephin  ?  PAF/MAT Did have few episodes of narrow complex tachycardia Echo stable EF. Not safe for anticoagulation given recent thoracic spine surgery.    HLD Statin   Chronic pain syndrome IV Dilaudid for severe pain, Norco for moderate pain On chronic narcotics at home   HTN Relatively stable Was on Cardizem/IV metoprolol  ADHD/fibromyalgia/depression Trazodone held for prolonged QTc Holding Adderall  Failure to thrive syndrome Palliative care Per spouse-had decreased oral intake given prior to this hospitalization Per nursing staff-no significant oral intake over the past several days due to severe encephalopathy S/p Cortrak tube placement on 3/4-continue feedings for now.  DNR in place Very poor overall prognosis-at failure to thrive syndrome even prior to this hospitalization but unfortunately postoperatively developed severe encephalopathy-after extensive discussion with family by this MD and the palliative care team-has been transitioned to full comfort measures.  Having frequent apneic spells-and not felt stable to be transferred to residential hospice-anticipate inpatient death at this point.  Nutrition Status: Nutrition Problem: Moderate Malnutrition Etiology: chronic illness (metastatic melanoma) Signs/Symptoms: mild fat depletion, moderate fat depletion, moderate muscle depletion, severe muscle depletion Interventions:  Tube feeding    BMI: Estimated body mass index is 27.46 kg/m as calculated from the following:   Height as of this encounter: '5\' 2"'$  (1.575 m).   Weight as of this encounter: 68.1 kg.   Code status:   Code Status: DNR   DVT Prophylaxis:    Family Communication:  Sister/son at bedside on 3/9 Spouse-Riley-[phone]-updated over the phone on 3/7.   Disposition Plan: Status is: Inpatient Remains inpatient appropriate because: Severity of illness   Planned Discharge Destination:Skilled nursing facility   Diet: Diet Order             DIET - DYS 1 Room service appropriate? No; Fluid consistency: Thin  Diet effective now                     Antimicrobial agents: Anti-infectives (From admission, onward)    Start     Dose/Rate Route Frequency Ordered Stop   07/22/22 0915  cefTRIAXone (ROCEPHIN) 2 g in sodium chloride 0.9 % 100 mL IVPB        2 g 200 mL/hr over 30 Minutes Intravenous Every 24 hours 07/21/22 1016 07/24/22 1034   07/21/22 0915  cefTRIAXone (ROCEPHIN) 1 g in sodium chloride 0.9 % 100 mL IVPB  Status:  Discontinued        1 g 200 mL/hr over 30 Minutes Intravenous Every 24 hours 07/21/22 0821 07/21/22 1016   07/20/22 2215  ceFAZolin (ANCEF) IVPB 1 g/50 mL premix        1 g 100 mL/hr over 30 Minutes Intravenous Every 8 hours 07/20/22 2118 07/21/22 0610   07/20/22 1007  ceFAZolin (ANCEF) IVPB 2g/100 mL premix        2 g 200 mL/hr over 30 Minutes Intravenous 30 min pre-op 07/20/22 1007 07/20/22 1810        MEDICATIONS: Scheduled Meds:  atropine  2 drop Sublingual QID   glycopyrrolate  0.4 mg Intravenous Q4H   LORazepam  1 mg Intravenous Q4H   scopolamine  1 patch Transdermal Q72H   sodium chloride flush  10-40 mL Intracatheter Q12H   Continuous Infusions:  sodium chloride 10 mL/hr at 07/28/22 1146   HYDROmorphone 3 mg/hr (08/02/2022 0929)   levETIRAcetam 500 mg (07/29/2022 0925)   methocarbamol (ROBAXIN) IV Stopped (07/24/22 1630)   PRN  Meds:.acetaminophen **OR** acetaminophen, albuterol, antiseptic oral rinse, bisacodyl, HYDROmorphone, LORazepam, methocarbamol (ROBAXIN) IV, metoprolol tartrate, ondansetron (ZOFRAN) IV **OR** ondansetron, polyvinyl alcohol, sodium chloride flush   I have personally reviewed following labs and imaging studies  LABORATORY DATA: CBC: Recent Labs  Lab 07/24/22 0751 07/25/22 0757 07/26/22 0452 07/27/22 1111 07/28/22 0441  WBC 12.9* 18.9* 17.9* 17.6* 16.0*  HGB 9.5* 8.9* 10.3* 9.9* 9.3*  HCT 29.4* 27.2* 31.2* 30.5* 30.0*  MCV 93.0 91.6 92.3 93.8 96.5  PLT 383 471* 453* 373 299     Basic Metabolic Panel: Recent Labs  Lab 07/24/22 0751 07/25/22 0541 07/25/22 0757 07/26/22 0452 07/27/22 0449 07/28/22 0441  NA 141  --  135 134* 136 136  K 3.8  --  3.2* 5.0 5.1 5.4*  CL 113*  --  104 106 104 105  CO2 17*  --  21* 16* 22 19*  GLUCOSE 104*  --  154* 190* 142* 170*  BUN 16  --  '11 10 13 17  '$ CREATININE 0.60  --  0.45 0.74 0.53 0.58  CALCIUM 7.6*  --  7.7* 7.8* 8.6* 8.1*  MG  --  1.9 1.7 2.2 2.2 2.2  PHOS  --   --  1.6* 2.6 2.7  --      GFR: Estimated Creatinine Clearance: 67.6 mL/min (by C-G formula based on SCr of 0.58 mg/dL).  Liver Function Tests: Recent Labs  Lab 07/25/22 0757  AST 24  ALT 16  ALKPHOS 72  BILITOT 0.6  PROT 5.3*  ALBUMIN 2.2*    No results for input(s): "LIPASE", "AMYLASE" in the last 168 hours. No results for input(s): "AMMONIA" in the last 168 hours.   Coagulation Profile: No results for input(s): "INR", "PROTIME" in the last 168 hours.  Cardiac Enzymes: No results for input(s): "CKTOTAL", "CKMB", "CKMBINDEX", "TROPONINI" in the last 168 hours.  BNP (last 3 results) No results for input(s): "PROBNP" in the last 8760 hours.  Lipid Profile: No results for input(s): "CHOL", "HDL", "LDLCALC", "TRIG", "CHOLHDL", "LDLDIRECT" in the last 72 hours.  Thyroid Function Tests: No results for input(s): "TSH", "T4TOTAL", "FREET4", "T3FREE",  "THYROIDAB" in the last 72 hours.  Anemia Panel: No results for input(s): "VITAMINB12", "FOLATE", "FERRITIN", "TIBC", "IRON", "RETICCTPCT" in the last 72 hours.   Urine analysis:    Component Value Date/Time   COLORURINE YELLOW 07/21/2022 1311   APPEARANCEUR CLEAR 07/21/2022 1311   LABSPEC >1.030 (H) 07/21/2022 1311   PHURINE 5.5 07/21/2022 1311   GLUCOSEU NEGATIVE 07/21/2022 1311   HGBUR NEGATIVE 07/21/2022 1311   BILIRUBINUR NEGATIVE 07/21/2022 1311   KETONESUR 15 (A) 07/21/2022 1311   PROTEINUR 30 (A) 07/21/2022 1311   UROBILINOGEN 0.2 02/21/2012 0055   NITRITE NEGATIVE 07/21/2022 1311   LEUKOCYTESUR NEGATIVE 07/21/2022 1311    Sepsis Labs: Lactic Acid, Venous    Component Value Date/Time   LATICACIDVEN 1.5 07/21/2022 1416    MICROBIOLOGY: Recent Results (from the past 240 hour(s))  Surgical pcr screen     Status: None   Collection Time: 07/20/22  4:59 PM   Specimen: Nasal Mucosa; Nasal Swab  Result Value Ref Range Status   MRSA, PCR NEGATIVE  NEGATIVE Final   Staphylococcus aureus NEGATIVE NEGATIVE Final    Comment: (NOTE) The Xpert SA Assay (FDA approved for NASAL specimens in patients 45 years of age and older), is one component of a comprehensive surveillance program. It is not intended to diagnose infection nor to guide or monitor treatment. Performed at [facility], [place] [address] [place], [place] [address]   Culture, blood (Routine X 2) w Reflex to ID Panel     Status: None   Collection Time: 07/21/22 11:26 AM   Specimen: BLOOD LEFT ARM  Result Value Ref Range Status   Specimen Description BLOOD LEFT ARM  Final   Special Requests   Final    BOTTLES DRAWN AEROBIC AND ANAEROBIC Blood Culture adequate volume   Culture   Final    NO GROWTH 5 DAYS Performed at [facility], [place] [address] [place], [place] [address]    Report Status 07/26/2022 FINAL  Final  Culture, blood (Routine X 2) w Reflex to ID Panel     Status: None   Collection  Time: 07/21/22 11:38 AM   Specimen: BLOOD LEFT HAND  Result Value Ref Range Status   Specimen Description BLOOD LEFT HAND  Final   Special Requests   Final    BOTTLES DRAWN AEROBIC AND ANAEROBIC Blood Culture adequate volume   Culture   Final    NO GROWTH 5 DAYS Performed at [facility], [place] [address], [place], [place] [address]    Report Status 07/26/2022 FINAL  Final  Urine Culture     Status: None   Collection Time: 07/21/22  1:11 PM   Specimen: Urine, Random  Result Value Ref Range Status   Specimen Description URINE, RANDOM  Final   Special Requests NONE Reflexed from [id number]  Final   Culture   Final    NO GROWTH Performed at [facility], [place] [address] [place], [place] [address]    Report Status 07/23/2022 FINAL  Final    RADIOLOGY STUDIES/RESULTS: No results found.   LOS: 11 days   Oren Binet, MD  [facility]    To contact the attending provider between 7A-7P or the covering provider during after hours 7P-7A, please log into the web site [email or web address] and access using universal [place] password for that web site. If you do not have the password, please call the hospital operator.  08/14/2022, 10:23 AM

## 2022-08-22 NOTE — Progress Notes (Signed)
67m of hydromorphone IV drip wasted in bin. MDelrae Rend RN as witness. Unable to waste in Pyxis because the medication was sent secure via tube station.

## 2022-08-22 NOTE — Progress Notes (Signed)
Patient passed peacefully at 50 with family at bedside. Patient belongings sent home with family. Post mortem care and documentation complete.

## 2022-08-22 DEATH — deceased

## 2022-09-20 ENCOUNTER — Encounter (HOSPITAL_COMMUNITY): Payer: Medicaid Other
